# Patient Record
Sex: Female | Born: 1945
Health system: Southern US, Community
[De-identification: ages and names within clinical notes are randomized; demographics above are authoritative.]

## PROBLEM LIST (undated history)

## (undated) DIAGNOSIS — L821 Other seborrheic keratosis: Secondary | ICD-10-CM

## (undated) DIAGNOSIS — B269 Mumps without complication: Secondary | ICD-10-CM

## (undated) DIAGNOSIS — E1165 Type 2 diabetes mellitus with hyperglycemia: Secondary | ICD-10-CM

## (undated) DIAGNOSIS — R739 Hyperglycemia, unspecified: Secondary | ICD-10-CM

## (undated) DIAGNOSIS — B059 Measles without complication: Secondary | ICD-10-CM

## (undated) DIAGNOSIS — T7840XA Allergy, unspecified, initial encounter: Secondary | ICD-10-CM

## (undated) DIAGNOSIS — I10 Essential (primary) hypertension: Secondary | ICD-10-CM

## (undated) DIAGNOSIS — J189 Pneumonia, unspecified organism: Secondary | ICD-10-CM

## (undated) DIAGNOSIS — D649 Anemia, unspecified: Secondary | ICD-10-CM

## (undated) DIAGNOSIS — Z5189 Encounter for other specified aftercare: Secondary | ICD-10-CM

## (undated) DIAGNOSIS — E1169 Type 2 diabetes mellitus with other specified complication: Secondary | ICD-10-CM

## (undated) DIAGNOSIS — J019 Acute sinusitis, unspecified: Secondary | ICD-10-CM

## (undated) DIAGNOSIS — R21 Rash and other nonspecific skin eruption: Secondary | ICD-10-CM

## (undated) DIAGNOSIS — B019 Varicella without complication: Secondary | ICD-10-CM

## (undated) DIAGNOSIS — E669 Obesity, unspecified: Secondary | ICD-10-CM

## (undated) DIAGNOSIS — J45909 Unspecified asthma, uncomplicated: Secondary | ICD-10-CM

## (undated) DIAGNOSIS — Z0000 Encounter for general adult medical examination without abnormal findings: Secondary | ICD-10-CM

## (undated) DIAGNOSIS — E785 Hyperlipidemia, unspecified: Secondary | ICD-10-CM

## (undated) DIAGNOSIS — I81 Portal vein thrombosis: Secondary | ICD-10-CM

## (undated) DIAGNOSIS — K219 Gastro-esophageal reflux disease without esophagitis: Secondary | ICD-10-CM

## (undated) DIAGNOSIS — J4520 Mild intermittent asthma, uncomplicated: Secondary | ICD-10-CM

## (undated) HISTORY — DX: Hyperglycemia, unspecified: R73.9

## (undated) HISTORY — DX: Type 2 diabetes mellitus with other specified complication: E11.69

## (undated) HISTORY — DX: Mild intermittent asthma, uncomplicated: J45.20

## (undated) HISTORY — DX: Varicella without complication: B01.9

## (undated) HISTORY — DX: Mumps without complication: B26.9

## (undated) HISTORY — PX: BREAST SURGERY: SHX581

## (undated) HISTORY — DX: Obesity, unspecified: E66.9

## (undated) HISTORY — DX: Hyperlipidemia, unspecified: E78.5

## (undated) HISTORY — DX: Other seborrheic keratosis: L82.1

## (undated) HISTORY — PX: KNEE ARTHROSCOPY W/ MENISCAL REPAIR: SHX1877

## (undated) HISTORY — DX: Portal vein thrombosis: I81

## (undated) HISTORY — DX: Allergy, unspecified, initial encounter: T78.40XA

## (undated) HISTORY — PX: BREAST BIOPSY: SHX20

## (undated) HISTORY — PX: OTHER SURGICAL HISTORY: SHX169

## (undated) HISTORY — DX: Unspecified asthma, uncomplicated: J45.909

## (undated) HISTORY — DX: Rash and other nonspecific skin eruption: R21

## (undated) HISTORY — PX: TONSILLECTOMY: SUR1361

## (undated) HISTORY — DX: Essential (primary) hypertension: I10

## (undated) HISTORY — DX: Encounter for other specified aftercare: Z51.89

## (undated) HISTORY — DX: Measles without complication: B05.9

## (undated) HISTORY — DX: Anemia, unspecified: D64.9

## (undated) HISTORY — DX: Type 2 diabetes mellitus with hyperglycemia: E11.65

## (undated) HISTORY — DX: Gastro-esophageal reflux disease without esophagitis: K21.9

## (undated) HISTORY — DX: Acute sinusitis, unspecified: J01.90

## (undated) HISTORY — DX: Encounter for general adult medical examination without abnormal findings: Z00.00

---

## 1998-11-03 ENCOUNTER — Emergency Department (HOSPITAL_COMMUNITY): Admission: EM | Admit: 1998-11-03 | Discharge: 1998-11-03 | Payer: Self-pay | Admitting: Emergency Medicine

## 1998-12-04 ENCOUNTER — Encounter: Payer: Self-pay | Admitting: Family Medicine

## 1998-12-04 ENCOUNTER — Ambulatory Visit (HOSPITAL_COMMUNITY): Admission: RE | Admit: 1998-12-04 | Discharge: 1998-12-04 | Payer: Self-pay | Admitting: Family Medicine

## 1999-01-22 ENCOUNTER — Ambulatory Visit (HOSPITAL_COMMUNITY): Admission: RE | Admit: 1999-01-22 | Discharge: 1999-01-22 | Payer: Self-pay | Admitting: Family Medicine

## 1999-01-22 ENCOUNTER — Encounter: Payer: Self-pay | Admitting: Family Medicine

## 1999-07-30 ENCOUNTER — Encounter: Admission: RE | Admit: 1999-07-30 | Discharge: 1999-07-30 | Payer: Self-pay | Admitting: Family Medicine

## 1999-07-30 ENCOUNTER — Encounter: Payer: Self-pay | Admitting: Family Medicine

## 2001-11-12 ENCOUNTER — Emergency Department (HOSPITAL_COMMUNITY): Admission: EM | Admit: 2001-11-12 | Discharge: 2001-11-12 | Payer: Self-pay | Admitting: Emergency Medicine

## 2005-12-31 ENCOUNTER — Encounter: Admission: RE | Admit: 2005-12-31 | Discharge: 2005-12-31 | Payer: Self-pay | Admitting: Orthopedic Surgery

## 2006-02-21 ENCOUNTER — Encounter: Admission: RE | Admit: 2006-02-21 | Discharge: 2006-02-21 | Payer: Self-pay | Admitting: Orthopedic Surgery

## 2007-07-15 ENCOUNTER — Encounter: Admission: RE | Admit: 2007-07-15 | Discharge: 2007-07-15 | Payer: Self-pay | Admitting: Orthopedic Surgery

## 2007-09-27 ENCOUNTER — Other Ambulatory Visit: Admission: RE | Admit: 2007-09-27 | Discharge: 2007-09-27 | Payer: Self-pay | Admitting: Family Medicine

## 2008-04-13 LAB — HM PAP SMEAR

## 2008-04-13 LAB — HM MAMMOGRAPHY

## 2010-10-09 ENCOUNTER — Inpatient Hospital Stay (HOSPITAL_COMMUNITY)
Admission: EM | Admit: 2010-10-09 | Discharge: 2010-10-18 | DRG: 871 | Disposition: A | Discharge status: Home Health | Payer: Medicare Other | Attending: Internal Medicine | Admitting: Internal Medicine

## 2010-10-09 DIAGNOSIS — D638 Anemia in other chronic diseases classified elsewhere: Secondary | ICD-10-CM | POA: Diagnosis present

## 2010-10-09 DIAGNOSIS — N179 Acute kidney failure, unspecified: Secondary | ICD-10-CM | POA: Diagnosis present

## 2010-10-09 DIAGNOSIS — R609 Edema, unspecified: Secondary | ICD-10-CM | POA: Diagnosis not present

## 2010-10-09 DIAGNOSIS — K5732 Diverticulitis of large intestine without perforation or abscess without bleeding: Secondary | ICD-10-CM | POA: Diagnosis present

## 2010-10-09 DIAGNOSIS — D259 Leiomyoma of uterus, unspecified: Secondary | ICD-10-CM | POA: Diagnosis present

## 2010-10-09 DIAGNOSIS — R748 Abnormal levels of other serum enzymes: Secondary | ICD-10-CM | POA: Diagnosis present

## 2010-10-09 DIAGNOSIS — J45909 Unspecified asthma, uncomplicated: Secondary | ICD-10-CM | POA: Diagnosis present

## 2010-10-09 DIAGNOSIS — K449 Diaphragmatic hernia without obstruction or gangrene: Secondary | ICD-10-CM | POA: Diagnosis present

## 2010-10-09 DIAGNOSIS — K8020 Calculus of gallbladder without cholecystitis without obstruction: Secondary | ICD-10-CM | POA: Diagnosis present

## 2010-10-09 DIAGNOSIS — R791 Abnormal coagulation profile: Secondary | ICD-10-CM | POA: Diagnosis present

## 2010-10-09 DIAGNOSIS — I959 Hypotension, unspecified: Secondary | ICD-10-CM | POA: Diagnosis present

## 2010-10-09 DIAGNOSIS — E869 Volume depletion, unspecified: Secondary | ICD-10-CM | POA: Diagnosis present

## 2010-10-09 DIAGNOSIS — I81 Portal vein thrombosis: Secondary | ICD-10-CM | POA: Diagnosis present

## 2010-10-09 DIAGNOSIS — D509 Iron deficiency anemia, unspecified: Secondary | ICD-10-CM | POA: Diagnosis present

## 2010-10-09 DIAGNOSIS — E876 Hypokalemia: Secondary | ICD-10-CM | POA: Diagnosis not present

## 2010-10-09 DIAGNOSIS — A419 Sepsis, unspecified organism: Principal | ICD-10-CM | POA: Diagnosis present

## 2010-10-09 DIAGNOSIS — E119 Type 2 diabetes mellitus without complications: Secondary | ICD-10-CM | POA: Diagnosis present

## 2010-10-09 DIAGNOSIS — I10 Essential (primary) hypertension: Secondary | ICD-10-CM | POA: Diagnosis present

## 2010-10-09 DIAGNOSIS — E46 Unspecified protein-calorie malnutrition: Secondary | ICD-10-CM | POA: Diagnosis present

## 2010-10-09 LAB — COMPREHENSIVE METABOLIC PANEL
AST: 35 U/L (ref 0–37)
BUN: 25 mg/dL — ABNORMAL HIGH (ref 6–23)
CO2: 21 mEq/L (ref 19–32)
Calcium: 9.4 mg/dL (ref 8.4–10.5)
Chloride: 97 mEq/L (ref 96–112)
Creatinine, Ser: 1.45 mg/dL — ABNORMAL HIGH (ref 0.50–1.10)
GFR calc Af Amer: 44 mL/min — ABNORMAL LOW (ref >60)
GFR calc non Af Amer: 36 mL/min — ABNORMAL LOW (ref >60)
Glucose, Bld: 160 mg/dL — ABNORMAL HIGH (ref 70–99)
Total Bilirubin: 1 mg/dL (ref 0.3–1.2)

## 2010-10-09 LAB — CBC
MCH: 28.1 pg (ref 26.0–34.0)
MCV: 83.5 fL (ref 78.0–100.0)
Platelets: 288 10*3/uL (ref 150–400)
RDW: 13.8 % (ref 11.5–15.5)
WBC: 23 10*3/uL — ABNORMAL HIGH (ref 4.0–10.5)

## 2010-10-09 LAB — DIFFERENTIAL
Basophils Absolute: 0 10*3/uL (ref 0.0–0.1)
Eosinophils Absolute: 0 10*3/uL (ref 0.0–0.7)
Lymphocytes Relative: 5 % — ABNORMAL LOW (ref 12–46)
Monocytes Relative: 6 % (ref 3–12)
Neutro Abs: 20.4 10*3/uL — ABNORMAL HIGH (ref 1.7–7.7)
Neutrophils Relative %: 89 % — ABNORMAL HIGH (ref 43–77)

## 2010-10-10 ENCOUNTER — Inpatient Hospital Stay (HOSPITAL_COMMUNITY): Payer: Medicare Other

## 2010-10-10 LAB — URINALYSIS, ROUTINE W REFLEX MICROSCOPIC
Bilirubin Urine: NEGATIVE
Leukocytes, UA: NEGATIVE
Nitrite: NEGATIVE
Specific Gravity, Urine: 1.007 (ref 1.005–1.030)
Urobilinogen, UA: 1 mg/dL (ref 0.0–1.0)

## 2010-10-10 LAB — COMPREHENSIVE METABOLIC PANEL
ALT: 30 U/L (ref 0–35)
AST: 25 U/L (ref 0–37)
Albumin: 2 g/dL — ABNORMAL LOW (ref 3.5–5.2)
Calcium: 7.9 mg/dL — ABNORMAL LOW (ref 8.4–10.5)
Sodium: 137 mEq/L (ref 135–145)
Total Protein: 5.1 g/dL — ABNORMAL LOW (ref 6.0–8.3)

## 2010-10-10 LAB — CBC
MCH: 28.3 pg (ref 26.0–34.0)
MCHC: 33.7 g/dL (ref 30.0–36.0)
Platelets: 231 10*3/uL (ref 150–400)

## 2010-10-10 LAB — PROTIME-INR: INR: 1.32 (ref 0.00–1.49)

## 2010-10-10 MED ORDER — IOHEXOL 300 MG/ML  SOLN: 100.0000 mL | Freq: Once | INTRAMUSCULAR | Status: AC | PRN

## 2010-10-11 LAB — BASIC METABOLIC PANEL
CO2: 21 mEq/L (ref 19–32)
GFR calc non Af Amer: >60 mL/min (ref >60)
Glucose, Bld: 99 mg/dL (ref 70–99)
Potassium: 3.5 mEq/L (ref 3.5–5.1)
Sodium: 139 mEq/L (ref 135–145)

## 2010-10-11 LAB — IRON AND TIBC
Iron: 22 ug/dL — ABNORMAL LOW (ref 42–135)
Saturation Ratios: 17 % — ABNORMAL LOW (ref 20–55)
TIBC: 129 ug/dL — ABNORMAL LOW (ref 250–470)
UIBC: 107 ug/dL

## 2010-10-11 LAB — HOMOCYSTEINE: Homocysteine: 7.7 umol/L (ref 4.0–15.4)

## 2010-10-11 LAB — HEMOGLOBIN A1C
Hgb A1c MFr Bld: 7.1 % — ABNORMAL HIGH (ref <5.7)
Mean Plasma Glucose: 157 mg/dL — ABNORMAL HIGH (ref <117)

## 2010-10-11 LAB — HEPATITIS PANEL, ACUTE: Hep A IgM: NEGATIVE

## 2010-10-12 ENCOUNTER — Inpatient Hospital Stay (HOSPITAL_COMMUNITY): Payer: Medicare Other

## 2010-10-12 LAB — CBC
HCT: 26.3 % — ABNORMAL LOW (ref 36.0–46.0)
Hemoglobin: 9 g/dL — ABNORMAL LOW (ref 12.0–15.0)
MCH: 28.9 pg (ref 26.0–34.0)
MCHC: 34.2 g/dL (ref 30.0–36.0)
MCV: 84.6 fL (ref 78.0–100.0)

## 2010-10-12 LAB — BASIC METABOLIC PANEL
BUN: 6 mg/dL (ref 6–23)
Calcium: 8.1 mg/dL — ABNORMAL LOW (ref 8.4–10.5)
Creatinine, Ser: 0.69 mg/dL (ref 0.50–1.10)
GFR calc non Af Amer: >60 mL/min (ref >60)
Glucose, Bld: 146 mg/dL — ABNORMAL HIGH (ref 70–99)

## 2010-10-12 NOTE — H&P (Signed)
NAMEDELA, Vicki Dennis                ACCOUNT NO.:  [id number]  MEDICAL RECORD NO.:  [id number]  LOCATION:  1423                         FACILITY:  [facility]  PHYSICIAN:  Lonia Blood, M.D.      DATE OF BIRTH:  Jan 27, 1946  DATE OF ADMISSION:  10/09/2010 DATE OF DISCHARGE:                             HISTORY & PHYSICAL   PRIMARY CARE PHYSICIAN:  Renaye Rakers, MD  PRESENTING COMPLAINT:  Persistent nausea, vomiting, and diarrhea for 2 weeks.  HISTORY OF PRESENT ILLNESS:  The patient is a 65 year old female with a history of mainly asthma and anemia, who was apparently started having some nausea, vomiting, and diarrhea about 3 weeks ago on and off.  It started mainly with diarrhea and gradually she started vomiting.  This has persisted to the point where she is now vomiting and is unable to keep anything down.  She denied any recent antibiotic use and no recent sick contacts.  She is having abdominal cramping associated with it which is centrally located.  She was seen by her primary care physician about 5 days ago and she took Imodium which seemed to have initially helped the diarrhea, but it kept coming back.  She felt dry, dehydrated and came to the emergency room.  She had some mild dizziness, but did not pass out.  PAST MEDICAL HISTORY: 1. Anemia. 2. Asthma.  ALLERGIES:  SULFA.  CURRENT MEDICATIONS:  Mainly some allergy medicines.  SOCIAL HISTORY:  She lives in [place].  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 Denied tobacco, alcohol, or IV drug use.  FAMILY HISTORY:  Denied any significant family history.  REVIEW OF SYSTEMS:  All systems reviewed are currently negative except per HPI.  PHYSICAL EXAMINATION:  VITAL SIGNS:  Temperature 97.7, initial blood pressure is 78/50 with pulse 89, respiratory rate 16, saturation 100% on room air. GENERAL:  She is awake, alert, oriented, very pleasant woman, who is in no  acute distress. HEENT:  PERRL.  EOMI.  No pallor.  No jaundice.  No rhinorrhea.  She has dry mucous membrane. NECK:  Supple.  No visible JVD.  No lymphadenopathy. RESPIRATORY:  She has good air entry bilaterally.  No wheezes.  No rales.  No crackles. CARDIOVASCULAR SYSTEM:  She has S1 and S2.  No audible murmur. ABDOMEN:  Soft, full, nontender with positive bowel sounds. EXTREMITIES:  No edema, cyanosis, or clubbing. SKIN:  No rashes or ulcers. MUSCULOSKELETAL:  No significant joint swelling or tenderness.  LABORATORY DATA:  White count is 23,000 with a left shift, ANC of 20.4, hemoglobin 10.7 with platelet count of 288, MCV is 83.  Sodium is 133, potassium 3.9, chloride 97, CO2 of 21, glucose 160, BUN 25, creatinine 1.45, total bilirubin 1.0, alkaline phosphatase 183, AST 35, ALT 44, albumin is 2.7, total protein 6.8, and calcium 9.4.  ASSESSMENT:  This is a 65 year old female presenting with intractable nausea, vomiting, and then diarrhea.  Differentials are varied, this could be infectious gastroenteritis, but could also be some type ofcolitis.  C difficile cannot be entirely also discounted even though she had no recent antibiotic use or known sick contacts.  The  patient is also hypotensive, although she is responding to IV fluids.  She is dehydrated with acute renal failure and anemia.  PLAN: 1. Persistent nausea, vomiting, diarrhea.  We will admit the patient.     Rest her bowels, control her nausea, vomiting and diarrhea     symptomatically.  I will get stool for O and P and culture of the     stool for C difficile assay.  Depending on the results, we will     narrow down with treatment.  In the meantime, I will empirically     treat her for infectious diarrhea using Cipro and Flagyl. 2. Hypotension, more than likely from persistent nausea, vomiting,     diarrhea.  We will aggressively hydrate her with saline and follow     the blood pressure closely and put her on step-down  for that. 3. Dehydration.  Again, we will hydrate her with saline which will     restore her sodium as well. 4. Anemia.  This seems normocytic and chronic per the patient.  More     than likely, her hemoglobin will drop once she is hydrated.     Depending on how low it is, we will pursue workup or transfusion if     necessary. 5. Acute renal failure.  More than likely prerenal.  Continue with IV     rehydration. 6. History of asthma, is currently stable.  No evidence of asthma. 7. Protein-calorie malnutrition, probably from decreased oral intake     in the last couple of weeks.  Once she is able to start eating, we     will encourage food-protein intake.     Lonia Blood, M.D.     Verlin Grills  D:  10/10/2010  T:  10/10/2010  Job:  [id number]  Electronically Signed by Lonia Blood M.D. on 10/12/2010 12:51:25 AM

## 2010-10-13 LAB — LUPUS ANTICOAGULANT PANEL
DRVVT: 54.8 secs — ABNORMAL HIGH (ref 36.2–44.3)
PTT Lupus Anticoagulant: 53.9 secs — ABNORMAL HIGH (ref 30.0–45.6)
PTTLA Confirmation: 21 secs — ABNORMAL HIGH (ref <8.0)

## 2010-10-13 LAB — GLUCOSE, CAPILLARY: Glucose-Capillary: 109 mg/dL — ABNORMAL HIGH (ref 70–99)

## 2010-10-13 LAB — PROTIME-INR: INR: 2 — ABNORMAL HIGH (ref 0.00–1.49)

## 2010-10-13 LAB — BASIC METABOLIC PANEL
CO2: 20 mEq/L (ref 19–32)
Chloride: 111 mEq/L (ref 96–112)
Sodium: 138 mEq/L (ref 135–145)

## 2010-10-13 LAB — ANTITHROMBIN III: AntiThromb III Func: 130 % — ABNORMAL HIGH (ref 76–126)

## 2010-10-14 LAB — BETA-2-GLYCOPROTEIN I ABS, IGG/M/A
Beta-2 Glyco I IgG: 0 G Units (ref <20)
Beta-2-Glycoprotein I IgM: 3 M Units (ref <20)

## 2010-10-14 LAB — GLUCOSE, CAPILLARY: Glucose-Capillary: 100 mg/dL — ABNORMAL HIGH (ref 70–99)

## 2010-10-14 LAB — BASIC METABOLIC PANEL
CO2: 22 mEq/L (ref 19–32)
Chloride: 109 mEq/L (ref 96–112)
Creatinine, Ser: 0.56 mg/dL (ref 0.50–1.10)

## 2010-10-14 LAB — CBC
Hemoglobin: 9.4 g/dL — ABNORMAL LOW (ref 12.0–15.0)
MCH: 28.7 pg (ref 26.0–34.0)
RBC: 3.28 MIL/uL — ABNORMAL LOW (ref 3.87–5.11)

## 2010-10-14 LAB — CARDIOLIPIN ANTIBODIES, IGG, IGM, IGA: Anticardiolipin IgM: 1 MPL U/mL — ABNORMAL LOW (ref <11)

## 2010-10-14 LAB — AMYLASE: Amylase: 41 U/L (ref 0–105)

## 2010-10-14 LAB — OVA AND PARASITE EXAMINATION: Ova and parasites: NONE SEEN

## 2010-10-14 LAB — PROTIME-INR: Prothrombin Time: 19.6 seconds — ABNORMAL HIGH (ref 11.6–15.2)

## 2010-10-15 LAB — CBC
HCT: 27.2 % — ABNORMAL LOW (ref 36.0–46.0)
Hemoglobin: 9.2 g/dL — ABNORMAL LOW (ref 12.0–15.0)
MCHC: 33.8 g/dL (ref 30.0–36.0)

## 2010-10-15 LAB — GLUCOSE, CAPILLARY
Glucose-Capillary: 76 mg/dL (ref 70–99)
Glucose-Capillary: 96 mg/dL (ref 70–99)

## 2010-10-15 LAB — PROTIME-INR: INR: 2.04 — ABNORMAL HIGH (ref 0.00–1.49)

## 2010-10-16 LAB — STOOL CULTURE

## 2010-10-16 LAB — COMPREHENSIVE METABOLIC PANEL
BUN: 7 mg/dL (ref 6–23)
Calcium: 7.6 mg/dL — ABNORMAL LOW (ref 8.4–10.5)
Creatinine, Ser: 0.49 mg/dL — ABNORMAL LOW (ref 0.50–1.10)
GFR calc Af Amer: >60 mL/min (ref >60)
GFR calc non Af Amer: >60 mL/min (ref >60)
Glucose, Bld: 77 mg/dL (ref 70–99)
Total Protein: 5.1 g/dL — ABNORMAL LOW (ref 6.0–8.3)

## 2010-10-16 LAB — GLUCOSE, CAPILLARY
Glucose-Capillary: 96 mg/dL (ref 70–99)
Glucose-Capillary: 86 mg/dL (ref 70–99)

## 2010-10-16 LAB — CBC
MCH: 28.8 pg (ref 26.0–34.0)
MCHC: 34.7 g/dL (ref 30.0–36.0)
MCV: 82.7 fL (ref 78.0–100.0)
Platelets: 235 10*3/uL (ref 150–400)
RDW: 14.9 % (ref 11.5–15.5)
WBC: 15.3 10*3/uL — ABNORMAL HIGH (ref 4.0–10.5)

## 2010-10-17 LAB — GLUCOSE, CAPILLARY
Glucose-Capillary: 104 mg/dL — ABNORMAL HIGH (ref 70–99)
Glucose-Capillary: 109 mg/dL — ABNORMAL HIGH (ref 70–99)
Glucose-Capillary: 176 mg/dL — ABNORMAL HIGH (ref 70–99)
Glucose-Capillary: 94 mg/dL (ref 70–99)
Glucose-Capillary: 95 mg/dL (ref 70–99)
Glucose-Capillary: 94 mg/dL (ref 70–99)
Glucose-Capillary: 114 mg/dL — ABNORMAL HIGH (ref 70–99)
Glucose-Capillary: 76 mg/dL (ref 70–99)

## 2010-10-17 LAB — CULTURE, BLOOD (ROUTINE X 2): Culture: NO GROWTH

## 2010-10-17 LAB — CBC
Hemoglobin: 8.7 g/dL — ABNORMAL LOW (ref 12.0–15.0)
MCH: 28.8 pg (ref 26.0–34.0)
MCV: 83.4 fL (ref 78.0–100.0)
RBC: 3.02 MIL/uL — ABNORMAL LOW (ref 3.87–5.11)
WBC: 14.6 10*3/uL — ABNORMAL HIGH (ref 4.0–10.5)

## 2010-10-17 LAB — BASIC METABOLIC PANEL
CO2: 24 mEq/L (ref 19–32)
Calcium: 7.7 mg/dL — ABNORMAL LOW (ref 8.4–10.5)
Chloride: 108 mEq/L (ref 96–112)
Glucose, Bld: 69 mg/dL — ABNORMAL LOW (ref 70–99)
Sodium: 137 mEq/L (ref 135–145)

## 2010-10-17 LAB — PROTHROMBIN GENE MUTATION

## 2010-10-17 LAB — ANA: Anti Nuclear Antibody(ANA): NEGATIVE

## 2010-10-18 LAB — GLUCOSE, CAPILLARY
Glucose-Capillary: 147 mg/dL — ABNORMAL HIGH (ref 70–99)
Glucose-Capillary: 71 mg/dL (ref 70–99)

## 2010-10-18 LAB — PROTIME-INR: Prothrombin Time: 23.7 seconds — ABNORMAL HIGH (ref 11.6–15.2)

## 2010-10-18 NOTE — Discharge Summary (Signed)
Vicki Dennis, Vicki Dennis                ACCOUNT NO.:  [id number]  MEDICAL RECORD NO.:  [id number]  LOCATION:  1423                         FACILITY:  [facility]  PHYSICIAN:  Hillery Aldo, M.D.   DATE OF BIRTH:  1945/12/09  DATE OF ADMISSION:  10/09/2010 DATE OF DISCHARGE:  10/18/2010                              DISCHARGE SUMMARY   PRIMARY CARE PHYSICIAN:  Renaye Rakers, M.D.  HEMATOLOGIST:  [name], M.D.  DISCHARGE DIAGNOSES: 1. Portal vein thrombosis with positive lupus anticoagulant.  The     patient is to follow up with Dr. Myna Hidalgo as an outpatient to have a     repeat lupus anticoagulant done in 3 months. 2. Diverticulitis with diarrhea. 3. Hypotension/sepsis syndrome, resolved. 4. New onset type 2 diabetes. 5. Hypokalemia. 6. Mixed anemia, chronic disease and iron deficiency, guaiac negative.     Outpatient followup recommended. 7. Volume depletion. 8. Acute renal failure, resolved. 9. Anasarca. 10.History of hypertension. 11.History of asthma. 12.History of anemia. 13.Cholelithiasis. 14.Hiatal hernia. 15.Enlarged fibroid uterus.  DISCHARGE MEDICATIONS: 1. Cipro 500 mg p.o. b.i.d. x4 more days. 2. Ferrous sulfate 325 mg p.o. b.i.d. 3. Glimepiride 1 mg p.o. q.a.c. breakfast. 4. Metronidazole 500 mg p.o. t.i.d. x4 more days. 5. Potassium chloride 20 mEq p.o. t.i.d. 6. Warfarin 5 mg p.o. daily or as directed by PCP. 7. Bayer aspirin 81 mg p.o. q.4 h p.r.n. fever. 8. Micardis HCT 80/25 one tablet p.o. daily. 9. Multivitamin 1 tablet p.o. daily. 10.Symbicort 80/4.5 one puff inhaled daily. 11.Tylenol oral solution 2 tablespoons p.o. q.6 h p.r.n. 12.Zyrtec 10 mg p.o. daily p.r.n. sinus issues.  CONSULTATIONS: 1. Inpatient diabetes coordinator. 2. Physical Therapy. 3. Occupational Therapy. 4. Pharmacy for Coumadin dosing.  BRIEF ADMISSION HPI:  The patient is a 65 year old female who presented to the hospital with a 2-week history of persistent nausea,  vomiting, diarrhea.  Her symptoms had progressed to the point where she was unable to keep any oral foods and liquids down.  She had denied any recent antibiotic use and had no sick contacts.  She initially presented to her primary care physician's office and was symptomatically treated with Imodium which transiently helped but when her symptoms recurred, she presented to the emergency department for further evaluation.  Upon initial evaluation in the emergency department, a CT scan of the abdomen and pelvis were done which showed a portal vein thrombosis and diverticulitis.  She subsequently was referred to the hospitalist service for further evaluation and treatment.  For full details, please see the dictated report done by Dr. Mikeal Hawthorne.  PROCEDURES AND DIAGNOSTIC STUDIES: 1. CT scan of the abdomen and pelvis on October 10, 2010, showed portal     vein thrombosis along with branch vessel thrombophlebitis.  This     could be the sequelae of resolving diverticulitis involving the     left colon.  Dilated tubular cystic structure running along the     left anterior pararenal fascia, most likely a dilated thrombosed     pain.  No abscess.  Cholelithiasis.  No findings for pancreatitis.     Hiatal hernia.  Enlarged fibroid uterus. 2. Abdominal ultrasound on October 12, 2010 showed cholelithiasis with  no     evidence for biliary dilatation.  A 1.5 cm echogenic structure in     the right hepatic lobe, indeterminate, but most likely     representative of a small cavernous hemangioma.  Small echogenic     focus in the right kidney which could represent a small     angiomyolipoma based on the prior cross-sectional imaging.  Main     portal vein was patent.  Bilateral pleural effusions. 3. Two-dimensional echocardiogram done on October 16, 2010, showed normal     systolic function with ejection fraction estimated at 55-60%.     Regional wall motion was normal.  DISCHARGE LABORATORY VALUES:  PT was 23.7 with  an INR of 2.07. Hypercoagulable panel showed an antithrombin III level of 130, protein C of 66, protein C functional of 72, total protein S of 100, protein S functional 76, PTT LA 53.9, PTT LA confirmation 21, PTT LA 4:1 mix 52.3, DRVVT 54.8, DRVVT 1:1 mix 41.6.  Lupus anticoagulant was tested. Homocystine was 7.7.  Beta II glycoprotein IgG was 0, beta II glycoprotein I IgM was 3, beta II glycoprotein I IgA was 3, cardiolipin antibody IgG was 32, cardiolipin antibody IgM was 1, a cardiolipin antibody IgA was 4.  ANA was negative.  Blood cultures were negative. Sodium was 137, potassium 3.8, chloride 108, bicarb 24, BUN 12, creatinine 0.52, glucose 69, calcium 7.7.  White blood cell count was 14.6, hemoglobin 8.7, hematocrit 25.2, platelets 233.  Stool cultures were negative with reduced normal flora present.  Liver function studies showed an alkaline phosphatase 151, AST 45, ALT 50, total protein 5.1, albumin 1.8.  JAK-2 genotype was not detected.  Ova and parasite exam was negative.  Hemoglobin A1c was 7.1.  Acute hepatitis panel was negative.  Clostridium difficile by PCR was negative.  Iron 122, total iron binding capacity 129, 17% saturation, urine iron binding capacity 107, vitamin B12 1208, serum folate 12.5, ferritin 587.  TSH was 0.611. Fecal occult blood testing was negative.  HOSPITAL COURSE BY PROBLEMS: 1. Portal vein thrombosis with positive lupus anticoagulant     incidentally noted on CT scan of the abdomen and pelvis.  A     subsequent ultrasound did show that the main portal vein was patent     but subsequent branches were found to have thrombus per discussion     with the radiologist.  A hypercoagulable panel was done and came     back with a positive lupus anticoagulant along with the other     abnormalities as noted above.  A telephone consult was held with     Dr. Myna Hidalgo and the patient was discussed with him.  He recommended     doing a JAK-2 genotype as well as  flow cytometry for [facility] and these     were ordered.  The JAK-2 level came back negative.  Flow cytometry     is pending at the time of this dictation.  It was in Dr. Myna Hidalgo     opinion that acutely ill patient could have a false positive lupus     anticoagulant but he did recommend 3 months of therapy with     Coumadin and an outpatient followup with rechecking the lupus     anticoagulant in the outpatient environment in 3 months time for     further management based on the results of this test.  If it     remains positive, she may require lifelong Coumadin.  The patient     does have followup with Dr. Myna Hidalgo that is scheduled and the     office will call her with an appointment time.  The patient's INR     is currently therapeutic and she will be discharged on Coumadin     with Coumadin management per her primary care physician.  Care     manager has arranged for appropriate followup time and a home     health nurse will draw a daily PT/INR and fax these results to Dr.     Tedra Senegal office. 2. Diverticulitis with diarrhea:  The patient did have findings on CT     scan indicative of resolving diverticulitis.  She has been on Cipro     and Flagyl for 10 days and will complete an additional 4 days of     therapy for total treatment course of 14 days.  She also had a     sepsis syndrome which gradually resolved with fluid resuscitation     and antibiotic therapy.  She was initially put on bowel rest and     her diet gradually advanced.  She should follow up with     gastroenterologist for possible colonoscopy given her chronic     anemia and history of portal vein thrombosis. 3. Sepsis syndrome:  Resolved with IV fluids and antibiotics.  The     patient's discharge blood pressure is stable at 131/79. 4. Type 2 diabetes:  This is new onset.  The patient's glycemic     control has been good on monotherapy with Amaryl.  We have written     her a prescription for glucometer to check her blood  glucoses twice     a day and instructed her to keep a log of these readings so that     she can bring this to her next appointment with Dr. Parke Simmers.  She     will also be set up with outpatient diabetes education and was     given extensive teaching while in the hospital. 5. Hypokalemia:  The patient's potassium was fully replaced and she     will be discharged on supplementation potassium at discharge. 6. Mixed anemia:  The patient has a component of anemia of chronic     disease and iron deficiency.  Her stool was guaiac negative.  This     should be followed up as an outpatient. 7. Volume depletion:  The patient was fluid volume resuscitated.  Her     BUN to creatinine ratio is good at discharge. 8. Acute renal failure:  The patient's renal failure has completely     resolved. 9. History of hypertension:  The patient can safely resume her     antihypertensives at discharge. 10.History of asthma:  The patient can continue on Symbicort. 11.Hiatal hernia:  The patient is currently asymptomatic. 12.Fibroids:  The patient to follow up with primary care physician if     indicated. 13.Cholelithiasis:  The patient did have mild elevation of her LFTs     but no findings on exam or on ultrasound to suggest obstructive     cholelithiasis.  DISPOSITION:  The patient is medically stable and discharged home.  CONDITION ON DISCHARGE:  Improved.  DISCHARGE INSTRUCTIONS:  ACTIVITY:  Increase activity slowly.  May walk up steps and shower as well as bathe.  DIET:  Low-sodium, heart-healthy, diabetic.  The patient instructed to check her blood glucoses as instructed above.  FOLLOWUP APPOINTMENTS:  Follow  up Dr. Myna Hidalgo.  The office will call with an appointment time.  Follow up with the diabetes nutrition center once home health stops.  Follow up with Dr. Parke Simmers on October 21, 2010 at 2 p.m. Home health nurses will draw daily PT/INR and fax these to Dr. Parke Simmers for review and adjustment of her  Coumadin dosing.  Time spent on discharge including face-to-face time equals approximately 35 minutes.     Hillery Aldo, M.D.     CR/MEDQ  D:  10/18/2010  T:  10/18/2010  Job:  [id number]  cc:   Renaye Rakers, M.D. Fax: [phone]  [name], M.D. Fax: [phone]  Electronically Signed by Hillery Aldo M.D. on 10/18/2010 03:58:15 PM

## 2010-10-26 NOTE — Progress Notes (Signed)
Vicki Dennis, Vicki Dennis                ACCOUNT NO.:  [id number]  MEDICAL RECORD NO.:  [id number]  LOCATION:  1423                         FACILITY:  [facility]  PHYSICIAN:  Kela Millin, M.D.DATE OF BIRTH:  [birth year]                                PROGRESS NOTE   CURRENT DIAGNOSES: 1. Portal vein thrombosis with positive lupus anticoagulant.  The     patient to follow up outpatient to have a repeat lupus     anticoagulant in 3 months, follow up with Dr. Myna Dennis outpatient. 2. Probable diverticulitis/diarrheal illness. 3. Hypotension/sepsis syndrome, clinically improved. 4. Diabetes mellitus, new onset. 5. Hypokalemia; potassium replaced. 6. Mixed anemia, chronic disease and iron deficiency, guaiac-negative.     Follow up outpatient. 7. Volume depletion. 8. Acute renal failure, resolved. 9. History of hypertension. 10.History of asthma. 11.History of anemia.  PROCEDURES AND STUDIES: 1. Abdominal CT scan:  Portal vein thrombosis along with branch     vessels thrombophlebitis.  This could be the sequela of resolving     diverticulitis involving the left colon.  Dilated tubular cystic     structure running along the left anterior pararenal fascia, most     likely a dilated thrombosed vein.  No abscess.  Cholelithiasis.  No     findings for pancreatitis.  Hiatal hernia.  Enlarged fibroid     uterus. 2. Abdominal ultrasound on 07/01:  5 cm echogenic structure in the     right hepatic lobe.  This finding is indeterminate but most likely     represents a small cavernous hemangioma.  Small echogenic focus in     the right kidney could represent a small angiomyolipoma based on     the prior cross-sectional imaging.  The main portal vein is patent.     Bilateral pleural effusions.  Cholelithiasis.  No evidence for     biliary dilatation. 3. JAK2 genotype not detected.  Lupus anticoagulant detected.  BRIEF HISTORY: The patient is a 65 year old female with the above listed  medical problems who presented with persistent nausea, vomiting and diarrhea for 2 weeks.  She stated that the symptoms worsened to where she was unable to keep anything down.  She denied any recent antibiotic use and no sick contacts.  She admitted to associated abdominal cramping.  She saw her primary care physician and took Imodium which seemed to help initially but then it recurred.  She felt dehydrated and came to the ED.  She reported having some dizziness but no loss of consciousness.  She was admitted for further evaluation and management.  HOSPITAL COURSE: 1. Probable diverticulitis:  As discussed above, she presented with     nausea, vomiting, diarrhea and abdominal cramping.  A CT scan was     done in the ED and it was noted that there was a probable resolving     diverticulitis associated with the left colon.  The patient was     noted to have a markedly elevated white cell count on admission of     23,000 and she was started on empiric antibiotics with Cipro and     Flagyl.  Her symptoms gradually improved.  She was placed on  clear    liquids on admission, and as she continued to improve, her diet was     advanced.  Her leukocytosis is improving, 14.4 today.  Plan is to     follow, recheck and the patient to follow up outpatient with     Gastroenterology for possible colonoscopy when over this episode.     The patient had stool studies done and came back negative for C     diff, and the cultures were also negative. 2. Hypotension/sepsis syndrome:  The impression was that this was     secondary to hypovolemia as she was volume depleted as well as to     an infection above.  Blood cultures were obtained on admission and     they did not grow any bacteria.  Stool cultures were obtained and     came back with no suspicious colonies, and the C diff PCR came back     negative.  The patient was bolused with IV fluids, treated with     empiric antibiotics as above and with that her  hypotension     resolved.  Her blood pressures are still soft at this time in the     low 100s and this is off antihypertensives which she is usually on     outpatient. 3. Portal vein thrombosis:  This was noted on the CT scan of her     abdomen and pelvis done in the ED.  The patient did have an     abdominal ultrasound done which showed that the main portal vein     was patent and I discussed the CT findings as well as the     ultrasound findings with the Radiologist.  He did indicate that     they were looking at different parts of the portal vein and that     the whole portal vein was not thrombosed and so the part of it that     was noted on the CT scan of the abdomen and pelvis was the     thrombosed part.  A hypercoagulable panel was done and it came back     with a positive lupus anticoagulant.  I did a phone consult with     Dr. Myna Dennis and discussed the patient with him.  He recommended     doing a JAK2 genotype as well as flow cytometry for PNH and these     were done.  The JAK2 level came back negative and the flow     cytometry for [facility] is pending at this time.  I also discussed the     lupus anticoagulant which was positive with Dr. Myna Dennis and he     stated that sometimes in acutely ill patients this could be a false     positive and so he recommended to keep the patient on     anticoagulation with Coumadin for at least 3 months and to repeat     the lupus anticoagulant in 3 months and then further manage     accordingly.  His recommendation was that if her workup ends up     being all negative, then all she would require would be 3 months of     anticoagulation with Coumadin.  But if there is an underlying     etiology found like the repeat lupus anticoagulant still being     positive, then she will need to be on long-term anticoagulation.     As  noted above, the flow cytometry for P[facility] is pending at the time     of this dictation.  She has been on Lovenox, Coumadin and her  INR     today is 1.63. 4. Mixed anemia:  The patient was noted to have a normocytic anemia on     admission and an anemia panel done showed an iron level of 22 and     her vitamin B12 level was normal at 1208 with a normal folate of     12.5.  The patient had a Hemoccult done and it came back negative.     She has been started on iron supplementation.  Her hemoglobin today     is stable at 9.4.  She has not had any evidence of gross bleeding.     She is to follow up outpatient. 5. Hypokalemia:  Her potassium was replaced in the hospital. 6. Volume depletion/acute renal failure:  This resolved with     hydration.  Her last creatinine was 0.51 with a BUN of 6. 7. Elevated alkaline phosphatase:  The patient's LFTs on admission     revealed an alk phos of 148.  Abdominal ultrasound was done and the     results are as stated above, with cholelithiasis noted.  The     patient did not have any right upper quadrant pain or tenderness.     A hepatitis panel was done and this came back negative. 8. New-onset diabetes mellitus:  The patient's blood glucose on     admission was elevated and her Accu-Cheks were monitored and she     had persistently elevated blood sugars, and so a hemoglobin A1c was     done and was elevated at 7.1.  The patient has been receiving     inpatient diabetes teaching and has been started on Amaryl and her     blood sugars are better controlled at this time. 9. History of hypertension:  Secondary to her hypotension, her     outpatient antihypertensives have been on hold. 10.History of asthma, stable:  She was restarted on her Symbicort this     hospital stay. 11.The discharging hospitalist to dictate the rest of her hospital     stay as well as the final discharge medications.     Kela Millin, M.D.     ACV/MEDQ  D:  10/14/2010  T:  10/14/2010  Job:  [id number]  Electronically Signed by Donnalee Curry M.D. on 10/26/2010 07:51:00 AM

## 2010-10-28 ENCOUNTER — Ambulatory Visit: Payer: Medicare Other | Admitting: Hematology & Oncology

## 2010-11-05 ENCOUNTER — Encounter: Payer: Self-pay | Admitting: *Deleted

## 2010-11-05 ENCOUNTER — Encounter: Payer: Medicare Other | Attending: Family Medicine | Admitting: *Deleted

## 2010-11-05 DIAGNOSIS — I10 Essential (primary) hypertension: Secondary | ICD-10-CM

## 2010-11-05 DIAGNOSIS — E1169 Type 2 diabetes mellitus with other specified complication: Secondary | ICD-10-CM | POA: Insufficient documentation

## 2010-11-05 DIAGNOSIS — Z713 Dietary counseling and surveillance: Secondary | ICD-10-CM | POA: Insufficient documentation

## 2010-11-05 DIAGNOSIS — IMO0002 Reserved for concepts with insufficient information to code with codable children: Secondary | ICD-10-CM

## 2010-11-05 DIAGNOSIS — E1165 Type 2 diabetes mellitus with hyperglycemia: Secondary | ICD-10-CM

## 2010-11-05 DIAGNOSIS — E669 Obesity, unspecified: Secondary | ICD-10-CM | POA: Insufficient documentation

## 2010-11-05 DIAGNOSIS — E119 Type 2 diabetes mellitus without complications: Secondary | ICD-10-CM | POA: Insufficient documentation

## 2010-11-05 DIAGNOSIS — E785 Hyperlipidemia, unspecified: Secondary | ICD-10-CM

## 2010-11-05 HISTORY — DX: Type 2 diabetes mellitus with other specified complication: E11.69

## 2010-11-05 HISTORY — DX: Reserved for concepts with insufficient information to code with codable children: IMO0002

## 2010-11-05 HISTORY — DX: Type 2 diabetes mellitus with other specified complication: E66.9

## 2010-11-05 HISTORY — DX: Type 2 diabetes mellitus with hyperglycemia: E11.65

## 2010-11-05 NOTE — Progress Notes (Signed)
Diabetes Education:  Appt start time: 0900 end time:  1030.  Assessment:  Primary concerns today: Type 2 Diabetes Management. Pt believes she "may not have diabetes".  Patient was seen on 11/05/2010 for the first of a series of three diabetes self-management courses at [facility]. *Note class content was taught to pt in a 1:1 setting. The following learning objectives were met by the patient during this course:   Defines diabetes and the role of insulin  Identifies type of diabetes and pathophysiology  States normal BG range and personal goals  Identifies three risk factors for the development of diabetes  States the need for and frequency of healthcare follow up (ADA Standards of Care)  MEDICATIONS: Pt on Amaryl for diabetes. See updated med list.   DIETARY INTAKE:  Usual eating pattern includes 3 meals and 2 snacks per day.  Everyday foods include whole grains, fruits, vegetables, vegetarian meats, lean meat and fish.  Avoided foods include concentrated sugar.    CBG monitoring: QD CBG range: 110-150  Lab Results  Component Value Date   HGBA1C 7.1* 10/11/2010   Usual physical activity: Light walking. Limited per pt due to coumadin levels.  Estimated energy needs: 1400-1600 calories <180 g carbohydrates 75-90 g protein 50 g fat  Patient has established the following initial goals:  Increase exercise levels  Follow Diabetes Meal Plan  Take medications appropriately  Progress Towards Goal(s):  In progress.   Nutritional Diagnosis:  [place]-2.1 Inpaired nutrition utilization As related to hyperglycemia.  As evidenced by pt with elevated A1c level of 7.1%.    Intervention:    Follow Diabetes Meal Plan as instructed  Eat 3 meals and 2 snacks, every 3-5 hrs  Limit carbohydrate intake to <45 grams carbohydrate/meal  Limit carbohydrate intake to 15 grams carbohydrate/snack  Add lean protein foods to meals/snacks  Monitor glucose  levels as instructed by your doctor  Aim for 30 mins of physical activity daily  Bring food record and glucose log to your next nutrition visit  Monitoring/Evaluation:  Dietary intake, exercise, carbohydrate counting, CBG's, and body weight and follow prn.

## 2010-11-05 NOTE — Patient Instructions (Signed)
Goals:  Follow Diabetes Meal Plan as instructed  Eat 3 meals and 2 snacks, every 3-5 hrs  Limit carbohydrate intake to <45 grams carbohydrate/meal  Limit carbohydrate intake to 15 grams carbohydrate/snack  Add lean protein foods to meals/snacks  Monitor glucose levels as instructed by your doctor  Aim for 30 mins of physical activity daily

## 2010-12-08 ENCOUNTER — Other Ambulatory Visit: Payer: Self-pay | Admitting: Hematology & Oncology

## 2010-12-08 ENCOUNTER — Encounter (HOSPITAL_BASED_OUTPATIENT_CLINIC_OR_DEPARTMENT_OTHER): Payer: Medicare Other | Admitting: Hematology & Oncology

## 2010-12-08 DIAGNOSIS — I82220 Acute embolism and thrombosis of inferior vena cava: Secondary | ICD-10-CM

## 2010-12-08 LAB — CBC WITH DIFFERENTIAL (CANCER CENTER ONLY)
BASO#: 0.1 10e3/uL (ref 0.0–0.2)
BASO%: 1.2 % (ref 0.0–2.0)
EOS%: 6.1 % (ref 0.0–7.0)
Eosinophils Absolute: 0.4 10e3/uL (ref 0.0–0.5)
HCT: 35.7 % (ref 34.8–46.6)
HGB: 12.3 g/dL (ref 11.6–15.9)
LYMPH#: 2.5 10e3/uL (ref 0.9–3.3)
LYMPH%: 41.8 % (ref 14.0–48.0)
MCH: 29.8 pg (ref 26.0–34.0)
MCHC: 34.5 g/dL (ref 32.0–36.0)
MCV: 86 fL (ref 81–101)
MONO#: 0.5 10e3/uL (ref 0.1–0.9)
MONO%: 7.9 % (ref 0.0–13.0)
NEUT#: 2.6 10e3/uL (ref 1.5–6.5)
NEUT%: 43 % (ref 39.6–80.0)
Platelets: 224 10e3/uL (ref 145–400)
RBC: 4.13 10e6/uL (ref 3.70–5.32)
RDW: 15.6 % (ref 11.1–15.7)
WBC: 5.9 10e3/uL (ref 3.9–10.0)

## 2010-12-08 LAB — PROTIME-INR (CHCC SATELLITE)
INR: 1.8 — ABNORMAL LOW (ref 2.0–3.5)
Protime: 21.6 s — ABNORMAL HIGH (ref 10.6–13.4)

## 2010-12-10 LAB — CARDIOLIPIN ANTIBODIES, IGG, IGM, IGA
Anticardiolipin IgG: 4 GPL U/mL (ref <23)
Anticardiolipin IgM: 8 MPL U/mL (ref <11)

## 2010-12-10 LAB — BETA-2 GLYCOPROTEIN ANTIBODIES
Beta-2-Glycoprotein I IgA: 3 A Units (ref <20)
Beta-2-Glycoprotein I IgM: 4 M Units (ref <20)

## 2010-12-10 LAB — LUPUS ANTICOAGULANT PANEL
PTT Lupus Anticoagulant: 49.3 secs — ABNORMAL HIGH (ref 30.0–45.6)
PTTLA 4:1 Mix: 40.5 secs (ref 30.0–45.6)

## 2010-12-22 ENCOUNTER — Encounter: Payer: Medicare Other | Admitting: Hematology & Oncology

## 2010-12-22 ENCOUNTER — Other Ambulatory Visit: Payer: Self-pay | Admitting: Hematology & Oncology

## 2010-12-22 LAB — PROTIME-INR (CHCC SATELLITE): INR: 2.1 (ref 2.0–3.5)

## 2010-12-24 LAB — D-DIMER, QUANTITATIVE: D-Dimer, Quant: 0.45 ug/mL-FEU (ref 0.00–0.48)

## 2011-01-06 ENCOUNTER — Encounter: Payer: Medicare Other | Attending: Family Medicine | Admitting: *Deleted

## 2011-01-06 DIAGNOSIS — Z713 Dietary counseling and surveillance: Secondary | ICD-10-CM | POA: Insufficient documentation

## 2011-01-06 DIAGNOSIS — E119 Type 2 diabetes mellitus without complications: Secondary | ICD-10-CM | POA: Insufficient documentation

## 2011-01-06 NOTE — Patient Instructions (Signed)
Goals:  Follow Diabetes Meal Plan as instructed  Eat 3 meals and 2 snacks, every 3-5 hrs  Limit carbohydrate intake to 45 grams carbohydrate/meal Limit carbohydrate intake to 15-20 grams carbohydrate/snack Add lean protein foods to meals/snacks  Monitor glucose levels as instructed by your doctor  Aim for 15-30 mins of physical activity daily  Bring food record and glucose log to your next nutrition visit    

## 2011-01-06 NOTE — Progress Notes (Signed)
  Medical Nutrition Therapy:  Appt start time: 0930 end time:  1000.  Assessment:  Primary concerns today: type 2 diabetes management. Pt has not had any recent labs drawn. She continues to report good dietary intake and carbohydrate counting practices. She aims for <45 grams per meal and <15 grams per snack (1-2 times per day). She regulates her glucose levels closely and tracks how her glucose levels change based off of what she eats, what time she eats, and if she skips meals. She has an excellent understanding of diabetes/diet and a drive to continue to control her glucose levels. Pt monitors the vitamin K levels in her food due to her coumadin use. She monitors sodium levels very closely in foods and shoots for <2000 mg sodium.  MEDICATIONS: No recent changes. Pt is being prescribed Amaryl yet does not take it as she does not believe her glucose levels are not elevated enough to take medications.  DIETARY INTAKE:  24-hr recall:  B (7-8 AM): 1/2 potato, olive oil, green pepper, 1-2 oz ham, 2 egg omelet, 1 oz low sodium cheese  OR Flax cereal with almond milk, 1/2 banana and egg (<45 g carb) Snk (AM) : N/A  L (12-1 PM): Grilled [place] or Grilled cheese sandwiches (low sodium meat/cheese), whole grain bread Snk (PM): 2 tbsp peanut butter, 2-4 crackers D (6-7 PM): Thin pork chop OR Chicken breast (3-4 oz) w/ green beans and corn (1 cup each)  Snk (8-9 PM): peanut butter w/ 2 crackers (occasionally) Beverages: water, acai berry juice (limits due to high sugar), diet sodas, light beer (1/week)  Recent physical activity: Pt reports that she walks as much as she can yet reports limited "structured exercise".  Estimated energy needs: 1500-1600 calories 180 g carbohydrates 110 g protein 50 g fat  CBG checks: Twice daily CBG ranges: 95-120 (fasting), 120-150 (2 hrs after)  Lab Results  Component Value Date   HGBA1C 7.1* 10/11/2010    Progress Towards Goal(s):  In progress.   Nutritional  Diagnosis:  [place]-2.1 Inpaired nutrition utilization As related to impaired glucose tolerance.  As evidenced by elevated glucose levels in AM/2 hrs after meals.    Intervention:  Nutrition education.  Monitoring/Evaluation:  Dietary intake, exercise, glucose levels, and body weight in 3 month(s).

## 2011-01-12 ENCOUNTER — Other Ambulatory Visit: Payer: Self-pay | Admitting: Hematology & Oncology

## 2011-01-12 ENCOUNTER — Encounter (HOSPITAL_BASED_OUTPATIENT_CLINIC_OR_DEPARTMENT_OTHER): Payer: Medicare Other | Admitting: Hematology & Oncology

## 2011-01-12 DIAGNOSIS — I82220 Acute embolism and thrombosis of inferior vena cava: Secondary | ICD-10-CM

## 2011-01-12 DIAGNOSIS — D689 Coagulation defect, unspecified: Secondary | ICD-10-CM

## 2011-01-12 DIAGNOSIS — I81 Portal vein thrombosis: Secondary | ICD-10-CM

## 2011-01-12 LAB — CBC WITH DIFFERENTIAL (CANCER CENTER ONLY)
BASO%: 0.6 % (ref 0.0–2.0)
EOS%: 4.1 % (ref 0.0–7.0)
MCH: 30.1 pg (ref 26.0–34.0)
MCHC: 34.4 g/dL (ref 32.0–36.0)
MONO%: 8.2 % (ref 0.0–13.0)
NEUT#: 3 10*3/uL (ref 1.5–6.5)
Platelets: 226 10*3/uL (ref 145–400)

## 2011-01-12 LAB — PROTIME-INR (CHCC SATELLITE)
INR: 3 (ref 2.0–3.5)
Protime: 36 Seconds — ABNORMAL HIGH (ref 10.6–13.4)

## 2011-01-13 LAB — COMPREHENSIVE METABOLIC PANEL
ALT: 26 U/L (ref 0–35)
AST: 20 U/L (ref 0–37)
Albumin: 4.1 g/dL (ref 3.5–5.2)
Alkaline Phosphatase: 76 U/L (ref 39–117)
BUN: 27 mg/dL — ABNORMAL HIGH (ref 6–23)
Potassium: 4.3 mEq/L (ref 3.5–5.3)
Sodium: 139 mEq/L (ref 135–145)
Total Protein: 6.4 g/dL (ref 6.0–8.3)

## 2011-02-02 ENCOUNTER — Other Ambulatory Visit: Payer: Self-pay | Admitting: Hematology & Oncology

## 2011-02-02 ENCOUNTER — Encounter (HOSPITAL_BASED_OUTPATIENT_CLINIC_OR_DEPARTMENT_OTHER): Payer: Medicare Other | Admitting: Hematology & Oncology

## 2011-02-02 DIAGNOSIS — I82220 Acute embolism and thrombosis of inferior vena cava: Secondary | ICD-10-CM

## 2011-02-02 LAB — PROTIME-INR (CHCC SATELLITE): Protime: 33.6 Seconds — ABNORMAL HIGH (ref 10.6–13.4)

## 2011-02-09 ENCOUNTER — Ambulatory Visit (HOSPITAL_BASED_OUTPATIENT_CLINIC_OR_DEPARTMENT_OTHER)
Admission: RE | Admit: 2011-02-09 | Discharge: 2011-02-09 | Disposition: A | Discharge status: Home / Self Care | Payer: Medicare Other | Source: Ambulatory Visit | Attending: Hematology & Oncology | Admitting: Hematology & Oncology

## 2011-02-09 DIAGNOSIS — Q619 Cystic kidney disease, unspecified: Secondary | ICD-10-CM | POA: Insufficient documentation

## 2011-02-09 DIAGNOSIS — K449 Diaphragmatic hernia without obstruction or gangrene: Secondary | ICD-10-CM | POA: Insufficient documentation

## 2011-02-09 DIAGNOSIS — K7689 Other specified diseases of liver: Secondary | ICD-10-CM

## 2011-02-09 DIAGNOSIS — R634 Abnormal weight loss: Secondary | ICD-10-CM | POA: Insufficient documentation

## 2011-02-09 DIAGNOSIS — R7309 Other abnormal glucose: Secondary | ICD-10-CM | POA: Insufficient documentation

## 2011-02-09 DIAGNOSIS — I81 Portal vein thrombosis: Secondary | ICD-10-CM | POA: Insufficient documentation

## 2011-02-09 DIAGNOSIS — J45909 Unspecified asthma, uncomplicated: Secondary | ICD-10-CM | POA: Insufficient documentation

## 2011-02-09 DIAGNOSIS — K8020 Calculus of gallbladder without cholecystitis without obstruction: Secondary | ICD-10-CM | POA: Insufficient documentation

## 2011-02-09 DIAGNOSIS — I10 Essential (primary) hypertension: Secondary | ICD-10-CM | POA: Insufficient documentation

## 2011-02-09 MED ORDER — IOHEXOL 300 MG/ML  SOLN
100.0000 mL | Freq: Once | INTRAMUSCULAR | Status: AC | PRN
  Administered 2011-02-09: 100 mL via INTRAVENOUS

## 2011-02-13 ENCOUNTER — Other Ambulatory Visit: Payer: Self-pay | Admitting: Hematology & Oncology

## 2011-02-13 ENCOUNTER — Encounter (HOSPITAL_BASED_OUTPATIENT_CLINIC_OR_DEPARTMENT_OTHER): Payer: Medicare Other | Admitting: Hematology & Oncology

## 2011-02-13 DIAGNOSIS — I82220 Acute embolism and thrombosis of inferior vena cava: Secondary | ICD-10-CM

## 2011-02-13 LAB — PROTIME-INR (CHCC SATELLITE): INR: 2.7 (ref 2.0–3.5)

## 2011-02-16 ENCOUNTER — Other Ambulatory Visit: Payer: Medicare Other | Admitting: Lab

## 2011-03-03 ENCOUNTER — Encounter: Payer: Self-pay | Admitting: Hematology & Oncology

## 2011-03-09 ENCOUNTER — Ambulatory Visit (HOSPITAL_BASED_OUTPATIENT_CLINIC_OR_DEPARTMENT_OTHER): Payer: Medicare Other | Admitting: Hematology & Oncology

## 2011-03-09 ENCOUNTER — Other Ambulatory Visit: Payer: Self-pay | Admitting: Hematology & Oncology

## 2011-03-09 ENCOUNTER — Encounter: Payer: Self-pay | Admitting: Hematology & Oncology

## 2011-03-09 ENCOUNTER — Other Ambulatory Visit (HOSPITAL_BASED_OUTPATIENT_CLINIC_OR_DEPARTMENT_OTHER): Payer: Medicare Other | Admitting: Lab

## 2011-03-09 VITALS — BP 117/64 | HR 67 | Temp 97.0°F | Ht 65.5 in | Wt 189.0 lb

## 2011-03-09 DIAGNOSIS — I82220 Acute embolism and thrombosis of inferior vena cava: Secondary | ICD-10-CM

## 2011-03-09 DIAGNOSIS — I81 Portal vein thrombosis: Secondary | ICD-10-CM | POA: Insufficient documentation

## 2011-03-09 HISTORY — DX: Portal vein thrombosis: I81

## 2011-03-09 LAB — D-DIMER, QUANTITATIVE: D-Dimer, Quant: 0.56 ug/mL-FEU — ABNORMAL HIGH (ref 0.00–0.48)

## 2011-03-09 LAB — COMPREHENSIVE METABOLIC PANEL
Alkaline Phosphatase: 65 U/L (ref 39–117)
BUN: 24 mg/dL — ABNORMAL HIGH (ref 6–23)
CO2: 23 mEq/L (ref 19–32)
Creatinine, Ser: 0.66 mg/dL (ref 0.50–1.10)
Glucose, Bld: 79 mg/dL (ref 70–99)
Sodium: 140 mEq/L (ref 135–145)
Total Bilirubin: 0.4 mg/dL (ref 0.3–1.2)

## 2011-03-09 LAB — CBC WITH DIFFERENTIAL (CANCER CENTER ONLY)
BASO#: 0.1 10*3/uL (ref 0.0–0.2)
Eosinophils Absolute: 0.2 10*3/uL (ref 0.0–0.5)
HGB: 13.1 g/dL (ref 11.6–15.9)
LYMPH#: 2 10*3/uL (ref 0.9–3.3)
MONO#: 0.7 10*3/uL (ref 0.1–0.9)
NEUT#: 3.2 10*3/uL (ref 1.5–6.5)
RBC: 4.42 10*6/uL (ref 3.70–5.32)

## 2011-03-09 LAB — PROTIME-INR (CHCC SATELLITE)
INR: 2.7 (ref 2.0–3.5)
Protime: 32.4 Seconds — ABNORMAL HIGH (ref 10.6–13.4)

## 2011-03-09 LAB — LACTATE DEHYDROGENASE: LDH: 125 U/L (ref 94–250)

## 2011-03-09 NOTE — Progress Notes (Signed)
This office note has been dictated.

## 2011-03-10 NOTE — Progress Notes (Signed)
CC:   Renaye Rakers, M.D.  DIAGNOSIS:  Idiopathic portal vein thrombosis.  CURRENT THERAPY:  Coumadin to maintain INR between 2-3.  INTERVAL HISTORY:  Ms. Storbeck comes for followup.  She is still doing fairly well.  We did go ahead and repeat a CT scan of the abdomen and pelvis.  This was done on 02/09/2011.  This is shows partial recanalization of the portal vein thrombosis.  The portal and splenic veins are no patent without residual thrombus.  There are some collaterals that are noted.  Liver looked okay with the new density in the portal phase in the right hepatic lobe.  This was likely felt to be a underlying meningioma.  There was some cholelithiasis.  She had a good Thanksgiving.  She ate well.  There has been no abdominal pain.  She has not noted any problems with fevers or chills.  There has been no leg swelling.  She has had no rashes.  PHYSICAL EXAMINATION:  This is a well-developed African female in no obvious distress.  Vital signs show temperature of 97, pulse 67, respiratory rate 20, blood pressure 117/64.  Weight is 189.  Head and neck exam shows a normocephalic, atraumatic skull.  There are no ocular or oral lesions.  There are no palpable cervical or supraclavicular lymph nodes.  Lungs: Clear to percussion and auscultation bilaterally. Cardiac: Regular rate and rhythm with normal S1, S2.  There are no murmurs, rubs or bruits.  Abdomen:  Soft with good bowel sounds.  There is no palpable abdominal mass.  There is no fluid wave.  There is no palpable hepatosplenomegaly.  Back:  No tenderness of the spine, ribs, or hips.  Extremities: No clubbing, cyanosis or edema.  Neurological: No neurological deficits.  LABORATORY DATA:  White blood cell 6.2, hemoglobin 13.1, hematocrit 37.9, platelet count 217.  INR is 2.7.  IMPRESSION:  Ms. Dukeman is a 65 year old African female with  portal vein thrombosis.  All of her hypercoagulable studies have been negative.  She is on  Coumadin right now.  I probably will keep her on Coumadin until I see her back in January.  At that point in time, she would have been on Coumadin for a good 6 months.  We should and we may want to consider keeping her on anticoagulation may be a little bit longer. Again, I think this is a "gray area."  She is motivated.  She has done well with the Coumadin.  We have had a very nice response by her last CT scan.  I may opt for possibly 1 year of anticoagulation for her.  After that, I would then consider putting her on aspirin as the latest studies have shown a benefit for aspirin after therapeutic  anticoagulation.  We plan to get her back in January.    ______________________________ [name], M.D. PRE/MEDQ  D:  03/09/2011  T:  03/10/2011  Job:  [id number]

## 2011-04-02 ENCOUNTER — Encounter: Payer: Medicare Other | Attending: Family Medicine | Admitting: *Deleted

## 2011-04-02 DIAGNOSIS — Z713 Dietary counseling and surveillance: Secondary | ICD-10-CM | POA: Insufficient documentation

## 2011-04-02 DIAGNOSIS — E119 Type 2 diabetes mellitus without complications: Secondary | ICD-10-CM | POA: Insufficient documentation

## 2011-04-02 NOTE — Patient Instructions (Signed)
Goals:  Follow Diabetes Meal Plan as instructed  Eat 3 meals and 2 snacks, every 3-5 hrs  Limit carbohydrate intake to 45 grams carbohydrate/meal Limit carbohydrate intake to 15-20 grams carbohydrate/snack Add lean protein foods to meals/snacks  Monitor glucose levels as instructed by your doctor  Aim for 15-30 mins of physical activity daily  Bring food record and glucose log to your next nutrition visit

## 2011-04-02 NOTE — Progress Notes (Signed)
  Medical Nutrition Therapy: Appt start time: 0930 end time: 1000.   Assessment: Primary concerns today: type 2 diabetes management. Pt has not had any recent labs drawn, continues to do well with carbohydrate counting, BGM, and and physical activity.  MEDICATIONS: No recent changes. Pt is being prescribed Amaryl yet does not take it as she does not believe her glucose levels are not elevated enough to take medications.   DIETARY INTAKE:   24-hr recall:  No food recall given at time of visit however pt reports that she regularly counts 30-45 g carb per meal and 15-20g carb with snacks. She adds lean protein sources to most meals and sncks. She does occasionally have sweets yet seems to be doing a great job of balancing her diet and portion sizes.  Beverages: water, acai berry juice (limits due to high sugar), diet sodas, light beer (1/week)   Recent physical activity: She is able to play leisurely tennis, 3-4 times/week for 30 minutes.  Estimated energy needs:  1500-1600 calories  180 g carbohydrates  110 g protein  50 g fat   CBG checks: Twice daily  CBG ranges: 85-115 (fasting), 110-150 (2 hrs after)  Lab Results   Component  Value  Date    HGBA1C  7.1*  10/11/2010    Progress Towards Goal(s): In progress.   Nutritional Diagnosis:  [place]-2.1 Inpaired nutrition utilization As related to impaired glucose tolerance. As evidenced by elevated glucose levels in AM/2 hrs after meals.   Intervention: Nutrition education.   Monitoring/Evaluation: Dietary intake, exercise, glucose levels, and body weight in 3 month(s).

## 2011-04-27 ENCOUNTER — Ambulatory Visit (HOSPITAL_BASED_OUTPATIENT_CLINIC_OR_DEPARTMENT_OTHER): Payer: Medicare Other | Admitting: Hematology & Oncology

## 2011-04-27 ENCOUNTER — Other Ambulatory Visit: Payer: Self-pay | Admitting: Hematology & Oncology

## 2011-04-27 ENCOUNTER — Other Ambulatory Visit (HOSPITAL_BASED_OUTPATIENT_CLINIC_OR_DEPARTMENT_OTHER): Payer: Medicare Other | Admitting: Lab

## 2011-04-27 VITALS — BP 105/65 | HR 69 | Temp 98.0°F | Ht 65.0 in | Wt 188.0 lb

## 2011-04-27 DIAGNOSIS — I81 Portal vein thrombosis: Secondary | ICD-10-CM

## 2011-04-27 LAB — CBC WITH DIFFERENTIAL (CANCER CENTER ONLY)
BASO#: 0.1 10*3/uL (ref 0.0–0.2)
BASO%: 1.1 % (ref 0.0–2.0)
EOS%: 5 % (ref 0.0–7.0)
HCT: 40.3 % (ref 34.8–46.6)
HGB: 13.7 g/dL (ref 11.6–15.9)
LYMPH#: 2.1 10*3/uL (ref 0.9–3.3)
MCHC: 34 g/dL (ref 32.0–36.0)
MONO#: 0.4 10*3/uL (ref 0.1–0.9)
NEUT#: 2.8 10*3/uL (ref 1.5–6.5)
NEUT%: 49.8 % (ref 39.6–80.0)
WBC: 5.6 10*3/uL (ref 3.9–10.0)

## 2011-04-27 LAB — PROTIME-INR (CHCC SATELLITE): INR: 3.7 — ABNORMAL HIGH (ref 2.0–3.5)

## 2011-04-27 LAB — CHCC SATELLITE - SMEAR

## 2011-04-27 NOTE — Progress Notes (Signed)
CC:   Vicki Dennis, M.D.  DIAGNOSIS:  Idiopathic portal vein thrombosis.  CURRENT THERAPY:  Coumadin to maintain INR between 2-3.  INTERIM HISTORY:  Vicki Dennis comes in followup.  She enjoyed the holidays. She has really had no problems at all since we last saw her. She is eating well.  She has not had any abdominal pain.  There has been no change in bowel or bladder habits.  There has been no bleeding.  She has been eating pretty much what she wishes.  When we last did a CT scan in October, she had a very nice response with no obvious thrombus noted within the portal vein. There was some collaterals that were appreciated.  She has had no leg swelling.  There has been no rashes.  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  I told that she could do this, but only after she gets done with her Coumadin program.  PHYSICAL EXAM:  General: This is a well-developed, well-nourished black female in no obvious distress.  Vital signs: Temperature 98, pulse 69, respiratory rate 18, blood pressure 105/65, and weight is 198.  Head and Neck exam: Shows a normocephalic, atraumatic skull.  There are no ocular or oral lesions.  There is no scleral icterus.  She has no petechia or hematomas within the oropharyngeal cavity.  There is no adenopathy in her neck.  Lungs are clear bilaterally.  She has no rales or rhonchi. Cardiac exam: Regular rate and rhythm with a normal S1 and S2.  There are no murmurs, rubs, or bruits.  Abdominal exam: Soft with good bowel sounds.  There is no palpable abdominal mass.  There is no fluid wave. Her liver edge is palpable with deep inspiration.  Back exam: No tenderness over the spine, ribs, or hips.  Extremities: Shows no clubbing, cyanosis, or edema.  Neurological exam: Shows no focal neurological deficits.  Skin exam: No rashes, ecchymosis, or petechia.  LABORATORY STUDIES:  White cell count is 5.6, hemoglobin 13.7, hematocrit 40.3, and  platelet count 221.  INR is 3.7.  IMPRESSION:  Vicki Dennis is a 66 year old African American female with a portal vein thrombosis.  Hypercoagulable studies were all negative.  I really want to keep her on Coumadin for 1 year.  She is doing well with the Coumadin.  I do not think we need to switch over to 1 of the new factor 10A inhibitors.  Her dose of Coumadin right now is 7.5 mg a day.  I am not going to change her dose.  We will recheck her INR in 2 weeks.  I want to see Vicki Dennis in about 2 months.  She will finish up her course of Coumadin at the end of June.  When she completes this, then will begin her on aspirin.    ______________________________ [name], M.D. PRE/MEDQ  D:  04/27/2011  T:  04/27/2011  Job:  [id number]  ADDENDUM:  D-Dimer is 0.58.

## 2011-04-27 NOTE — Progress Notes (Signed)
This office note has been dictated.

## 2011-05-11 ENCOUNTER — Other Ambulatory Visit (HOSPITAL_BASED_OUTPATIENT_CLINIC_OR_DEPARTMENT_OTHER): Payer: Medicare Other | Admitting: Lab

## 2011-05-11 DIAGNOSIS — I81 Portal vein thrombosis: Secondary | ICD-10-CM

## 2011-05-11 LAB — CBC WITH DIFFERENTIAL (CANCER CENTER ONLY)
BASO%: 1 % (ref 0.0–2.0)
LYMPH%: 27.5 % (ref 14.0–48.0)
MCH: 29.3 pg (ref 26.0–34.0)
MCV: 88 fL (ref 81–101)
MONO%: 9.1 % (ref 0.0–13.0)
Platelets: 203 10*3/uL (ref 145–400)
RDW: 14.3 % (ref 11.1–15.7)
WBC: 6.1 10*3/uL (ref 3.9–10.0)

## 2011-05-11 LAB — PROTIME-INR (CHCC SATELLITE): Protime: 33.6 Seconds — ABNORMAL HIGH (ref 10.6–13.4)

## 2011-06-03 ENCOUNTER — Ambulatory Visit: Payer: Medicare Other | Admitting: *Deleted

## 2011-06-04 ENCOUNTER — Encounter: Payer: Medicare Other | Attending: Family Medicine | Admitting: Dietician

## 2011-06-04 ENCOUNTER — Encounter: Payer: Self-pay | Admitting: Dietician

## 2011-06-04 DIAGNOSIS — E119 Type 2 diabetes mellitus without complications: Secondary | ICD-10-CM | POA: Insufficient documentation

## 2011-06-04 DIAGNOSIS — Z713 Dietary counseling and surveillance: Secondary | ICD-10-CM | POA: Insufficient documentation

## 2011-06-04 NOTE — Patient Instructions (Signed)
   Continue to take your glucose and monitor why things are going on.  Try taking your glucose at bedtime and have no snack and check the glucose fasting the next day.  You want to have a glucose lower than what it was at bedtime.    Then: try having a snack and see the effects of of the snack on the fasting glucose level.  Try to get to exercise.  Start with the walking on a flat surface, perhaps the track at [employer].  Aim for continuing the weight loss, aim to get back to the 170's.

## 2011-06-04 NOTE — Progress Notes (Signed)
  Medical Nutrition Therapy:  Appt start time: 0930 end time:  1000.  Assessment:  Primary concerns today: Wanting to continue to maintain the glucose at lower levels and to loose weight.  Agrees that she needs to aim to get her weight back to less that 170 lbs.  Notes she is feeling better and having more energy.  Gives a recent history for clots in the portal vein system and having been started on Coumadin.  With the Coumadin, her Pro Time has been lower and she has been fearful of bleed out with any type of injury.  This she credits as the reason for not exercising.  Has been working on checking her blood glucose and noting the activities or events that Stephanye Finnicum have caused her elevation.  Had a recent visit to her MD and Dr. Parke Simmers wants her to aim for a fasting blood glucose of 90-100 mg.    BLOOD GLUCOSE MONITORING;Svering 2-3 times per day.  Fasting:102,109,113,101,108,110,103.  AC Lunch: 110, 102 AC Dinner: 122, 93, 103, 127, 108.  HS: 112.  MEDICATIONS: No diabetes medications.  DIETARY INTAKE:  24-hr recall:  B (7:30 AM): Oatmeal 1/2 with 1/4 cup milk and  with pomegromet (dried) with few fresh bluberries, sliced almonds. Uses stevia or raw  sugar. Snk (mid AM) :not usually.  L (12:00-1:00 PM): sandwich with chicken or ham or tuna with the grain bread, small salad, fruit.  Snk (mid  PM): no D (4:30-6:30 PM): beef (98%) grill beef with mushrooms, salad water  Snk (bedtime PM): Rare. Use the popcorn in microwave. Beverages: water, milk  Recent physical activity: Has not been exercising.  Fearful of a fall and possible bleeding and hemorrhage.  She agrees that walking on a level/stable surface would probably work.  She agrees to try the track at [facility] for walking. She is to start low and slow and to progress to 30 minutes 5 times per week.  If and when she is able to go off of the Coumadin, she can reevaluate going back to water aerobics.  Estimated energy needs: 1500 calories 180 g  carbohydrates 110 g protein 50 g fat  Progress Towards Goal(s):  Some progress.   Nutritional Diagnosis:  [place]-2.1 Inpaired nutrition utilization As related to glucose.  As evidenced by elevated blood glucose levels and a diagnosis of type 2 diabetes.    Intervention:  Nutrition Review of diet for carbohydrate restriction.  Review of the need for regular meals or if not having a meal, have a large snack.  Handouts given during visit include:  Diabetes Self-management Guide  Monitoring/Evaluation:  Dietary intake, exercise, blood glucose levels, and body weight in three months.  She is to call with the results of her most recent A1C.

## 2011-06-29 ENCOUNTER — Other Ambulatory Visit (HOSPITAL_BASED_OUTPATIENT_CLINIC_OR_DEPARTMENT_OTHER): Payer: Medicare Other | Admitting: Lab

## 2011-06-29 ENCOUNTER — Ambulatory Visit (HOSPITAL_BASED_OUTPATIENT_CLINIC_OR_DEPARTMENT_OTHER): Payer: Medicare Other | Admitting: Hematology & Oncology

## 2011-06-29 VITALS — BP 101/68 | HR 60 | Ht 65.0 in | Wt 188.0 lb

## 2011-06-29 DIAGNOSIS — I81 Portal vein thrombosis: Secondary | ICD-10-CM

## 2011-06-29 LAB — CBC WITH DIFFERENTIAL (CANCER CENTER ONLY)
Eosinophils Absolute: 0.1 10*3/uL (ref 0.0–0.5)
HCT: 39.1 % (ref 34.8–46.6)
HGB: 13.2 g/dL (ref 11.6–15.9)
LYMPH%: 34.5 % (ref 14.0–48.0)
MCV: 88 fL (ref 81–101)
MONO#: 0.5 10*3/uL (ref 0.1–0.9)
NEUT%: 51.8 % (ref 39.6–80.0)
RBC: 4.45 10*6/uL (ref 3.70–5.32)
WBC: 4.7 10*3/uL (ref 3.9–10.0)

## 2011-06-29 NOTE — Progress Notes (Signed)
CC:   Vicki Dennis, M.D.  DIAGNOSIS:  Portal vein thrombosis, idiopathic.  CURRENT THERAPY:  Coumadin to maintain INR between 2-3.  INTERVAL HISTORY:  Vicki Dennis comes in for followup.  She continues to do well.  She has had no problems with the Coumadin.  She has been pretty therapeutic throughout her period of anticoagulation.  There is no abdominal pain.  She is having no change in bowel or bladder habits.  She has not noticed any cough.  There is no chest wall pain. There is no leg swelling or pain.  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  At that point in time, she will be off of Coumadin, so I do not see that taking any antibiotic or antiviral prophylaxis will be a problem for her.  PHYSICAL EXAMINATION:  General Appearance:  This is a well-developed, well-nourished, white female in no obvious distress.  Vital Signs: Temperature 97.1.  Pulse 60.  Respiratory rate 16.  Blood pressure 101/68.  Weight is 188.  Head and Neck Exam:  A normocephalic, atraumatic skull.  There are no ocular or oral lesions.  There are no palpable cervical or supraclavicular lymph nodes.  Lungs:  Clear bilaterally.  Cardiac Exam:  Regular rate and rhythm with a normal S1 and S2.  There are no murmurs, rubs, or bruits.  Abdominal Exam:  Soft with good bowel sounds.  There is no palpable abdominal mass.  There is no palpable hepatosplenomegaly.  Back Exam:  No tenderness over the spine, ribs, or hips.  Extremities:  No clubbing, cyanosis, or edema.  LABORATORY STUDIES:  White cell count is 4.7, hemoglobin 13.2, hematocrit 39.1, platelet count is 199.  INR is 3.5.  IMPRESSION:  Vicki Dennis is a 66 year old, African female with idiopathic portal vein thrombosis.  She is doing well.  She has had no problems with the Coumadin.  We will go ahead and get another protime on her in about 6 weeks.  I do want to get another CT scan on her in June.  That way, we can sort of have a baseline  study prior to her getting off of Coumadin.  We will plan to finish up her anticoagulation in June.  After that, I will put her on aspirin, which I think should be appropriate.   ______________________________ [name], M.D. PRE/MEDQ  D:  06/29/2011  T:  06/29/2011  Job:  [id number]  ADDENDUM:  D-Dimer is 0.51.

## 2011-06-29 NOTE — Progress Notes (Signed)
This office note has been dictated.

## 2011-08-06 ENCOUNTER — Encounter: Payer: Self-pay | Admitting: Dietician

## 2011-08-06 ENCOUNTER — Encounter: Payer: Medicare Other | Attending: Family Medicine | Admitting: Dietician

## 2011-08-06 DIAGNOSIS — Z713 Dietary counseling and surveillance: Secondary | ICD-10-CM | POA: Insufficient documentation

## 2011-08-06 DIAGNOSIS — E119 Type 2 diabetes mellitus without complications: Secondary | ICD-10-CM | POA: Insufficient documentation

## 2011-08-06 NOTE — Progress Notes (Signed)
  Medical Nutrition Therapy:  Appt start time: 0930 end time:  1000. Currently pAssessment:  Primary concerns today: Wanting to make sure that she is staying on target with her weight loss and her blood glucose levels.  Has lost 0.4 lb since last visit and is pleased with this.  She has been to [place] to babysit her grandchildren and found that she had less control over her food choices and her eating patterns at their home.  She feared she had gained weight.  She did increase her walking/activity while there and was diligent in monitoring her portions of the foods she was eating.  At this time has been using the [employer] in [place] for many of the organic products that she is trying to use.  She sees this site as being cheaper than the stores offering organic products in [place]   BLOOD GLUCOSE: Fasting levels:78-90's-105, 124  Generally in the 90"              Pre-Dinner 131,89,111              Bedtime: 115,113,110  MEDICATIONS: Completed review of medications.  DIETARY INTAKE:  24-hr recall:  B (7:30 AM): Currently doing eggs or eggs and cheese, or an omlett OR a Flax and bran cereal with 2% milk and berries or banana  Snk (mid AM) :none  L (12:00-1:00 PM): Continues to have a sandwich with multi-grain bread.  Using low sodium [place], or ham with swiss or veggie cheese (found these to be lower in sodium than other cheeses)  Snk (mid PM): Usually none D (4:30-6:30 PM): Continues to use the 98% lean beef frequently.  Using an organic product. Last evening had chili made with the beef and a salad  Snk (HD PM): Frequently, Porche Steinberger not have the snack Beverages: Water, tea   Recent physical activity: Has increased her walking and is about starting to increase her time and her distance.  Estimated energy needs:Ht: 66 in  WT: 187.2 lb  BMI: 30.3 kg/m2  ADJ WT: 148-150 lb 1400-1500 calories 160-165 g carbohydrates 105-110 g protein 38-40 g fat  Progress Towards Goal(s):  In  progress.   Nutritional Diagnosis:  [place]-2.1 Inpaired nutrition utilization As related to glucose.  As evidenced by a diagnosis of type 2 diabetes and using a carb restricted diet to maintain controlled blood glucose levels..    Intervention:  Nutrition Eating more organic products and is using information to better use the food label.  Needs to continue to increase her exercise and to continue to limit her sodium intake  Handouts given during visit include: None at today's visit.  Monitoring/Evaluation:  Dietary intake, exercise, blood glucose levels, and body weight in 8 weeks pr as she desires.

## 2011-08-08 ENCOUNTER — Encounter: Payer: Self-pay | Admitting: Dietician

## 2011-08-10 ENCOUNTER — Other Ambulatory Visit (HOSPITAL_BASED_OUTPATIENT_CLINIC_OR_DEPARTMENT_OTHER): Payer: Medicare Other | Admitting: Lab

## 2011-08-10 DIAGNOSIS — I81 Portal vein thrombosis: Secondary | ICD-10-CM

## 2011-08-10 LAB — PROTIME-INR (CHCC SATELLITE)

## 2011-08-10 LAB — CBC WITH DIFFERENTIAL (CANCER CENTER ONLY)
BASO#: 0.1 10*3/uL (ref 0.0–0.2)
EOS%: 3.9 % (ref 0.0–7.0)
HCT: 38.4 % (ref 34.8–46.6)
HGB: 12.9 g/dL (ref 11.6–15.9)
LYMPH#: 2 10*3/uL (ref 0.9–3.3)
LYMPH%: 31.9 % (ref 14.0–48.0)
MCH: 29.6 pg (ref 26.0–34.0)
MCHC: 33.6 g/dL (ref 32.0–36.0)
MCV: 88 fL (ref 81–101)
MONO%: 9.6 % (ref 0.0–13.0)
NEUT%: 53.2 % (ref 39.6–80.0)
RDW: 14.1 % (ref 11.1–15.7)

## 2011-09-15 ENCOUNTER — Encounter: Payer: Self-pay | Admitting: Dietician

## 2011-09-15 ENCOUNTER — Encounter: Payer: Medicare Other | Attending: Family Medicine | Admitting: Dietician

## 2011-09-15 DIAGNOSIS — E119 Type 2 diabetes mellitus without complications: Secondary | ICD-10-CM | POA: Insufficient documentation

## 2011-09-15 DIAGNOSIS — E785 Hyperlipidemia, unspecified: Secondary | ICD-10-CM

## 2011-09-15 DIAGNOSIS — I10 Essential (primary) hypertension: Secondary | ICD-10-CM

## 2011-09-15 DIAGNOSIS — Z713 Dietary counseling and surveillance: Secondary | ICD-10-CM | POA: Insufficient documentation

## 2011-09-15 NOTE — Progress Notes (Signed)
  Medical Nutrition Therapy:  Appt start time: 0900 end time:  0930.  Assessment:  Primary concerns today: Continuing her weight loss process and getting healthier for a future trip to [place] in 2014. Wanting to keep blood glucose levels in the lower ranges and to prevent the need for medication.  Has been trying to maintain an active lifestyle and to loose weight.  Trying to incorporate more activity into each day.  BLOOD GLUCOSE LEVELS:Fasting: 98, 92, 91, 74, 103, 110, 100, 107, 97, 78 88, 113, 99 AC Dinner: 115, 102, 101, 102 101 PC Dinner 111 HS: 122, 93  MEDICATIONS: Review of medications completed.  Continues to not take glucose lowering medications.  DIETARY INTAKE:  24-hr recall:  B (7:70 AM): Continues to use the flax and bran cereal with the 2% milk and either berries or a banana. OR Jamesina Gaugh have the [place] bacon with a poached egg and a slice of multi-grain bread  Snk (mid AM) :none  L (12:00-1:00 PM): Continues to have a sandwich on the multi-grain bread using the sow sodium [place] or ham with swiss or veggie cheese.  Snk (Mid PM): Usually none. D (4:30-6:00 PM): Continues to use the lean meats.  Using more organic chicken and fish and an occasional serving of the lean beef or lamb. Will add a veggie and a complex starch.  Snk (HS PM): Frequently will not have a snack Beverages: water and tea.    Recent physical activity: Walking more.  Plans to visit with her son again and anticipates getting to walk more in [place] as she chooses to use public transportation.  Estimated energy needs:Ht: 66 in  WT: 189.6 lb  BMI 30.7 kg/m2  1400-1500 calories 160-165 g carbohydrates 105-119 g protein 38-40 g fat  Progress Towards Goal(s):  In progress.   Nutritional Diagnosis:  [place]-2.1 Inpaired nutrition utilization As related to glucose metabolism.  As evidenced by diagnosis of type 2 diabetes with weight gain and difficulty losing weight without limiting carbs and increasing activity and  exercise..    Intervention:  Nutrition Review of nutrient needs and lifestyle changes currently under change. Sid the [employer] Conposition Analysis using [employer]. Weight on this scale:188. 5lb BMI: 31.8 Fat % 39.9 % Fat Mass: 75 lb Fat Free Mass:113.5 lb Tttal Body Water:83 lb Target Body Fat is at 28%   Handouts given during visit include:  Explanation for the [place] Body Conposition Analysis  Monitoring/Evaluation:  Dietary intake, exercise, blood glucose levels, and body weight in 6-8 weeks.  Will be time for the A1C level.

## 2011-09-21 ENCOUNTER — Ambulatory Visit (HOSPITAL_BASED_OUTPATIENT_CLINIC_OR_DEPARTMENT_OTHER): Payer: Medicare Other | Admitting: Hematology & Oncology

## 2011-09-21 ENCOUNTER — Other Ambulatory Visit (HOSPITAL_BASED_OUTPATIENT_CLINIC_OR_DEPARTMENT_OTHER): Payer: Medicare Other | Admitting: Lab

## 2011-09-21 ENCOUNTER — Encounter: Payer: Self-pay | Admitting: *Deleted

## 2011-09-21 ENCOUNTER — Other Ambulatory Visit: Payer: Self-pay | Admitting: *Deleted

## 2011-09-21 VITALS — BP 101/64 | HR 94 | Temp 97.4°F | Ht 65.0 in | Wt 187.0 lb

## 2011-09-21 DIAGNOSIS — E119 Type 2 diabetes mellitus without complications: Secondary | ICD-10-CM

## 2011-09-21 DIAGNOSIS — I81 Portal vein thrombosis: Secondary | ICD-10-CM

## 2011-09-21 DIAGNOSIS — Z86718 Personal history of other venous thrombosis and embolism: Secondary | ICD-10-CM

## 2011-09-21 DIAGNOSIS — I10 Essential (primary) hypertension: Secondary | ICD-10-CM

## 2011-09-21 DIAGNOSIS — K7460 Unspecified cirrhosis of liver: Secondary | ICD-10-CM

## 2011-09-21 LAB — CHCC SATELLITE - SMEAR

## 2011-09-21 LAB — CBC WITH DIFFERENTIAL (CANCER CENTER ONLY)
BASO%: 1.3 % (ref 0.0–2.0)
Eosinophils Absolute: 0.2 10*3/uL (ref 0.0–0.5)
HCT: 39.2 % (ref 34.8–46.6)
LYMPH#: 1.7 10*3/uL (ref 0.9–3.3)
MONO#: 0.5 10*3/uL (ref 0.1–0.9)
NEUT%: 47.2 % (ref 39.6–80.0)
RBC: 4.47 10*6/uL (ref 3.70–5.32)
RDW: 14.5 % (ref 11.1–15.7)
WBC: 4.7 10*3/uL (ref 3.9–10.0)

## 2011-09-21 LAB — COMPREHENSIVE METABOLIC PANEL
AST: 16 U/L (ref 0–37)
Albumin: 4.2 g/dL (ref 3.5–5.2)
BUN: 23 mg/dL (ref 6–23)
Calcium: 9.6 mg/dL (ref 8.4–10.5)
Chloride: 103 mEq/L (ref 96–112)
Potassium: 4.4 mEq/L (ref 3.5–5.3)
Total Protein: 6.3 g/dL (ref 6.0–8.3)

## 2011-09-21 LAB — PROTIME-INR (CHCC SATELLITE)

## 2011-09-21 NOTE — Progress Notes (Signed)
This office note has been dictated.

## 2011-09-21 NOTE — Progress Notes (Signed)
BMET added for upcoming scan.

## 2011-09-22 NOTE — Progress Notes (Signed)
CC:   Vicki Dennis, M.D.  DIAGNOSIS:  Idiopathic portal vein thrombosis.  CURRENT THERAPY:  Coumadin to maintain AN INR between 2-3.  INTERIM HISTORY:  Vicki Dennis comes in for followup.  She is doing fairly well.  She has had no problems with the Coumadin.  She has had no abdominal pain.  She has had no cough or shortness breath.  There has been no bleeding.  She is really looking forward to going over to [place] next year.  She has plans to look at her family heritage.  She has had no problems with abdominal pain.  She has had no nausea or vomiting.  There has been no leg swelling.  Her last D-dimer in April was 0.56.  PHYSICAL EXAMINATION:  This is a well-developed, well-nourished black female in no obvious distress.  Vital signs:  Temperature of 97.2, pulse 94, respiratory rate 18, blood pressure 101/64.  Weight is 187.  Head and neck:  A normocephalic, atraumatic skull.  There are no ocular or oral lesions.  There are no palpable cervical or supraclavicular lymph nodes.  Lungs:  Clear bilaterally.  Cardiac:  Regular rate and rhythm with a normal S1 and S2.  There are no murmurs, rubs or bruits. Abdomen:  Soft with good bowel sounds.  There is no palpable abdominal mass.  There is no palpable hepatosplenomegaly.  Back:  No tenderness over the spine, ribs, or hips.  Extremities:  No clubbing, cyanosis or edema.  Neurologic:  No focal neurological deficits.  LABORATORY STUDIES:  White count is 4.7, hemoglobin 13.1, hematocrit 39.2, platelet count is 193.  MCV is 88. Her INR is 2.2.  IMPRESSION:  Vicki Dennis is a 66 year old African American female with a history of portal vein thrombosis.  She presented back in I think May or June of 2012.  We will go ahead and plan for a followup CT scan on her.  I want to see how this portal vein thrombosis has responded.  Her last scan done back in I think 2012 in October showed no residual thrombus.  Hopefully, will be able to get her off  Coumadin at the end of June or July and then just put her on aspirin.  I will plan to get her back to see me in another month or so.  Again, we will keep her on Coumadin until I see her back.    ______________________________ [name], M.D. PRE/MEDQ  D:  09/21/2011  T:  09/22/2011  Job:  [id number]

## 2011-09-25 ENCOUNTER — Ambulatory Visit (HOSPITAL_BASED_OUTPATIENT_CLINIC_OR_DEPARTMENT_OTHER)
Admission: RE | Admit: 2011-09-25 | Discharge: 2011-09-25 | Disposition: A | Discharge status: Home / Self Care | Payer: Medicare Other | Source: Ambulatory Visit | Attending: Hematology & Oncology | Admitting: Hematology & Oncology

## 2011-09-25 DIAGNOSIS — K7460 Unspecified cirrhosis of liver: Secondary | ICD-10-CM | POA: Insufficient documentation

## 2011-09-25 DIAGNOSIS — Z7901 Long term (current) use of anticoagulants: Secondary | ICD-10-CM | POA: Insufficient documentation

## 2011-09-25 DIAGNOSIS — I81 Portal vein thrombosis: Secondary | ICD-10-CM | POA: Insufficient documentation

## 2011-09-25 DIAGNOSIS — K449 Diaphragmatic hernia without obstruction or gangrene: Secondary | ICD-10-CM | POA: Insufficient documentation

## 2011-09-25 MED ORDER — IOHEXOL 300 MG/ML  SOLN
100.0000 mL | Freq: Once | INTRAMUSCULAR | Status: AC | PRN
  Administered 2011-09-25: 100 mL via INTRAVENOUS

## 2011-09-28 ENCOUNTER — Telehealth: Payer: Self-pay | Admitting: *Deleted

## 2011-09-28 NOTE — Telephone Encounter (Addendum)
Message copied by Wynonia Hazard on Mon Sep 28, 2011  6:22 PM ------      Message from: [name]      Created: Sun Sep 27, 2011  9:18 PM       Call - no blood clot seen now. Pete  09/28/11 - Spoke to pt. Gave her the above message.

## 2011-10-06 ENCOUNTER — Ambulatory Visit: Payer: Medicare Other | Admitting: Dietician

## 2011-11-02 ENCOUNTER — Telehealth: Payer: Self-pay | Admitting: Hematology & Oncology

## 2011-11-02 NOTE — Telephone Encounter (Signed)
Pt moved 7-25 to 8-12

## 2011-11-04 ENCOUNTER — Ambulatory Visit: Payer: Medicare Other | Admitting: Dietician

## 2011-11-05 ENCOUNTER — Other Ambulatory Visit: Payer: Medicare Other | Admitting: Lab

## 2011-11-05 ENCOUNTER — Ambulatory Visit: Payer: Medicare Other | Admitting: Hematology & Oncology

## 2011-11-23 ENCOUNTER — Other Ambulatory Visit (HOSPITAL_BASED_OUTPATIENT_CLINIC_OR_DEPARTMENT_OTHER): Payer: Medicare Other | Admitting: Lab

## 2011-11-23 ENCOUNTER — Ambulatory Visit (HOSPITAL_BASED_OUTPATIENT_CLINIC_OR_DEPARTMENT_OTHER): Payer: Medicare Other | Admitting: Hematology & Oncology

## 2011-11-23 VITALS — BP 91/61 | HR 61 | Temp 97.6°F | Resp 20 | Ht 65.0 in | Wt 184.0 lb

## 2011-11-23 DIAGNOSIS — E119 Type 2 diabetes mellitus without complications: Secondary | ICD-10-CM

## 2011-11-23 DIAGNOSIS — I81 Portal vein thrombosis: Secondary | ICD-10-CM

## 2011-11-23 DIAGNOSIS — I10 Essential (primary) hypertension: Secondary | ICD-10-CM

## 2011-11-23 LAB — CBC WITH DIFFERENTIAL (CANCER CENTER ONLY)
BASO%: 1.1 % (ref 0.0–2.0)
LYMPH#: 1.8 10*3/uL (ref 0.9–3.3)
MONO#: 0.6 10*3/uL (ref 0.1–0.9)
NEUT#: 2.2 10*3/uL (ref 1.5–6.5)
Platelets: 193 10*3/uL (ref 145–400)
RDW: 14.3 % (ref 11.1–15.7)
WBC: 4.7 10*3/uL (ref 3.9–10.0)

## 2011-11-23 LAB — BASIC METABOLIC PANEL
Chloride: 104 mEq/L (ref 96–112)
Creatinine, Ser: 0.74 mg/dL (ref 0.50–1.10)
Potassium: 4 mEq/L (ref 3.5–5.3)
Sodium: 138 mEq/L (ref 135–145)

## 2011-11-23 LAB — D-DIMER, QUANTITATIVE: D-Dimer, Quant: 0.74 ug/mL-FEU — ABNORMAL HIGH (ref 0.00–0.48)

## 2011-11-23 NOTE — Progress Notes (Signed)
This office note has been dictated.

## 2011-11-24 NOTE — Progress Notes (Signed)
CC:   Renaye Rakers, M.D.  DIAGNOSIS:  Portal vein thrombosis, idiopathic.  CURRENT THERAPY: 1. Patient to complete 1 year of Coumadin today. 2. Patient to start aspirin 162 mg p.o. daily.  INTERIM HISTORY:  Ms. Vicki Dennis comes in for followup.  She is still doing well.  We did go ahead and repeat a CT scan on her back in June.  This was done so we could assess for the thrombosis response to therapy.  The CT scan did not show any residual thrombosis.  There was good re- collateralization.  She had good perfusion.  She feels well.  She has had no bleeding.  She has had no nausea or vomiting.  She has had no cough or shortness of breath.  There has been no change in bowel or bladder habits.  There has been no leg swelling.  PHYSICAL EXAMINATION:  General:  This is a well-developed, well- nourished African American female in no obvious distress.  Vital Signs: Show a temperature of 97.6, pulse 61, respiratory rate 18, blood pressure 191/61, weight is 184.  Head and Neck Exam:  Shows a normocephalic, atraumatic skull.  There are no ocular or oral lesions. There are no palpable cervical or supraclavicular lymph nodes.  Lungs: Clear bilaterally.  Cardiac:  Regular rate and rhythm with a normal S1 and S2.  There are no murmurs, rubs, or bruits.  Abdominal Exam:  Soft with good bowel sounds.  There is no fluid wave.  There is no palpable hepatosplenomegaly.  Extremities:  Show no clubbing, cyanosis, or edema. Neurological Exam:  Shows no focal neurological deficits.  Skin.  No ecchymoses or petechia.  LABORATORY STUDIES:  White cell count is 4.7, hemoglobin 13.4, hematocrit 39.4, platelet count 193.  D-dimer is 0.74.  IMPRESSION:  Ms. Vicki Dennis is a 66 year old African American female with idiopathic portal vein thrombosis.  We have had her on Coumadin now for over a year.  She is able to go off this now and get on to aspirin.  I believe this is a reasonable means of continuing her with respect  to anticoagulation.  I want to see her back in 2 months' time.  I do not think we need to do any scans on her unless we find that she is symptomatic.    ______________________________ [name], M.D. PRE/MEDQ  D:  11/23/2011  T:  11/24/2011  Job:  [id number]

## 2011-12-29 ENCOUNTER — Ambulatory Visit: Payer: Medicare Other | Admitting: Dietician

## 2012-01-05 ENCOUNTER — Encounter: Payer: Medicare Other | Attending: Internal Medicine | Admitting: Dietician

## 2012-01-05 ENCOUNTER — Encounter: Payer: Self-pay | Admitting: Dietician

## 2012-01-05 DIAGNOSIS — E119 Type 2 diabetes mellitus without complications: Secondary | ICD-10-CM | POA: Insufficient documentation

## 2012-01-05 DIAGNOSIS — Z713 Dietary counseling and surveillance: Secondary | ICD-10-CM | POA: Insufficient documentation

## 2012-01-05 NOTE — Progress Notes (Signed)
  Medical Nutrition Therapy:  Appt start time: 0800 end time:  0900   Assessment:  Primary concerns today: Check in for blood glucose and weight status.  Has been traveling and spending time with her children in [place] and the [place] area.  During this time she notes that she uses public transportation and does a great deal more walking.  Since her last visit on 09/15/2011, she has lost 7.9 lbs. Her BMI is at 29.5 kg/m2.  She reports her most recent A1C at 6.1-6.2%.  C/O an abdominal hernia that is increasing in size.  Advised her to have Dr. Parke Simmers to examine it and make a referral if needed.  She assures me she is eating, and eating healthy.  Having regular meals and snacks.  Continues to limit the processed foods, trying to eat more organic produce/products.  Using vegetables, fruits, fish, and poultry on a regular basis.  Enjoys discussing her food shopping adventures.  While traveling, she obviously takes advantage of exploring new food ways.  She is constantly on the search for good, non processed foods.  She sees this as having the potential for helping to keep her healthy and able to enjoy her family and life in [place].  BLOOD GLUCOSE:  Monitoring 1-2 times per day.  Forgot her meter today.  Reports:  Fasting: 96-110 mg    ZO:XWRU 90's    MEDICATIONS: Completed medication review.    Usual physical activity: Continues to try to walk on a daily basis.  Notes it is not as much or as much fun when getting on and off the bus in the city and getting to see new places.  Estimated energy needs:   HT: 66 in  WT: 182.2 lb  BMI: 29.5 kg/m2   1400-1500 calories 160-165 g carbohydrates 105-110 g protein 38-40  g fat  Progress Towards Goal(s):  Some progress.   Nutritional Diagnosis:  Spencer-2.1 Inpaired nutrition utilization As related to blood glucose.  As evidenced by diagnosis of type 2 diabetes with weight gain and difficulty losing weight without limiting carbs and increasing activity and  exercise..    Intervention:  Nutrition Advised to continue to work to keep the fiber in her diet and keep using the lean meats and increased intake of fish along with the whole grains.  Continue to daily use vegetables and fruits.  Monitoring/Evaluation:  Dietary intake, exercise, blood glucose , and body weight in 3-4 months following visit to Dr. Parke Simmers.

## 2012-01-25 ENCOUNTER — Ambulatory Visit (HOSPITAL_BASED_OUTPATIENT_CLINIC_OR_DEPARTMENT_OTHER): Payer: Medicare Other | Admitting: Hematology & Oncology

## 2012-01-25 ENCOUNTER — Other Ambulatory Visit (HOSPITAL_BASED_OUTPATIENT_CLINIC_OR_DEPARTMENT_OTHER): Payer: Medicare Other | Admitting: Lab

## 2012-01-25 VITALS — BP 104/63 | HR 72 | Temp 97.8°F | Resp 18 | Ht 66.0 in | Wt 183.0 lb

## 2012-01-25 DIAGNOSIS — I81 Portal vein thrombosis: Secondary | ICD-10-CM

## 2012-01-25 LAB — COMPREHENSIVE METABOLIC PANEL
Albumin: 4.1 g/dL (ref 3.5–5.2)
CO2: 27 mEq/L (ref 19–32)
Glucose, Bld: 102 mg/dL — ABNORMAL HIGH (ref 70–99)
Sodium: 140 mEq/L (ref 135–145)
Total Bilirubin: 0.5 mg/dL (ref 0.3–1.2)
Total Protein: 6.2 g/dL (ref 6.0–8.3)

## 2012-01-25 LAB — CBC WITH DIFFERENTIAL (CANCER CENTER ONLY)
Eosinophils Absolute: 0.3 10*3/uL (ref 0.0–0.5)
HCT: 38.8 % (ref 34.8–46.6)
LYMPH%: 35.2 % (ref 14.0–48.0)
MCV: 87 fL (ref 81–101)
MONO#: 0.6 10*3/uL (ref 0.1–0.9)
Platelets: 208 10*3/uL (ref 145–400)
RBC: 4.47 10*6/uL (ref 3.70–5.32)
WBC: 5.4 10*3/uL (ref 3.9–10.0)

## 2012-01-25 NOTE — Progress Notes (Signed)
This office note has been dictated.

## 2012-01-26 NOTE — Progress Notes (Signed)
CC:   Vicki Dennis, M.D.  DIAGNOSIS:  Idiopathic portal vein thrombosis.  CURRENT THERAPY:  Aspirin 162 mg p.o. daily.  INTERIM HISTORY:  Vicki Dennis comes in for follow-up.  I previously saw her back in August.  Since then, she has been doing well.  She is off Coumadin.  She is doing well on the aspirin.  Her last scan was done back in June.  At that point time, the CT scan did not show any obvious residual thrombus.  She has had no problem with bowels or bladder.  There has been no leg swelling.  He has had no cough or shortness breath.  She is getting ready to go to [place] next year.  She has learned to speak Swahili.  PHYSICAL EXAMINATION:  General:  This is a well-developed, well- nourished black female in no obvious distress.  Vital Signs: Temperature of 97.8, pulse 72, respiratory rate 18, blood pressure 104/63.  Weight is 183.  Head and Neck:  Normocephalic, atraumatic skull.  There are no ocular or oral lesions.  There are no palpable cervical or supraclavicular lymph nodes.  Lungs:  Clear bilaterally. Cardiac:  Regular rate and rhythm with a normal S1 and S2.  There are no murmurs, rubs, or bruits.  Abdomen:  Soft with good bowel sounds.  There is no palpable abdominal mass.  There is no fluid wave.  There is no palpable hepatosplenomegaly.  Extremities:  No clubbing, cyanosis, or edema.  Skin:  No rash, ecchymosis, or petechia.  Neurological:  No focal neurological deficits.  LABORATORY STUDIES:  White cell count is 5.4, hemoglobin 13.2, hematocrit 38.8, platelet count 208.  IMPRESSION:  Vicki Dennis is a very nice 67 year old African American female with portal vein thrombosis.  She presented with this back in, I think, April 2012.  She was on anticoagulation for 1 year.  She now is on Coumadin.  Her last D-dimer was 0.74.  I do not see need for any scans right now.  She is pretty much asymptomatic.  Will plan to get her back in 3 months' time.  We do not need any  blood work in between visits.    ______________________________ Vicki Dennis, M.D. PRE/MEDQ  D:  01/25/2012  T:  01/26/2012  Job:  [id number]

## 2012-02-29 ENCOUNTER — Encounter (INDEPENDENT_AMBULATORY_CARE_PROVIDER_SITE_OTHER): Payer: Self-pay | Admitting: Surgery

## 2012-02-29 ENCOUNTER — Ambulatory Visit (INDEPENDENT_AMBULATORY_CARE_PROVIDER_SITE_OTHER): Payer: Medicare Other | Admitting: Surgery

## 2012-02-29 VITALS — BP 118/64 | HR 72 | Temp 97.3°F | Resp 20 | Ht 66.0 in | Wt 184.0 lb

## 2012-02-29 DIAGNOSIS — K429 Umbilical hernia without obstruction or gangrene: Secondary | ICD-10-CM

## 2012-02-29 NOTE — Progress Notes (Signed)
Patient ID: Vicki Dennis, female   DOB: October 12, 1945, 66 y.o.   MRN: [id number]  Chief Complaint  Patient presents with  . Umbilical Hernia    HPI Vicki Dennis is a 66 y.o. female.   HPI She is referred by Dr. Parke Simmers for evaluation of an umbilical hernia. This may have been there for some time but is now causing some mild discomfort. This is only mild with no obstructive symptoms.  She is otherwise without complaints. She has been off of Coumadin since the summer for her idiopathic portal vein thrombosis. She does travel quite a bit and often goes out of the country Past Medical History  Diagnosis Date  . Diabetes mellitus   . Hyperlipidemia   . Hypertension   . Portal vein thrombosis 03/09/2011  . Diabetes mellitus 11/05/2010  . Anemia   . Asthma   . Blood transfusion without reported diagnosis     Past Surgical History  Procedure Date  . Knee arthroscopy w/ meniscal repair     both knees    Family History  Problem Relation Age of Onset  . Cancer Father     prostate  . Cancer Maternal Aunt     Social History History  Substance Use Topics  . Smoking status: Never Smoker   . Smokeless tobacco: Never Used  . Alcohol Use: Yes    Allergies  Allergen Reactions  . Sulfa Antibiotics Anaphylaxis    "died 3 times".    Current Outpatient Prescriptions  Medication Sig Dispense Refill  . ACCU-CHEK AVIVA PLUS test strip       . aspirin 81 MG tablet Take 162 mg by mouth daily.      . budesonide-formoterol (SYMBICORT) 80-4.5 MCG/ACT inhaler Inhale 2 puffs into the lungs 2 (two) times daily. Uses with seasonal allergies in the Spring and the Fall.      . cetirizine (ZYRTEC) 10 MG tablet Take 10 mg by mouth daily.      . montelukast (SINGULAIR) 10 MG tablet 10 mg at bedtime.       . Multiple Vitamins-Minerals (MULTIVITAMIN WITH MINERALS) tablet Take 1 tablet by mouth daily.        Marland Kitchen omega-3 acid ethyl esters (LOVAZA) 1 G capsule Take 2 g by mouth 2 (two) times daily.        Marland Kitchen  telmisartan-hydrochlorothiazide (MICARDIS HCT) 80-25 MG per tablet Take by mouth daily. Takes one half tablet.        Review of Systems Review of Systems  Constitutional: Negative for fever, chills and unexpected weight change.  HENT: Negative for hearing loss, congestion, sore throat, trouble swallowing and voice change.   Eyes: Negative for visual disturbance.  Respiratory: Negative for cough and wheezing.   Cardiovascular: Negative for chest pain, palpitations and leg swelling.  Gastrointestinal: Negative for nausea, vomiting, abdominal pain, diarrhea, constipation, blood in stool, abdominal distention and anal bleeding.  Genitourinary: Negative for hematuria, vaginal bleeding and difficulty urinating.  Musculoskeletal: Negative for arthralgias.  Skin: Negative for rash and wound.  Neurological: Negative for seizures, syncope and headaches.  Hematological: Negative for adenopathy. Does not bruise/bleed easily.  Psychiatric/Behavioral: Negative for confusion.    Blood pressure 118/64, pulse 72, temperature 97.3 F (36.3 C), temperature source Temporal, resp. rate 20, height 5\' 6"  (1.676 m), weight 184 lb (83.462 kg).  Physical Exam Physical Exam  Constitutional: She is oriented to person, place, and time. She appears well-developed and well-nourished. No distress.  HENT:  Head: Normocephalic and atraumatic.  Right Ear: External ear normal.  Left Ear: External ear normal.  Nose: Nose normal.  Mouth/Throat: Oropharynx is clear and moist. No oropharyngeal exudate.  Eyes: Conjunctivae normal and EOM are normal. Pupils are equal, round, and reactive to light. Right eye exhibits no discharge. Left eye exhibits no discharge. No scleral icterus.  Neck: Normal range of motion. Neck supple. No tracheal deviation present. No thyromegaly present.  Cardiovascular: Normal rate, regular rhythm, normal heart sounds and intact distal pulses.   No murmur heard. Pulmonary/Chest: Effort normal and  breath sounds normal. No respiratory distress. She has no wheezes. She has no rales.  Abdominal: Soft. Bowel sounds are normal. She exhibits no distension. There is no tenderness. There is no rebound.       Easily reducible hernia just above the umbilicus  Musculoskeletal: Normal range of motion. She exhibits no edema and no tenderness.  Lymphadenopathy:    She has no cervical adenopathy.  Neurological: She is alert and oriented to person, place, and time.  Skin: Skin is warm and dry. No rash noted. No erythema.  Psychiatric: Her behavior is normal. Judgment normal.    Data Reviewed I have seen her previous notes from her hematologist. I have also reviewed her previous CAT scans demonstrating a fat filled hernia at the umbilicus  Assessment     umbilical hernia    Plan    Given her symptoms, repair with mesh was recommended. I have discussed this with her in detail. I discussed the risk of surgery which includes but is not limited to bleeding, infection, recurrence, use of mesh, etc. She understands and wishes to proceed. Surgery will be scheduled. Likelihood of success is good       Kiannah Grunow A 02/29/2012, 2:44 PM

## 2012-03-08 NOTE — Pre-Procedure Instructions (Signed)
20 [name]  03/08/2012   Your procedure is scheduled on:  Wednesday, December 4th.  Report to [facility] at 7:30 AM.  Call this number if you have problems the morning of surgery: [phone]   Remember: Nothing to eat or drink after Midnight.- TUESDAY     Take these medicines the morning of surgery with A SIP OF WATER: Cetirizine (Zyrtec) and use Inhaler.  Do not wear jewelry, make-up or nail polish.  Do not wear lotions, powders, or perfumes. You may wear deodorant.  Do not shave 48 hours prior to surgery  Do not bring valuables to the hospital.  Contacts, dentures or bridgework may not be worn into surgery.  Leave suitcase in the car. After surgery it may be brought to your room.  For patients admitted to the hospital, checkout time is 11:00 AM the day of discharge.   Patients discharged the day of surgery will not be allowed to drive home.  Name and phone number of your driver: FRIEND   Special Instructions: Shower using CHG 2 nights before surgery and the night before surgery.  If you shower the day of surgery use CHG.  Use special wash - you have one bottle of CHG for all showers.  You should use approximately 1/3 of the bottle for each shower.   Please read over the following fact sheets that you were given: Pain Booklet, Coughing and Deep Breathing and Surgical Site Infection Prevention

## 2012-03-09 ENCOUNTER — Ambulatory Visit (HOSPITAL_COMMUNITY)
Admission: RE | Admit: 2012-03-09 | Discharge: 2012-03-09 | Disposition: A | Discharge status: Home / Self Care | Payer: Medicare Other | Source: Ambulatory Visit | Attending: Anesthesiology | Admitting: Anesthesiology

## 2012-03-09 ENCOUNTER — Encounter (HOSPITAL_COMMUNITY)
Admission: RE | Admit: 2012-03-09 | Discharge: 2012-03-09 | Disposition: A | Discharge status: Home / Self Care | Payer: Medicare Other | Source: Ambulatory Visit | Attending: Surgery | Admitting: Surgery

## 2012-03-09 ENCOUNTER — Encounter (HOSPITAL_COMMUNITY): Payer: Self-pay

## 2012-03-09 DIAGNOSIS — I10 Essential (primary) hypertension: Secondary | ICD-10-CM | POA: Insufficient documentation

## 2012-03-09 DIAGNOSIS — Z01810 Encounter for preprocedural cardiovascular examination: Secondary | ICD-10-CM | POA: Insufficient documentation

## 2012-03-09 DIAGNOSIS — Z01812 Encounter for preprocedural laboratory examination: Secondary | ICD-10-CM | POA: Insufficient documentation

## 2012-03-09 DIAGNOSIS — Z01818 Encounter for other preprocedural examination: Secondary | ICD-10-CM | POA: Insufficient documentation

## 2012-03-09 DIAGNOSIS — K429 Umbilical hernia without obstruction or gangrene: Secondary | ICD-10-CM | POA: Insufficient documentation

## 2012-03-09 HISTORY — DX: Pneumonia, unspecified organism: J18.9

## 2012-03-09 HISTORY — DX: Gastro-esophageal reflux disease without esophagitis: K21.9

## 2012-03-09 LAB — BASIC METABOLIC PANEL
GFR calc Af Amer: >90 mL/min (ref >90)
GFR calc non Af Amer: 84 mL/min — ABNORMAL LOW (ref >90)
Potassium: 4.2 mEq/L (ref 3.5–5.1)
Sodium: 140 mEq/L (ref 135–145)

## 2012-03-09 LAB — CBC
Hemoglobin: 13.6 g/dL (ref 12.0–15.0)
RBC: 4.68 MIL/uL (ref 3.87–5.11)
WBC: 6.8 10*3/uL (ref 4.0–10.5)

## 2012-03-09 LAB — SURGICAL PCR SCREEN: Staphylococcus aureus: NEGATIVE

## 2012-03-15 MED ORDER — CEFAZOLIN SODIUM-DEXTROSE 2-3 GM-% IV SOLR
2.0000 g | INTRAVENOUS | Status: AC
  Administered 2012-03-16: 2 g via INTRAVENOUS
  Filled 2012-03-15: qty 50

## 2012-03-15 NOTE — H&P (Signed)
Patient ID: Vicki Dennis, female DOB: 07/29/1945, 66 y.o. MRN: [id number]  Chief Complaint   Patient presents with   .  Umbilical Hernia    HPI  Vicki Dennis is a 66 y.o. female.  HPI  She is referred by Dr. Parke Simmers for evaluation of an umbilical hernia. This may have been there for some time but is now causing some mild discomfort. This is only mild with no obstructive symptoms. She is otherwise without complaints. She has been off of Coumadin since the summer for her idiopathic portal vein thrombosis. She does travel quite a bit and often goes out of the country  Past Medical History   Diagnosis  Date   .  Diabetes mellitus    .  Hyperlipidemia    .  Hypertension    .  Portal vein thrombosis  03/09/2011   .  Diabetes mellitus  11/05/2010   .  Anemia    .  Asthma    .  Blood transfusion without reported diagnosis     Past Surgical History   Procedure  Date   .  Knee arthroscopy w/ meniscal repair      both knees    Family History   Problem  Relation  Age of Onset   .  Cancer  Father       prostate    .  Cancer  Maternal Aunt     Social History  History   Substance Use Topics   .  Smoking status:  Never Smoker   .  Smokeless tobacco:  Never Used   .  Alcohol Use:  Yes    Allergies   Allergen  Reactions   .  Sulfa Antibiotics  Anaphylaxis     "died 3 times".    Current Outpatient Prescriptions   Medication  Sig  Dispense  Refill   .  ACCU-CHEK AVIVA PLUS test strip      .  aspirin 81 MG tablet  Take 162 mg by mouth daily.     .  budesonide-formoterol (SYMBICORT) 80-4.5 MCG/ACT inhaler  Inhale 2 puffs into the lungs 2 (two) times daily. Uses with seasonal allergies in the Spring and the Fall.     .  cetirizine (ZYRTEC) 10 MG tablet  Take 10 mg by mouth daily.     .  montelukast (SINGULAIR) 10 MG tablet  10 mg at bedtime.     .  Multiple Vitamins-Minerals (MULTIVITAMIN WITH MINERALS) tablet  Take 1 tablet by mouth daily.     Marland Kitchen  omega-3 acid ethyl esters (LOVAZA) 1 G  capsule  Take 2 g by mouth 2 (two) times daily.     Marland Kitchen  telmisartan-hydrochlorothiazide (MICARDIS HCT) 80-25 MG per tablet  Take by mouth daily. Takes one half tablet.      Review of Systems  Review of Systems  Constitutional: Negative for fever, chills and unexpected weight change.  HENT: Negative for hearing loss, congestion, sore throat, trouble swallowing and voice change.  Eyes: Negative for visual disturbance.  Respiratory: Negative for cough and wheezing.  Cardiovascular: Negative for chest pain, palpitations and leg swelling.  Gastrointestinal: Negative for nausea, vomiting, abdominal pain, diarrhea, constipation, blood in stool, abdominal distention and anal bleeding.  Genitourinary: Negative for hematuria, vaginal bleeding and difficulty urinating.  Musculoskeletal: Negative for arthralgias.  Skin: Negative for rash and wound.  Neurological: Negative for seizures, syncope and headaches.  Hematological: Negative for adenopathy. Does not bruise/bleed easily.  Psychiatric/Behavioral: Negative for confusion.  Blood pressure 118/64, pulse 72, temperature 97.3 F (36.3 C), temperature source Temporal, resp. rate 20, height 5\' 6"  (1.676 m), weight 184 lb (83.462 kg).  Physical Exam  Physical Exam  Constitutional: She is oriented to person, place, and time. She appears well-developed and well-nourished. No distress.  HENT:  Head: Normocephalic and atraumatic.  Right Ear: External ear normal.  Left Ear: External ear normal.  Nose: Nose normal.  Mouth/Throat: Oropharynx is clear and moist. No oropharyngeal exudate.  Eyes: Conjunctivae normal and EOM are normal. Pupils are equal, round, and reactive to light. Right eye exhibits no discharge. Left eye exhibits no discharge. No scleral icterus.  Neck: Normal range of motion. Neck supple. No tracheal deviation present. No thyromegaly present.  Cardiovascular: Normal rate, regular rhythm, normal heart sounds and intact distal pulses.  No  murmur heard.  Pulmonary/Chest: Effort normal and breath sounds normal. No respiratory distress. She has no wheezes. She has no rales.  Abdominal: Soft. Bowel sounds are normal. She exhibits no distension. There is no tenderness. There is no rebound.  Easily reducible hernia just above the umbilicus  Musculoskeletal: Normal range of motion. She exhibits no edema and no tenderness.  Lymphadenopathy:  She has no cervical adenopathy.  Neurological: She is alert and oriented to person, place, and time.  Skin: Skin is warm and dry. No rash noted. No erythema.  Psychiatric: Her behavior is normal. Judgment normal.   Data Reviewed  I have seen her previous notes from her hematologist. I have also reviewed her previous CAT scans demonstrating a fat filled hernia at the umbilicus  Assessment   umbilical hernia   Plan   Given her symptoms, repair with mesh was recommended. I have discussed this with her in detail. I discussed the risk of surgery which includes but is not limited to bleeding, infection, recurrence, use of mesh, etc. She understands and wishes to proceed. Surgery will be scheduled. Likelihood of success is good

## 2012-03-16 ENCOUNTER — Encounter (HOSPITAL_COMMUNITY)
Admission: RE | Disposition: A | Discharge status: Home / Self Care | Payer: Self-pay | Source: Ambulatory Visit | Attending: Surgery

## 2012-03-16 ENCOUNTER — Encounter (HOSPITAL_COMMUNITY): Payer: Self-pay | Admitting: Critical Care Medicine

## 2012-03-16 ENCOUNTER — Ambulatory Visit (HOSPITAL_COMMUNITY): Payer: Medicare Other | Admitting: Critical Care Medicine

## 2012-03-16 ENCOUNTER — Ambulatory Visit (HOSPITAL_COMMUNITY)
Admission: RE | Admit: 2012-03-16 | Discharge: 2012-03-16 | Disposition: A | Discharge status: Home / Self Care | Payer: Medicare Other | Source: Ambulatory Visit | Attending: Surgery | Admitting: Surgery

## 2012-03-16 DIAGNOSIS — K429 Umbilical hernia without obstruction or gangrene: Secondary | ICD-10-CM | POA: Insufficient documentation

## 2012-03-16 DIAGNOSIS — J45909 Unspecified asthma, uncomplicated: Secondary | ICD-10-CM | POA: Insufficient documentation

## 2012-03-16 DIAGNOSIS — E119 Type 2 diabetes mellitus without complications: Secondary | ICD-10-CM | POA: Insufficient documentation

## 2012-03-16 DIAGNOSIS — I10 Essential (primary) hypertension: Secondary | ICD-10-CM | POA: Insufficient documentation

## 2012-03-16 DIAGNOSIS — K219 Gastro-esophageal reflux disease without esophagitis: Secondary | ICD-10-CM | POA: Insufficient documentation

## 2012-03-16 HISTORY — PX: UMBILICAL HERNIA REPAIR: SHX196

## 2012-03-16 HISTORY — PX: INSERTION OF MESH: SHX5868

## 2012-03-16 LAB — GLUCOSE, CAPILLARY: Glucose-Capillary: 125 mg/dL — ABNORMAL HIGH (ref 70–99)

## 2012-03-16 SURGERY — REPAIR, HERNIA, UMBILICAL, ADULT
Anesthesia: General | Site: Abdomen | Wound class: Clean

## 2012-03-16 MED ORDER — SUCCINYLCHOLINE CHLORIDE 20 MG/ML IJ SOLN
INTRAMUSCULAR | Status: DC | PRN
  Administered 2012-03-16: 80 mg via INTRAVENOUS
  Administered 2012-03-16: 20 mg via INTRAVENOUS

## 2012-03-16 MED ORDER — ARTIFICIAL TEARS OP OINT
TOPICAL_OINTMENT | OPHTHALMIC | Status: DC | PRN
  Administered 2012-03-16: 1 via OPHTHALMIC

## 2012-03-16 MED ORDER — PHENYLEPHRINE HCL 10 MG/ML IJ SOLN
INTRAMUSCULAR | Status: DC | PRN
  Administered 2012-03-16: 80 ug via INTRAVENOUS

## 2012-03-16 MED ORDER — ONDANSETRON HCL 4 MG/2ML IJ SOLN
INTRAMUSCULAR | Status: DC | PRN
  Administered 2012-03-16: 4 mg via INTRAVENOUS

## 2012-03-16 MED ORDER — OXYCODONE HCL 5 MG PO TABS
ORAL_TABLET | ORAL | Status: AC
  Filled 2012-03-16: qty 2

## 2012-03-16 MED ORDER — PROPOFOL 10 MG/ML IV BOLUS
INTRAVENOUS | Status: DC | PRN
  Administered 2012-03-16: 200 mg via INTRAVENOUS

## 2012-03-16 MED ORDER — NEOSTIGMINE METHYLSULFATE 1 MG/ML IJ SOLN
INTRAMUSCULAR | Status: DC | PRN
  Administered 2012-03-16: 3 mg via INTRAVENOUS

## 2012-03-16 MED ORDER — ONDANSETRON HCL 4 MG/2ML IJ SOLN: 4.0000 mg | Freq: Once | INTRAMUSCULAR | Status: DC | PRN

## 2012-03-16 MED ORDER — LIDOCAINE HCL (CARDIAC) 20 MG/ML IV SOLN
INTRAVENOUS | Status: DC | PRN
  Administered 2012-03-16: 80 mg via INTRAVENOUS

## 2012-03-16 MED ORDER — KETOROLAC TROMETHAMINE 30 MG/ML IJ SOLN
INTRAMUSCULAR | Status: DC | PRN
  Administered 2012-03-16: 30 mg via INTRAVENOUS

## 2012-03-16 MED ORDER — BUPIVACAINE HCL (PF) 0.25 % IJ SOLN
INTRAMUSCULAR | Status: DC | PRN
  Administered 2012-03-16: 20 mL

## 2012-03-16 MED ORDER — LIDOCAINE HCL 4 % MT SOLN
OROMUCOSAL | Status: DC | PRN
  Administered 2012-03-16: 4 mL via TOPICAL

## 2012-03-16 MED ORDER — LACTATED RINGERS IV SOLN
INTRAVENOUS | Status: DC
  Administered 2012-03-16: 10:00:00 via INTRAVENOUS

## 2012-03-16 MED ORDER — ROCURONIUM BROMIDE 100 MG/10ML IV SOLN
INTRAVENOUS | Status: DC | PRN
  Administered 2012-03-16: 20 mg via INTRAVENOUS

## 2012-03-16 MED ORDER — OXYCODONE HCL 5 MG PO TABS
5.0000 mg | ORAL_TABLET | ORAL | Status: DC | PRN
  Administered 2012-03-16: 10 mg via ORAL

## 2012-03-16 MED ORDER — 0.9 % SODIUM CHLORIDE (POUR BTL) OPTIME
TOPICAL | Status: DC | PRN
  Administered 2012-03-16: 1000 mL

## 2012-03-16 MED ORDER — OXYCODONE-ACETAMINOPHEN 5-325 MG PO TABS: ORAL_TABLET | ORAL | Status: DC

## 2012-03-16 MED ORDER — MIDAZOLAM HCL 5 MG/5ML IJ SOLN
INTRAMUSCULAR | Status: DC | PRN
  Administered 2012-03-16: 2 mg via INTRAVENOUS

## 2012-03-16 MED ORDER — GLYCOPYRROLATE 0.2 MG/ML IJ SOLN
INTRAMUSCULAR | Status: DC | PRN
  Administered 2012-03-16: 0.4 mg via INTRAVENOUS

## 2012-03-16 MED ORDER — BUPIVACAINE HCL (PF) 0.25 % IJ SOLN
INTRAMUSCULAR | Status: AC
  Filled 2012-03-16: qty 30

## 2012-03-16 MED ORDER — FENTANYL CITRATE 0.05 MG/ML IJ SOLN
INTRAMUSCULAR | Status: DC | PRN
  Administered 2012-03-16: 125 ug via INTRAVENOUS

## 2012-03-16 MED ORDER — HYDROMORPHONE HCL PF 1 MG/ML IJ SOLN
INTRAMUSCULAR | Status: AC
  Filled 2012-03-16: qty 1

## 2012-03-16 MED ORDER — HYDROMORPHONE HCL PF 1 MG/ML IJ SOLN
0.2500 mg | INTRAMUSCULAR | Status: DC | PRN
  Administered 2012-03-16 (×3): 0.25 mg via INTRAVENOUS

## 2012-03-16 SURGICAL SUPPLY — 35 items
BENZOIN TINCTURE PRP APPL 2/3 (GAUZE/BANDAGES/DRESSINGS) IMPLANT
BLADE SURG ROTATE 9660 (MISCELLANEOUS) IMPLANT
CANISTER SUCTION 2500CC (MISCELLANEOUS) IMPLANT
CHLORAPREP W/TINT 26ML (MISCELLANEOUS) ×2 IMPLANT
CLOTH BEACON ORANGE TIMEOUT ST (SAFETY) ×2 IMPLANT
COVER SURGICAL LIGHT HANDLE (MISCELLANEOUS) ×2 IMPLANT
DECANTER SPIKE VIAL GLASS SM (MISCELLANEOUS) IMPLANT
DRAPE PED LAPAROTOMY (DRAPES) ×2 IMPLANT
DRSG TEGADERM 4X4.75 (GAUZE/BANDAGES/DRESSINGS) IMPLANT
ELECT REM PT RETURN 9FT ADLT (ELECTROSURGICAL) ×2
ELECTRODE REM PT RTRN 9FT ADLT (ELECTROSURGICAL) ×1 IMPLANT
GAUZE SPONGE 2X2 8PLY STRL LF (GAUZE/BANDAGES/DRESSINGS) IMPLANT
GLOVE BIO SURGEON STRL SZ7.5 (GLOVE) ×2 IMPLANT
GLOVE BIOGEL PI IND STRL 7.5 (GLOVE) ×1 IMPLANT
GLOVE BIOGEL PI INDICATOR 7.5 (GLOVE) ×1
GLOVE SURG SIGNA 7.5 PF LTX (GLOVE) ×2 IMPLANT
GLOVE SURG SS PI 7.0 STRL IVOR (GLOVE) ×2 IMPLANT
GOWN PREVENTION PLUS XLARGE (GOWN DISPOSABLE) ×2 IMPLANT
GOWN STRL NON-REIN LRG LVL3 (GOWN DISPOSABLE) ×2 IMPLANT
KIT BASIN OR (CUSTOM PROCEDURE TRAY) ×2 IMPLANT
KIT ROOM TURNOVER OR (KITS) ×2 IMPLANT
NEEDLE HYPO 25GX1X1/2 BEV (NEEDLE) ×2 IMPLANT
NS IRRIG 1000ML POUR BTL (IV SOLUTION) ×2 IMPLANT
PACK GENERAL/GYN (CUSTOM PROCEDURE TRAY) ×2 IMPLANT
PAD ARMBOARD 7.5X6 YLW CONV (MISCELLANEOUS) ×2 IMPLANT
PATCH VENTRAL SMALL 4.3 (Mesh Specialty) ×2 IMPLANT
SPONGE GAUZE 2X2 STER 10/PKG (GAUZE/BANDAGES/DRESSINGS)
STRIP CLOSURE SKIN 1/2X4 (GAUZE/BANDAGES/DRESSINGS) IMPLANT
SUT MNCRL AB 4-0 PS2 18 (SUTURE) ×2 IMPLANT
SUT NOVA NAB DX-16 0-1 5-0 T12 (SUTURE) ×2 IMPLANT
SUT VIC AB 3-0 SH 27 (SUTURE) ×1
SUT VIC AB 3-0 SH 27X BRD (SUTURE) ×1 IMPLANT
SYR CONTROL 10ML LL (SYRINGE) ×2 IMPLANT
TOWEL OR 17X24 6PK STRL BLUE (TOWEL DISPOSABLE) ×2 IMPLANT
TOWEL OR 17X26 10 PK STRL BLUE (TOWEL DISPOSABLE) ×2 IMPLANT

## 2012-03-16 NOTE — Anesthesia Procedure Notes (Signed)
Procedure Name: Intubation Date/Time: 03/16/2012 10:18 AM Performed by: Elon Alas Pre-anesthesia Checklist: Emergency Drugs available, Patient identified, Timeout performed, Suction available and Patient being monitored Patient Re-evaluated:Patient Re-evaluated prior to inductionOxygen Delivery Method: Circle system utilized Preoxygenation: Pre-oxygenation with 100% oxygen Intubation Type: IV induction Ventilation: Mask ventilation without difficulty Laryngoscope Size: Mac and 3 Grade View: Grade II Tube type: Oral Tube size: 7.0 mm Number of attempts: 1 Airway Equipment and Method: Stylet and LTA kit utilized Placement Confirmation: positive ETCO2,  ETT inserted through vocal cords under direct vision and breath sounds checked- equal and bilateral Secured at: 21 cm Tube secured with: Tape Dental Injury: Teeth and Oropharynx as per pre-operative assessment

## 2012-03-16 NOTE — Anesthesia Postprocedure Evaluation (Signed)
  Anesthesia Post-op Note  Patient: Vicki Dennis  Procedure(s) Performed: Procedure(s) (LRB) with comments: HERNIA REPAIR UMBILICAL ADULT (N/A) INSERTION OF MESH (N/A)  Patient Location: PACU  Anesthesia Type:General  Level of Consciousness: awake, oriented and patient cooperative  Airway and Oxygen Therapy: Patient Spontanous Breathing  Post-op Pain: mild  Post-op Assessment: Post-op Vital signs reviewed, Patient's Cardiovascular Status Stable, Respiratory Function Stable, Patent Airway, No signs of Nausea or vomiting and Pain level controlled  Post-op Vital Signs: stable  Complications: No apparent anesthesia complications

## 2012-03-16 NOTE — Op Note (Signed)
HERNIA REPAIR UMBILICAL ADULT, INSERTION OF MESH  Procedure Note  RAINE BLODGETT 03/16/2012   Pre-op Diagnosis: umbilical hernia     Post-op Diagnosis: SAME  Procedure(s): HERNIA REPAIR UMBILICAL ADULT INSERTION OF MESH (4 cm round v-patch)  Surgeon(s): Shelly Rubenstein, MD  Anesthesia: General  Staff:  Eppie Gibson, RN - Circulator Janeece Agee Pingue, CST - Scrub Person Megan Day Cavanaugh, RN - Circulator Assistant  Estimated Blood Loss: Minimal               Procedure: The patient was brought to the operating room and identified as the correct patient. She was placed supine on the operative table and general anesthesia was induced. Her abdomen was then prepped and draped in the usual sterile fashion. I made a small incision above the umbilicus with a scalpel. I did this down to the hernia sac which contained preperitoneal fat. I excised the entire sac at the level of the fascia. I then brought a 4 cm round mesh patch onto the field. This was a V-patch.  The mesh was placed through the fascial opening and pulled up against the peritoneal surface with the stay ties. I sewed the mesh in place with 4 separate #1 Novafil sutures. I then cut the stay ties and closed the fascia over top of the mesh with a figure-of-eight #1 Novafil suture. I then anesthetized the fascia and skin with Marcaine. I closed the subcutaneous tissue with interrupted 3-0 Vicryl sutures and closed the skin with a 4-0 Monocryl. Steri-Strips, gauze, and Tegaderm were then applied. The patient tolerated the procedure well. All the counts were correct at the end of the procedure. The patient was then extubated in the operating room and taken in a stable condition to the recovery room.          Laelani Vasko A   Date: 03/16/2012  Time: 10:47 AM

## 2012-03-16 NOTE — Interval H&P Note (Signed)
History and Physical Interval Note:  She has had no change in her H and P  03/16/2012 8:10 AM  Vicki Dennis  has presented today for surgery, with the diagnosis of umbilical hernia  The various methods of treatment have been discussed with the patient and family. After consideration of risks, benefits and other options for treatment, the patient has consented to  Procedure(s) (LRB) with comments: HERNIA REPAIR UMBILICAL ADULT (N/Dennis) INSERTION OF MESH (N/Dennis) as Dennis surgical intervention .  The patient's history has been reviewed, patient examined, no change in status, stable for surgery.  I have reviewed the patient's chart and labs.  Questions were answered to the patient's satisfaction.     Vicki Dennis

## 2012-03-16 NOTE — Preoperative (Signed)
Beta Blockers   Reason not to administer Beta Blockers:Not Applicable 

## 2012-03-16 NOTE — Transfer of Care (Signed)
Immediate Anesthesia Transfer of Care Note  Patient: Vicki Dennis  Procedure(s) Performed: Procedure(s) (LRB) with comments: HERNIA REPAIR UMBILICAL ADULT (N/A) INSERTION OF MESH (N/A)  Patient Location: PACU  Anesthesia Type:General  Level of Consciousness: awake, alert  and oriented  Airway & Oxygen Therapy: Patient Spontanous Breathing and Patient connected to nasal cannula oxygen  Post-op Assessment: Report given to PACU RN, Post -op Vital signs reviewed and stable and Patient moving all extremities X 4  Post vital signs: Reviewed and stable  Complications: No apparent anesthesia complications

## 2012-03-16 NOTE — Anesthesia Preprocedure Evaluation (Addendum)
Anesthesia Evaluation  Patient identified by MRN, date of birth, ID band Patient awake    Reviewed: Allergy & Precautions, H&P , NPO status , Patient's Chart, lab work & pertinent test results  Airway Mallampati: II TM Distance: >3 FB Neck ROM: Full    Dental  (+) Dental Advisory Given and Teeth Intact   Pulmonary asthma ,          Cardiovascular hypertension,     Neuro/Psych    GI/Hepatic GERD-  ,  Endo/Other  diabetes  Renal/GU      Musculoskeletal   Abdominal   Peds  Hematology   Anesthesia Other Findings   Reproductive/Obstetrics                          Anesthesia Physical Anesthesia Plan  ASA: III  Anesthesia Plan: General   Post-op Pain Management:    Induction: Intravenous  Airway Management Planned: Oral ETT  Additional Equipment:   Intra-op Plan:   Post-operative Plan: Extubation in OR  Informed Consent: I have reviewed the patients History and Physical, chart, labs and discussed the procedure including the risks, benefits and alternatives for the proposed anesthesia with the patient or authorized representative who has indicated his/her understanding and acceptance.   Dental advisory given  Plan Discussed with: Anesthesiologist and Surgeon  Anesthesia Plan Comments:         Anesthesia Quick Evaluation

## 2012-03-17 ENCOUNTER — Encounter (HOSPITAL_COMMUNITY): Payer: Self-pay | Admitting: Surgery

## 2012-03-21 ENCOUNTER — Telehealth (INDEPENDENT_AMBULATORY_CARE_PROVIDER_SITE_OTHER): Payer: Self-pay | Admitting: General Surgery

## 2012-03-21 ENCOUNTER — Telehealth (INDEPENDENT_AMBULATORY_CARE_PROVIDER_SITE_OTHER): Payer: Self-pay

## 2012-03-21 NOTE — Telephone Encounter (Signed)
The pt called reporting she has an itchy rash on her arms, neck, chest, and abdomen.  She had surgery 12/4 and she noticed the rash Friday.  She feels it is from the Percocet and she called the Anesthesia department Saturday.  They had her stop the Percocet.  She has not tried anything for the rash yet.  I advised try Benadryl po every  6 hrs as needed and she can apply Benadryl cream to the areas not near her incision.  She has not changed her soaps or detergents.  I told her she can use Tylenol or Ibuprofen for pain if needed but she doesn't have much pain right now.  Please advise if she needs to do anything else or be seen this week.

## 2012-03-21 NOTE — Telephone Encounter (Signed)
I called patient back and talked to her about her rash and she stated that once she put the benadryl on the area it was helping. She is aware that she needs to call if the area gets worse

## 2012-04-04 ENCOUNTER — Encounter (INDEPENDENT_AMBULATORY_CARE_PROVIDER_SITE_OTHER): Payer: Self-pay | Admitting: Surgery

## 2012-04-04 ENCOUNTER — Ambulatory Visit (INDEPENDENT_AMBULATORY_CARE_PROVIDER_SITE_OTHER): Payer: Medicare Other | Admitting: Surgery

## 2012-04-04 VITALS — BP 132/84 | HR 76 | Resp 18 | Ht 66.0 in | Wt 188.0 lb

## 2012-04-04 DIAGNOSIS — Z09 Encounter for follow-up examination after completed treatment for conditions other than malignant neoplasm: Secondary | ICD-10-CM

## 2012-04-04 NOTE — Progress Notes (Signed)
Subjective:     Patient ID: Vicki Dennis, female   DOB: Sep 10, 1945, 66 y.o.   MRN: [id number]  HPI She is here for her first postoperative visit status post umbilical hernia repair with mesh.  She has no complaints  Review of Systems     Objective:   Physical Exam On exam, her incisions are well-healed. There is no evidence of recurrent hernia or infection    Assessment:     Patient stable postop    Plan:     She will refrain from heavy lifting for 1-1/2 more weeks. She may then resume normal activity. I will see her back as needed

## 2012-05-02 ENCOUNTER — Other Ambulatory Visit (HOSPITAL_BASED_OUTPATIENT_CLINIC_OR_DEPARTMENT_OTHER): Payer: Medicare Other | Admitting: Lab

## 2012-05-02 ENCOUNTER — Telehealth: Payer: Self-pay | Admitting: Hematology & Oncology

## 2012-05-02 ENCOUNTER — Ambulatory Visit (HOSPITAL_BASED_OUTPATIENT_CLINIC_OR_DEPARTMENT_OTHER): Payer: Medicare Other | Admitting: Hematology & Oncology

## 2012-05-02 VITALS — BP 116/70 | HR 73 | Temp 97.8°F | Resp 18 | Wt 189.0 lb

## 2012-05-02 DIAGNOSIS — I81 Portal vein thrombosis: Secondary | ICD-10-CM

## 2012-05-02 DIAGNOSIS — Z86718 Personal history of other venous thrombosis and embolism: Secondary | ICD-10-CM

## 2012-05-02 LAB — CBC WITH DIFFERENTIAL (CANCER CENTER ONLY)
BASO#: 0.1 10*3/uL (ref 0.0–0.2)
Eosinophils Absolute: 0.2 10*3/uL (ref 0.0–0.5)
HCT: 38.4 % (ref 34.8–46.6)
HGB: 13 g/dL (ref 11.6–15.9)
LYMPH%: 26.6 % (ref 14.0–48.0)
MCV: 87 fL (ref 81–101)
MONO#: 0.7 10*3/uL (ref 0.1–0.9)
NEUT%: 59.2 % (ref 39.6–80.0)
RBC: 4.4 10*6/uL (ref 3.70–5.32)
WBC: 6.9 10*3/uL (ref 3.9–10.0)

## 2012-05-02 LAB — COMPREHENSIVE METABOLIC PANEL
CO2: 27 mEq/L (ref 19–32)
Calcium: 9.6 mg/dL (ref 8.4–10.5)
Chloride: 104 mEq/L (ref 96–112)
Creatinine, Ser: 0.82 mg/dL (ref 0.50–1.10)
Glucose, Bld: 97 mg/dL (ref 70–99)
Total Bilirubin: 0.4 mg/dL (ref 0.3–1.2)

## 2012-05-02 LAB — D-DIMER, QUANTITATIVE: D-Dimer, Quant: 0.62 ug/mL-FEU — ABNORMAL HIGH (ref 0.00–0.48)

## 2012-05-02 NOTE — Telephone Encounter (Signed)
Pt moved 4-21 to 4-28

## 2012-05-02 NOTE — Progress Notes (Signed)
CC:   Vicki Dennis, M.D.  DIAGNOSIS:  Idiopathic portal vein thrombosis.  CURRENT THERAPY:  Aspirin 162 mg p.o. daily.  INTERIM HISTORY:  Vicki Dennis comes in for followup.  We last saw her back in October.  Since then, she has had abdominal wall hernia surgery.  Dr. Magnus Ivan did this.  This was done as an outpatient.  This was done via laparotomy.  He made a small incision right above the umbilicus.  A 4 cm patch was placed.  She tolerated the procedure well.  She has done well with the aspirin.  She has had no issues with bleeding.  She did have an allergic reaction to OxyContin.  We have noted this on her chart.  She will go to [place] sometime this year.  It sounds like she will be there maybe during the summer or in the fall.  She has had no problems with her legs.  There has been no leg swelling.  She has had no joint aches or pains.  She has had no cough or shortness breath.  PHYSICAL EXAMINATION:  This is a well-developed, well-nourished [place] American female in no obvious distress.  Vital signs:  Temperature 97.8, pulse 73, respiratory rate 18, blood pressure 116/70.  Weight is 189. Head/neck:  Normocephalic, atraumatic skull.  There are no ocular or oral lesions.  There are no palpable cervical or supraclavicular lymph nodes.  Lungs:  Clear bilaterally.  Cardiac:  Regular rate and rhythm with a normal S1 and S2.  There are no murmurs, rubs or bruits. Abdomen:  Soft with good bowel sounds.  There is no palpable abdominal mass.  There is no palpable hepatosplenomegaly.  She does have a slight abdominal wall bulge above the umbilicus.  There is the laparotomy scar just above the umbilicus, which is well-healed.  There is no palpable hepatosplenomegaly.  Extremities:  No clubbing, cyanosis or edema. Neurological:  No focal neurological deficits.  Skin:  No rash, ecchymosis, or petechia.  LABORATORY STUDIES:  White cell count 6.9, hemoglobin 13, hematocrit 38.4, platelet  count 202.  IMPRESSION:  Vicki Dennis is a 67 year old black female with a history of idiopathic portal vein thrombosis.  We checked her extensively for hypercoagulable issues, and none were found.  She has been placed on Coumadin for 1 year.  She completed Coumadin in April of 2012.  She is doing well on aspirin.  I do not see any issues from my point of view with coagulation.  We will go ahead and plan to get her back in another 3 months.    ______________________________ [name], M.D. PRE/MEDQ  D:  05/02/2012  T:  05/02/2012  Job:  [id number]

## 2012-05-02 NOTE — Progress Notes (Signed)
This office note has been dictated.

## 2012-05-03 ENCOUNTER — Encounter: Payer: Self-pay | Admitting: Dietician

## 2012-05-03 ENCOUNTER — Encounter: Payer: Medicare Other | Attending: Family Medicine | Admitting: Dietician

## 2012-05-03 DIAGNOSIS — Z713 Dietary counseling and surveillance: Secondary | ICD-10-CM | POA: Insufficient documentation

## 2012-05-03 DIAGNOSIS — E119 Type 2 diabetes mellitus without complications: Secondary | ICD-10-CM | POA: Insufficient documentation

## 2012-05-03 NOTE — Patient Instructions (Addendum)
   Consider getting your dilated eye exam sometime in the next few week.  Continue to do your walking indoors.  Check feet daily for S/S  Of infection.  Continue with the monitoring your blood glucose one to two times daily.  Call me the results of A1C.  Aim for staying active and monitoring your intake to facilitate weight loss.  When weather permits, spend some time in the yard.

## 2012-05-03 NOTE — Progress Notes (Signed)
  Medical Nutrition Therapy:  Appt start time: 0800 end time:  0830.  Assessment:  Primary concerns today: Comes today for f/u for DM and weight maintaence. Since last appointment on 01/05/2012, she has had an umbilical hernia repair on 12/042014.  Pot-op her exercise/activities have been limited.  This with the holiday eating has led to a 6.6 lb weight gain.  She is anxious to be more active and for spring to come so she can get her yard work done and play tennis.  HYPOGLYCEMIA: Denies S/S of low blood glucose,  HYPERGLYCEMIA:  Denies S/S of high blood glucose  BLOOD GLUCOSE:    Fasting: 99, 10195,93,100,100,97,81   AC Dinner:  91   PC Dinner;120  HS:  110, [phone]   DILATED EYE EXAM: Has not had an exam in the last 12 months.  Foot Checks:  Daily examines her feet and applies lotion.  MEDICATIONS: Med review completed  DIETARY INTAKE:  24-hr recall:  B ( AM): flax cereal, or  oatmeal with milk    OR poached egg with [place] bacon and toast and OJ diluted with water..  Snk (mid AM)  Home, none if and working apple slices, walnuts or Lance crackers L (Mid PM): Sandwich with fruit and walnuts Snk (mid PM): water D (5:30-6:00 PM): bean soup/chili and salad, corn bread, slice ham.  Snk (late PM): PB and toasted bread, beer Beverages: water, diluted juice, milk (organic)  Recent physical activity: Limited due to recent umbilical hernia repair.  Currently has been cleared for walking.  She is about starting a regular walking regimen.  She is to start low and slow to recondition  Estimated energy needs:  HT: 66 in  WT: 188.8 Lb  BMI: 30.5 kg/m2   Adj WT:  150 lb (68 kg) 1400-1500 calories 160-165 g carbohydrates 105-110 g protein 38-40 g fat  Progress Towards Goal(s):  Some progress.   Nutritional Diagnosis:  [place]-2.1 Inpaired nutrition utilization As related to blood glucose.  As evidenced by diagnosis of type 2 diabetes, weight gain and difficulty losing  weight without limiting carbs and increasing exercise and activity.. Intervention:  Nutrition Notes that during the holidays, she tended to not stick to her diet.  She is back on track and more closely monitoring her portions.  Continues to try to do as many foods that are organic as possible.  Does depend a great deal on her exercise/activity to help with glucose control and weight loss.   onsider getting your dilated eye exam sometime in the next few week.  Continue to do your walking indoors.  Check feet daily for S/S  Of infection.  Continue with the monitoring your blood glucose one to two times daily.  Call me the results of A1C.  Aim for staying active and monitoring your intake to facilitate weight loss.  When weather permits, spend some time in the yard.  Handouts given during visit include:  Blood glucose log  Monitoring/Evaluation:  Dietary intake, exercise, blood glucose levels and weight in 12 -16 weeks.  To call for appointment.Marland Kitchen

## 2012-06-22 ENCOUNTER — Ambulatory Visit (INDEPENDENT_AMBULATORY_CARE_PROVIDER_SITE_OTHER): Payer: 59 | Admitting: Surgery

## 2012-06-22 VITALS — BP 123/80 | HR 68 | Temp 98.6°F | Resp 14 | Ht 66.0 in | Wt 191.4 lb

## 2012-06-22 DIAGNOSIS — M6208 Separation of muscle (nontraumatic), other site: Secondary | ICD-10-CM

## 2012-06-22 DIAGNOSIS — Z09 Encounter for follow-up examination after completed treatment for conditions other than malignant neoplasm: Secondary | ICD-10-CM | POA: Insufficient documentation

## 2012-06-22 NOTE — Progress Notes (Signed)
Subjective:     Patient ID: Vicki Dennis, female   DOB: January 07, 1946, 67 y.o.   MRN: [id number]  HPI She is status post umbilical hernia repair with mesh in December. She has noticed a bulge above the umbilicus and was concerned. She has no discomfort.  Review of Systems     Objective:   Physical Exam She on exam has fairly pronounced rectus diastases. I do not feel a hernia at the umbilicus for the mesh was placed but there may be one above this    Assessment:     Rectus diastases     Plan:     I will reexamine her in 3 months to see if this area persists. She will come back sooner should she develop discomfort

## 2012-07-12 ENCOUNTER — Encounter: Payer: Medicare Other | Admitting: Dietician

## 2012-07-14 ENCOUNTER — Encounter: Payer: Medicare Other | Attending: Family Medicine | Admitting: Dietician

## 2012-07-14 VITALS — Ht 66.0 in | Wt 190.9 lb

## 2012-07-14 DIAGNOSIS — Z713 Dietary counseling and surveillance: Secondary | ICD-10-CM | POA: Insufficient documentation

## 2012-07-14 DIAGNOSIS — E119 Type 2 diabetes mellitus without complications: Secondary | ICD-10-CM | POA: Insufficient documentation

## 2012-07-14 NOTE — Progress Notes (Signed)
  Medical Nutrition Therapy:  Appt start time: 1330 end time:  1400.  Assessment:  Primary concerns today: Gaining weight and having limited ability to exercise, concerned and is going to get a second opinion.  Has decreased her exercise/activity regimen with the reoccurrence of of a bulging area in the site where she had a hernia repair with mesh placement in December.  On seeing her surgeon, he assured her that it was not to be worried about and she could get back to regular activity and playing tennis.  The bulging occurs when arising from the prone position.  She is concerned to the point of limiting her exercise, she is using a sports binder when walking or doing more active house or yard work.  She has curtailed her exercise, but has recently with the improved weather gotten back in to walking.  She is about trying to get back to her old regimen.    BLOOD GLUCOSE LEVELS:Testing one to two times per day.  Fasting: 107, 114, 116, 95, 97, 98, 90, 104, 95   HS:  115, 110, 105, 122, 94, 110, 108, 147 (pie) 130 (pie)    MEDICATIONS: Med review completed.  Foot Self-exam:  Daily completes  Dilated Eye Exam:  Called the MD and was advised that it was very good last year and did not need another exam for one more year.  HYPERGLYCEMIA: No S/S reported.  HYPOGLYCEMIA:  No S/S reported.  DIETARY INTAKE:  24-hr recall:  Reports that her eating pattern has stayed the same.  Having some issues with close friends that are excellent cooks and are having a problem getting use to the concept that she is limiting her intake of the sweet, fatty, foods that are hight in carbs.  Continues to strive to eat more organic and cleaner foods.     Recent physical activity: Starting to get back to walking.  Having issues with the hernia site beginning to bulge again.  Just walking at this time.  Using a sport binder when she walks.  Estimated energy needs: 1400-1500 calories 160-165 g carbohydrates 105-110 g  protein 38-40 g fat  Progress Towards Goal(s):  In progress.   Nutritional Diagnosis:  [place]-2.1 Inpaired nutrition utilization As related to glucose.  As evidenced by diagnosis of type 2 diabetes, weight gain and difficulty losing weight without limiting carbs and increasing exercise and activity..    Intervention:  Nutrition Review of current eating and activity patterns.  Encouraged to continue to limit the concentrated sweets and less complex starches.  Agreed that a second opinion would help with determining the safety of her exercise and the level to work toward.  Continue to aim for weight loss that will facilitate decreased insulin resistance.  Monitoring/Evaluation:  Dietary intake, exercise, blood glucose , and body weight following MD appointment.Marland Kitchen

## 2012-07-15 ENCOUNTER — Ambulatory Visit: Payer: Medicare Other | Admitting: Dietician

## 2012-07-16 ENCOUNTER — Encounter: Payer: Self-pay | Admitting: Dietician

## 2012-08-01 ENCOUNTER — Ambulatory Visit: Payer: Medicare Other | Admitting: Hematology & Oncology

## 2012-08-01 ENCOUNTER — Other Ambulatory Visit: Payer: Medicare Other | Admitting: Lab

## 2012-08-08 ENCOUNTER — Other Ambulatory Visit: Payer: Medicare Other | Admitting: Lab

## 2012-08-08 ENCOUNTER — Ambulatory Visit: Payer: Medicare Other | Admitting: Hematology & Oncology

## 2012-08-08 ENCOUNTER — Telehealth: Payer: Self-pay | Admitting: Hematology & Oncology

## 2012-08-08 NOTE — Telephone Encounter (Signed)
Left pt message to call and reschedule today's missed appointment

## 2012-08-16 ENCOUNTER — Telehealth: Payer: Self-pay | Admitting: Hematology & Oncology

## 2012-08-16 NOTE — Telephone Encounter (Signed)
Pt called left message wanting to reschedule 4-28 no show to 5-13 early or 5-29 in the afternoon. I left her message on both phones that I don't have any openings on either day and to call me.

## 2012-08-17 ENCOUNTER — Telehealth: Payer: Self-pay | Admitting: Hematology & Oncology

## 2012-08-17 NOTE — Telephone Encounter (Signed)
Pt scheduled 6-16 appointment

## 2012-09-26 ENCOUNTER — Other Ambulatory Visit (HOSPITAL_BASED_OUTPATIENT_CLINIC_OR_DEPARTMENT_OTHER): Payer: Medicare Other | Admitting: Lab

## 2012-09-26 ENCOUNTER — Ambulatory Visit (HOSPITAL_BASED_OUTPATIENT_CLINIC_OR_DEPARTMENT_OTHER): Payer: Medicare Other | Admitting: Hematology & Oncology

## 2012-09-26 VITALS — BP 100/60 | HR 67 | Temp 98.1°F | Resp 16 | Ht 67.0 in | Wt 188.0 lb

## 2012-09-26 DIAGNOSIS — I81 Portal vein thrombosis: Secondary | ICD-10-CM

## 2012-09-26 DIAGNOSIS — Z86718 Personal history of other venous thrombosis and embolism: Secondary | ICD-10-CM

## 2012-09-26 LAB — CBC WITH DIFFERENTIAL (CANCER CENTER ONLY)
BASO%: 1 % (ref 0.0–2.0)
EOS%: 2.9 % (ref 0.0–7.0)
HCT: 39.3 % (ref 34.8–46.6)
HGB: 12.9 g/dL (ref 11.6–15.9)
LYMPH#: 1.3 10*3/uL (ref 0.9–3.3)
MONO#: 0.5 10*3/uL (ref 0.1–0.9)
NEUT#: 2.9 10*3/uL (ref 1.5–6.5)
NEUT%: 59.2 % (ref 39.6–80.0)
RDW: 13.8 % (ref 11.1–15.7)
WBC: 4.9 10*3/uL (ref 3.9–10.0)

## 2012-09-26 NOTE — Progress Notes (Signed)
CC:   Vicki Dennis, M.D.  DIAGNOSIS:  Portal vein thrombosis, idiopathic.  CURRENT THERAPY:  Aspirin 162 mg p.o. daily.  INTERIM HISTORY:  Vicki Dennis comes in for followup.  She is doing okay. Unfortunately, she still has a little bit of a hernia.  She apparently saw a doctor at [facility].  The hernia does not bother her.  She has been very active.  She is going to [place] in a couple of weeks.  I told Vicki Dennis that I really did not see a need for her to be operated on again.  Again, the hernia is small from what I can tell.  She is not being bothered by this.  She is eating well.  She is having no abdominal pain.  There are no changes in bowel or bladder habits.  Again, she is going to [place] in a couple of weeks.  She is going to [place] and [place].  I asked her if she could bring me back one stamp from each country.  She said that she will do this for me.  PHYSICAL EXAMINATION:  General:  This is a well-developed, well- nourished African American female in no obvious distress.  Vital signs: Show temperature of 98.1, pulse 67, respiratory rate 16, blood pressure 100/60.  Weight is 188.  Head and neck:  Shows a normocephalic, atraumatic skull.  There are no ocular or oral lesions.  There are no palpable cervical or supraclavicular lymph nodes.  Lungs:  Clear bilaterally.  Cardiac:  Regular rate and rhythm with a normal S1, S2. There are no murmurs, rubs or bruits.  Abdomen:  Soft with good bowel sounds.  There is no palpable abdominal mass.  She does have a little bit of a hernia above the umbilicus, this is more to the right of the umbilicus.  It is nontender.  She has no palpable hepatosplenomegaly. Extremities:  Show no clubbing, cyanosis or edema.  Neurological:  Shows no focal neurological deficits.  LABORATORY STUDIES:  White cell count is 4.9, hemoglobin 12.9, hematocrit 39.3, platelet count 207.  IMPRESSION:  Vicki Dennis is a very charming 67 year old African American female  with an idiopathic portal vein thrombosis.  We have done extensive testing on her for hypercoagulability.  So far all the tests have come back negative.  She is on aspirin.  She will continue on the aspirin.  I told her to make sure that she drinks a lot of fluid while she is going over to [place].  Of note, when we last saw her, her D-dimer was 0.62.  We will plan to get her back in about 2 months.  I want to make sure we see her after she gets back from [place] and make sure everything is going okay.    ______________________________ [name], M.D. PRE/MEDQ  D:  09/26/2012  T:  09/26/2012  Job:  [id number]

## 2012-09-26 NOTE — Progress Notes (Signed)
This office note has been dictated.

## 2012-11-22 ENCOUNTER — Telehealth: Payer: Self-pay | Admitting: Hematology & Oncology

## 2012-11-22 NOTE — Telephone Encounter (Signed)
Patient called and cx8/14/14 apt and resch for 01/02/13

## 2012-11-24 ENCOUNTER — Other Ambulatory Visit: Payer: Medicare Other | Admitting: Lab

## 2012-11-24 ENCOUNTER — Ambulatory Visit: Payer: Medicare Other | Admitting: Hematology & Oncology

## 2013-01-02 ENCOUNTER — Ambulatory Visit (HOSPITAL_BASED_OUTPATIENT_CLINIC_OR_DEPARTMENT_OTHER): Payer: Medicare (Managed Care) | Admitting: Hematology & Oncology

## 2013-01-02 ENCOUNTER — Other Ambulatory Visit (HOSPITAL_BASED_OUTPATIENT_CLINIC_OR_DEPARTMENT_OTHER): Payer: Medicare (Managed Care) | Admitting: Lab

## 2013-01-02 VITALS — BP 101/61 | HR 65 | Temp 97.9°F | Resp 16 | Ht 67.0 in | Wt 189.0 lb

## 2013-01-02 DIAGNOSIS — I81 Portal vein thrombosis: Secondary | ICD-10-CM

## 2013-01-02 LAB — CBC WITH DIFFERENTIAL (CANCER CENTER ONLY)
BASO#: 0.1 10*3/uL (ref 0.0–0.2)
Eosinophils Absolute: 0.2 10*3/uL (ref 0.0–0.5)
HGB: 13.6 g/dL (ref 11.6–15.9)
LYMPH#: 1.5 10*3/uL (ref 0.9–3.3)
MCH: 29.6 pg (ref 26.0–34.0)
MONO#: 0.7 10*3/uL (ref 0.1–0.9)
MONO%: 10.8 % (ref 0.0–13.0)
NEUT#: 3.6 10*3/uL (ref 1.5–6.5)
Platelets: 190 10*3/uL (ref 145–400)
RBC: 4.59 10*6/uL (ref 3.70–5.32)
WBC: 6 10*3/uL (ref 3.9–10.0)

## 2013-01-02 LAB — D-DIMER, QUANTITATIVE: D-Dimer, Quant: 0.82 ug/mL-FEU — ABNORMAL HIGH (ref 0.00–0.48)

## 2013-01-02 NOTE — Progress Notes (Signed)
This office note has been dictated.

## 2013-01-10 NOTE — Progress Notes (Signed)
CC:   Vicki Dennis, M.D.  DIAGNOSIS:  Idiopathic portal vein thrombosis.  CURRENT THERAPY:  Aspirin 162 mg p.o. daily.  INTERIM HISTORY:  Vicki Dennis comes in for followup.  She went to [place]. She had a great time in [place].  She went to [place] and [place].  Again, she had a good time over there.  She brought me back a couple of stamps. She had no problems with traveling.  There was no leg swelling.  There was no abdominal pain.  She does have a ventral wall abdominal hernia. She saw a physician in [place] for this.  He wanted to do surgery on her.  She does not want any surgery right now.  She has had no leg swelling.  There have been no rashes.  There has been no change in bowel or bladder habits.  She is taking the aspirin without any problems.  There has been no melena or bright red blood per rectum.  She states has had no cough.  PHYSICAL EXAMINATION:  General:  This is a well-developed, well- nourished, African American female in no obvious distress.  Vital Signs: Temperature of 97.9, pulse 65, respiratory rate 18, blood pressure 101/61.  Weight is 189.  Head and Neck:  Normocephalic, atraumatic skull.  There are no ocular or oral lesions.  There are no palpable cervical or supraclavicular lymph nodes.  Lungs:  Clear to percussion and auscultation bilaterally.  Cardiac:  Regular rate and rhythm with a normal S1 and S2.  There are no murmurs, rubs, or bruits.  Abdomen: Soft.  She has good bowel sounds.  There is no fluid wave.  She does have the abdominal ventral wall hernia.  This easily reduces. Extremities:  Show no clubbing, cyanosis, or edema.  She has good strength in her legs.  She has good range motion of her joints.  Back. No tenderness over the spine, ribs, or hips.  Skin:  No rashes, ecchymosis, or petechiae.  LABORATORY STUDIES:  White cell count is 6, hemoglobin 13.6, hematocrit 40.8, platelet count 190.  IMPRESSION:  Vicki Dennis is a very charming 67 year old  African female with a history of portal vein thrombus.  This is idiopathic.  We treated her with anticoagulation.  She was on anticoagulation for a year.  She now is on aspirin and doing well.  We will plan to get her back in 3 months' time now.  I do not see that we need any blood work in between visits.    ______________________________ [name], M.D. PRE/MEDQ  D:  01/02/2013  T:  01/10/2013  Job:  [id number]

## 2013-03-13 LAB — HM COLONOSCOPY

## 2013-03-20 NOTE — Progress Notes (Signed)
CC:   Vicki Dennis, M.D.  DIAGNOSIS:  Idiopathic portal vein thrombosis.  CURRENT THERAPY:  Aspirin 162 mg p.o. daily.  INTERIM HISTORY:  Vicki Dennis comes in for followup.  She went to [place]. She had a great time in [place].  She went to [place] and [place].  Again, she had a good time over there.  She brought me back a couple of stamps. She had no problems with traveling.  There is no leg swelling.  There is no abdominal pain.  She does have a ventral wall abdominal hernia.  She saw a physician in [place] for this.  He wanted to do surgery on her. She does not want any surgery right now.  She has had no leg swelling.  There have been no rashes.  There has been no change in bowel or bladder habits.  She has taken the aspirin without any problems.  There has been no melena or bright red blood per rectum.  She has had no cough.  PHYSICAL EXAMINATION:  General:  This is a well-developed, well- nourished African American female in no obvious distress.  Vital Signs: Temperature of 97.9, pulse 65, respiratory rate 18, blood pressure 101/61.  Weight is 189.  Head and Neck:  Normocephalic, atraumatic skull.  There are no ocular or oral lesions.  There are no palpable cervical or supraclavicular lymph nodes.  Lungs:  Clear to percussion and auscultation bilaterally.  Cardiac:  Regular rate and rhythm with a normal S1 and S2.  There are no murmurs, rubs, or bruits.  Abdomen: Soft.  She has good bowel sounds.  There is no fluid wave.  She does have the abdominal ventral wall hernia.  This easily reduces. Extremities:  Show no clubbing, cyanosis, or edema.  She has good strength in her legs.  She has good range motion of her joints.  Back: No tenderness over the spine, ribs, or hips.  Skin:  No rashes, ecchymosis, or petechia.  LABORATORY STUDIES:  White cell count is 6, hemoglobin 13.6, hematocrit 40.8, platelet count 190.  IMPRESSION:  Vicki Dennis is a very charming 67 year old African  American female with a history of portal vein thrombosis.  This is idiopathic. We treated her with anticoagulation.  She was on anticoagulation for a year.  She now is on aspirin and doing well.  We will plan to get her back in 3 months' time now.  I do not see that we need any blood work in between visits.    ______________________________ [name], M.D. PRE/MEDQ  D:  01/02/2013  T:  03/20/2013  Job:  [id number]

## 2013-04-17 ENCOUNTER — Other Ambulatory Visit: Payer: Medicare (Managed Care) | Admitting: Lab

## 2013-04-17 ENCOUNTER — Ambulatory Visit: Payer: Medicare (Managed Care) | Admitting: Hematology & Oncology

## 2013-04-17 ENCOUNTER — Telehealth: Payer: Self-pay | Admitting: Hematology & Oncology

## 2013-04-17 NOTE — Telephone Encounter (Signed)
Left message for pt to call and reschedule missed appointments

## 2013-04-18 ENCOUNTER — Telehealth: Payer: Self-pay | Admitting: Hematology & Oncology

## 2013-04-18 NOTE — Telephone Encounter (Signed)
Patient called and resch 04/17/13 missed appt for 05/08/13

## 2013-05-04 ENCOUNTER — Telehealth: Payer: Self-pay | Admitting: Hematology & Oncology

## 2013-05-04 NOTE — Telephone Encounter (Signed)
Pt moved 1-26 to 2-2

## 2013-05-08 ENCOUNTER — Other Ambulatory Visit: Payer: Medicare (Managed Care) | Admitting: Lab

## 2013-05-08 ENCOUNTER — Ambulatory Visit: Payer: Medicare (Managed Care) | Admitting: Hematology & Oncology

## 2013-05-15 ENCOUNTER — Encounter: Payer: Self-pay | Admitting: Hematology & Oncology

## 2013-05-15 ENCOUNTER — Other Ambulatory Visit (HOSPITAL_BASED_OUTPATIENT_CLINIC_OR_DEPARTMENT_OTHER): Payer: Medicare (Managed Care) | Admitting: Lab

## 2013-05-15 ENCOUNTER — Ambulatory Visit (HOSPITAL_BASED_OUTPATIENT_CLINIC_OR_DEPARTMENT_OTHER): Payer: Medicare (Managed Care) | Admitting: Hematology & Oncology

## 2013-05-15 VITALS — BP 120/81 | HR 90 | Temp 98.1°F | Resp 14 | Ht 68.0 in | Wt 189.0 lb

## 2013-05-15 DIAGNOSIS — I81 Portal vein thrombosis: Secondary | ICD-10-CM

## 2013-05-15 DIAGNOSIS — Z7982 Long term (current) use of aspirin: Secondary | ICD-10-CM

## 2013-05-15 LAB — CBC WITH DIFFERENTIAL (CANCER CENTER ONLY)
BASO#: 0 10*3/uL (ref 0.0–0.2)
BASO%: 0.6 % (ref 0.0–2.0)
EOS%: 1.8 % (ref 0.0–7.0)
Eosinophils Absolute: 0.1 10*3/uL (ref 0.0–0.5)
HEMATOCRIT: 41.5 % (ref 34.8–46.6)
HEMOGLOBIN: 13.5 g/dL (ref 11.6–15.9)
LYMPH#: 1.6 10*3/uL (ref 0.9–3.3)
LYMPH%: 25.3 % (ref 14.0–48.0)
MCH: 29.2 pg (ref 26.0–34.0)
MCHC: 32.5 g/dL (ref 32.0–36.0)
MCV: 90 fL (ref 81–101)
MONO#: 0.6 10*3/uL (ref 0.1–0.9)
MONO%: 9.7 % (ref 0.0–13.0)
NEUT#: 3.9 10*3/uL (ref 1.5–6.5)
NEUT%: 62.6 % (ref 39.6–80.0)
Platelets: 225 10*3/uL (ref 145–400)
RBC: 4.63 10*6/uL (ref 3.70–5.32)
RDW: 14.2 % (ref 11.1–15.7)
WBC: 6.2 10*3/uL (ref 3.9–10.0)

## 2013-05-15 LAB — D-DIMER, QUANTITATIVE: D-Dimer, Quant: 0.86 ug/mL-FEU — ABNORMAL HIGH (ref 0.00–0.48)

## 2013-05-15 NOTE — Progress Notes (Signed)
This office note has been dictated.

## 2013-05-16 NOTE — Progress Notes (Signed)
DIAGNOSIS:  Idiopathic portal vein thrombosis.  CURRENT THERAPY:  Aspirin 162 mg p.o. daily.  INTERIM HISTORY:  Vicki Dennis comes in for a followup.  We last saw her back in September.  She is doing quite well.  She has had no problems from my point of view.  She is on aspirin and doing well.  She has an abdominal wall hernia which she is just following.  She has had no nausea or vomiting.  There has been no change in bowel or bladder habits.  There has been no leg swelling.  She has had no rashes. There has been no cough or shortness of breath.  Her last D-dimer was 0.82.  PHYSICAL EXAMINATION:  General:  This is a well-developed, well- nourished [place] American female, in no obvious distress.  Vital Signs: Temperature of 98.1, pulse 90, respiratory rate 14, blood pressure 120/81.  Weight is 189 pounds.  Head and Neck:  Normocephalic, atraumatic skull.  There are no ocular or oral lesions.  There are no palpable cervical or supraclavicular lymph nodes.  Lungs:  Clear bilaterally.  Cardiac:  Regular rate and rhythm with a normal S1, S2. There are no murmurs, rubs, or bruits.  Abdomen:  Soft.  She has good bowel sounds.  There is no fluid wave.  There is no palpable hepatosplenomegaly.  Back:  No tenderness over the spine, ribs, or hips. Extremities:  Show no clubbing, cyanosis, or edema.  Neurological: Shows no focal neurological deficits.  LABORATORY STUDIES:  White cell count is 6.2, hemoglobin 13.5, hematocrit 41.5, platelet count 225.  IMPRESSION:  Vicki Dennis is a very nice 68 year old African American female.  She has idiopathic portal vein thrombosis.  We have done an extensive workup for this.  She was on Xarelto for 2 years.  We then got her on aspirin.  She is doing well on aspirin.  She is asymptomatic with aspirin.  I do not see that we have to do any additional tests on her right now.  We will plan to get her back in another 4 months  so.   ______________________________ Volanda Napoleon, M.D. PRE/MEDQ  D:  05/15/2013  T:  05/16/2013  Job:  [id number]

## 2013-07-05 ENCOUNTER — Ambulatory Visit: Payer: Medicare (Managed Care) | Admitting: *Deleted

## 2013-09-11 ENCOUNTER — Other Ambulatory Visit (HOSPITAL_BASED_OUTPATIENT_CLINIC_OR_DEPARTMENT_OTHER): Payer: Medicare Other | Admitting: Lab

## 2013-09-11 ENCOUNTER — Ambulatory Visit: Payer: Medicare (Managed Care) | Admitting: Hematology & Oncology

## 2013-09-12 ENCOUNTER — Telehealth: Payer: Self-pay | Admitting: Hematology & Oncology

## 2013-09-12 NOTE — Telephone Encounter (Signed)
Patient called and resch 09/11/13 missed apt for 09/18/13

## 2013-09-18 ENCOUNTER — Encounter: Payer: Self-pay | Admitting: Hematology & Oncology

## 2013-09-18 ENCOUNTER — Other Ambulatory Visit: Payer: Medicare Other | Admitting: Lab

## 2013-09-18 ENCOUNTER — Ambulatory Visit (HOSPITAL_BASED_OUTPATIENT_CLINIC_OR_DEPARTMENT_OTHER): Payer: Medicare Other | Admitting: Hematology & Oncology

## 2013-09-18 VITALS — BP 116/74 | HR 65 | Temp 98.2°F | Resp 14 | Ht 67.0 in | Wt 194.0 lb

## 2013-09-18 DIAGNOSIS — Z7982 Long term (current) use of aspirin: Secondary | ICD-10-CM

## 2013-09-18 DIAGNOSIS — I81 Portal vein thrombosis: Secondary | ICD-10-CM

## 2013-09-18 DIAGNOSIS — I82409 Acute embolism and thrombosis of unspecified deep veins of unspecified lower extremity: Secondary | ICD-10-CM

## 2013-09-18 LAB — D-DIMER, QUANTITATIVE (NOT AT ARMC): D DIMER QUANT: 0.81 ug{FEU}/mL — AB (ref 0.00–0.48)

## 2013-09-18 LAB — CBC WITH DIFFERENTIAL (CANCER CENTER ONLY)
BASO#: 0.1 10*3/uL (ref 0.0–0.2)
BASO%: 1.3 % (ref 0.0–2.0)
EOS ABS: 0.4 10*3/uL (ref 0.0–0.5)
EOS%: 6.1 % (ref 0.0–7.0)
HCT: 39.3 % (ref 34.8–46.6)
HGB: 13.1 g/dL (ref 11.6–15.9)
LYMPH#: 1.7 10*3/uL (ref 0.9–3.3)
LYMPH%: 27.2 % (ref 14.0–48.0)
MCH: 29.7 pg (ref 26.0–34.0)
MCHC: 33.3 g/dL (ref 32.0–36.0)
MCV: 89 fL (ref 81–101)
MONO#: 0.5 10*3/uL (ref 0.1–0.9)
MONO%: 8.9 % (ref 0.0–13.0)
NEUT#: 3.4 10*3/uL (ref 1.5–6.5)
NEUT%: 56.5 % (ref 39.6–80.0)
Platelets: 190 10*3/uL (ref 145–400)
RBC: 4.41 10*6/uL (ref 3.70–5.32)
RDW: 14.1 % (ref 11.1–15.7)
WBC: 6.1 10*3/uL (ref 3.9–10.0)

## 2013-09-18 NOTE — Progress Notes (Signed)
Hematology and Oncology Follow Up Visit  Vicki Dennis [id number] 05-28-1945 68 y.o. 09/18/2013   Principle Diagnosis:   Idiopathic portal vein thrombosis.  Current Therapy:    Aspirin 162 mg by mouth daily     Interim History:  [name] is back for followup. Last saw her back in February. Since then, she did do quite well. She's had really no problems. She does have a abdominal wall hernia. This is not been causing her any issues I told her not to have any surgery for this.  Does note abdominal pain. She's had no change in bowel or bladder habits.  There's been no issues with cough or shortness of breath. She's had no leg swelling. She's had no rashes. She's had no nausea or vomiting.  Medications: Current outpatient prescriptions:ACCU-CHEK AVIVA PLUS test strip, by Other route 2 (two) times daily. , Disp: , Rfl: ;  aspirin 81 MG tablet, Take 81 mg by mouth daily. , Disp: , Rfl: ;  budesonide-formoterol (SYMBICORT) 80-4.5 MCG/ACT inhaler, Inhale 1 puff into the lungs daily. Uses with seasonal allergies in the Spring and the Fall., Disp: , Rfl: ;  cetirizine (ZYRTEC) 10 MG tablet, Take 10 mg by mouth daily with breakfast. , Disp: , Rfl:  montelukast (SINGULAIR) 10 MG tablet, Take 10 mg by mouth at bedtime. , Disp: , Rfl: ;  Multiple Vitamins-Minerals (MULTIVITAMIN WITH MINERALS) tablet, Take 1 tablet by mouth daily.  , Disp: , Rfl: ;  Omega-3 Fatty Acids (FISH OIL CONCENTRATE) 1000 MG CAPS, Take by mouth 3 (three) times daily., Disp: , Rfl: ;  PROVENTIL HFA 108 (90 BASE) MCG/ACT inhaler, , Disp: , Rfl:  telmisartan-hydrochlorothiazide (MICARDIS HCT) 80-25 MG per tablet, , Disp: , Rfl:   Allergies:  Allergies  Allergen Reactions  . Codeine Other (See Comments)    "makes me crazy"  . Sulfa Antibiotics Anaphylaxis    "died 3 times".  . Oxycodone     Past Medical History, Surgical history, Social history, and Family History were reviewed and updated.  Review of Systems: As  above  Physical Exam:  height is 5\' 7"  (1.702 m) and weight is 194 lb (87.998 kg). Her oral temperature is 98.2 F (36.8 C). Her blood pressure is 116/74 and her pulse is 65. Her respiration is 14.   Well-developed and well-nourished white female. Head and neck exam shows no ocular or oral lesions. She is no palpable cervical or supraclavicular lymph nodes. Lungs are clear bilaterally. Cardiac exam shows a regular rate and rhythm with no murmurs rubs or bruits. Abdomen is soft. Has good bowel sounds. She does have a small anterior abdominal wall hernia. This is down by the umbilicus. It is not tender. It is reducible. There is no palpable abdominal mass. There is no fluid wave. There is a palpable liver or spleen. Extremities shows no clubbing cyanosis or edema. Neurological exam shows no focal neurological deficits. Skin exam no rashes ecchymoses or petechia.  Lab Results  Component Value Date   WBC 6.1 09/18/2013   HGB 13.1 09/18/2013   HCT 39.3 09/18/2013   MCV 89 09/18/2013   PLT 190 09/18/2013     Chemistry      Component Value Date/Time   NA 142 05/02/2012 1148   K 3.9 05/02/2012 1148   CL 104 05/02/2012 1148   CO2 27 05/02/2012 1148   BUN 21 05/02/2012 1148   CREATININE 0.82 05/02/2012 1148      Component Value Date/Time   CALCIUM  9.6 05/02/2012 1148   ALKPHOS 65 05/02/2012 1148   AST 14 05/02/2012 1148   ALT 14 05/02/2012 1148   BILITOT 0.4 05/02/2012 1148         Impression and Plan: Vicki Dennis is a 68 year old African American female with an idiopathic portal vein thrombus. She was on anticoagulation for a year. She's been on aspirin now. She's doing well with the aspirin.  I don't think that we have to do any studies on her right now. No see anything that would suggest recurrence of the thrombus.  We will plan to get her back to see [place] in another 4 months.   [name], MD 6/8/20151:47 PM

## 2013-12-04 ENCOUNTER — Ambulatory Visit (HOSPITAL_BASED_OUTPATIENT_CLINIC_OR_DEPARTMENT_OTHER): Payer: Medicare Other | Admitting: Hematology & Oncology

## 2013-12-04 ENCOUNTER — Encounter: Payer: Self-pay | Admitting: Hematology & Oncology

## 2013-12-04 ENCOUNTER — Other Ambulatory Visit (HOSPITAL_BASED_OUTPATIENT_CLINIC_OR_DEPARTMENT_OTHER): Payer: Medicare Other | Admitting: Lab

## 2013-12-04 VITALS — BP 103/75 | HR 75 | Temp 97.7°F | Resp 14 | Ht 67.0 in | Wt 190.0 lb

## 2013-12-04 DIAGNOSIS — I150 Renovascular hypertension: Secondary | ICD-10-CM

## 2013-12-04 DIAGNOSIS — I81 Portal vein thrombosis: Secondary | ICD-10-CM

## 2013-12-04 DIAGNOSIS — I82409 Acute embolism and thrombosis of unspecified deep veins of unspecified lower extremity: Secondary | ICD-10-CM

## 2013-12-04 LAB — CBC WITH DIFFERENTIAL (CANCER CENTER ONLY)
BASO#: 0.1 10*3/uL (ref 0.0–0.2)
BASO%: 1.4 % (ref 0.0–2.0)
EOS ABS: 0.2 10*3/uL (ref 0.0–0.5)
EOS%: 4.1 % (ref 0.0–7.0)
HCT: 40.4 % (ref 34.8–46.6)
HEMOGLOBIN: 13.5 g/dL (ref 11.6–15.9)
LYMPH#: 1.6 10*3/uL (ref 0.9–3.3)
LYMPH%: 27.2 % (ref 14.0–48.0)
MCH: 29.9 pg (ref 26.0–34.0)
MCHC: 33.4 g/dL (ref 32.0–36.0)
MCV: 89 fL (ref 81–101)
MONO#: 0.6 10*3/uL (ref 0.1–0.9)
MONO%: 9.7 % (ref 0.0–13.0)
NEUT%: 57.6 % (ref 39.6–80.0)
NEUTROS ABS: 3.4 10*3/uL (ref 1.5–6.5)
Platelets: 248 10*3/uL (ref 145–400)
RBC: 4.52 10*6/uL (ref 3.70–5.32)
RDW: 14 % (ref 11.1–15.7)
WBC: 5.9 10*3/uL (ref 3.9–10.0)

## 2013-12-04 NOTE — Progress Notes (Signed)
.  Hematology and Oncology Follow Up Visit  Vicki Dennis [id number] 06-17-1945 68 y.o. 12/04/2013   Principle Diagnosis:   Idiopathic portal vein thrombosis  Current Therapy:    Aspirin 162 mg by mouth daily     Interim History:  [name] is back for followup. We last saw her back in June. Since then, she's been doing pretty well. She's had no cough. His been no bleeding. She's had no abdominal pain. The been no change in bowel or bladder habits. She's had no leg swelling. Skin no rashes. Had no fevers sweats or chills.  She's had no weight loss that she has not wanted to lose. She's had that she's lost 4 pounds as per last saw her. She's been up in [place] with her family. She really had a nice time for some of the summer up there.  She's had no problems with her appetite. Again she is watching what she is.  Medications: Current outpatient prescriptions:ACCU-CHEK AVIVA PLUS test strip, by Other route 2 (two) times daily. , Disp: , Rfl: ;  aspirin 81 MG tablet, Take 81 mg by mouth daily. , Disp: , Rfl: ;  budesonide-formoterol (SYMBICORT) 80-4.5 MCG/ACT inhaler, Inhale 1 puff into the lungs daily. Uses with seasonal allergies in the Spring and the Fall., Disp: , Rfl: ;  cetirizine (ZYRTEC) 10 MG tablet, Take 10 mg by mouth daily with breakfast. , Disp: , Rfl:  montelukast (SINGULAIR) 10 MG tablet, Take 10 mg by mouth at bedtime. , Disp: , Rfl: ;  Multiple Vitamins-Minerals (MULTIVITAMIN WITH MINERALS) tablet, Take 1 tablet by mouth daily.  , Disp: , Rfl: ;  Omega-3 Fatty Acids (FISH OIL CONCENTRATE) 1000 MG CAPS, Take by mouth 3 (three) times daily., Disp: , Rfl: ;  PROVENTIL HFA 108 (90 BASE) MCG/ACT inhaler, , Disp: , Rfl:  telmisartan-hydrochlorothiazide (MICARDIS HCT) 80-25 MG per tablet, , Disp: , Rfl:   Allergies:  Allergies  Allergen Reactions  . Codeine Other (See Comments)    "makes me crazy"  . Sulfa Antibiotics Anaphylaxis    "died 3 times".  . Oxycodone      Past Medical History, Surgical history, Social history, and Family History were reviewed and updated.  Review of Systems: As above  Physical Exam:  height is 5\' 7"  (1.702 m) and weight is 190 lb (86.183 kg). Her oral temperature is 97.7 F (36.5 C). Her blood pressure is 103/75 and her pulse is 75. Her respiration is 14.   Well-developed and well-nourished white female. Head and neck exam shows no ocular or oral lesions. She is no palpable cervical or supraclavicular lymph nodes. Lungs are clear bilaterally. Cardiac exam shows a regular rate and rhythm with no murmurs rubs or bruits. Abdomen is soft. Has good bowel sounds. She does have a small anterior abdominal wall hernia. This is down by the umbilicus. It is not tender. It is reducible. There is no palpable abdominal mass. There is no fluid wave. There is a palpable liver or spleen. Extremities shows no clubbing cyanosis or edema. Neurological exam shows no focal neurological deficits. Skin exam no rashes ecchymoses or petechia.  Lab Results  Component Value Date   WBC 5.9 12/04/2013   HGB 13.5 12/04/2013   HCT 40.4 12/04/2013   MCV 89 12/04/2013   PLT 248 12/04/2013     Chemistry      Component Value Date/Time   NA 142 05/02/2012 1148   K 3.9 05/02/2012 1148   CL 104  05/02/2012 1148   CO2 27 05/02/2012 1148   BUN 21 05/02/2012 1148   CREATININE 0.82 05/02/2012 1148      Component Value Date/Time   CALCIUM 9.6 05/02/2012 1148   ALKPHOS 65 05/02/2012 1148   AST 14 05/02/2012 1148   ALT 14 05/02/2012 1148   BILITOT 0.4 05/02/2012 1148         Impression and Plan: Vicki Dennis is 68 year old female with an idiopathic portal vein thrombus. She was treated with anticoagulation with Coumadin. She did well. She was on blood thinner for one year.  We have her on aspirin. She's doing well with the aspirin.  I'll plan to get her back in about 3 months time.   [name], MD 8/24/20152:47 PM

## 2013-12-05 LAB — D-DIMER, QUANTITATIVE: D-Dimer, Quant: 0.69 ug/mL-FEU — ABNORMAL HIGH (ref 0.00–0.48)

## 2013-12-11 ENCOUNTER — Other Ambulatory Visit: Payer: Medicare (Managed Care) | Admitting: Lab

## 2013-12-11 ENCOUNTER — Ambulatory Visit: Payer: Medicare (Managed Care) | Admitting: Hematology & Oncology

## 2014-02-05 ENCOUNTER — Encounter: Payer: Self-pay | Admitting: Hematology & Oncology

## 2014-02-05 ENCOUNTER — Ambulatory Visit (HOSPITAL_BASED_OUTPATIENT_CLINIC_OR_DEPARTMENT_OTHER): Payer: Medicare Other | Admitting: Hematology & Oncology

## 2014-02-05 ENCOUNTER — Other Ambulatory Visit (HOSPITAL_BASED_OUTPATIENT_CLINIC_OR_DEPARTMENT_OTHER): Payer: Medicare Other | Admitting: Lab

## 2014-02-05 VITALS — BP 144/67 | HR 63 | Temp 97.6°F | Resp 16 | Ht 67.0 in | Wt 194.0 lb

## 2014-02-05 DIAGNOSIS — Z7982 Long term (current) use of aspirin: Secondary | ICD-10-CM

## 2014-02-05 DIAGNOSIS — I81 Portal vein thrombosis: Secondary | ICD-10-CM

## 2014-02-05 DIAGNOSIS — I150 Renovascular hypertension: Secondary | ICD-10-CM

## 2014-02-05 LAB — CBC WITH DIFFERENTIAL (CANCER CENTER ONLY)
BASO#: 0.1 10*3/uL (ref 0.0–0.2)
BASO%: 0.8 % (ref 0.0–2.0)
EOS ABS: 0.2 10*3/uL (ref 0.0–0.5)
EOS%: 3.4 % (ref 0.0–7.0)
HEMATOCRIT: 39.5 % (ref 34.8–46.6)
HGB: 13.1 g/dL (ref 11.6–15.9)
LYMPH#: 2 10*3/uL (ref 0.9–3.3)
LYMPH%: 32.2 % (ref 14.0–48.0)
MCH: 29.4 pg (ref 26.0–34.0)
MCHC: 33.2 g/dL (ref 32.0–36.0)
MCV: 89 fL (ref 81–101)
MONO#: 0.5 10*3/uL (ref 0.1–0.9)
MONO%: 7.8 % (ref 0.0–13.0)
NEUT#: 3.4 10*3/uL (ref 1.5–6.5)
NEUT%: 55.8 % (ref 39.6–80.0)
Platelets: 213 10*3/uL (ref 145–400)
RBC: 4.45 10*6/uL (ref 3.70–5.32)
RDW: 14 % (ref 11.1–15.7)
WBC: 6.2 10*3/uL (ref 3.9–10.0)

## 2014-02-05 NOTE — Progress Notes (Signed)
.  Hematology and Oncology Follow Up Visit  Vicki Dennis [id number] 11/07/1945 68 y.o. 02/05/2014   Principle Diagnosis:   Idiopathic portal vein thrombosis  Current Therapy:    Aspirin 162 mg by mouth daily     Interim History:  [name] is back for followup. We last saw her back in August. Since then, she's been doing pretty well. She's had no cough. His been no bleeding. She's had no abdominal pain. The been no change in bowel or bladder habits. She's had no leg swelling. Skin no rashes. Had no fevers sweats or chills.  She is working part-time. She enjoys this.   She's had no problems with her appetite. Again she is watching what she eats.  Medications: Current outpatient prescriptions:ACCU-CHEK AVIVA PLUS test strip, by Other route 2 (two) times daily. , Disp: , Rfl: ;  aspirin 81 MG tablet, Take 81 mg by mouth daily. , Disp: , Rfl: ;  budesonide-formoterol (SYMBICORT) 80-4.5 MCG/ACT inhaler, Inhale 1 puff into the lungs daily. Uses with seasonal allergies in the Spring and the Fall., Disp: , Rfl: ;  cetirizine (ZYRTEC) 10 MG tablet, Take 10 mg by mouth daily with breakfast. , Disp: , Rfl:  montelukast (SINGULAIR) 10 MG tablet, Take 10 mg by mouth at bedtime. , Disp: , Rfl: ;  Multiple Vitamins-Minerals (MULTIVITAMIN WITH MINERALS) tablet, Take 1 tablet by mouth daily.  , Disp: , Rfl: ;  Omega-3 Fatty Acids (FISH OIL CONCENTRATE) 1000 MG CAPS, Take by mouth 3 (three) times daily., Disp: , Rfl: ;  PROVENTIL HFA 108 (90 BASE) MCG/ACT inhaler, , Disp: , Rfl:  telmisartan-hydrochlorothiazide (MICARDIS HCT) 80-25 MG per tablet, , Disp: , Rfl:   Allergies:  Allergies  Allergen Reactions  . Codeine Other (See Comments)    "makes me crazy"  . Sulfa Antibiotics Anaphylaxis    "died 3 times".  . Oxycodone     Past Medical History, Surgical history, Social history, and Family History were reviewed and updated.  Review of Systems: As above  Physical Exam:  height is 5\' 7"   (1.702 m) and weight is 194 lb (87.998 kg). Her oral temperature is 97.6 F (36.4 C). Her blood pressure is 144/67 and her pulse is 63. Her respiration is 16.   Well-developed and well-nourished African American female. Head and neck exam shows no ocular or oral lesions. She has no palpable cervical or supraclavicular lymph nodes. Lungs are clear bilaterally. Cardiac exam shows a regular rate and rhythm with no murmurs rubs or bruits. Abdomen is soft. Has good bowel sounds. She does have a small anterior abdominal wall hernia. This is down by the umbilicus. It is not tender. It is reducible. There is no palpable abdominal mass. There is no fluid wave. There is no palpable liver or spleen tip. Extremities shows no clubbing cyanosis or edema. She has good range of motion of her joints. She has good strength in her extremities Neurological exam shows no focal neurological deficits. Skin exam no rashes ecchymoses or petechia.  Lab Results  Component Value Date   WBC 6.2 02/05/2014   HGB 13.1 02/05/2014   HCT 39.5 02/05/2014   MCV 89 02/05/2014   PLT 213 02/05/2014     Chemistry      Component Value Date/Time   NA 142 05/02/2012 1148   K 3.9 05/02/2012 1148   CL 104 05/02/2012 1148   CO2 27 05/02/2012 1148   BUN 21 05/02/2012 1148   CREATININE 0.82 05/02/2012 1148  Component Value Date/Time   CALCIUM 9.6 05/02/2012 1148   ALKPHOS 65 05/02/2012 1148   AST 14 05/02/2012 1148   ALT 14 05/02/2012 1148   BILITOT 0.4 05/02/2012 1148         Impression and Plan: Vicki Dennis is 68 year old female with an idiopathic portal vein thrombus. She was treated with anticoagulation with Coumadin. She did well. She was on blood thinner for one year.  We have her on aspirin. She's doing well with the aspirin. I see no problems with continuing her on aspirin.  I'll plan to get her back in about 4 months time.   [name], MD 10/26/20155:34 PM

## 2014-02-06 ENCOUNTER — Ambulatory Visit (INDEPENDENT_AMBULATORY_CARE_PROVIDER_SITE_OTHER): Payer: Medicare Other | Admitting: Family Medicine

## 2014-02-06 ENCOUNTER — Encounter: Payer: Self-pay | Admitting: Family Medicine

## 2014-02-06 VITALS — BP 107/84 | HR 82 | Temp 98.6°F | Ht 67.0 in | Wt 195.4 lb

## 2014-02-06 DIAGNOSIS — B019 Varicella without complication: Secondary | ICD-10-CM

## 2014-02-06 DIAGNOSIS — E1165 Type 2 diabetes mellitus with hyperglycemia: Secondary | ICD-10-CM

## 2014-02-06 DIAGNOSIS — Z0000 Encounter for general adult medical examination without abnormal findings: Secondary | ICD-10-CM

## 2014-02-06 DIAGNOSIS — I10 Essential (primary) hypertension: Secondary | ICD-10-CM | POA: Diagnosis not present

## 2014-02-06 DIAGNOSIS — Z86010 Personal history of colonic polyps: Secondary | ICD-10-CM

## 2014-02-06 DIAGNOSIS — E669 Obesity, unspecified: Secondary | ICD-10-CM

## 2014-02-06 DIAGNOSIS — Z8619 Personal history of other infectious and parasitic diseases: Secondary | ICD-10-CM | POA: Insufficient documentation

## 2014-02-06 DIAGNOSIS — B269 Mumps without complication: Secondary | ICD-10-CM | POA: Insufficient documentation

## 2014-02-06 DIAGNOSIS — E785 Hyperlipidemia, unspecified: Secondary | ICD-10-CM

## 2014-02-06 DIAGNOSIS — K219 Gastro-esophageal reflux disease without esophagitis: Secondary | ICD-10-CM

## 2014-02-06 DIAGNOSIS — L821 Other seborrheic keratosis: Secondary | ICD-10-CM

## 2014-02-06 DIAGNOSIS — B059 Measles without complication: Secondary | ICD-10-CM | POA: Insufficient documentation

## 2014-02-06 DIAGNOSIS — IMO0002 Reserved for concepts with insufficient information to code with codable children: Secondary | ICD-10-CM

## 2014-02-06 DIAGNOSIS — E119 Type 2 diabetes mellitus without complications: Secondary | ICD-10-CM | POA: Diagnosis not present

## 2014-02-06 DIAGNOSIS — J4520 Mild intermittent asthma, uncomplicated: Secondary | ICD-10-CM

## 2014-02-06 DIAGNOSIS — T7840XA Allergy, unspecified, initial encounter: Secondary | ICD-10-CM

## 2014-02-06 LAB — COMPREHENSIVE METABOLIC PANEL
ALT: 11 U/L (ref 0–35)
AST: 13 U/L (ref 0–37)
Albumin: 4 g/dL (ref 3.5–5.2)
Alkaline Phosphatase: 57 U/L (ref 39–117)
BUN: 16 mg/dL (ref 6–23)
CALCIUM: 9.4 mg/dL (ref 8.4–10.5)
CHLORIDE: 107 meq/L (ref 96–112)
CO2: 26 mEq/L (ref 19–32)
Creatinine, Ser: 0.73 mg/dL (ref 0.50–1.10)
Glucose, Bld: 115 mg/dL — ABNORMAL HIGH (ref 70–99)
POTASSIUM: 5.2 meq/L (ref 3.5–5.3)
Sodium: 140 mEq/L (ref 135–145)
Total Bilirubin: 0.3 mg/dL (ref 0.2–1.2)
Total Protein: 6.2 g/dL (ref 6.0–8.3)

## 2014-02-06 LAB — D-DIMER, QUANTITATIVE (NOT AT ARMC): D DIMER QUANT: 0.95 ug{FEU}/mL — AB (ref 0.00–0.48)

## 2014-02-06 MED ORDER — LANSOPRAZOLE 15 MG PO CPDR: 15.0000 mg | DELAYED_RELEASE_CAPSULE | Freq: Every day | ORAL | Status: DC

## 2014-02-06 MED ORDER — FLUTICASONE PROPIONATE 50 MCG/ACT NA SUSP: 2.0000 | Freq: Every day | NASAL | Status: DC

## 2014-02-06 NOTE — Assessment & Plan Note (Signed)
Well controlled, no changes to meds. Encouraged heart healthy diet such as the DASH diet and exercise as tolerated.  °

## 2014-02-06 NOTE — Progress Notes (Signed)
Pre visit review using our clinic review tool, if applicable. No additional management support is needed unless otherwise documented below in the visit note. 

## 2014-02-06 NOTE — Patient Instructions (Signed)
Preventive Care for Adults A healthy lifestyle and preventive care can promote health and wellness. Preventive health guidelines for women include the following key practices.  A routine yearly physical is a good way to check with your health care provider about your health and preventive screening. It is a chance to share any concerns and updates on your health and to receive a thorough exam.  Visit your dentist for a routine exam and preventive care every 6 months. Brush your teeth twice a day and floss once a day. Good oral hygiene prevents tooth decay and gum disease.  The frequency of eye exams is based on your age, health, family medical history, use of contact lenses, and other factors. Follow your health care provider's recommendations for frequency of eye exams.  Eat a healthy diet. Foods like vegetables, fruits, whole grains, low-fat dairy products, and lean protein foods contain the nutrients you need without too many calories. Decrease your intake of foods high in solid fats, added sugars, and salt. Eat the right amount of calories for you.Get information about a proper diet from your health care provider, if necessary.  Regular physical exercise is one of the most important things you can do for your health. Most adults should get at least 150 minutes of moderate-intensity exercise (any activity that increases your heart rate and causes you to sweat) each week. In addition, most adults need muscle-strengthening exercises on 2 or more days a week.  Maintain a healthy weight. The body mass index (BMI) is a screening tool to identify possible weight problems. It provides an estimate of body fat based on height and weight. Your health care provider can find your BMI and can help you achieve or maintain a healthy weight.For adults 20 years and older:  A BMI below 18.5 is considered underweight.  A BMI of 18.5 to 24.9 is normal.  A BMI of 25 to 29.9 is considered overweight.  A BMI of  30 and above is considered obese.  Maintain normal blood lipids and cholesterol levels by exercising and minimizing your intake of saturated fat. Eat a balanced diet with plenty of fruit and vegetables. Blood tests for lipids and cholesterol should begin at age 76 and be repeated every 5 years. If your lipid or cholesterol levels are high, you are over 50, or you are at high risk for heart disease, you may need your cholesterol levels checked more frequently.Ongoing high lipid and cholesterol levels should be treated with medicines if diet and exercise are not working.  If you smoke, find out from your health care provider how to quit. If you do not use tobacco, do not start.  Lung cancer screening is recommended for adults aged 22-80 years who are at high risk for developing lung cancer because of a history of smoking. A yearly low-dose CT scan of the lungs is recommended for people who have at least a 30-pack-year history of smoking and are a current smoker or have quit within the past 15 years. A pack year of smoking is smoking an average of 1 pack of cigarettes a day for 1 year (for example: 1 pack a day for 30 years or 2 packs a day for 15 years). Yearly screening should continue until the smoker has stopped smoking for at least 15 years. Yearly screening should be stopped for people who develop a health problem that would prevent them from having lung cancer treatment.  If you are pregnant, do not drink alcohol. If you are breastfeeding,  be very cautious about drinking alcohol. If you are not pregnant and choose to drink alcohol, do not have more than 1 drink per day. One drink is considered to be 12 ounces (355 mL) of beer, 5 ounces (148 mL) of wine, or 1.5 ounces (44 mL) of liquor.  Avoid use of street drugs. Do not share needles with anyone. Ask for help if you need support or instructions about stopping the use of drugs.  High blood pressure causes heart disease and increases the risk of  stroke. Your blood pressure should be checked at least every 1 to 2 years. Ongoing high blood pressure should be treated with medicines if weight loss and exercise do not work.  If you are 75-52 years old, ask your health care provider if you should take aspirin to prevent strokes.  Diabetes screening involves taking a blood sample to check your fasting blood sugar level. This should be done once every 3 years, after age 15, if you are within normal weight and without risk factors for diabetes. Testing should be considered at a younger age or be carried out more frequently if you are overweight and have at least 1 risk factor for diabetes.  Breast cancer screening is essential preventive care for women. You should practice "breast self-awareness." This means understanding the normal appearance and feel of your breasts and may include breast self-examination. Any changes detected, no matter how small, should be reported to a health care provider. Women in their 58s and 30s should have a clinical breast exam (CBE) by a health care provider as part of a regular health exam every 1 to 3 years. After age 16, women should have a CBE every year. Starting at age 53, women should consider having a mammogram (breast X-ray test) every year. Women who have a family history of breast cancer should talk to their health care provider about genetic screening. Women at a high risk of breast cancer should talk to their health care providers about having an MRI and a mammogram every year.  Breast cancer gene (BRCA)-related cancer risk assessment is recommended for women who have family members with BRCA-related cancers. BRCA-related cancers include breast, ovarian, tubal, and peritoneal cancers. Having family members with these cancers may be associated with an increased risk for harmful changes (mutations) in the breast cancer genes BRCA1 and BRCA2. Results of the assessment will determine the need for genetic counseling and  BRCA1 and BRCA2 testing.  Routine pelvic exams to screen for cancer are no longer recommended for nonpregnant women who are considered low risk for cancer of the pelvic organs (ovaries, uterus, and vagina) and who do not have symptoms. Ask your health care provider if a screening pelvic exam is right for you.  If you have had past treatment for cervical cancer or a condition that could lead to cancer, you need Pap tests and screening for cancer for at least 20 years after your treatment. If Pap tests have been discontinued, your risk factors (such as having a new sexual partner) need to be reassessed to determine if screening should be resumed. Some women have medical problems that increase the chance of getting cervical cancer. In these cases, your health care provider may recommend more frequent screening and Pap tests.  The HPV test is an additional test that may be used for cervical cancer screening. The HPV test looks for the virus that can cause the cell changes on the cervix. The cells collected during the Pap test can be  tested for HPV. The HPV test could be used to screen women aged 30 years and older, and should be used in women of any age who have unclear Pap test results. After the age of 30, women should have HPV testing at the same frequency as a Pap test.  Colorectal cancer can be detected and often prevented. Most routine colorectal cancer screening begins at the age of 50 years and continues through age 75 years. However, your health care provider may recommend screening at an earlier age if you have risk factors for colon cancer. On a yearly basis, your health care provider may provide home test kits to check for hidden blood in the stool. Use of a small camera at the end of a tube, to directly examine the colon (sigmoidoscopy or colonoscopy), can detect the earliest forms of colorectal cancer. Talk to your health care provider about this at age 50, when routine screening begins. Direct  exam of the colon should be repeated every 5-10 years through age 75 years, unless early forms of pre-cancerous polyps or small growths are found.  People who are at an increased risk for hepatitis B should be screened for this virus. You are considered at high risk for hepatitis B if:  You were born in a country where hepatitis B occurs often. Talk with your health care provider about which countries are considered high risk.  Your parents were born in a high-risk country and you have not received a shot to protect against hepatitis B (hepatitis B vaccine).  You have HIV or AIDS.  You use needles to inject street drugs.  You live with, or have sex with, someone who has hepatitis B.  You get hemodialysis treatment.  You take certain medicines for conditions like cancer, organ transplantation, and autoimmune conditions.  Hepatitis C blood testing is recommended for all people born from 1945 through 1965 and any individual with known risks for hepatitis C.  Practice safe sex. Use condoms and avoid high-risk sexual practices to reduce the spread of sexually transmitted infections (STIs). STIs include gonorrhea, chlamydia, syphilis, trichomonas, herpes, HPV, and human immunodeficiency virus (HIV). Herpes, HIV, and HPV are viral illnesses that have no cure. They can result in disability, cancer, and death.  You should be screened for sexually transmitted illnesses (STIs) including gonorrhea and chlamydia if:  You are sexually active and are younger than 24 years.  You are older than 24 years and your health care provider tells you that you are at risk for this type of infection.  Your sexual activity has changed since you were last screened and you are at an increased risk for chlamydia or gonorrhea. Ask your health care provider if you are at risk.  If you are at risk of being infected with HIV, it is recommended that you take a prescription medicine daily to prevent HIV infection. This is  called preexposure prophylaxis (PrEP). You are considered at risk if:  You are a heterosexual woman, are sexually active, and are at increased risk for HIV infection.  You take drugs by injection.  You are sexually active with a partner who has HIV.  Talk with your health care provider about whether you are at high risk of being infected with HIV. If you choose to begin PrEP, you should first be tested for HIV. You should then be tested every 3 months for as long as you are taking PrEP.  Osteoporosis is a disease in which the bones lose minerals and strength   with aging. This can result in serious bone fractures or breaks. The risk of osteoporosis can be identified using a bone density scan. Women ages 65 years and over and women at risk for fractures or osteoporosis should discuss screening with their health care providers. Ask your health care provider whether you should take a calcium supplement or vitamin D to reduce the rate of osteoporosis.  Menopause can be associated with physical symptoms and risks. Hormone replacement therapy is available to decrease symptoms and risks. You should talk to your health care provider about whether hormone replacement therapy is right for you.  Use sunscreen. Apply sunscreen liberally and repeatedly throughout the day. You should seek shade when your shadow is shorter than you. Protect yourself by wearing long sleeves, pants, a wide-brimmed hat, and sunglasses year round, whenever you are outdoors.  Once a month, do a whole body skin exam, using a mirror to look at the skin on your back. Tell your health care provider of new moles, moles that have irregular borders, moles that are larger than a pencil eraser, or moles that have changed in shape or color.  Stay current with required vaccines (immunizations).  Influenza vaccine. All adults should be immunized every year.  Tetanus, diphtheria, and acellular pertussis (Td, Tdap) vaccine. Pregnant women should  receive 1 dose of Tdap vaccine during each pregnancy. The dose should be obtained regardless of the length of time since the last dose. Immunization is preferred during the 27th-36th week of gestation. An adult who has not previously received Tdap or who does not know her vaccine status should receive 1 dose of Tdap. This initial dose should be followed by tetanus and diphtheria toxoids (Td) booster doses every 10 years. Adults with an unknown or incomplete history of completing a 3-dose immunization series with Td-containing vaccines should begin or complete a primary immunization series including a Tdap dose. Adults should receive a Td booster every 10 years.  Varicella vaccine. An adult without evidence of immunity to varicella should receive 2 doses or a second dose if she has previously received 1 dose. Pregnant females who do not have evidence of immunity should receive the first dose after pregnancy. This first dose should be obtained before leaving the health care facility. The second dose should be obtained 4-8 weeks after the first dose.  Human papillomavirus (HPV) vaccine. Females aged 13-26 years who have not received the vaccine previously should obtain the 3-dose series. The vaccine is not recommended for use in pregnant females. However, pregnancy testing is not needed before receiving a dose. If a female is found to be pregnant after receiving a dose, no treatment is needed. In that case, the remaining doses should be delayed until after the pregnancy. Immunization is recommended for any person with an immunocompromised condition through the age of 26 years if she did not get any or all doses earlier. During the 3-dose series, the second dose should be obtained 4-8 weeks after the first dose. The third dose should be obtained 24 weeks after the first dose and 16 weeks after the second dose.  Zoster vaccine. One dose is recommended for adults aged 60 years or older unless certain conditions are  present.  Measles, mumps, and rubella (MMR) vaccine. Adults born before 1957 generally are considered immune to measles and mumps. Adults born in 1957 or later should have 1 or more doses of MMR vaccine unless there is a contraindication to the vaccine or there is laboratory evidence of immunity to   each of the three diseases. A routine second dose of MMR vaccine should be obtained at least 28 days after the first dose for students attending postsecondary schools, health care workers, or international travelers. People who received inactivated measles vaccine or an unknown type of measles vaccine during 1963-1967 should receive 2 doses of MMR vaccine. People who received inactivated mumps vaccine or an unknown type of mumps vaccine before 1979 and are at high risk for mumps infection should consider immunization with 2 doses of MMR vaccine. For females of childbearing age, rubella immunity should be determined. If there is no evidence of immunity, females who are not pregnant should be vaccinated. If there is no evidence of immunity, females who are pregnant should delay immunization until after pregnancy. Unvaccinated health care workers born before 1957 who lack laboratory evidence of measles, mumps, or rubella immunity or laboratory confirmation of disease should consider measles and mumps immunization with 2 doses of MMR vaccine or rubella immunization with 1 dose of MMR vaccine.  Pneumococcal 13-valent conjugate (PCV13) vaccine. When indicated, a person who is uncertain of her immunization history and has no record of immunization should receive the PCV13 vaccine. An adult aged 19 years or older who has certain medical conditions and has not been previously immunized should receive 1 dose of PCV13 vaccine. This PCV13 should be followed with a dose of pneumococcal polysaccharide (PPSV23) vaccine. The PPSV23 vaccine dose should be obtained at least 8 weeks after the dose of PCV13 vaccine. An adult aged 19  years or older who has certain medical conditions and previously received 1 or more doses of PPSV23 vaccine should receive 1 dose of PCV13. The PCV13 vaccine dose should be obtained 1 or more years after the last PPSV23 vaccine dose.  Pneumococcal polysaccharide (PPSV23) vaccine. When PCV13 is also indicated, PCV13 should be obtained first. All adults aged 65 years and older should be immunized. An adult younger than age 65 years who has certain medical conditions should be immunized. Any person who resides in a nursing home or long-term care facility should be immunized. An adult smoker should be immunized. People with an immunocompromised condition and certain other conditions should receive both PCV13 and PPSV23 vaccines. People with human immunodeficiency virus (HIV) infection should be immunized as soon as possible after diagnosis. Immunization during chemotherapy or radiation therapy should be avoided. Routine use of PPSV23 vaccine is not recommended for American Indians, [place] Natives, or people younger than 65 years unless there are medical conditions that require PPSV23 vaccine. When indicated, people who have unknown immunization and have no record of immunization should receive PPSV23 vaccine. One-time revaccination 5 years after the first dose of PPSV23 is recommended for people aged 19-64 years who have chronic kidney failure, nephrotic syndrome, asplenia, or immunocompromised conditions. People who received 1-2 doses of PPSV23 before age 65 years should receive another dose of PPSV23 vaccine at age 65 years or later if at least 5 years have passed since the previous dose. Doses of PPSV23 are not needed for people immunized with PPSV23 at or after age 65 years.  Meningococcal vaccine. Adults with asplenia or persistent complement component deficiencies should receive 2 doses of quadrivalent meningococcal conjugate (MenACWY-D) vaccine. The doses should be obtained at least 2 months apart.  Microbiologists working with certain meningococcal bacteria, military recruits, people at risk during an outbreak, and people who travel to or live in countries with a high rate of meningitis should be immunized. A first-year college student up through age   21 years who is living in a residence hall should receive a dose if she did not receive a dose on or after her 16th birthday. Adults who have certain high-risk conditions should receive one or more doses of vaccine.  Hepatitis A vaccine. Adults who wish to be protected from this disease, have certain high-risk conditions, work with hepatitis A-infected animals, work in hepatitis A research labs, or travel to or work in countries with a high rate of hepatitis A should be immunized. Adults who were previously unvaccinated and who anticipate close contact with an international adoptee during the first 60 days after arrival in the [place] from a country with a high rate of hepatitis A should be immunized.  Hepatitis B vaccine. Adults who wish to be protected from this disease, have certain high-risk conditions, may be exposed to blood or other infectious body fluids, are household contacts or sex partners of hepatitis B positive people, are clients or workers in certain care facilities, or travel to or work in countries with a high rate of hepatitis B should be immunized.  Haemophilus influenzae type b (Hib) vaccine. A previously unvaccinated person with asplenia or sickle cell disease or having a scheduled splenectomy should receive 1 dose of Hib vaccine. Regardless of previous immunization, a recipient of a hematopoietic stem cell transplant should receive a 3-dose series 6-12 months after her successful transplant. Hib vaccine is not recommended for adults with HIV infection. Preventive Services / Frequency Ages 64 to 68 years  Blood pressure check.** / Every 1 to 2 years.  Lipid and cholesterol check.** / Every 5 years beginning at age  22.  Clinical breast exam.** / Every 3 years for women in their 88s and 53s.  BRCA-related cancer risk assessment.** / For women who have family members with a BRCA-related cancer (breast, ovarian, tubal, or peritoneal cancers).  Pap test.** / Every 2 years from ages 90 through 51. Every 3 years starting at age 21 through age 56 or 3 with a history of 3 consecutive normal Pap tests.  HPV screening.** / Every 3 years from ages 24 through ages 1 to 46 with a history of 3 consecutive normal Pap tests.  Hepatitis C blood test.** / For any individual with known risks for hepatitis C.  Skin self-exam. / Monthly.  Influenza vaccine. / Every year.  Tetanus, diphtheria, and acellular pertussis (Tdap, Td) vaccine.** / Consult your health care provider. Pregnant women should receive 1 dose of Tdap vaccine during each pregnancy. 1 dose of Td every 10 years.  Varicella vaccine.** / Consult your health care provider. Pregnant females who do not have evidence of immunity should receive the first dose after pregnancy.  HPV vaccine. / 3 doses over 6 months, if 72 and younger. The vaccine is not recommended for use in pregnant females. However, pregnancy testing is not needed before receiving a dose.  Measles, mumps, rubella (MMR) vaccine.** / You need at least 1 dose of MMR if you were born in 1957 or later. You may also need a 2nd dose. For females of childbearing age, rubella immunity should be determined. If there is no evidence of immunity, females who are not pregnant should be vaccinated. If there is no evidence of immunity, females who are pregnant should delay immunization until after pregnancy.  Pneumococcal 13-valent conjugate (PCV13) vaccine.** / Consult your health care provider.  Pneumococcal polysaccharide (PPSV23) vaccine.** / 1 to 2 doses if you smoke cigarettes or if you have certain conditions.  Meningococcal vaccine.** /  1 dose if you are age 19 to 21 years and a first-year college  student living in a residence hall, or have one of several medical conditions, you need to get vaccinated against meningococcal disease. You may also need additional booster doses.  Hepatitis A vaccine.** / Consult your health care provider.  Hepatitis B vaccine.** / Consult your health care provider.  Haemophilus influenzae type b (Hib) vaccine.** / Consult your health care provider. Ages 40 to 64 years  Blood pressure check.** / Every 1 to 2 years.  Lipid and cholesterol check.** / Every 5 years beginning at age 20 years.  Lung cancer screening. / Every year if you are aged 55-80 years and have a 30-pack-year history of smoking and currently smoke or have quit within the past 15 years. Yearly screening is stopped once you have quit smoking for at least 15 years or develop a health problem that would prevent you from having lung cancer treatment.  Clinical breast exam.** / Every year after age 40 years.  BRCA-related cancer risk assessment.** / For women who have family members with a BRCA-related cancer (breast, ovarian, tubal, or peritoneal cancers).  Mammogram.** / Every year beginning at age 40 years and continuing for as long as you are in good health. Consult with your health care provider.  Pap test.** / Every 3 years starting at age 30 years through age 65 or 70 years with a history of 3 consecutive normal Pap tests.  HPV screening.** / Every 3 years from ages 30 years through ages 65 to 70 years with a history of 3 consecutive normal Pap tests.  Fecal occult blood test (FOBT) of stool. / Every year beginning at age 50 years and continuing until age 75 years. You may not need to do this test if you get a colonoscopy every 10 years.  Flexible sigmoidoscopy or colonoscopy.** / Every 5 years for a flexible sigmoidoscopy or every 10 years for a colonoscopy beginning at age 50 years and continuing until age 75 years.  Hepatitis C blood test.** / For all people born from 1945 through  1965 and any individual with known risks for hepatitis C.  Skin self-exam. / Monthly.  Influenza vaccine. / Every year.  Tetanus, diphtheria, and acellular pertussis (Tdap/Td) vaccine.** / Consult your health care provider. Pregnant women should receive 1 dose of Tdap vaccine during each pregnancy. 1 dose of Td every 10 years.  Varicella vaccine.** / Consult your health care provider. Pregnant females who do not have evidence of immunity should receive the first dose after pregnancy.  Zoster vaccine.** / 1 dose for adults aged 60 years or older.  Measles, mumps, rubella (MMR) vaccine.** / You need at least 1 dose of MMR if you were born in 1957 or later. You may also need a 2nd dose. For females of childbearing age, rubella immunity should be determined. If there is no evidence of immunity, females who are not pregnant should be vaccinated. If there is no evidence of immunity, females who are pregnant should delay immunization until after pregnancy.  Pneumococcal 13-valent conjugate (PCV13) vaccine.** / Consult your health care provider.  Pneumococcal polysaccharide (PPSV23) vaccine.** / 1 to 2 doses if you smoke cigarettes or if you have certain conditions.  Meningococcal vaccine.** / Consult your health care provider.  Hepatitis A vaccine.** / Consult your health care provider.  Hepatitis B vaccine.** / Consult your health care provider.  Haemophilus influenzae type b (Hib) vaccine.** / Consult your health care provider. Ages 65   years and over  Blood pressure check.** / Every 1 to 2 years.  Lipid and cholesterol check.** / Every 5 years beginning at age 22 years.  Lung cancer screening. / Every year if you are aged 73-80 years and have a 30-pack-year history of smoking and currently smoke or have quit within the past 15 years. Yearly screening is stopped once you have quit smoking for at least 15 years or develop a health problem that would prevent you from having lung cancer  treatment.  Clinical breast exam.** / Every year after age 4 years.  BRCA-related cancer risk assessment.** / For women who have family members with a BRCA-related cancer (breast, ovarian, tubal, or peritoneal cancers).  Mammogram.** / Every year beginning at age 40 years and continuing for as long as you are in good health. Consult with your health care provider.  Pap test.** / Every 3 years starting at age 9 years through age 34 or 91 years with 3 consecutive normal Pap tests. Testing can be stopped between 65 and 70 years with 3 consecutive normal Pap tests and no abnormal Pap or HPV tests in the past 10 years.  HPV screening.** / Every 3 years from ages 57 years through ages 64 or 45 years with a history of 3 consecutive normal Pap tests. Testing can be stopped between 65 and 70 years with 3 consecutive normal Pap tests and no abnormal Pap or HPV tests in the past 10 years.  Fecal occult blood test (FOBT) of stool. / Every year beginning at age 15 years and continuing until age 17 years. You may not need to do this test if you get a colonoscopy every 10 years.  Flexible sigmoidoscopy or colonoscopy.** / Every 5 years for a flexible sigmoidoscopy or every 10 years for a colonoscopy beginning at age 86 years and continuing until age 71 years.  Hepatitis C blood test.** / For all people born from [birth year] through 1967 and any individual with known risks for hepatitis C.  Osteoporosis screening.** / A one-time screening for women ages 83 years and over and women at risk for fractures or osteoporosis.  Skin self-exam. / Monthly.  Influenza vaccine. / Every year.  Tetanus, diphtheria, and acellular pertussis (Tdap/Td) vaccine.** / 1 dose of Td every 10 years.  Varicella vaccine.** / Consult your health care provider.  Zoster vaccine.** / 1 dose for adults aged 61 years or older.  Pneumococcal 13-valent conjugate (PCV13) vaccine.** / Consult your health care provider.  Pneumococcal  polysaccharide (PPSV23) vaccine.** / 1 dose for all adults aged 28 years and older.  Meningococcal vaccine.** / Consult your health care provider.  Hepatitis A vaccine.** / Consult your health care provider.  Hepatitis B vaccine.** / Consult your health care provider.  Haemophilus influenzae type b (Hib) vaccine.** / Consult your health care provider. ** Family history and personal history of risk and conditions may change your health care provider's recommendations. Document Released: 05/26/2001 Document Revised: 08/14/2013 Document Reviewed: 08/25/2010 [facility] Patient Information 2015 [place], [place]. This information is not intended to replace advice given to you by your health care provider. Make sure you discuss any questions you have with your health care provider.

## 2014-02-07 ENCOUNTER — Other Ambulatory Visit: Payer: Self-pay

## 2014-02-07 DIAGNOSIS — Z1231 Encounter for screening mammogram for malignant neoplasm of breast: Secondary | ICD-10-CM

## 2014-02-07 LAB — HEPATIC FUNCTION PANEL
ALT: 15 U/L (ref 0–35)
AST: 18 U/L (ref 0–37)
Albumin: 3.4 g/dL — ABNORMAL LOW (ref 3.5–5.2)
Alkaline Phosphatase: 57 U/L (ref 39–117)
Bilirubin, Direct: 0 mg/dL (ref 0.0–0.3)
TOTAL PROTEIN: 6.7 g/dL (ref 6.0–8.3)
Total Bilirubin: 0.5 mg/dL (ref 0.2–1.2)

## 2014-02-07 LAB — RENAL FUNCTION PANEL
Albumin: 3.4 g/dL — ABNORMAL LOW (ref 3.5–5.2)
BUN: 17 mg/dL (ref 6–23)
CO2: 23 meq/L (ref 19–32)
CREATININE: 0.7 mg/dL (ref 0.4–1.2)
Calcium: 9.4 mg/dL (ref 8.4–10.5)
Chloride: 107 mEq/L (ref 96–112)
GFR: 99.58 mL/min (ref >60.00)
GLUCOSE: 77 mg/dL (ref 70–99)
Phosphorus: 3.5 mg/dL (ref 2.3–4.6)
Potassium: 4.1 mEq/L (ref 3.5–5.1)
SODIUM: 140 meq/L (ref 135–145)

## 2014-02-07 LAB — LDL CHOLESTEROL, DIRECT: Direct LDL: 111.2 mg/dL

## 2014-02-07 LAB — LIPID PANEL
CHOL/HDL RATIO: 4
Cholesterol: 201 mg/dL — ABNORMAL HIGH (ref 0–200)
HDL: 54 mg/dL (ref >39.00)
NonHDL: 147
Triglycerides: 210 mg/dL — ABNORMAL HIGH (ref 0.0–149.0)
VLDL: 42 mg/dL — ABNORMAL HIGH (ref 0.0–40.0)

## 2014-02-07 LAB — HEMOGLOBIN A1C: HEMOGLOBIN A1C: 6.5 % (ref 4.6–6.5)

## 2014-02-07 LAB — MICROALBUMIN / CREATININE URINE RATIO
Creatinine,U: 160.2 mg/dL
Microalb Creat Ratio: 1.4 mg/g (ref 0.0–30.0)
Microalb, Ur: 2.2 mg/dL — ABNORMAL HIGH (ref 0.0–1.9)

## 2014-02-07 LAB — TSH: TSH: 0.36 u[IU]/mL (ref 0.35–4.50)

## 2014-02-08 ENCOUNTER — Encounter: Payer: Self-pay | Admitting: Family Medicine

## 2014-02-08 MED ORDER — ENALAPRIL MALEATE 2.5 MG PO TABS: 2.5000 mg | ORAL_TABLET | Freq: Every day | ORAL | Status: DC

## 2014-02-09 ENCOUNTER — Ambulatory Visit
Admission: RE | Admit: 2014-02-09 | Discharge: 2014-02-09 | Disposition: A | Discharge status: Home / Self Care | Payer: Medicare Other | Source: Ambulatory Visit

## 2014-02-09 DIAGNOSIS — Z1231 Encounter for screening mammogram for malignant neoplasm of breast: Secondary | ICD-10-CM

## 2014-02-10 ENCOUNTER — Encounter: Payer: Self-pay | Admitting: Family Medicine

## 2014-02-10 DIAGNOSIS — E663 Overweight: Secondary | ICD-10-CM | POA: Insufficient documentation

## 2014-02-10 DIAGNOSIS — E669 Obesity, unspecified: Secondary | ICD-10-CM

## 2014-02-10 DIAGNOSIS — L821 Other seborrheic keratosis: Secondary | ICD-10-CM

## 2014-02-10 DIAGNOSIS — J4520 Mild intermittent asthma, uncomplicated: Secondary | ICD-10-CM | POA: Insufficient documentation

## 2014-02-10 DIAGNOSIS — K219 Gastro-esophageal reflux disease without esophagitis: Secondary | ICD-10-CM

## 2014-02-10 DIAGNOSIS — Z86010 Personal history of colonic polyps: Secondary | ICD-10-CM | POA: Insufficient documentation

## 2014-02-10 HISTORY — DX: Other seborrheic keratosis: L82.1

## 2014-02-10 HISTORY — DX: Mild intermittent asthma, uncomplicated: J45.20

## 2014-02-10 HISTORY — DX: Obesity, unspecified: E66.9

## 2014-02-10 HISTORY — DX: Gastro-esophageal reflux disease without esophagitis: K21.9

## 2014-02-10 NOTE — Assessment & Plan Note (Signed)
Encouraged DASH diet, decrease po intake and increase exercise as tolerated. Needs 7-8 hours of sleep nightly. Avoid trans fats, eat small, frequent meals every 4-5 hours with lean proteins, complex carbs and healthy fats. Minimize simple carbs, GMO foods. 

## 2014-02-10 NOTE — Progress Notes (Signed)
Patient ID: Vicki Dennis, female   DOB: 1946/02/14, 68 y.o.   MRN: [id number] ANETT VEERKAMP [id number] 10/15/1945 02/10/2014      Progress Note-Follow Up  Subjective  Chief Complaint  Chief Complaint  Patient presents with  . Establish Care    new patient    HPI  Patient is a 68 year old female in today for routine medical care. She is here to day to establish care. She is doing well at this time but does have a history of portal vein thrombosis, asthma, allergies, diabetes and HTN. No recnet illness. Denies CP/palp/SOB/HA/congestion/fevers/GI or GU c/o. Taking meds as prescribed  Past Medical History  Diagnosis Date  . Hyperlipidemia   . Hypertension   . Portal vein thrombosis 03/09/2011    coumadin ended summer of 2013  . Asthma   . Blood transfusion without reported diagnosis   . GERD (gastroesophageal reflux disease)     rare use of OTC aid  . Anemia     h/o  . Pneumonia     as a child  . Diabetes mellitus     type-II, per Dr. Parke Simmers, no meds. , pt. reports weight loss has contributed  to control of diabetes.  She reports last HgbA1c- wnl.  . Diabetes mellitus 11/05/2010  . Chicken pox as a child  . Measles as a child  . Mumps as a child  . Diabetes mellitus type 2, uncontrolled 11/05/2010  . Allergy   . Diabetes mellitus type 2 in obese 11/05/2010  . Obesity 02/10/2014  . Esophageal reflux 02/10/2014  . Seborrheic keratosis 02/10/2014    Follows with dermatology    Past Surgical History  Procedure Laterality Date  . Knee arthroscopy w/ meniscal repair      both knees  . Tonsillectomy    . Umbilical hernia repair  03/16/2012    Procedure: HERNIA REPAIR UMBILICAL ADULT;  Surgeon: Shelly Rubenstein, MD;  Location: [facility] OR;  Service: General;  Laterality: N/A;  . Insertion of mesh  03/16/2012    Procedure: INSERTION OF MESH;  Surgeon: Shelly Rubenstein, MD;  Location: [facility] OR;  Service: General;  Laterality: N/A;  . Breast surgery      benign - cysts    Family  History  Problem Relation Age of Onset  . Cancer Father     prostate  . Cancer Maternal Aunt   . Aneurysm Mother 26    brain    History   Social History  . Marital Status: Divorced    Spouse Name: N/A    Number of Children: N/A  . Years of Education: N/A   Occupational History  . Not on file.   Social History Main Topics  . Smoking status: Never Smoker   . Smokeless tobacco: Never Used     Comment: never used tobacco  . Alcohol Use: Yes     Comment: for holidays  . Drug Use: No  . Sexual Activity: No     Comment: lives alone    Other Topics Concern  . Not on file   Social History Narrative  . No narrative on file    Current Outpatient Prescriptions on File Prior to Visit  Medication Sig Dispense Refill  . ACCU-CHEK AVIVA PLUS test strip by Other route 2 (two) times daily.       Marland Kitchen aspirin 81 MG tablet Take 81 mg by mouth daily.       . budesonide-formoterol (SYMBICORT) 80-4.5 MCG/ACT inhaler Inhale 1 puff  into the lungs daily. Uses with seasonal allergies in the Spring and the Fall.      . cetirizine (ZYRTEC) 10 MG tablet Take 10 mg by mouth daily with breakfast.       . montelukast (SINGULAIR) 10 MG tablet Take 10 mg by mouth at bedtime.       . Multiple Vitamins-Minerals (MULTIVITAMIN WITH MINERALS) tablet Take 1 tablet by mouth daily.        . Omega-3 Fatty Acids (FISH OIL CONCENTRATE) 1000 MG CAPS Take by mouth 3 (three) times daily.      Marland Kitchen PROVENTIL HFA 108 (90 BASE) MCG/ACT inhaler        No current facility-administered medications on file prior to visit.    Allergies  Allergen Reactions  . Codeine Other (See Comments)    "makes me crazy"  . Sulfa Antibiotics Anaphylaxis    "died 3 times".  . Oxycodone     Review of Systems  Review of Systems  Constitutional: Negative for fever, chills and malaise/fatigue.  HENT: Positive for congestion. Negative for hearing loss and nosebleeds.   Eyes: Negative for discharge.  Respiratory: Negative for cough,  sputum production, shortness of breath and wheezing.   Cardiovascular: Negative for chest pain, palpitations and leg swelling.  Gastrointestinal: Negative for heartburn, nausea, vomiting, abdominal pain, diarrhea, constipation and blood in stool.  Genitourinary: Negative for dysuria, urgency, frequency and hematuria.  Musculoskeletal: Negative for back pain, falls and myalgias.  Skin: Negative for rash.  Neurological: Negative for dizziness, tremors, sensory change, focal weakness, loss of consciousness, weakness and headaches.  Endo/Heme/Allergies: Negative for polydipsia. Does not bruise/bleed easily.  Psychiatric/Behavioral: Negative for depression and suicidal ideas. The patient is not nervous/anxious and does not have insomnia.     Objective  BP 107/84  Pulse 82  Temp(Src) 98.6 F (37 C) (Oral)  Ht 5\' 7"  (1.702 m)  Wt 195 lb 6.4 oz (88.633 kg)  BMI 30.60 kg/m2  SpO2 100%  Physical Exam  Physical Exam  Constitutional: She is oriented to person, place, and time and well-developed, well-nourished, and in no distress. No distress.  HENT:  Head: Normocephalic and atraumatic.  Right Ear: External ear normal.  Left Ear: External ear normal.  Nose: Nose normal.  Mouth/Throat: Oropharynx is clear and moist. No oropharyngeal exudate.  Eyes: Conjunctivae are normal. Pupils are equal, round, and reactive to light. Right eye exhibits no discharge. Left eye exhibits no discharge. No scleral icterus.  Neck: Normal range of motion. Neck supple. No thyromegaly present.  Cardiovascular: Normal rate, regular rhythm, normal heart sounds and intact distal pulses.   No murmur heard. Pulmonary/Chest: Effort normal and breath sounds normal. No respiratory distress. She has no wheezes. She has no rales.  Abdominal: Soft. Bowel sounds are normal. She exhibits no distension and no mass. There is no tenderness.  Musculoskeletal: Normal range of motion. She exhibits no edema and no tenderness.   Lymphadenopathy:    She has no cervical adenopathy.  Neurological: She is alert and oriented to person, place, and time. She has normal reflexes. No cranial nerve deficit. Coordination normal.  Skin: Skin is warm and dry. No rash noted. She is not diaphoretic.  Psychiatric: Mood, memory and affect normal.    Lab Results  Component Value Date   TSH 0.36 02/06/2014   Lab Results  Component Value Date   WBC 6.2 02/05/2014   HGB 13.1 02/05/2014   HCT 39.5 02/05/2014   MCV 89 02/05/2014   PLT 213 02/05/2014  Lab Results  Component Value Date   CREATININE 0.7 02/06/2014   BUN 17 02/06/2014   NA 140 02/06/2014   K 4.1 02/06/2014   CL 107 02/06/2014   CO2 23 02/06/2014   Lab Results  Component Value Date   ALT 15 02/06/2014   AST 18 02/06/2014   ALKPHOS 57 02/06/2014   BILITOT 0.5 02/06/2014   Lab Results  Component Value Date   CHOL 201* 02/06/2014   Lab Results  Component Value Date   HDL 54.00 02/06/2014   No results found for this basename: [facility]   Lab Results  Component Value Date   TRIG 210.0* 02/06/2014   Lab Results  Component Value Date   CHOLHDL 4 02/06/2014     Assessment & Plan  HTN (hypertension) Well controlled, no changes to meds. Encouraged heart healthy diet such as the DASH diet and exercise as tolerated.   Obesity Encouraged DASH diet, decrease po intake and increase exercise as tolerated. Needs 7-8 hours of sleep nightly. Avoid trans fats, eat small, frequent meals every 4-5 hours with lean proteins, complex carbs and healthy fats. Minimize simple carbs, GMO foods.  Hyperlipidemia Encouraged heart healthy diet, increase exercise, avoid trans fats, consider a krill oil cap daily  Allergy Tolerable on Zyrtec Flonase and singulair, continue same  Esophageal reflux Avoid offending foods, start probiotics. Do not eat large meals in late evening and consider raising head of bed.

## 2014-02-10 NOTE — Assessment & Plan Note (Signed)
Avoid offending foods, start probiotics. Do not eat large meals in late evening and consider raising head of bed.  

## 2014-02-10 NOTE — Assessment & Plan Note (Signed)
Encouraged heart healthy diet, increase exercise, avoid trans fats, consider a krill oil cap daily 

## 2014-02-10 NOTE — Assessment & Plan Note (Signed)
Tolerable on Zyrtec Flonase and singulair, continue same

## 2014-02-21 ENCOUNTER — Telehealth: Payer: Self-pay | Admitting: Family Medicine

## 2014-02-21 NOTE — Telephone Encounter (Signed)
Pt is wanting to know her lab results states they were done 2 wks ago and never got her results.

## 2014-02-22 NOTE — Telephone Encounter (Signed)
Inform pt that she was notified on:  Notes Recorded by Varney Daily, RMA on 02/08/2014 at 4:36 PM Patient informed and rx sent   I was on the phone with her for over 15 minutes explaining this

## 2014-03-27 ENCOUNTER — Telehealth: Payer: Self-pay | Admitting: Family Medicine

## 2014-03-27 NOTE — Telephone Encounter (Signed)
Caller name:Cohoon, Katharine Look Relation to UY:EBXI Call back [facility]:  Reason for call: pt states dr. Charlett Blake had provided her with paperwork to fill out several months ago. Pt states it was regarding who she would want has her power of attorney. Pt states she has misplaced the papers and would like to know if she can get her some more. Pt states she can come up here and pick them up .

## 2014-03-29 NOTE — Telephone Encounter (Signed)
Called and [facility] @ 4:57 @ [phone]) informing the pt that the paperwork that she requested will be left up front for her to pick up.//AB/CMA

## 2014-03-29 NOTE — Telephone Encounter (Signed)
Please advise.//AB/CMA 

## 2014-03-29 NOTE — Telephone Encounter (Signed)
Please set a copy of the advanced directives paperwork at front desk for patient and let her know it is here.

## 2014-06-07 ENCOUNTER — Telehealth: Payer: Self-pay | Admitting: Family Medicine

## 2014-06-07 NOTE — Telephone Encounter (Signed)
Please advise 

## 2014-06-07 NOTE — Telephone Encounter (Signed)
Can change the sig on her test strips and lancets check blood sugars daily and as needed  Disp #100 and 5 rf

## 2014-06-07 NOTE — Telephone Encounter (Signed)
Caller name: Rebakah Relation to pt: self Call back number: [phone] or [phone] [facility]acy[facility] in [place]  Reason for call:   Patient states that she uses her test strips twice a day and she is running out on her 90 day supply. Patient wants to know if she can increase her strips dosage so she won't run out.

## 2014-06-08 ENCOUNTER — Telehealth: Payer: Self-pay | Admitting: Family Medicine

## 2014-06-08 ENCOUNTER — Ambulatory Visit (HOSPITAL_BASED_OUTPATIENT_CLINIC_OR_DEPARTMENT_OTHER): Payer: 59 | Admitting: Hematology & Oncology

## 2014-06-08 ENCOUNTER — Other Ambulatory Visit (HOSPITAL_BASED_OUTPATIENT_CLINIC_OR_DEPARTMENT_OTHER): Payer: 59 | Admitting: Lab

## 2014-06-08 ENCOUNTER — Encounter: Payer: Self-pay | Admitting: Hematology & Oncology

## 2014-06-08 VITALS — BP 133/77 | HR 63 | Temp 97.7°F | Resp 16 | Ht 68.0 in | Wt 196.0 lb

## 2014-06-08 DIAGNOSIS — E669 Obesity, unspecified: Secondary | ICD-10-CM

## 2014-06-08 DIAGNOSIS — E1169 Type 2 diabetes mellitus with other specified complication: Secondary | ICD-10-CM

## 2014-06-08 DIAGNOSIS — I81 Portal vein thrombosis: Secondary | ICD-10-CM

## 2014-06-08 LAB — CBC WITH DIFFERENTIAL (CANCER CENTER ONLY)
BASO#: 0.1 10*3/uL (ref 0.0–0.2)
BASO%: 0.7 % (ref 0.0–2.0)
EOS ABS: 0.2 10*3/uL (ref 0.0–0.5)
EOS%: 2.7 % (ref 0.0–7.0)
HCT: 41.3 % (ref 34.8–46.6)
HEMOGLOBIN: 13.5 g/dL (ref 11.6–15.9)
LYMPH#: 2 10*3/uL (ref 0.9–3.3)
LYMPH%: 29 % (ref 14.0–48.0)
MCH: 28.7 pg (ref 26.0–34.0)
MCHC: 32.7 g/dL (ref 32.0–36.0)
MCV: 88 fL (ref 81–101)
MONO#: 0.6 10*3/uL (ref 0.1–0.9)
MONO%: 8.1 % (ref 0.0–13.0)
NEUT%: 59.5 % (ref 39.6–80.0)
NEUTROS ABS: 4.2 10*3/uL (ref 1.5–6.5)
Platelets: 202 10*3/uL (ref 145–400)
RBC: 4.7 10*6/uL (ref 3.70–5.32)
RDW: 14.9 % (ref 11.1–15.7)
WBC: 7 10*3/uL (ref 3.9–10.0)

## 2014-06-08 LAB — CMP (CANCER CENTER ONLY)
ALT: 14 U/L (ref 10–47)
AST: 16 U/L (ref 11–38)
Albumin: 3.4 g/dL (ref 3.3–5.5)
Alkaline Phosphatase: 57 U/L (ref 26–84)
BILIRUBIN TOTAL: 0.7 mg/dL (ref 0.20–1.60)
BUN, Bld: 17 mg/dL (ref 7–22)
CALCIUM: 9.3 mg/dL (ref 8.0–10.3)
CO2: 26 mEq/L (ref 18–33)
Chloride: 104 mEq/L (ref 98–108)
Creat: 0.7 mg/dl (ref 0.6–1.2)
Glucose, Bld: 115 mg/dL (ref 73–118)
Potassium: 4.2 mEq/L (ref 3.3–4.7)
SODIUM: 142 meq/L (ref 128–145)
TOTAL PROTEIN: 6.7 g/dL (ref 6.4–8.1)

## 2014-06-08 MED ORDER — GLUCOSE BLOOD VI STRP: ORAL_STRIP | Status: DC

## 2014-06-08 MED ORDER — LANCETS 30G MISC: Status: AC

## 2014-06-08 MED ORDER — LANCETS 30G MISC: Status: DC

## 2014-06-08 NOTE — Telephone Encounter (Signed)
Strips sent in and pt. Aware. Left a detailed message

## 2014-06-08 NOTE — Telephone Encounter (Signed)
Caller name: Nithila Relation to pt: SELF Call back number: [phone] [facility]: [facility]  Reason for call: PT CAME IN OFFICE REQUESTING NEEDING DIABETIC STRIPS, PT STATES HAS APPT FOR NEXT WEEK BUT DOES NOT HAVE ENOUGH SUPPLY FOR ACCU-CHEK AVIVA PLUS

## 2014-06-08 NOTE — Telephone Encounter (Signed)
Refill done to [facility] and called left msg. With the patient take care of refill requested.

## 2014-06-08 NOTE — Progress Notes (Signed)
.  Hematology and Oncology Follow Up Visit  Vicki Dennis [id number] 1945-07-04 69 y.o. 06/08/2014   Principle Diagnosis:   Idiopathic portal vein thrombosis  Current Therapy:    Aspirin 162 mg by mouth daily     Interim History:  [name] is back for followup. We last saw her back in October. Since then, she's been doing pretty well. She's had no cough. His been no bleeding. She's had no abdominal pain.   She had her birthday 2 weeks ago. She really enjoyed this.  She will be going to [place] this year. She goes to help some of the local villages in [place]. She really enjoys this. She probably will go in the summer or early fall.  There has been no change in bowel or bladder habits. She's had no leg swelling. Skin has had no rashes. There has been no fevers sweats or chills.  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   She's had no problems with her appetite. Again she is watching what she eats.  Medications:  Current outpatient prescriptions:  .  aspirin 81 MG tablet, Take 81 mg by mouth daily. , Disp: , Rfl:  .  budesonide-formoterol (SYMBICORT) 80-4.5 MCG/ACT inhaler, Inhale 1 puff into the lungs daily. Uses with seasonal allergies in the Spring and the Fall., Disp: , Rfl:  .  cetirizine (ZYRTEC) 10 MG tablet, Take 10 mg by mouth daily with breakfast. , Disp: , Rfl:  .  enalapril (VASOTEC) 2.5 MG tablet, Take 1 tablet (2.5 mg total) by mouth daily., Disp: 30 tablet, Rfl: 2 .  fluticasone (FLONASE) 50 MCG/ACT nasal spray, Place 2 sprays into both nostrils daily., Disp: 16 g, Rfl: 6 .  glucose blood (ACCU-CHEK AVIVA PLUS) test strip, Use as directed once daily to check blood sugar.  Diagnosis code E11.9, Disp: 100 each, Rfl: 5 .  Lancets 30G MISC, Use as directed once daily to check blood sugar.  Diagnosis code E11.9, Disp: 100 each, Rfl: 5 .  lansoprazole (PREVACID) 15 MG capsule, Take 1 capsule (15  mg total) by mouth daily at 12 noon., Disp: , Rfl:  .  montelukast (SINGULAIR) 10 MG tablet, Take 10 mg by mouth at bedtime. , Disp: , Rfl:  .  Multiple Vitamins-Minerals (MULTIVITAMIN WITH MINERALS) tablet, Take 1 tablet by mouth daily.  , Disp: , Rfl:  .  Omega-3 Fatty Acids (FISH OIL CONCENTRATE) 1000 MG CAPS, Take by mouth 3 (three) times daily., Disp: , Rfl:  .  PROVENTIL HFA 108 (90 BASE) MCG/ACT inhaler, , Disp: , Rfl:   Allergies:  Allergies  Allergen Reactions  . Codeine Other (See Comments)    "makes me crazy"  . Sulfa Antibiotics Anaphylaxis    "died 3 times".  . Oxycodone     Past Medical History, Surgical history, Social history, and Family History were reviewed and updated.  Review of Systems: As above  Physical Exam:  height is 5\' 8"  (1.727 m) and weight is 196 lb (88.905 kg). Her oral temperature is 97.7 F (36.5 C). Her blood pressure is 133/77 and her pulse is 63. Her respiration is 16.   Well-developed and well-nourished African American female. Head and neck exam shows no ocular or oral lesions. She has no palpable cervical or supraclavicular lymph nodes. Lungs are clear bilaterally. Cardiac exam shows a regular rate and rhythm with no murmurs rubs or bruits. Abdomen is soft. Has good bowel  sounds. She does have a small anterior abdominal wall hernia. This is down by the umbilicus. It is not tender. It is reducible. There is no palpable abdominal mass. There is no fluid wave. There is no palpable liver or spleen tip. Extremities shows no clubbing cyanosis or edema. She has good range of motion of her joints. She has good strength in her extremities Neurological exam shows no focal neurological deficits. Skin exam no rashes ecchymoses or petechia.  Lab Results  Component Value Date   WBC 7.0 06/08/2014   HGB 13.5 06/08/2014   HCT 41.3 06/08/2014   MCV 88 06/08/2014   PLT 202 06/08/2014     Chemistry      Component Value Date/Time   NA 142 06/08/2014 0958   NA  140 02/06/2014 1449   K 4.2 06/08/2014 0958   K 4.1 02/06/2014 1449   CL 104 06/08/2014 0958   CL 107 02/06/2014 1449   CO2 26 06/08/2014 0958   CO2 23 02/06/2014 1449   BUN 17 06/08/2014 0958   BUN 17 02/06/2014 1449   CREATININE 0.7 06/08/2014 0958   CREATININE 0.7 02/06/2014 1449      Component Value Date/Time   CALCIUM 9.3 06/08/2014 0958   CALCIUM 9.4 02/06/2014 1449   ALKPHOS 57 06/08/2014 0958   ALKPHOS 57 02/06/2014 1449   AST 16 06/08/2014 0958   AST 18 02/06/2014 1449   ALT 14 06/08/2014 0958   ALT 15 02/06/2014 1449   BILITOT 0.70 06/08/2014 0958   BILITOT 0.5 02/06/2014 1449         Impression and Plan: Vicki Dennis is 69 year old female with an idiopathic portal vein thrombus. She was treated with anticoagulation with Coumadin. She did well. She was on blood thinner for one year.  We have her on aspirin. She's doing well with the aspirin. I see no problems with continuing her on aspirin.  I'll plan to get her back in about 4 months time.   [name], MD 2/26/201611:07 AM

## 2014-06-15 ENCOUNTER — Other Ambulatory Visit: Payer: Self-pay | Admitting: Family Medicine

## 2014-07-17 ENCOUNTER — Telehealth: Payer: Self-pay | Admitting: Family Medicine

## 2014-07-17 NOTE — Telephone Encounter (Signed)
Pre Visit letter sent  °

## 2014-08-03 ENCOUNTER — Telehealth: Payer: Self-pay

## 2014-08-03 NOTE — Telephone Encounter (Signed)
[id number] 

## 2014-08-06 ENCOUNTER — Encounter: Payer: Self-pay | Admitting: Family Medicine

## 2014-08-06 ENCOUNTER — Ambulatory Visit (INDEPENDENT_AMBULATORY_CARE_PROVIDER_SITE_OTHER): Payer: Medicare Other | Admitting: Family Medicine

## 2014-08-06 VITALS — BP 116/70 | HR 76 | Temp 98.1°F | Resp 16 | Ht 66.0 in | Wt 196.0 lb

## 2014-08-06 DIAGNOSIS — Z0000 Encounter for general adult medical examination without abnormal findings: Secondary | ICD-10-CM | POA: Insufficient documentation

## 2014-08-06 DIAGNOSIS — E119 Type 2 diabetes mellitus without complications: Secondary | ICD-10-CM

## 2014-08-06 DIAGNOSIS — E782 Mixed hyperlipidemia: Secondary | ICD-10-CM

## 2014-08-06 DIAGNOSIS — K219 Gastro-esophageal reflux disease without esophagitis: Secondary | ICD-10-CM

## 2014-08-06 DIAGNOSIS — I10 Essential (primary) hypertension: Secondary | ICD-10-CM

## 2014-08-06 DIAGNOSIS — K429 Umbilical hernia without obstruction or gangrene: Secondary | ICD-10-CM | POA: Diagnosis not present

## 2014-08-06 DIAGNOSIS — T7840XA Allergy, unspecified, initial encounter: Secondary | ICD-10-CM

## 2014-08-06 DIAGNOSIS — E669 Obesity, unspecified: Secondary | ICD-10-CM

## 2014-08-06 DIAGNOSIS — E785 Hyperlipidemia, unspecified: Secondary | ICD-10-CM | POA: Diagnosis not present

## 2014-08-06 DIAGNOSIS — J4520 Mild intermittent asthma, uncomplicated: Secondary | ICD-10-CM

## 2014-08-06 DIAGNOSIS — E1169 Type 2 diabetes mellitus with other specified complication: Secondary | ICD-10-CM

## 2014-08-06 HISTORY — DX: Encounter for general adult medical examination without abnormal findings: Z00.00

## 2014-08-06 LAB — CBC
HCT: 41.2 % (ref 36.0–46.0)
Hemoglobin: 13.5 g/dL (ref 12.0–15.0)
MCHC: 32.7 g/dL (ref 30.0–36.0)
MCV: 88 fl (ref 78.0–100.0)
PLATELETS: 184 10*3/uL (ref 150.0–400.0)
RBC: 4.68 Mil/uL (ref 3.87–5.11)
RDW: 15 % (ref 11.5–15.5)
WBC: 6.1 10*3/uL (ref 4.0–10.5)

## 2014-08-06 LAB — COMPREHENSIVE METABOLIC PANEL
ALK PHOS: 61 U/L (ref 39–117)
ALT: 16 U/L (ref 0–35)
AST: 15 U/L (ref 0–37)
Albumin: 4.1 g/dL (ref 3.5–5.2)
BUN: 19 mg/dL (ref 6–23)
CO2: 27 meq/L (ref 19–32)
Calcium: 9.6 mg/dL (ref 8.4–10.5)
Chloride: 106 mEq/L (ref 96–112)
Creatinine, Ser: 0.69 mg/dL (ref 0.40–1.20)
GFR: 107.8 mL/min (ref >60.00)
Glucose, Bld: 89 mg/dL (ref 70–99)
POTASSIUM: 4.3 meq/L (ref 3.5–5.1)
Sodium: 138 mEq/L (ref 135–145)
Total Bilirubin: 0.4 mg/dL (ref 0.2–1.2)
Total Protein: 6.9 g/dL (ref 6.0–8.3)

## 2014-08-06 LAB — LIPID PANEL
CHOL/HDL RATIO: 3
CHOLESTEROL: 226 mg/dL — AB (ref 0–200)
HDL: 65 mg/dL (ref >39.00)
LDL Cholesterol: 138 mg/dL — ABNORMAL HIGH (ref 0–99)
NONHDL: 161
Triglycerides: 117 mg/dL (ref 0.0–149.0)
VLDL: 23.4 mg/dL (ref 0.0–40.0)

## 2014-08-06 LAB — TSH: TSH: 0.8 u[IU]/mL (ref 0.35–4.50)

## 2014-08-06 LAB — HEMOGLOBIN A1C: Hgb A1c MFr Bld: 6.6 % — ABNORMAL HIGH (ref 4.6–6.5)

## 2014-08-06 MED ORDER — FAMOTIDINE 20 MG PO TABS: 20.0000 mg | ORAL_TABLET | Freq: Every day | ORAL | Status: DC | PRN

## 2014-08-06 NOTE — Patient Instructions (Signed)
Preventive Care for Adults A healthy lifestyle and preventive care can promote health and wellness. Preventive health guidelines for women include the following key practices.  A routine yearly physical is a good way to check with your health care provider about your health and preventive screening. It is a chance to share any concerns and updates on your health and to receive a thorough exam.  Visit your dentist for a routine exam and preventive care every 6 months. Brush your teeth twice a day and floss once a day. Good oral hygiene prevents tooth decay and gum disease.  The frequency of eye exams is based on your age, health, family medical history, use of contact lenses, and other factors. Follow your health care provider's recommendations for frequency of eye exams.  Eat a healthy diet. Foods like vegetables, fruits, whole grains, low-fat dairy products, and lean protein foods contain the nutrients you need without too many calories. Decrease your intake of foods high in solid fats, added sugars, and salt. Eat the right amount of calories for you.Get information about a proper diet from your health care provider, if necessary.  Regular physical exercise is one of the most important things you can do for your health. Most adults should get at least 150 minutes of moderate-intensity exercise (any activity that increases your heart rate and causes you to sweat) each week. In addition, most adults need muscle-strengthening exercises on 2 or more days a week.  Maintain a healthy weight. The body mass index (BMI) is a screening tool to identify possible weight problems. It provides an estimate of body fat based on height and weight. Your health care provider can find your BMI and can help you achieve or maintain a healthy weight.For adults 20 years and older:  A BMI below 18.5 is considered underweight.  A BMI of 18.5 to 24.9 is normal.  A BMI of 25 to 29.9 is considered overweight.  A BMI of  30 and above is considered obese.  Maintain normal blood lipids and cholesterol levels by exercising and minimizing your intake of saturated fat. Eat a balanced diet with plenty of fruit and vegetables. Blood tests for lipids and cholesterol should begin at age 76 and be repeated every 5 years. If your lipid or cholesterol levels are high, you are over 50, or you are at high risk for heart disease, you may need your cholesterol levels checked more frequently.Ongoing high lipid and cholesterol levels should be treated with medicines if diet and exercise are not working.  If you smoke, find out from your health care provider how to quit. If you do not use tobacco, do not start.  Lung cancer screening is recommended for adults aged 22-80 years who are at high risk for developing lung cancer because of a history of smoking. A yearly low-dose CT scan of the lungs is recommended for people who have at least a 30-pack-year history of smoking and are a current smoker or have quit within the past 15 years. A pack year of smoking is smoking an average of 1 pack of cigarettes a day for 1 year (for example: 1 pack a day for 30 years or 2 packs a day for 15 years). Yearly screening should continue until the smoker has stopped smoking for at least 15 years. Yearly screening should be stopped for people who develop a health problem that would prevent them from having lung cancer treatment.  If you are pregnant, do not drink alcohol. If you are breastfeeding,  be very cautious about drinking alcohol. If you are not pregnant and choose to drink alcohol, do not have more than 1 drink per day. One drink is considered to be 12 ounces (355 mL) of beer, 5 ounces (148 mL) of wine, or 1.5 ounces (44 mL) of liquor.  Avoid use of street drugs. Do not share needles with anyone. Ask for help if you need support or instructions about stopping the use of drugs.  High blood pressure causes heart disease and increases the risk of  stroke. Your blood pressure should be checked at least every 1 to 2 years. Ongoing high blood pressure should be treated with medicines if weight loss and exercise do not work.  If you are 75-52 years old, ask your health care provider if you should take aspirin to prevent strokes.  Diabetes screening involves taking a blood sample to check your fasting blood sugar level. This should be done once every 3 years, after age 15, if you are within normal weight and without risk factors for diabetes. Testing should be considered at a younger age or be carried out more frequently if you are overweight and have at least 1 risk factor for diabetes.  Breast cancer screening is essential preventive care for women. You should practice "breast self-awareness." This means understanding the normal appearance and feel of your breasts and may include breast self-examination. Any changes detected, no matter how small, should be reported to a health care provider. Women in their 58s and 30s should have a clinical breast exam (CBE) by a health care provider as part of a regular health exam every 1 to 3 years. After age 16, women should have a CBE every year. Starting at age 53, women should consider having a mammogram (breast X-ray test) every year. Women who have a family history of breast cancer should talk to their health care provider about genetic screening. Women at a high risk of breast cancer should talk to their health care providers about having an MRI and a mammogram every year.  Breast cancer gene (BRCA)-related cancer risk assessment is recommended for women who have family members with BRCA-related cancers. BRCA-related cancers include breast, ovarian, tubal, and peritoneal cancers. Having family members with these cancers may be associated with an increased risk for harmful changes (mutations) in the breast cancer genes BRCA1 and BRCA2. Results of the assessment will determine the need for genetic counseling and  BRCA1 and BRCA2 testing.  Routine pelvic exams to screen for cancer are no longer recommended for nonpregnant women who are considered low risk for cancer of the pelvic organs (ovaries, uterus, and vagina) and who do not have symptoms. Ask your health care provider if a screening pelvic exam is right for you.  If you have had past treatment for cervical cancer or a condition that could lead to cancer, you need Pap tests and screening for cancer for at least 20 years after your treatment. If Pap tests have been discontinued, your risk factors (such as having a new sexual partner) need to be reassessed to determine if screening should be resumed. Some women have medical problems that increase the chance of getting cervical cancer. In these cases, your health care provider may recommend more frequent screening and Pap tests.  The HPV test is an additional test that may be used for cervical cancer screening. The HPV test looks for the virus that can cause the cell changes on the cervix. The cells collected during the Pap test can be  tested for HPV. The HPV test could be used to screen women aged 30 years and older, and should be used in women of any age who have unclear Pap test results. After the age of 30, women should have HPV testing at the same frequency as a Pap test.  Colorectal cancer can be detected and often prevented. Most routine colorectal cancer screening begins at the age of 50 years and continues through age 75 years. However, your health care provider may recommend screening at an earlier age if you have risk factors for colon cancer. On a yearly basis, your health care provider may provide home test kits to check for hidden blood in the stool. Use of a small camera at the end of a tube, to directly examine the colon (sigmoidoscopy or colonoscopy), can detect the earliest forms of colorectal cancer. Talk to your health care provider about this at age 50, when routine screening begins. Direct  exam of the colon should be repeated every 5-10 years through age 75 years, unless early forms of pre-cancerous polyps or small growths are found.  People who are at an increased risk for hepatitis B should be screened for this virus. You are considered at high risk for hepatitis B if:  You were born in a country where hepatitis B occurs often. Talk with your health care provider about which countries are considered high risk.  Your parents were born in a high-risk country and you have not received a shot to protect against hepatitis B (hepatitis B vaccine).  You have HIV or AIDS.  You use needles to inject street drugs.  You live with, or have sex with, someone who has hepatitis B.  You get hemodialysis treatment.  You take certain medicines for conditions like cancer, organ transplantation, and autoimmune conditions.  Hepatitis C blood testing is recommended for all people born from 1945 through 1965 and any individual with known risks for hepatitis C.  Practice safe sex. Use condoms and avoid high-risk sexual practices to reduce the spread of sexually transmitted infections (STIs). STIs include gonorrhea, chlamydia, syphilis, trichomonas, herpes, HPV, and human immunodeficiency virus (HIV). Herpes, HIV, and HPV are viral illnesses that have no cure. They can result in disability, cancer, and death.  You should be screened for sexually transmitted illnesses (STIs) including gonorrhea and chlamydia if:  You are sexually active and are younger than 24 years.  You are older than 24 years and your health care provider tells you that you are at risk for this type of infection.  Your sexual activity has changed since you were last screened and you are at an increased risk for chlamydia or gonorrhea. Ask your health care provider if you are at risk.  If you are at risk of being infected with HIV, it is recommended that you take a prescription medicine daily to prevent HIV infection. This is  called preexposure prophylaxis (PrEP). You are considered at risk if:  You are a heterosexual woman, are sexually active, and are at increased risk for HIV infection.  You take drugs by injection.  You are sexually active with a partner who has HIV.  Talk with your health care provider about whether you are at high risk of being infected with HIV. If you choose to begin PrEP, you should first be tested for HIV. You should then be tested every 3 months for as long as you are taking PrEP.  Osteoporosis is a disease in which the bones lose minerals and strength   with aging. This can result in serious bone fractures or breaks. The risk of osteoporosis can be identified using a bone density scan. Women ages 65 years and over and women at risk for fractures or osteoporosis should discuss screening with their health care providers. Ask your health care provider whether you should take a calcium supplement or vitamin D to reduce the rate of osteoporosis.  Menopause can be associated with physical symptoms and risks. Hormone replacement therapy is available to decrease symptoms and risks. You should talk to your health care provider about whether hormone replacement therapy is right for you.  Use sunscreen. Apply sunscreen liberally and repeatedly throughout the day. You should seek shade when your shadow is shorter than you. Protect yourself by wearing long sleeves, pants, a wide-brimmed hat, and sunglasses year round, whenever you are outdoors.  Once a month, do a whole body skin exam, using a mirror to look at the skin on your back. Tell your health care provider of new moles, moles that have irregular borders, moles that are larger than a pencil eraser, or moles that have changed in shape or color.  Stay current with required vaccines (immunizations).  Influenza vaccine. All adults should be immunized every year.  Tetanus, diphtheria, and acellular pertussis (Td, Tdap) vaccine. Pregnant women should  receive 1 dose of Tdap vaccine during each pregnancy. The dose should be obtained regardless of the length of time since the last dose. Immunization is preferred during the 27th-36th week of gestation. An adult who has not previously received Tdap or who does not know her vaccine status should receive 1 dose of Tdap. This initial dose should be followed by tetanus and diphtheria toxoids (Td) booster doses every 10 years. Adults with an unknown or incomplete history of completing a 3-dose immunization series with Td-containing vaccines should begin or complete a primary immunization series including a Tdap dose. Adults should receive a Td booster every 10 years.  Varicella vaccine. An adult without evidence of immunity to varicella should receive 2 doses or a second dose if she has previously received 1 dose. Pregnant females who do not have evidence of immunity should receive the first dose after pregnancy. This first dose should be obtained before leaving the health care facility. The second dose should be obtained 4-8 weeks after the first dose.  Human papillomavirus (HPV) vaccine. Females aged 13-26 years who have not received the vaccine previously should obtain the 3-dose series. The vaccine is not recommended for use in pregnant females. However, pregnancy testing is not needed before receiving a dose. If a female is found to be pregnant after receiving a dose, no treatment is needed. In that case, the remaining doses should be delayed until after the pregnancy. Immunization is recommended for any person with an immunocompromised condition through the age of 26 years if she did not get any or all doses earlier. During the 3-dose series, the second dose should be obtained 4-8 weeks after the first dose. The third dose should be obtained 24 weeks after the first dose and 16 weeks after the second dose.  Zoster vaccine. One dose is recommended for adults aged 60 years or older unless certain conditions are  present.  Measles, mumps, and rubella (MMR) vaccine. Adults born before 1957 generally are considered immune to measles and mumps. Adults born in 1957 or later should have 1 or more doses of MMR vaccine unless there is a contraindication to the vaccine or there is laboratory evidence of immunity to   each of the three diseases. A routine second dose of MMR vaccine should be obtained at least 28 days after the first dose for students attending postsecondary schools, health care workers, or international travelers. People who received inactivated measles vaccine or an unknown type of measles vaccine during 1963-1967 should receive 2 doses of MMR vaccine. People who received inactivated mumps vaccine or an unknown type of mumps vaccine before 1979 and are at high risk for mumps infection should consider immunization with 2 doses of MMR vaccine. For females of childbearing age, rubella immunity should be determined. If there is no evidence of immunity, females who are not pregnant should be vaccinated. If there is no evidence of immunity, females who are pregnant should delay immunization until after pregnancy. Unvaccinated health care workers born before 1957 who lack laboratory evidence of measles, mumps, or rubella immunity or laboratory confirmation of disease should consider measles and mumps immunization with 2 doses of MMR vaccine or rubella immunization with 1 dose of MMR vaccine.  Pneumococcal 13-valent conjugate (PCV13) vaccine. When indicated, a person who is uncertain of her immunization history and has no record of immunization should receive the PCV13 vaccine. An adult aged 19 years or older who has certain medical conditions and has not been previously immunized should receive 1 dose of PCV13 vaccine. This PCV13 should be followed with a dose of pneumococcal polysaccharide (PPSV23) vaccine. The PPSV23 vaccine dose should be obtained at least 8 weeks after the dose of PCV13 vaccine. An adult aged 19  years or older who has certain medical conditions and previously received 1 or more doses of PPSV23 vaccine should receive 1 dose of PCV13. The PCV13 vaccine dose should be obtained 1 or more years after the last PPSV23 vaccine dose.  Pneumococcal polysaccharide (PPSV23) vaccine. When PCV13 is also indicated, PCV13 should be obtained first. All adults aged 65 years and older should be immunized. An adult younger than age 65 years who has certain medical conditions should be immunized. Any person who resides in a nursing home or long-term care facility should be immunized. An adult smoker should be immunized. People with an immunocompromised condition and certain other conditions should receive both PCV13 and PPSV23 vaccines. People with human immunodeficiency virus (HIV) infection should be immunized as soon as possible after diagnosis. Immunization during chemotherapy or radiation therapy should be avoided. Routine use of PPSV23 vaccine is not recommended for American Indians, [place] Natives, or people younger than 65 years unless there are medical conditions that require PPSV23 vaccine. When indicated, people who have unknown immunization and have no record of immunization should receive PPSV23 vaccine. One-time revaccination 5 years after the first dose of PPSV23 is recommended for people aged 19-64 years who have chronic kidney failure, nephrotic syndrome, asplenia, or immunocompromised conditions. People who received 1-2 doses of PPSV23 before age 65 years should receive another dose of PPSV23 vaccine at age 65 years or later if at least 5 years have passed since the previous dose. Doses of PPSV23 are not needed for people immunized with PPSV23 at or after age 65 years.  Meningococcal vaccine. Adults with asplenia or persistent complement component deficiencies should receive 2 doses of quadrivalent meningococcal conjugate (MenACWY-D) vaccine. The doses should be obtained at least 2 months apart.  Microbiologists working with certain meningococcal bacteria, military recruits, people at risk during an outbreak, and people who travel to or live in countries with a high rate of meningitis should be immunized. A first-year college student up through age   21 years who is living in a residence hall should receive a dose if she did not receive a dose on or after her 16th birthday. Adults who have certain high-risk conditions should receive one or more doses of vaccine.  Hepatitis A vaccine. Adults who wish to be protected from this disease, have certain high-risk conditions, work with hepatitis A-infected animals, work in hepatitis A research labs, or travel to or work in countries with a high rate of hepatitis A should be immunized. Adults who were previously unvaccinated and who anticipate close contact with an international adoptee during the first 60 days after arrival in the [place] from a country with a high rate of hepatitis A should be immunized.  Hepatitis B vaccine. Adults who wish to be protected from this disease, have certain high-risk conditions, may be exposed to blood or other infectious body fluids, are household contacts or sex partners of hepatitis B positive people, are clients or workers in certain care facilities, or travel to or work in countries with a high rate of hepatitis B should be immunized.  Haemophilus influenzae type b (Hib) vaccine. A previously unvaccinated person with asplenia or sickle cell disease or having a scheduled splenectomy should receive 1 dose of Hib vaccine. Regardless of previous immunization, a recipient of a hematopoietic stem cell transplant should receive a 3-dose series 6-12 months after her successful transplant. Hib vaccine is not recommended for adults with HIV infection. Preventive Services / Frequency Ages 64 to 68 years  Blood pressure check.** / Every 1 to 2 years.  Lipid and cholesterol check.** / Every 5 years beginning at age  22.  Clinical breast exam.** / Every 3 years for women in their 88s and 53s.  BRCA-related cancer risk assessment.** / For women who have family members with a BRCA-related cancer (breast, ovarian, tubal, or peritoneal cancers).  Pap test.** / Every 2 years from ages 90 through 51. Every 3 years starting at age 21 through age 56 or 3 with a history of 3 consecutive normal Pap tests.  HPV screening.** / Every 3 years from ages 24 through ages 1 to 46 with a history of 3 consecutive normal Pap tests.  Hepatitis C blood test.** / For any individual with known risks for hepatitis C.  Skin self-exam. / Monthly.  Influenza vaccine. / Every year.  Tetanus, diphtheria, and acellular pertussis (Tdap, Td) vaccine.** / Consult your health care provider. Pregnant women should receive 1 dose of Tdap vaccine during each pregnancy. 1 dose of Td every 10 years.  Varicella vaccine.** / Consult your health care provider. Pregnant females who do not have evidence of immunity should receive the first dose after pregnancy.  HPV vaccine. / 3 doses over 6 months, if 72 and younger. The vaccine is not recommended for use in pregnant females. However, pregnancy testing is not needed before receiving a dose.  Measles, mumps, rubella (MMR) vaccine.** / You need at least 1 dose of MMR if you were born in 1957 or later. You may also need a 2nd dose. For females of childbearing age, rubella immunity should be determined. If there is no evidence of immunity, females who are not pregnant should be vaccinated. If there is no evidence of immunity, females who are pregnant should delay immunization until after pregnancy.  Pneumococcal 13-valent conjugate (PCV13) vaccine.** / Consult your health care provider.  Pneumococcal polysaccharide (PPSV23) vaccine.** / 1 to 2 doses if you smoke cigarettes or if you have certain conditions.  Meningococcal vaccine.** /  1 dose if you are age 19 to 21 years and a first-year college  student living in a residence hall, or have one of several medical conditions, you need to get vaccinated against meningococcal disease. You may also need additional booster doses.  Hepatitis A vaccine.** / Consult your health care provider.  Hepatitis B vaccine.** / Consult your health care provider.  Haemophilus influenzae type b (Hib) vaccine.** / Consult your health care provider. Ages 40 to 64 years  Blood pressure check.** / Every 1 to 2 years.  Lipid and cholesterol check.** / Every 5 years beginning at age 20 years.  Lung cancer screening. / Every year if you are aged 55-80 years and have a 30-pack-year history of smoking and currently smoke or have quit within the past 15 years. Yearly screening is stopped once you have quit smoking for at least 15 years or develop a health problem that would prevent you from having lung cancer treatment.  Clinical breast exam.** / Every year after age 40 years.  BRCA-related cancer risk assessment.** / For women who have family members with a BRCA-related cancer (breast, ovarian, tubal, or peritoneal cancers).  Mammogram.** / Every year beginning at age 40 years and continuing for as long as you are in good health. Consult with your health care provider.  Pap test.** / Every 3 years starting at age 30 years through age 65 or 70 years with a history of 3 consecutive normal Pap tests.  HPV screening.** / Every 3 years from ages 30 years through ages 65 to 70 years with a history of 3 consecutive normal Pap tests.  Fecal occult blood test (FOBT) of stool. / Every year beginning at age 50 years and continuing until age 75 years. You may not need to do this test if you get a colonoscopy every 10 years.  Flexible sigmoidoscopy or colonoscopy.** / Every 5 years for a flexible sigmoidoscopy or every 10 years for a colonoscopy beginning at age 50 years and continuing until age 75 years.  Hepatitis C blood test.** / For all people born from 1945 through  1965 and any individual with known risks for hepatitis C.  Skin self-exam. / Monthly.  Influenza vaccine. / Every year.  Tetanus, diphtheria, and acellular pertussis (Tdap/Td) vaccine.** / Consult your health care provider. Pregnant women should receive 1 dose of Tdap vaccine during each pregnancy. 1 dose of Td every 10 years.  Varicella vaccine.** / Consult your health care provider. Pregnant females who do not have evidence of immunity should receive the first dose after pregnancy.  Zoster vaccine.** / 1 dose for adults aged 60 years or older.  Measles, mumps, rubella (MMR) vaccine.** / You need at least 1 dose of MMR if you were born in 1957 or later. You may also need a 2nd dose. For females of childbearing age, rubella immunity should be determined. If there is no evidence of immunity, females who are not pregnant should be vaccinated. If there is no evidence of immunity, females who are pregnant should delay immunization until after pregnancy.  Pneumococcal 13-valent conjugate (PCV13) vaccine.** / Consult your health care provider.  Pneumococcal polysaccharide (PPSV23) vaccine.** / 1 to 2 doses if you smoke cigarettes or if you have certain conditions.  Meningococcal vaccine.** / Consult your health care provider.  Hepatitis A vaccine.** / Consult your health care provider.  Hepatitis B vaccine.** / Consult your health care provider.  Haemophilus influenzae type b (Hib) vaccine.** / Consult your health care provider. Ages 65   years and over  Blood pressure check.** / Every 1 to 2 years.  Lipid and cholesterol check.** / Every 5 years beginning at age 22 years.  Lung cancer screening. / Every year if you are aged 73-80 years and have a 30-pack-year history of smoking and currently smoke or have quit within the past 15 years. Yearly screening is stopped once you have quit smoking for at least 15 years or develop a health problem that would prevent you from having lung cancer  treatment.  Clinical breast exam.** / Every year after age 4 years.  BRCA-related cancer risk assessment.** / For women who have family members with a BRCA-related cancer (breast, ovarian, tubal, or peritoneal cancers).  Mammogram.** / Every year beginning at age 40 years and continuing for as long as you are in good health. Consult with your health care provider.  Pap test.** / Every 3 years starting at age 9 years through age 34 or 91 years with 3 consecutive normal Pap tests. Testing can be stopped between 65 and 70 years with 3 consecutive normal Pap tests and no abnormal Pap or HPV tests in the past 10 years.  HPV screening.** / Every 3 years from ages 57 years through ages 64 or 45 years with a history of 3 consecutive normal Pap tests. Testing can be stopped between 65 and 70 years with 3 consecutive normal Pap tests and no abnormal Pap or HPV tests in the past 10 years.  Fecal occult blood test (FOBT) of stool. / Every year beginning at age 15 years and continuing until age 17 years. You may not need to do this test if you get a colonoscopy every 10 years.  Flexible sigmoidoscopy or colonoscopy.** / Every 5 years for a flexible sigmoidoscopy or every 10 years for a colonoscopy beginning at age 86 years and continuing until age 71 years.  Hepatitis C blood test.** / For all people born from [birth year] through 1967 and any individual with known risks for hepatitis C.  Osteoporosis screening.** / A one-time screening for women ages 83 years and over and women at risk for fractures or osteoporosis.  Skin self-exam. / Monthly.  Influenza vaccine. / Every year.  Tetanus, diphtheria, and acellular pertussis (Tdap/Td) vaccine.** / 1 dose of Td every 10 years.  Varicella vaccine.** / Consult your health care provider.  Zoster vaccine.** / 1 dose for adults aged 61 years or older.  Pneumococcal 13-valent conjugate (PCV13) vaccine.** / Consult your health care provider.  Pneumococcal  polysaccharide (PPSV23) vaccine.** / 1 dose for all adults aged 28 years and older.  Meningococcal vaccine.** / Consult your health care provider.  Hepatitis A vaccine.** / Consult your health care provider.  Hepatitis B vaccine.** / Consult your health care provider.  Haemophilus influenzae type b (Hib) vaccine.** / Consult your health care provider. ** Family history and personal history of risk and conditions may change your health care provider's recommendations. Document Released: 05/26/2001 Document Revised: 08/14/2013 Document Reviewed: 08/25/2010 [facility] Patient Information 2015 [place], [place]. This information is not intended to replace advice given to you by your health care provider. Make sure you discuss any questions you have with your health care provider.

## 2014-08-06 NOTE — Assessment & Plan Note (Signed)
Is stable on current meds.  No need for Epipen

## 2014-08-06 NOTE — Assessment & Plan Note (Signed)
Well controlled, no changes to meds. Encouraged heart healthy diet such as the DASH diet and exercise as tolerated.  °

## 2014-08-06 NOTE — Assessment & Plan Note (Signed)
Encouraged heart healthy diet, increase exercise, avoid trans fats, consider a krill oil cap daily 

## 2014-08-06 NOTE — Progress Notes (Signed)
Pre visit review using our clinic review tool, if applicable. No additional management support is needed unless otherwise documented below in the visit note. 

## 2014-08-06 NOTE — Progress Notes (Signed)
Vicki Dennis  [id number] [birth year] 08/06/2014      Progress Note-Follow Up  Subjective  Chief Complaint  Chief Complaint  Patient presents with  . Annual Exam    Some right sided abdominal pain. Also had hernia surgery a few years ago and still has some questions about it    HPI  Patient is a 69 y.o. female in today for routine medical care. Patient is in today for annual exam. Overall feeling well. Note she had a colonoscopy in October 2015. Does complain of some very vague intermittent right upper quadrant. Umbilical pain. Denies any change in bowels or other complaints. No nausea vomiting. No bloody or tarry stool. No fevers or chills. No recent illness or acute concerns. Is managing her activities of daily living well. No trouble with cooking, cleaning her self-care at home. Denies CP/palp/SOB/HA/congestion/fevers/GI or GU c/o. Taking meds as prescribed  Past Medical History  Diagnosis Date  . Hyperlipidemia   . Hypertension   . Portal vein thrombosis 03/09/2011    coumadin ended summer of 2013  . Asthma   . Blood transfusion without reported diagnosis   . GERD (gastroesophageal reflux disease)     rare use of OTC aid  . Anemia     h/o  . Pneumonia     as a child  . Diabetes mellitus     type-II, per Dr. Parke Simmers, no meds. , pt. reports weight loss has contributed  to control of diabetes.  She reports last HgbA1c- wnl.  . Diabetes mellitus 11/05/2010  . Chicken pox as a child  . Measles as a child  . Mumps as a child  . Diabetes mellitus type 2, uncontrolled 11/05/2010  . Allergy   . Diabetes mellitus type 2 in obese 11/05/2010  . Obesity 02/10/2014  . Esophageal reflux 02/10/2014  . Seborrheic keratosis 02/10/2014    Follows with dermatology  . Asthma, mild intermittent, well-controlled 02/10/2014    Past Surgical History  Procedure Laterality Date  . Knee arthroscopy w/ meniscal repair      both knees  . Tonsillectomy    . Umbilical hernia repair  03/16/2012      Procedure: HERNIA REPAIR UMBILICAL ADULT;  Surgeon: Shelly Rubenstein, MD;  Location: [facility] OR;  Service: General;  Laterality: N/A;  . Insertion of mesh  03/16/2012    Procedure: INSERTION OF MESH;  Surgeon: Shelly Rubenstein, MD;  Location: [facility] OR;  Service: General;  Laterality: N/A;  . Breast surgery      benign - cysts    Family History  Problem Relation Age of Onset  . Cancer Father     prostate  . Cancer Maternal Aunt   . Aneurysm Mother 52    brain    History   Social History  . Marital Status: Divorced    Spouse Name: N/A  . Number of Children: N/A  . Years of Education: N/A   Occupational History  . Not on file.   Social History Main Topics  . Smoking status: Never Smoker   . Smokeless tobacco: Never Used     Comment: never used tobacco  . Alcohol Use: 0.0 oz/week    0 Standard drinks or equivalent per week     Comment: for holidays  . Drug Use: No  . Sexual Activity: No     Comment: lives alone    Other Topics Concern  . Not on file   Social History Narrative    Current Outpatient Prescriptions  on File Prior to Visit  Medication Sig Dispense Refill  . aspirin 81 MG tablet Take 81 mg by mouth daily.     . budesonide-formoterol (SYMBICORT) 80-4.5 MCG/ACT inhaler Inhale 1 puff into the lungs daily. Uses with seasonal allergies in the Spring and the Fall.    . cetirizine (ZYRTEC) 10 MG tablet Take 10 mg by mouth daily with breakfast.     . enalapril (VASOTEC) 2.5 MG tablet Take 1 tablet (2.5 mg total) by mouth daily. 30 tablet 2  . enalapril (VASOTEC) 2.5 MG tablet TAKE 1 TABLET BY MOUTH DAILY 30 tablet 2  . fluticasone (FLONASE) 50 MCG/ACT nasal spray Place 2 sprays into both nostrils daily. 16 g 6  . glucose blood (ACCU-CHEK AVIVA PLUS) test strip Use as directed once daily to check blood sugar.  Diagnosis code E11.9 100 each 5  . Lancets 30G MISC Use as directed once daily to check blood sugar.  Diagnosis code E11.9 100 each 5  . montelukast  (SINGULAIR) 10 MG tablet Take 10 mg by mouth at bedtime.     . Multiple Vitamins-Minerals (MULTIVITAMIN WITH MINERALS) tablet Take 1 tablet by mouth daily.      . Omega-3 Fatty Acids (FISH OIL CONCENTRATE) 1000 MG CAPS Take by mouth 3 (three) times daily.    Marland Kitchen PROVENTIL HFA 108 (90 BASE) MCG/ACT inhaler      No current facility-administered medications on file prior to visit.    Allergies  Allergen Reactions  . Codeine Other (See Comments)    "makes me crazy"  . Sulfa Antibiotics Anaphylaxis    "died 3 times".  . Oxycodone     Review of Systems  Review of Systems  Constitutional: Negative for fever, chills and malaise/fatigue.  HENT: Negative for congestion, hearing loss and nosebleeds.   Eyes: Negative for discharge.  Respiratory: Negative for cough, sputum production, shortness of breath and wheezing.   Cardiovascular: Negative for chest pain, palpitations and leg swelling.  Gastrointestinal: Positive for abdominal pain. Negative for heartburn, nausea, vomiting, diarrhea, constipation and blood in stool.  Genitourinary: Negative for dysuria, urgency, frequency and hematuria.  Musculoskeletal: Negative for myalgias, back pain and falls.  Skin: Negative for rash.  Neurological: Negative for dizziness, tremors, sensory change, focal weakness, loss of consciousness, weakness and headaches.  Endo/Heme/Allergies: Negative for polydipsia. Does not bruise/bleed easily.  Psychiatric/Behavioral: Negative for depression and suicidal ideas. The patient is not nervous/anxious and does not have insomnia.     Objective  BP 116/70 mmHg  Pulse 76  Temp(Src) 98.1 F (36.7 C) (Oral)  Resp 16  Ht 5\' 6"  (1.676 m)  Wt 196 lb (88.905 kg)  BMI 31.65 kg/m2  SpO2 98%  Physical Exam  Physical Exam  Constitutional: She is oriented to person, place, and time and well-developed, well-nourished, and in no distress. No distress.  HENT:  Head: Normocephalic and atraumatic.  Right Ear: External  ear normal.  Left Ear: External ear normal.  Nose: Nose normal.  Mouth/Throat: Oropharynx is clear and moist. No oropharyngeal exudate.  Eyes: Conjunctivae are normal. Pupils are equal, round, and reactive to light. Right eye exhibits no discharge. Left eye exhibits no discharge. No scleral icterus.  Neck: Normal range of motion. Neck supple. No thyromegaly present.  Cardiovascular: Normal rate, regular rhythm, normal heart sounds and intact distal pulses.   No murmur heard. Pulmonary/Chest: Effort normal and breath sounds normal. No respiratory distress. She has no wheezes. She has no rales.  Abdominal: Soft. Bowel sounds are normal.  She exhibits no distension and no mass. There is no tenderness.  Musculoskeletal: Normal range of motion. She exhibits no edema or tenderness.  Lymphadenopathy:    She has no cervical adenopathy.  Neurological: She is alert and oriented to person, place, and time. She has normal reflexes. No cranial nerve deficit. Coordination normal.  Skin: Skin is warm and dry. No rash noted. She is not diaphoretic.  Psychiatric: Mood, memory and affect normal.    Lab Results  Component Value Date   TSH 0.36 02/06/2014   Lab Results  Component Value Date   WBC 7.0 06/08/2014   HGB 13.5 06/08/2014   HCT 41.3 06/08/2014   MCV 88 06/08/2014   PLT 202 06/08/2014   Lab Results  Component Value Date   CREATININE 0.7 06/08/2014   BUN 17 06/08/2014   NA 142 06/08/2014   K 4.2 06/08/2014   CL 104 06/08/2014   CO2 26 06/08/2014   Lab Results  Component Value Date   ALT 14 06/08/2014   AST 16 06/08/2014   ALKPHOS 57 06/08/2014   BILITOT 0.70 06/08/2014   Lab Results  Component Value Date   CHOL 201* 02/06/2014   Lab Results  Component Value Date   HDL 54.00 02/06/2014   No results found for: [facility] Lab Results  Component Value Date   TRIG 210.0* 02/06/2014   Lab Results  Component Value Date   CHOLHDL 4 02/06/2014     Assessment & Plan  HTN  (hypertension) Well controlled, no changes to meds. Encouraged heart healthy diet such as the DASH diet and exercise as tolerated.    Esophageal reflux Avoid offending foods, start probiotics. Do not eat large meals in late evening and consider raising head of bed. Uses pepcid intermittently with good results   Hyperlipidemia Encouraged heart healthy diet, increase exercise, avoid trans fats, consider a krill oil cap daily.    Allergy Is stable on current meds.  No need for Epipen   Diabetes mellitus type 2 in obese hgba1c acceptable, minimize simple carbs. Increase exercise as tolerated. Continue current meds   Medicare annual wellness visit, subsequent Patient denies any difficulties at home. No trouble with ADLs, depression or falls. No recent changes to vision or hearing. Is UTD with immunizations. Is UTD with screening. Discussed Advanced Directives, patient agrees to bring [place] copies of documents if can. Encouraged heart healthy diet, exercise as tolerated and adequate sleep. Labs ordered today for annual exam See problem list for risk factors See AVS for preventativ health care schedule   Asthma, mild intermittent, well-controlled No recent flares, no change in meds   Obesity Encouraged DASH diet, decrease po intake and increase exercise as tolerated. Needs 7-8 hours of sleep nightly. Avoid trans fats, eat small, frequent meals every 4-5 hours with lean proteins, complex carbs and healthy fats. Minimize simple carbs, GMO foods.   Umbilical hernia Some mild, intermittent right sided abdominal pain, encouraged hi fiber, fluids and exercise and report if symptoms worsen   Preventative health care Patient encouraged to maintain heart healthy diet, regular exercise, adequate sleep. Consider daily probiotics. Take medications as prescribed. Annual labs ordered and reviewed.

## 2014-08-06 NOTE — Assessment & Plan Note (Signed)
Patient denies any difficulties at home. No trouble with ADLs, depression or falls. No recent changes to vision or hearing. Is UTD with immunizations. Is UTD with screening. Discussed Advanced Directives, patient agrees to bring [place] copies of documents if can. Encouraged heart healthy diet, exercise as tolerated and adequate sleep. Labs ordered today for annual exam See problem list for risk factors See AVS for preventativ health care schedule

## 2014-08-06 NOTE — Assessment & Plan Note (Signed)
hgba1c acceptable, minimize simple carbs. Increase exercise as tolerated. Continue current meds 

## 2014-08-06 NOTE — Assessment & Plan Note (Signed)
Avoid offending foods, start probiotics. Do not eat large meals in late evening and consider raising head of bed. Uses pepcid intermittently with good results

## 2014-08-12 ENCOUNTER — Encounter: Payer: Self-pay | Admitting: Family Medicine

## 2014-08-12 DIAGNOSIS — Z7185 Encounter for immunization safety counseling: Secondary | ICD-10-CM | POA: Insufficient documentation

## 2014-08-12 DIAGNOSIS — Z0000 Encounter for general adult medical examination without abnormal findings: Secondary | ICD-10-CM

## 2014-08-12 HISTORY — DX: Encounter for general adult medical examination without abnormal findings: Z00.00

## 2014-08-12 NOTE — Assessment & Plan Note (Signed)
No recent flares, no change in meds

## 2014-08-12 NOTE — Assessment & Plan Note (Signed)
Some mild, intermittent right sided abdominal pain, encouraged hi fiber, fluids and exercise and report if symptoms worsen

## 2014-08-12 NOTE — Assessment & Plan Note (Signed)
Patient encouraged to maintain heart healthy diet, regular exercise, adequate sleep. Consider daily probiotics. Take medications as prescribed. Annual labs ordered and reviewed.

## 2014-08-12 NOTE — Assessment & Plan Note (Signed)
Encouraged DASH diet, decrease po intake and increase exercise as tolerated. Needs 7-8 hours of sleep nightly. Avoid trans fats, eat small, frequent meals every 4-5 hours with lean proteins, complex carbs and healthy fats. Minimize simple carbs, GMO foods. 

## 2014-08-13 NOTE — Telephone Encounter (Signed)
Most recent hgba1c up to 6.6 from 6.5. No medication changes needed. Minimize simple carbs and trans fats. Still an acceptable number but stay as active as possible.

## 2014-08-14 NOTE — Telephone Encounter (Signed)
Patient has been informed of lab results and PCP instructions.

## 2014-09-18 ENCOUNTER — Other Ambulatory Visit: Payer: Self-pay | Admitting: Family Medicine

## 2014-10-03 ENCOUNTER — Telehealth: Payer: Self-pay | Admitting: Family Medicine

## 2014-10-03 NOTE — Telephone Encounter (Signed)
Caller name: Syan Relation to pt: Call back number:  [phone] Pharmacy:  Reason for call:   Requesting a note stating that she is on the medications that she is on. She will be out of the country in July and doesn't want customs thinking that she is smuggling anything.

## 2014-10-04 ENCOUNTER — Encounter: Payer: Self-pay | Admitting: Family Medicine

## 2014-10-04 NOTE — Telephone Encounter (Signed)
Called the patient left a detailed message letter done and to call back to let me know how she wants to get it.  Have already put one in the mail and one at the front desk to pickup.

## 2014-10-04 NOTE — Telephone Encounter (Signed)
Called the patient left a message requested letter has been mailed and also put at the front desk to pickup if she prefers instead of waiting for the mail.

## 2014-10-04 NOTE — Telephone Encounter (Signed)
Letter is done.

## 2014-10-05 ENCOUNTER — Encounter: Payer: Self-pay | Admitting: Family

## 2014-10-05 ENCOUNTER — Ambulatory Visit (HOSPITAL_BASED_OUTPATIENT_CLINIC_OR_DEPARTMENT_OTHER): Payer: Medicare Other | Admitting: Family

## 2014-10-05 ENCOUNTER — Other Ambulatory Visit (HOSPITAL_BASED_OUTPATIENT_CLINIC_OR_DEPARTMENT_OTHER): Payer: Medicare Other

## 2014-10-05 VITALS — BP 126/92 | HR 115 | Temp 98.0°F | Resp 18 | Ht 66.0 in | Wt 199.0 lb

## 2014-10-05 DIAGNOSIS — I81 Portal vein thrombosis: Secondary | ICD-10-CM | POA: Diagnosis not present

## 2014-10-05 DIAGNOSIS — E1169 Type 2 diabetes mellitus with other specified complication: Secondary | ICD-10-CM

## 2014-10-05 DIAGNOSIS — E669 Obesity, unspecified: Secondary | ICD-10-CM

## 2014-10-05 LAB — CBC WITH DIFFERENTIAL (CANCER CENTER ONLY)
BASO#: 0.1 10*3/uL (ref 0.0–0.2)
BASO%: 1 % (ref 0.0–2.0)
EOS ABS: 0.2 10*3/uL (ref 0.0–0.5)
EOS%: 3.8 % (ref 0.0–7.0)
HCT: 39.5 % (ref 34.8–46.6)
HEMOGLOBIN: 13.3 g/dL (ref 11.6–15.9)
LYMPH#: 2 10*3/uL (ref 0.9–3.3)
LYMPH%: 32.1 % (ref 14.0–48.0)
MCH: 29.8 pg (ref 26.0–34.0)
MCHC: 33.7 g/dL (ref 32.0–36.0)
MCV: 88 fL (ref 81–101)
MONO#: 0.7 10*3/uL (ref 0.1–0.9)
MONO%: 10.3 % (ref 0.0–13.0)
NEUT%: 52.8 % (ref 39.6–80.0)
NEUTROS ABS: 3.3 10*3/uL (ref 1.5–6.5)
PLATELETS: 197 10*3/uL (ref 145–400)
RBC: 4.47 10*6/uL (ref 3.70–5.32)
RDW: 14.3 % (ref 11.1–15.7)
WBC: 6.3 10*3/uL (ref 3.9–10.0)

## 2014-10-05 LAB — COMPREHENSIVE METABOLIC PANEL
ALBUMIN: 3.9 g/dL (ref 3.5–5.2)
ALT: 13 U/L (ref 0–35)
AST: 14 U/L (ref 0–37)
Alkaline Phosphatase: 53 U/L (ref 39–117)
BUN: 20 mg/dL (ref 6–23)
CALCIUM: 9.4 mg/dL (ref 8.4–10.5)
CHLORIDE: 108 meq/L (ref 96–112)
CO2: 25 mEq/L (ref 19–32)
CREATININE: 0.8 mg/dL (ref 0.50–1.10)
GLUCOSE: 108 mg/dL — AB (ref 70–99)
Potassium: 4.4 mEq/L (ref 3.5–5.3)
Sodium: 140 mEq/L (ref 135–145)
Total Bilirubin: 0.4 mg/dL (ref 0.2–1.2)
Total Protein: 6.6 g/dL (ref 6.0–8.3)

## 2014-10-05 LAB — D-DIMER, QUANTITATIVE: D-Dimer, Quant: 0.93 ug/mL-FEU — ABNORMAL HIGH (ref 0.00–0.48)

## 2014-10-05 NOTE — Progress Notes (Signed)
Hematology and Oncology Follow Up Visit  BROOKLYNE CORTER [id number] Oct 21, 1945 69 y.o. 10/05/2014   Principle Diagnosis:  Idiopathic portal vein thrombosis  Current Therapy:   Aspirin 162 mg by mouth daily    Interim History:  Ms. Buehrer is here today with her grandson for a follow-up. She is doing well and has no complaints at this time. She has been busy tutoring and is preparing to leave for [place] in 2 weeks. She is excited about this trip.  She has had no swelling, tenderness, numbness or tingling in her extremities.  She is eating well and staying hydrated. Her weight is stable.  No fever, rash, SOB, cough, chest pain, palpitations, abdominal pain, changes in bowel or bladder function. No blood in urine or stool.  She plans to wear her compression stocking during her flights and get up to walk around when she can. She understands the importance of staying well hydrated.   Medications:    Medication List       This list is accurate as of: 10/05/14 10:37 AM.  Always use your most recent med list.               aspirin 81 MG tablet  Take 81 mg by mouth daily.     budesonide-formoterol 80-4.5 MCG/ACT inhaler  Commonly known as:  SYMBICORT  Inhale 1 puff into the lungs daily. Uses with seasonal allergies in the Spring and the Fall.     cetirizine 10 MG tablet  Commonly known as:  ZYRTEC  Take 10 mg by mouth daily with breakfast.     enalapril 2.5 MG tablet  Commonly known as:  VASOTEC  Take 1 tablet (2.5 mg total) by mouth daily.     enalapril 2.5 MG tablet  Commonly known as:  VASOTEC  TAKE 1 TABLET BY MOUTH DAILY     EPIPEN 2-PAK 0.3 mg/0.3 mL Soaj injection  Generic drug:  EPINEPHrine     famotidine 20 MG tablet  Commonly known as:  PEPCID  Take 1 tablet (20 mg total) by mouth daily as needed for heartburn or indigestion.     FISH OIL CONCENTRATE 1000 MG Caps  Take by mouth 3 (three) times daily.     fluticasone 50 MCG/ACT nasal spray  Commonly known as:   FLONASE  Place 2 sprays into both nostrils daily.     glucose blood test strip  Commonly known as:  ACCU-CHEK AVIVA PLUS  Use as directed once daily to check blood sugar.  Diagnosis code E11.9     Lancets 30G Misc  Use as directed once daily to check blood sugar.  Diagnosis code E11.9     montelukast 10 MG tablet  Commonly known as:  SINGULAIR  Take 10 mg by mouth at bedtime.     multivitamin with minerals tablet  Take 1 tablet by mouth daily.     PROVENTIL HFA 108 (90 BASE) MCG/ACT inhaler  Generic drug:  albuterol        Allergies:  Allergies  Allergen Reactions  . Codeine Other (See Comments)    "makes me crazy"  . Sulfa Antibiotics Anaphylaxis    "died 3 times".  . Oxycodone     Past Medical History, Surgical history, Social history, and Family History were reviewed and updated.  Review of Systems: All other 10 point review of systems is negative.   Physical Exam:  vitals were not taken for this visit.  Wt Readings from Last 3 Encounters:  08/06/14 196  lb (88.905 kg)  06/08/14 196 lb (88.905 kg)  02/06/14 195 lb 6.4 oz (88.633 kg)    Ocular: Sclerae unicteric, pupils equal, round and reactive to light Ear-nose-throat: Oropharynx clear, dentition fair Lymphatic: No cervical or supraclavicular adenopathy Lungs no rales or rhonchi, good excursion bilaterally Heart regular rate and rhythm, no murmur appreciated Abd soft, nontender, positive bowel sounds MSK no focal spinal tenderness, no joint edema Neuro: non-focal, well-oriented, appropriate affect Breasts: Deferred  Lab Results  Component Value Date   WBC 6.1 08/06/2014   HGB 13.5 08/06/2014   HCT 41.2 08/06/2014   MCV 88.0 08/06/2014   PLT 184.0 08/06/2014   Lab Results  Component Value Date   FERRITIN 587* 10/10/2010   IRON 22* 10/10/2010   TIBC 129* 10/10/2010   UIBC 107 10/10/2010   IRONPCTSAT 17* 10/10/2010   Lab Results  Component Value Date   RBC 4.68 08/06/2014   No results found  for: KPAFRELGTCHN, LAMBDASER, KAPLAMBRATIO No results found for: Loel Lofty, IGMSERUM No results found for: Marda Stalker, SPEI   Chemistry      Component Value Date/Time   NA 138 08/06/2014 1220   NA 142 06/08/2014 0958   K 4.3 08/06/2014 1220   K 4.2 06/08/2014 0958   CL 106 08/06/2014 1220   CL 104 06/08/2014 0958   CO2 27 08/06/2014 1220   CO2 26 06/08/2014 0958   BUN 19 08/06/2014 1220   BUN 17 06/08/2014 0958   CREATININE 0.69 08/06/2014 1220   CREATININE 0.7 06/08/2014 0958      Component Value Date/Time   CALCIUM 9.6 08/06/2014 1220   CALCIUM 9.3 06/08/2014 0958   ALKPHOS 61 08/06/2014 1220   ALKPHOS 57 06/08/2014 0958   AST 15 08/06/2014 1220   AST 16 06/08/2014 0958   ALT 16 08/06/2014 1220   ALT 14 06/08/2014 0958   BILITOT 0.4 08/06/2014 1220   BILITOT 0.70 06/08/2014 0958     Impression and Plan: Ms. Orren is 69 year old female with history of an idiopathic portal vein thrombus. She was treated with anticoagulation with Coumadin for 1 year. She is now on 2 baby aspirin daily and doing well. She has not had a recurrent clot. She is asymptomatic at this time.  She will continue taking her same dose of aspirin.  We will plan to see her back in 4 months after she returns from [place].  She knows to call with any questions or concerns. We can certainly see her sooner if need be.   Verdie Mosher, NP 6/24/201610:37 AM

## 2014-10-10 ENCOUNTER — Other Ambulatory Visit: Payer: Self-pay | Admitting: Family Medicine

## 2014-10-17 ENCOUNTER — Telehealth: Payer: Self-pay | Admitting: Family Medicine

## 2014-10-17 NOTE — Telephone Encounter (Signed)
Error

## 2014-11-20 ENCOUNTER — Other Ambulatory Visit: Payer: Self-pay | Admitting: Family Medicine

## 2014-11-27 ENCOUNTER — Ambulatory Visit (INDEPENDENT_AMBULATORY_CARE_PROVIDER_SITE_OTHER): Payer: Medicare Other | Admitting: Family Medicine

## 2014-11-27 ENCOUNTER — Encounter: Payer: Self-pay | Admitting: Family Medicine

## 2014-11-27 VITALS — BP 124/59 | HR 74 | Temp 98.7°F | Ht 66.0 in | Wt 198.1 lb

## 2014-11-27 DIAGNOSIS — K219 Gastro-esophageal reflux disease without esophagitis: Secondary | ICD-10-CM

## 2014-11-27 DIAGNOSIS — E785 Hyperlipidemia, unspecified: Secondary | ICD-10-CM

## 2014-11-27 DIAGNOSIS — E1169 Type 2 diabetes mellitus with other specified complication: Secondary | ICD-10-CM

## 2014-11-27 DIAGNOSIS — I10 Essential (primary) hypertension: Secondary | ICD-10-CM | POA: Diagnosis not present

## 2014-11-27 DIAGNOSIS — E669 Obesity, unspecified: Secondary | ICD-10-CM

## 2014-11-27 DIAGNOSIS — E119 Type 2 diabetes mellitus without complications: Secondary | ICD-10-CM | POA: Diagnosis not present

## 2014-11-27 DIAGNOSIS — L309 Dermatitis, unspecified: Secondary | ICD-10-CM

## 2014-11-27 MED ORDER — TRIAMCINOLONE ACETONIDE 0.1 % EX CREA: 1.0000 "application " | TOPICAL_CREAM | Freq: Two times a day (BID) | CUTANEOUS | Status: DC

## 2014-11-27 NOTE — Patient Instructions (Signed)

## 2014-11-27 NOTE — Progress Notes (Signed)
Pre visit review using our clinic review tool, if applicable. No additional management support is needed unless otherwise documented below in the visit note. 

## 2014-11-28 ENCOUNTER — Telehealth: Payer: Self-pay | Admitting: Family Medicine

## 2014-11-28 DIAGNOSIS — L309 Dermatitis, unspecified: Secondary | ICD-10-CM

## 2014-11-28 MED ORDER — TRIAMCINOLONE ACETONIDE 0.1 % EX CREA: 1.0000 "application " | TOPICAL_CREAM | Freq: Two times a day (BID) | CUTANEOUS | Status: DC

## 2014-11-28 NOTE — Telephone Encounter (Signed)
Caller name: Lossie Kalp Relationship to patient: Self  Can be reached: [phone] Pharmacy:  Reason for call: pt says that she received a call from you. She is returning your call.

## 2014-11-28 NOTE — Telephone Encounter (Signed)
Received call from [facility] on [address]. Pt there now to pick up triamcinolone cream (KENALOG) 0.1 % that was sent to [facility]. Please resend.

## 2014-11-28 NOTE — Telephone Encounter (Signed)
Medication sent to [facility] msg left telling pt medication was switched

## 2014-12-09 NOTE — Assessment & Plan Note (Signed)
hgba1c acceptable, minimize simple carbs. Increase exercise as tolerated. Continue current meds 

## 2014-12-09 NOTE — Assessment & Plan Note (Signed)
Well controlled, no changes to meds. Encouraged heart healthy diet such as the DASH diet and exercise as tolerated.  °

## 2014-12-09 NOTE — Progress Notes (Signed)
Subjective:    Patient ID: Vicki Dennis, female    DOB: 11-20-1945, 69 y.o.   MRN: [id number]  Chief Complaint  Patient presents with  . Follow-up    HPI Patient is in today for follow up on numerous conditions, is doing well. No recent illness or acute concerns. Is trying to maintain a heart healthy diet and minimize simple carbohydrate use. Denies CP/palp/SOB/HA/congestion/fevers/GI or GU c/o. Taking meds as prescribed  Past Medical History  Diagnosis Date  . Hyperlipidemia   . Hypertension   . Portal vein thrombosis 03/09/2011    coumadin ended summer of 2013  . Asthma   . Blood transfusion without reported diagnosis   . GERD (gastroesophageal reflux disease)     rare use of OTC aid  . Anemia     h/o  . Pneumonia     as a child  . Diabetes mellitus     type-II, per Dr. Parke Simmers, no meds. , pt. reports weight loss has contributed  to control of diabetes.  She reports last HgbA1c- wnl.  . Diabetes mellitus 11/05/2010  . Chicken pox as a child  . Measles as a child  . Mumps as a child  . Diabetes mellitus type 2, uncontrolled 11/05/2010  . Allergy   . Diabetes mellitus type 2 in obese 11/05/2010  . Obesity 02/10/2014  . Esophageal reflux 02/10/2014  . Seborrheic keratosis 02/10/2014    Follows with dermatology  . Asthma, mild intermittent, well-controlled 02/10/2014  . Medicare annual wellness visit, subsequent 08/06/2014  . Preventative health care 08/12/2014    Past Surgical History  Procedure Laterality Date  . Knee arthroscopy w/ meniscal repair      both knees  . Tonsillectomy    . Umbilical hernia repair  03/16/2012    Procedure: HERNIA REPAIR UMBILICAL ADULT;  Surgeon: Shelly Rubenstein, MD;  Location: [facility] OR;  Service: General;  Laterality: N/A;  . Insertion of mesh  03/16/2012    Procedure: INSERTION OF MESH;  Surgeon: Shelly Rubenstein, MD;  Location: [facility] OR;  Service: General;  Laterality: N/A;  . Breast surgery      benign - cysts    Family History    Problem Relation Age of Onset  . Cancer Father     prostate  . Cancer Maternal Aunt   . Aneurysm Mother 46    brain    Social History   Social History  . Marital Status: Divorced    Spouse Name: N/A  . Number of Children: N/A  . Years of Education: N/A   Occupational History  . Not on file.   Social History Main Topics  . Smoking status: Never Smoker   . Smokeless tobacco: Never Used     Comment: never used tobacco  . Alcohol Use: 0.0 oz/week    0 Standard drinks or equivalent per week     Comment: for holidays  . Drug Use: No  . Sexual Activity: No     Comment: lives alone    Other Topics Concern  . Not on file   Social History Narrative    Outpatient Prescriptions Prior to Visit  Medication Sig Dispense Refill  . amoxicillin (AMOXIL) 500 MG capsule     . aspirin 81 MG tablet Take 81 mg by mouth daily.     Marland Kitchen atovaquone-proguanil (MALARONE) 250-100 MG TABS     . budesonide-formoterol (SYMBICORT) 80-4.5 MCG/ACT inhaler Inhale 1 puff into the lungs daily. Uses with seasonal allergies in the  Spring and the Fall.    . cetirizine (ZYRTEC) 10 MG tablet Take 10 mg by mouth daily with breakfast.     . enalapril (VASOTEC) 2.5 MG tablet Take 1 tablet (2.5 mg total) by mouth daily. 30 tablet 2  . EPIPEN 2-PAK 0.3 MG/0.3ML SOAJ injection     . famotidine (PEPCID) 20 MG tablet Take 1 tablet (20 mg total) by mouth daily as needed for heartburn or indigestion.    . fluticasone (FLONASE) 50 MCG/ACT nasal spray Place 2 sprays into both nostrils daily. 16 g 6  . glucose blood (ACCU-CHEK AVIVA PLUS) test strip Use as directed once daily to check blood sugar.  Diagnosis code E11.9 100 each 5  . Lancets 30G MISC Use as directed once daily to check blood sugar.  Diagnosis code E11.9 100 each 5  . montelukast (SINGULAIR) 10 MG tablet Take 10 mg by mouth at bedtime.     . Multiple Vitamins-Minerals (MULTIVITAMIN WITH MINERALS) tablet Take 1 tablet by mouth daily.      . Omega-3 Fatty  Acids (FISH OIL CONCENTRATE) 1000 MG CAPS Take by mouth 3 (three) times daily.    Marland Kitchen PROVENTIL HFA 108 (90 BASE) MCG/ACT inhaler     . ciprofloxacin (CIPRO) 500 MG tablet     . enalapril (VASOTEC) 2.5 MG tablet TAKE 1 TABLET BY MOUTH DAILY 30 tablet 4   No facility-administered medications prior to visit.    Allergies  Allergen Reactions  . Codeine Other (See Comments)    "makes me crazy"  . Sulfa Antibiotics Anaphylaxis    "died 3 times".  . Oxycodone     Review of Systems  Constitutional: Negative for fever and malaise/fatigue.  HENT: Negative for congestion.   Eyes: Negative for discharge.  Respiratory: Negative for shortness of breath.   Cardiovascular: Negative for chest pain, palpitations and leg swelling.  Gastrointestinal: Negative for nausea and abdominal pain.  Genitourinary: Negative for dysuria.  Musculoskeletal: Negative for falls.  Skin: Negative for rash.  Neurological: Negative for loss of consciousness and headaches.  Endo/Heme/Allergies: Negative for environmental allergies.  Psychiatric/Behavioral: Negative for depression. The patient is not nervous/anxious.        Objective:    Physical Exam  Constitutional: She is oriented to person, place, and time. She appears well-developed and well-nourished. No distress.  HENT:  Head: Normocephalic and atraumatic.  Nose: Nose normal.  Eyes: Right eye exhibits no discharge. Left eye exhibits no discharge.  Neck: Normal range of motion. Neck supple.  Cardiovascular: Normal rate and regular rhythm.   No murmur heard. Pulmonary/Chest: Effort normal and breath sounds normal.  Abdominal: Soft. Bowel sounds are normal. There is no tenderness.  Musculoskeletal: She exhibits no edema.  Neurological: She is alert and oriented to person, place, and time.  Skin: Skin is warm and dry.  Psychiatric: She has a normal mood and affect.  Nursing note and vitals reviewed.   BP 124/59 mmHg  Pulse 74  Temp(Src) 98.7 F (37.1  C) (Oral)  Ht 5\' 6"  (1.676 m)  Wt 198 lb 2 oz (89.869 kg)  BMI 31.99 kg/m2  SpO2 100% Wt Readings from Last 3 Encounters:  11/27/14 198 lb 2 oz (89.869 kg)  10/05/14 199 lb (90.266 kg)  08/06/14 196 lb (88.905 kg)     Lab Results  Component Value Date   WBC 6.3 10/05/2014   HGB 13.3 10/05/2014   HCT 39.5 10/05/2014   PLT 197 10/05/2014   GLUCOSE 108* 10/05/2014   CHOL  226* 08/06/2014   TRIG 117.0 08/06/2014   HDL 65.00 08/06/2014   LDLDIRECT 111.2 02/06/2014   LDLCALC 138* 08/06/2014   ALT 13 10/05/2014   AST 14 10/05/2014   NA 140 10/05/2014   K 4.4 10/05/2014   CL 108 10/05/2014   CREATININE 0.80 10/05/2014   BUN 20 10/05/2014   CO2 25 10/05/2014   TSH 0.80 08/06/2014   INR 2.2 09/21/2011   HGBA1C 6.6* 08/06/2014   MICROALBUR 2.2* 02/06/2014    Lab Results  Component Value Date   TSH 0.80 08/06/2014   Lab Results  Component Value Date   WBC 6.3 10/05/2014   HGB 13.3 10/05/2014   HCT 39.5 10/05/2014   MCV 88 10/05/2014   PLT 197 10/05/2014   Lab Results  Component Value Date   NA 140 10/05/2014   K 4.4 10/05/2014   CO2 25 10/05/2014   GLUCOSE 108* 10/05/2014   BUN 20 10/05/2014   CREATININE 0.80 10/05/2014   BILITOT 0.4 10/05/2014   ALKPHOS 53 10/05/2014   AST 14 10/05/2014   ALT 13 10/05/2014   PROT 6.6 10/05/2014   ALBUMIN 3.9 10/05/2014   CALCIUM 9.4 10/05/2014   GFR 107.80 08/06/2014   Lab Results  Component Value Date   CHOL 226* 08/06/2014   Lab Results  Component Value Date   HDL 65.00 08/06/2014   Lab Results  Component Value Date   LDLCALC 138* 08/06/2014   Lab Results  Component Value Date   TRIG 117.0 08/06/2014   Lab Results  Component Value Date   CHOLHDL 3 08/06/2014   Lab Results  Component Value Date   HGBA1C 6.6* 08/06/2014       Assessment & Plan:   Problem List Items Addressed This Visit    Obesity    Encouraged DASH diet, decrease po intake and increase exercise as tolerated. Needs 7-8 hours of  sleep nightly. Avoid trans fats, eat small, frequent meals every 4-5 hours with lean proteins, complex carbs and healthy fats. Minimize simple carbs, GMO foods.      Relevant Orders   TSH   CBC   Hemoglobin A1c   Lipid panel   Comprehensive metabolic panel   Hyperlipidemia    Encouraged heart healthy diet, increase exercise, avoid trans fats, consider a krill oil cap daily      Relevant Orders   TSH   CBC   Hemoglobin A1c   Lipid panel   Comprehensive metabolic panel   HTN (hypertension)    Well controlled, no changes to meds. Encouraged heart healthy diet such as the DASH diet and exercise as tolerated.       Relevant Orders   TSH   CBC   Hemoglobin A1c   Lipid panel   Comprehensive metabolic panel   Esophageal reflux    Avoid offending foods, start probiotics. Do not eat large meals in late evening and consider raising head of bed.       Diabetes mellitus type 2 in obese    hgba1c acceptable, minimize simple carbs. Increase exercise as tolerated. Continue current meds      Relevant Orders   TSH   CBC   Hemoglobin A1c   Lipid panel   Comprehensive metabolic panel    Other Visit Diagnoses    Dermatitis    -  Primary    Relevant Orders    TSH    CBC    Hemoglobin A1c    Lipid panel    Comprehensive metabolic panel  I am having Vicki Dennis maintain her budesonide-formoterol, multivitamin with minerals, cetirizine, aspirin, montelukast, PROVENTIL HFA, FISH OIL CONCENTRATE, fluticasone, enalapril, Lancets 30G, glucose blood, EPIPEN 2-PAK, famotidine, ciprofloxacin, atovaquone-proguanil, and amoxicillin.  Meds ordered this encounter  Medications  . DISCONTD: triamcinolone cream (KENALOG) 0.1 %    Sig: Apply 1 application topically 2 (two) times daily.    Dispense:  80 g    Refill:  0     Danise Edge, MD

## 2014-12-09 NOTE — Assessment & Plan Note (Signed)
Avoid offending foods, start probiotics. Do not eat large meals in late evening and consider raising head of bed.  

## 2014-12-09 NOTE — Assessment & Plan Note (Signed)
Encouraged DASH diet, decrease po intake and increase exercise as tolerated. Needs 7-8 hours of sleep nightly. Avoid trans fats, eat small, frequent meals every 4-5 hours with lean proteins, complex carbs and healthy fats. Minimize simple carbs, GMO foods. 

## 2014-12-09 NOTE — Assessment & Plan Note (Signed)
Encouraged heart healthy diet, increase exercise, avoid trans fats, consider a krill oil cap daily 

## 2014-12-31 ENCOUNTER — Other Ambulatory Visit (INDEPENDENT_AMBULATORY_CARE_PROVIDER_SITE_OTHER): Payer: Medicare Other

## 2014-12-31 DIAGNOSIS — I10 Essential (primary) hypertension: Secondary | ICD-10-CM | POA: Diagnosis not present

## 2014-12-31 DIAGNOSIS — E785 Hyperlipidemia, unspecified: Secondary | ICD-10-CM | POA: Diagnosis not present

## 2014-12-31 DIAGNOSIS — E669 Obesity, unspecified: Secondary | ICD-10-CM

## 2014-12-31 DIAGNOSIS — E1169 Type 2 diabetes mellitus with other specified complication: Secondary | ICD-10-CM

## 2014-12-31 DIAGNOSIS — E119 Type 2 diabetes mellitus without complications: Secondary | ICD-10-CM

## 2014-12-31 DIAGNOSIS — L309 Dermatitis, unspecified: Secondary | ICD-10-CM

## 2014-12-31 LAB — LIPID PANEL
CHOLESTEROL: 210 mg/dL — AB (ref 0–200)
HDL: 64.5 mg/dL (ref >39.00)
LDL Cholesterol: 128 mg/dL — ABNORMAL HIGH (ref 0–99)
NonHDL: 145.85
TRIGLYCERIDES: 87 mg/dL (ref 0.0–149.0)
Total CHOL/HDL Ratio: 3
VLDL: 17.4 mg/dL (ref 0.0–40.0)

## 2014-12-31 LAB — COMPREHENSIVE METABOLIC PANEL
ALK PHOS: 58 U/L (ref 39–117)
ALT: 14 U/L (ref 0–35)
AST: 14 U/L (ref 0–37)
Albumin: 4 g/dL (ref 3.5–5.2)
BUN: 22 mg/dL (ref 6–23)
CALCIUM: 9.3 mg/dL (ref 8.4–10.5)
CO2: 25 mEq/L (ref 19–32)
Chloride: 109 mEq/L (ref 96–112)
Creatinine, Ser: 0.69 mg/dL (ref 0.40–1.20)
GFR: 107.68 mL/min (ref >60.00)
Glucose, Bld: 115 mg/dL — ABNORMAL HIGH (ref 70–99)
POTASSIUM: 4.1 meq/L (ref 3.5–5.1)
Sodium: 141 mEq/L (ref 135–145)
TOTAL PROTEIN: 6.7 g/dL (ref 6.0–8.3)
Total Bilirubin: 0.4 mg/dL (ref 0.2–1.2)

## 2014-12-31 LAB — CBC
HCT: 41.3 % (ref 36.0–46.0)
Hemoglobin: 13.5 g/dL (ref 12.0–15.0)
MCHC: 32.8 g/dL (ref 30.0–36.0)
MCV: 87.9 fl (ref 78.0–100.0)
Platelets: 178 10*3/uL (ref 150.0–400.0)
RBC: 4.7 Mil/uL (ref 3.87–5.11)
RDW: 14.8 % (ref 11.5–15.5)
WBC: 6.4 10*3/uL (ref 4.0–10.5)

## 2014-12-31 LAB — HEMOGLOBIN A1C: HEMOGLOBIN A1C: 6.9 % — AB (ref 4.6–6.5)

## 2014-12-31 LAB — TSH: TSH: 0.91 u[IU]/mL (ref 0.35–4.50)

## 2015-01-07 ENCOUNTER — Ambulatory Visit (INDEPENDENT_AMBULATORY_CARE_PROVIDER_SITE_OTHER): Payer: Medicare Other | Admitting: Family Medicine

## 2015-01-07 ENCOUNTER — Encounter: Payer: Self-pay | Admitting: Family Medicine

## 2015-01-07 VITALS — BP 136/80 | HR 79 | Temp 98.2°F | Ht 66.0 in | Wt 201.5 lb

## 2015-01-07 DIAGNOSIS — E669 Obesity, unspecified: Secondary | ICD-10-CM

## 2015-01-07 DIAGNOSIS — Z23 Encounter for immunization: Secondary | ICD-10-CM

## 2015-01-07 DIAGNOSIS — L708 Other acne: Secondary | ICD-10-CM | POA: Insufficient documentation

## 2015-01-07 DIAGNOSIS — I10 Essential (primary) hypertension: Secondary | ICD-10-CM

## 2015-01-07 DIAGNOSIS — R21 Rash and other nonspecific skin eruption: Secondary | ICD-10-CM

## 2015-01-07 DIAGNOSIS — E1169 Type 2 diabetes mellitus with other specified complication: Secondary | ICD-10-CM

## 2015-01-07 DIAGNOSIS — K219 Gastro-esophageal reflux disease without esophagitis: Secondary | ICD-10-CM

## 2015-01-07 DIAGNOSIS — L821 Other seborrheic keratosis: Secondary | ICD-10-CM

## 2015-01-07 DIAGNOSIS — E785 Hyperlipidemia, unspecified: Secondary | ICD-10-CM

## 2015-01-07 DIAGNOSIS — E119 Type 2 diabetes mellitus without complications: Secondary | ICD-10-CM

## 2015-01-07 HISTORY — DX: Rash and other nonspecific skin eruption: R21

## 2015-01-07 NOTE — Assessment & Plan Note (Signed)
Well controlled, no changes to meds. Encouraged heart healthy diet such as the DASH diet and exercise as tolerated.  °

## 2015-01-07 NOTE — Progress Notes (Signed)
Pre visit review using our clinic review tool, if applicable. No additional management support is needed unless otherwise documented below in the visit note. 

## 2015-01-07 NOTE — Assessment & Plan Note (Signed)
Resolved on left hand. Dermatitis vs cellulitis, likely cellulitis, no further treatment at this time

## 2015-01-07 NOTE — Assessment & Plan Note (Signed)
Encouraged heart healthy diet, increase exercise, avoid trans fats, consider a krill oil cap daily 

## 2015-01-07 NOTE — Patient Instructions (Signed)

## 2015-01-07 NOTE — Assessment & Plan Note (Signed)
Avoid offending foods, start probiotics. Do not eat large meals in late evening and consider raising head of bed.  

## 2015-01-07 NOTE — Assessment & Plan Note (Signed)
hgba1c acceptable, minimize simple carbs. Increase exercise as tolerated. Continue current meds 

## 2015-01-07 NOTE — Progress Notes (Signed)
Subjective:    Patient ID: Vicki Dennis, female    DOB: 10-15-1945, 69 y.o.   MRN: [id number]  Chief Complaint  Patient presents with  . Follow-up    HPI Patient is in today for follow-up on numerous concerns.  No rash, possible cellulitis on her left hand is resolved. She feels well. No rash or other recent illness. Denies polyuria or polydipsia. Denies CP/palp/SOB/HA/congestion/fevers/GI or GU c/o. Taking meds as prescribed  Past Medical History  Diagnosis Date  . Hyperlipidemia   . Hypertension   . Portal vein thrombosis 03/09/2011    coumadin ended summer of 2013  . Asthma   . Blood transfusion without reported diagnosis   . GERD (gastroesophageal reflux disease)     rare use of OTC aid  . Anemia     h/o  . Pneumonia     as a child  . Diabetes mellitus     type-II, per Dr. Parke Simmers, no meds. , pt. reports weight loss has contributed  to control of diabetes.  She reports last HgbA1c- wnl.  . Diabetes mellitus 11/05/2010  . Chicken pox as a child  . Measles as a child  . Mumps as a child  . Diabetes mellitus type 2, uncontrolled 11/05/2010  . Allergy   . Diabetes mellitus type 2 in obese 11/05/2010  . Obesity 02/10/2014  . Esophageal reflux 02/10/2014  . Seborrheic keratosis 02/10/2014    Follows with dermatology  . Asthma, mild intermittent, well-controlled 02/10/2014  . Medicare annual wellness visit, subsequent 08/06/2014  . Preventative health care 08/12/2014  . Rash and nonspecific skin eruption 01/07/2015    Past Surgical History  Procedure Laterality Date  . Knee arthroscopy w/ meniscal repair      both knees  . Tonsillectomy    . Umbilical hernia repair  03/16/2012    Procedure: HERNIA REPAIR UMBILICAL ADULT;  Surgeon: Shelly Rubenstein, MD;  Location: [facility] OR;  Service: General;  Laterality: N/A;  . Insertion of mesh  03/16/2012    Procedure: INSERTION OF MESH;  Surgeon: Shelly Rubenstein, MD;  Location: [facility] OR;  Service: General;  Laterality: N/A;  . Breast  surgery      benign - cysts    Family History  Problem Relation Age of Onset  . Cancer Father     prostate  . Cancer Maternal Aunt   . Aneurysm Mother 51    brain    Social History   Social History  . Marital Status: Divorced    Spouse Name: N/A  . Number of Children: N/A  . Years of Education: N/A   Occupational History  . Not on file.   Social History Main Topics  . Smoking status: Never Smoker   . Smokeless tobacco: Never Used     Comment: never used tobacco  . Alcohol Use: 0.0 oz/week    0 Standard drinks or equivalent per week     Comment: for holidays  . Drug Use: No  . Sexual Activity: No     Comment: lives alone    Other Topics Concern  . Not on file   Social History Narrative    Outpatient Prescriptions Prior to Visit  Medication Sig Dispense Refill  . aspirin 81 MG tablet Take 81 mg by mouth daily.     . budesonide-formoterol (SYMBICORT) 80-4.5 MCG/ACT inhaler Inhale 1 puff into the lungs daily. Uses with seasonal allergies in the Spring and the Fall.    . cetirizine (ZYRTEC) 10 MG  tablet Take 10 mg by mouth daily with breakfast.     . enalapril (VASOTEC) 2.5 MG tablet Take 1 tablet (2.5 mg total) by mouth daily. 30 tablet 2  . EPIPEN 2-PAK 0.3 MG/0.3ML SOAJ injection     . fluticasone (FLONASE) 50 MCG/ACT nasal spray Place 2 sprays into both nostrils daily. 16 g 6  . glucose blood (ACCU-CHEK AVIVA PLUS) test strip Use as directed once daily to check blood sugar.  Diagnosis code E11.9 100 each 5  . Lancets 30G MISC Use as directed once daily to check blood sugar.  Diagnosis code E11.9 100 each 5  . montelukast (SINGULAIR) 10 MG tablet Take 10 mg by mouth at bedtime.     . Multiple Vitamins-Minerals (MULTIVITAMIN WITH MINERALS) tablet Take 1 tablet by mouth daily.      . Omega-3 Fatty Acids (FISH OIL CONCENTRATE) 1000 MG CAPS Take by mouth 3 (three) times daily.    Marland Kitchen PROVENTIL HFA 108 (90 BASE) MCG/ACT inhaler     . amoxicillin (AMOXIL) 500 MG capsule      . atovaquone-proguanil (MALARONE) 250-100 MG TABS     . ciprofloxacin (CIPRO) 500 MG tablet     . famotidine (PEPCID) 20 MG tablet Take 1 tablet (20 mg total) by mouth daily as needed for heartburn or indigestion.    . triamcinolone cream (KENALOG) 0.1 % Apply 1 application topically 2 (two) times daily. 80 g 0   No facility-administered medications prior to visit.    Allergies  Allergen Reactions  . Codeine Other (See Comments)    "makes me crazy"  . Sulfa Antibiotics Anaphylaxis    "died 3 times".  . Oxycodone     Review of Systems  Constitutional: Negative for fever, chills and malaise/fatigue.  HENT: Negative for congestion and hearing loss.   Eyes: Negative for discharge.  Respiratory: Negative for cough, sputum production and shortness of breath.   Cardiovascular: Negative for chest pain, palpitations and leg swelling.  Gastrointestinal: Negative for heartburn, nausea, vomiting, abdominal pain, diarrhea, constipation and blood in stool.  Genitourinary: Negative for dysuria, urgency, frequency and hematuria.  Musculoskeletal: Negative for myalgias, back pain and falls.  Skin: Negative for rash.       Rash improved on hand  Neurological: Negative for dizziness, sensory change, loss of consciousness, weakness and headaches.  Endo/Heme/Allergies: Negative for environmental allergies. Does not bruise/bleed easily.  Psychiatric/Behavioral: Negative for depression and suicidal ideas. The patient is not nervous/anxious and does not have insomnia.        Objective:    Physical Exam  Constitutional: She is oriented to person, place, and time. She appears well-developed and well-nourished. No distress.  HENT:  Head: Normocephalic and atraumatic.  Nose: Nose normal.  Eyes: Right eye exhibits no discharge. Left eye exhibits no discharge.  Neck: Normal range of motion. Neck supple.  Cardiovascular: Normal rate and regular rhythm.   No murmur heard. Pulmonary/Chest: Effort  normal and breath sounds normal.  Abdominal: Soft. Bowel sounds are normal. There is no tenderness.  Musculoskeletal: She exhibits no edema.  Neurological: She is alert and oriented to person, place, and time.  Skin: Skin is warm and dry.  Psychiatric: She has a normal mood and affect.  Nursing note and vitals reviewed.   BP 136/80 mmHg  Pulse 79  Temp(Src) 98.2 F (36.8 C) (Oral)  Ht 5\' 6"  (1.676 m)  Wt 201 lb 8 oz (91.4 kg)  BMI 32.54 kg/m2  SpO2 99% Wt Readings from Last 3  Encounters:  01/07/15 201 lb 8 oz (91.4 kg)  11/27/14 198 lb 2 oz (89.869 kg)  10/05/14 199 lb (90.266 kg)     Lab Results  Component Value Date   WBC 6.4 12/31/2014   HGB 13.5 12/31/2014   HCT 41.3 12/31/2014   PLT 178.0 12/31/2014   GLUCOSE 115* 12/31/2014   CHOL 210* 12/31/2014   TRIG 87.0 12/31/2014   HDL 64.50 12/31/2014   LDLDIRECT 111.2 02/06/2014   LDLCALC 128* 12/31/2014   ALT 14 12/31/2014   AST 14 12/31/2014   NA 141 12/31/2014   K 4.1 12/31/2014   CL 109 12/31/2014   CREATININE 0.69 12/31/2014   BUN 22 12/31/2014   CO2 25 12/31/2014   TSH 0.91 12/31/2014   INR 2.2 09/21/2011   HGBA1C 6.9* 12/31/2014   MICROALBUR 2.2* 02/06/2014    Lab Results  Component Value Date   TSH 0.91 12/31/2014   Lab Results  Component Value Date   WBC 6.4 12/31/2014   HGB 13.5 12/31/2014   HCT 41.3 12/31/2014   MCV 87.9 12/31/2014   PLT 178.0 12/31/2014   Lab Results  Component Value Date   NA 141 12/31/2014   K 4.1 12/31/2014   CO2 25 12/31/2014   GLUCOSE 115* 12/31/2014   BUN 22 12/31/2014   CREATININE 0.69 12/31/2014   BILITOT 0.4 12/31/2014   ALKPHOS 58 12/31/2014   AST 14 12/31/2014   ALT 14 12/31/2014   PROT 6.7 12/31/2014   ALBUMIN 4.0 12/31/2014   CALCIUM 9.3 12/31/2014   GFR 107.68 12/31/2014   Lab Results  Component Value Date   CHOL 210* 12/31/2014   Lab Results  Component Value Date   HDL 64.50 12/31/2014   Lab Results  Component Value Date   LDLCALC 128*  12/31/2014   Lab Results  Component Value Date   TRIG 87.0 12/31/2014   Lab Results  Component Value Date   CHOLHDL 3 12/31/2014   Lab Results  Component Value Date   HGBA1C 6.9* 12/31/2014       Assessment & Plan:   Problem List Items Addressed This Visit    Seborrheic keratosis   Rash and nonspecific skin eruption    Resolved on left hand. Dermatitis vs cellulitis, likely cellulitis, no further treatment at this time      Hyperlipidemia    Encouraged heart healthy diet, increase exercise, avoid trans fats, consider a krill oil cap daily      HTN (hypertension)    Well controlled, no changes to meds. Encouraged heart healthy diet such as the DASH diet and exercise as tolerated.       Esophageal reflux    Avoid offending foods, start probiotics. Do not eat large meals in late evening and consider raising head of bed.       Diabetes mellitus type 2 in obese    hgba1c acceptable, minimize simple carbs. Increase exercise as tolerated. Continue current meds       Other Visit Diagnoses    Encounter for immunization    -  Primary       I have discontinued Ms. Alarie's famotidine, ciprofloxacin, atovaquone-proguanil, amoxicillin, and triamcinolone cream. I am also having her maintain her budesonide-formoterol, multivitamin with minerals, cetirizine, aspirin, montelukast, PROVENTIL HFA, FISH OIL CONCENTRATE, fluticasone, enalapril, Lancets 30G, glucose blood, and EPIPEN 2-PAK.  No orders of the defined types were placed in this encounter.     Danise Edge, MD

## 2015-02-07 ENCOUNTER — Ambulatory Visit: Payer: Medicare Other | Admitting: Hematology & Oncology

## 2015-02-07 ENCOUNTER — Other Ambulatory Visit: Payer: Medicare Other

## 2015-02-11 ENCOUNTER — Ambulatory Visit: Payer: Medicare Other | Admitting: Family Medicine

## 2015-02-22 ENCOUNTER — Ambulatory Visit (HOSPITAL_BASED_OUTPATIENT_CLINIC_OR_DEPARTMENT_OTHER): Payer: Medicare Other | Admitting: Hematology & Oncology

## 2015-02-22 ENCOUNTER — Other Ambulatory Visit (HOSPITAL_BASED_OUTPATIENT_CLINIC_OR_DEPARTMENT_OTHER): Payer: Medicare Other

## 2015-02-22 VITALS — BP 130/71 | HR 71 | Temp 98.0°F | Wt 196.8 lb

## 2015-02-22 DIAGNOSIS — I81 Portal vein thrombosis: Secondary | ICD-10-CM

## 2015-02-22 DIAGNOSIS — Z7901 Long term (current) use of anticoagulants: Secondary | ICD-10-CM

## 2015-02-22 DIAGNOSIS — Z7982 Long term (current) use of aspirin: Secondary | ICD-10-CM | POA: Diagnosis not present

## 2015-02-22 DIAGNOSIS — E669 Obesity, unspecified: Secondary | ICD-10-CM

## 2015-02-22 DIAGNOSIS — E1169 Type 2 diabetes mellitus with other specified complication: Secondary | ICD-10-CM

## 2015-02-22 LAB — CBC WITH DIFFERENTIAL (CANCER CENTER ONLY)
BASO#: 0 10*3/uL (ref 0.0–0.2)
BASO%: 0.6 % (ref 0.0–2.0)
EOS ABS: 0.3 10*3/uL (ref 0.0–0.5)
EOS%: 4.1 % (ref 0.0–7.0)
HCT: 42.7 % (ref 34.8–46.6)
HGB: 14 g/dL (ref 11.6–15.9)
LYMPH#: 1.9 10*3/uL (ref 0.9–3.3)
LYMPH%: 29.6 % (ref 14.0–48.0)
MCH: 28.6 pg (ref 26.0–34.0)
MCHC: 32.8 g/dL (ref 32.0–36.0)
MCV: 87 fL (ref 81–101)
MONO#: 0.6 10*3/uL (ref 0.1–0.9)
MONO%: 9.2 % (ref 0.0–13.0)
NEUT#: 3.6 10*3/uL (ref 1.5–6.5)
NEUT%: 56.5 % (ref 39.6–80.0)
PLATELETS: 197 10*3/uL (ref 145–400)
RBC: 4.9 10*6/uL (ref 3.70–5.32)
RDW: 14.6 % (ref 11.1–15.7)
WBC: 6.4 10*3/uL (ref 3.9–10.0)

## 2015-02-22 LAB — COMPREHENSIVE METABOLIC PANEL (CC13)
ALT: 15 U/L (ref 0–55)
ANION GAP: 9 meq/L (ref 3–11)
AST: 15 U/L (ref 5–34)
Albumin: 3.7 g/dL (ref 3.5–5.0)
Alkaline Phosphatase: 74 U/L (ref 40–150)
BILIRUBIN TOTAL: 0.52 mg/dL (ref 0.20–1.20)
BUN: 20.2 mg/dL (ref 7.0–26.0)
CALCIUM: 9.7 mg/dL (ref 8.4–10.4)
CO2: 23 meq/L (ref 22–29)
CREATININE: 0.9 mg/dL (ref 0.6–1.1)
Chloride: 107 mEq/L (ref 98–109)
EGFR: 79 mL/min/{1.73_m2} — ABNORMAL LOW (ref >90)
Glucose: 112 mg/dl (ref 70–140)
Potassium: 4.3 mEq/L (ref 3.5–5.1)
Sodium: 139 mEq/L (ref 136–145)
TOTAL PROTEIN: 6.8 g/dL (ref 6.4–8.3)

## 2015-02-22 LAB — D-DIMER, QUANTITATIVE (NOT AT ARMC): D DIMER QUANT: 1.11 ug{FEU}/mL — AB (ref 0.00–0.48)

## 2015-02-22 NOTE — Progress Notes (Signed)
.  Hematology and Oncology Follow Up Visit  Vicki Dennis [id number] Feb 26, 1946 69 y.o. 02/22/2015   Principle Diagnosis:   Idiopathic portal vein thrombosis  Current Therapy:    Aspirin 162 mg by mouth daily     Interim History:  [name] is back for followup. She went to [place] for part of the summer. She took her grandson. She really had a good time. She goes [place] every couple years. She talked to me about when she climbed down [place] when she was younger. She remembers it very vividly. She made all the way to [place].  Currently, she is not complaining of any abdominal pain. She's having no nausea or vomiting. She's having no bleeding. She's having no cough or shortness of breath.  There's been no change in bowel or bladder habits. She is looking forward to thanks giving. It sounds like her family is coming over to her house and she will do the cooking. She enjoys this. .  Medications:  Current outpatient prescriptions:  .  aspirin 81 MG tablet, Take 81 mg by mouth daily. , Disp: , Rfl:  .  budesonide-formoterol (SYMBICORT) 80-4.5 MCG/ACT inhaler, Inhale 1 puff into the lungs daily. Uses with seasonal allergies in the Spring and the Fall., Disp: , Rfl:  .  cetirizine (ZYRTEC) 10 MG tablet, Take 10 mg by mouth daily with breakfast. , Disp: , Rfl:  .  enalapril (VASOTEC) 2.5 MG tablet, Take 1 tablet (2.5 mg total) by mouth daily., Disp: 30 tablet, Rfl: 2 .  EPIPEN 2-PAK 0.3 MG/0.3ML SOAJ injection, , Disp: , Rfl:  .  fluticasone (FLONASE) 50 MCG/ACT nasal spray, Place 2 sprays into both nostrils daily., Disp: 16 g, Rfl: 6 .  glucose blood (ACCU-CHEK AVIVA PLUS) test strip, Use as directed once daily to check blood sugar.  Diagnosis code E11.9, Disp: 100 each, Rfl: 5 .  Lancets 30G MISC, Use as directed once daily to check blood sugar.  Diagnosis code E11.9, Disp: 100 each, Rfl: 5 .  montelukast (SINGULAIR) 10 MG tablet, Take 10 mg by mouth at bedtime. , Disp: , Rfl:    .  Multiple Vitamins-Minerals (MULTIVITAMIN WITH MINERALS) tablet, Take 1 tablet by mouth daily.  , Disp: , Rfl:  .  Omega-3 Fatty Acids (FISH OIL CONCENTRATE) 1000 MG CAPS, Take by mouth 3 (three) times daily., Disp: , Rfl:  .  PROVENTIL HFA 108 (90 BASE) MCG/ACT inhaler, , Disp: , Rfl:   Allergies:  Allergies  Allergen Reactions  . Codeine Other (See Comments)    "makes me crazy"  . Sulfa Antibiotics Anaphylaxis    "died 3 times".  . Oxycodone     Past Medical History, Surgical history, Social history, and Family History were reviewed and updated.  Review of Systems: As above  Physical Exam:  weight is 196 lb 12.8 oz (89.268 kg). Her oral temperature is 98 F (36.7 C). Her blood pressure is 130/71 and her pulse is 71.   Well-developed and well-nourished African American female. Head and neck exam shows no ocular or oral lesions. She has no palpable cervical or supraclavicular lymph nodes. Lungs are clear bilaterally. Cardiac exam shows a regular rate and rhythm with no murmurs rubs or bruits. Abdomen is soft. Has good bowel sounds. She does have a small anterior abdominal wall hernia. This is down by the umbilicus. It is not tender. It is reducible. There is no palpable abdominal mass. There is no fluid wave. There is no palpable  liver or spleen tip. Extremities shows no clubbing cyanosis or edema. She has good range of motion of her joints. She has good strength in her extremities Neurological exam shows no focal neurological deficits. Skin exam no rashes ecchymoses or petechia.  Lab Results  Component Value Date   WBC 6.4 02/22/2015   HGB 14.0 02/22/2015   HCT 42.7 02/22/2015   MCV 87 02/22/2015   PLT 197 02/22/2015     Chemistry      Component Value Date/Time   NA 141 12/31/2014 0922   NA 142 06/08/2014 0958   K 4.1 12/31/2014 0922   K 4.2 06/08/2014 0958   CL 109 12/31/2014 0922   CL 104 06/08/2014 0958   CO2 25 12/31/2014 0922   CO2 26 06/08/2014 0958   BUN 22  12/31/2014 0922   BUN 17 06/08/2014 0958   CREATININE 0.69 12/31/2014 0922   CREATININE 0.7 06/08/2014 0958      Component Value Date/Time   CALCIUM 9.3 12/31/2014 0922   CALCIUM 9.3 06/08/2014 0958   ALKPHOS 58 12/31/2014 0922   ALKPHOS 57 06/08/2014 0958   AST 14 12/31/2014 0922   AST 16 06/08/2014 0958   ALT 14 12/31/2014 0922   ALT 14 06/08/2014 0958   BILITOT 0.4 12/31/2014 0922   BILITOT 0.70 06/08/2014 0958         Impression and Plan: Vicki Dennis is 69 year old female with an idiopathic portal vein thrombus. She was treated with anticoagulation with Coumadin. She did well. She was on blood thinner for one year.  We have her on aspirin. She's doing well with the aspirin. I see no problems with continuing her on aspirin.  I'm so glad that she had a great time in [place]. She took her grandson. She really enjoys going over to [place].  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 She really enjoys it.  I'll plan to get her back in about 6 months time.   [name], MD 11/11/201610:14 AM

## 2015-02-26 ENCOUNTER — Other Ambulatory Visit: Payer: Self-pay | Admitting: Family Medicine

## 2015-02-26 MED ORDER — GLUCOSE BLOOD VI STRP: ORAL_STRIP | Status: DC

## 2015-04-24 ENCOUNTER — Other Ambulatory Visit: Payer: Self-pay | Admitting: Neurology

## 2015-04-24 ENCOUNTER — Encounter: Payer: Medicare Other | Admitting: Neurology

## 2015-04-24 MED ORDER — MONTELUKAST SODIUM 10 MG PO TABS: 10.0000 mg | ORAL_TABLET | Freq: Every day | ORAL | Status: DC

## 2015-04-24 NOTE — Progress Notes (Signed)
This encounter was created in error - please disregard.

## 2015-04-25 ENCOUNTER — Ambulatory Visit (INDEPENDENT_AMBULATORY_CARE_PROVIDER_SITE_OTHER): Payer: Medicare Other | Admitting: Allergy and Immunology

## 2015-04-25 VITALS — BP 136/74 | HR 76 | Temp 98.0°F | Resp 18

## 2015-04-25 DIAGNOSIS — J4540 Moderate persistent asthma, uncomplicated: Secondary | ICD-10-CM

## 2015-04-25 MED ORDER — MONTELUKAST SODIUM 10 MG PO TABS: 10.0000 mg | ORAL_TABLET | Freq: Every day | ORAL | Status: DC

## 2015-04-25 NOTE — Progress Notes (Signed)
FOLLOW UP NOTE  RE: Vicki Dennis MRN: [id number] DOB: [birth year] [facility] [address] [place] [address] Date of Office Visit: 04/25/2015  Subjective:  Vicki Dennis is a 70 y.o. female who presents today for Nasal Congestion  Assessment:   1. Moderate persistent asthma, intermittent symptoms as related to below triggers.  2.      Allergic rhinoconjunctivitis, improved congestion. 3.      Recent respiratory infection, afebrile in no respiratory distress, likely viral. 4.      Suspected Hymenoptera Venom hypersensitivity vs delayed toxic reaction.  Plan:   Meds ordered this encounter  Medications  . montelukast (SINGULAIR) 10 MG tablet    Sig: Take 1 tablet (10 mg total) by mouth at bedtime.    Dispense:  30 tablet    Refill:  3   Patient Instructions  1.  Continue Flonase, Singulair and Zyrtec. 2.  Saline nasal wash 2-4 times daily. 3.  Use Symbicort 2 puffs twice daily for the next 3 weeks then decrease to 2 puffs once daily. (samples given) 4.  Add to Venom testing list again. 5.  Epi-pen/Benadryl as needed. 6.  Ventolin as needed 2 puffs for cough or wheeze.  7.  Follow-up in 6 months or sooner if needed and for venom testing with next available.  HPI: Vicki Dennis presents to the office with recent chest/nasal congestion with mucus production and throat irritation/sneezing.  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  She completed a Z-pack 2 weeks ago with improvement especially since she added Flonase consistently.  She denies headache or fever.  No albuterol use, difficulty in breathing, shortness of breath or wheeze.  Denies ED or urgent care visits or prednisone courses. Reports sleep and activity are normal.  No other new medical issues since her last visit here in June.  She continues to travel regularly without other concerns.  Current  Medications: 1.  Symbicort 1 puff once daily. 2.  Zyrtec 10mg  once daily. 3.  Flonase one spray daily. 4.  Singulair 10mg  once daily. 5.  Epi-pen/Benadryl as needed. 6.  Proventil as needed. 7.  Omega-3, MVI, Vasotec and ASA 81mg  daily.   Drug Allergies: Allergies  Allergen Reactions  . Codeine Other (See Comments)    "makes me crazy"  . Sulfa Antibiotics Anaphylaxis    "died 3 times".  . Oxycodone    Objective:   Filed Vitals:   04/25/15 1632  BP: 136/74  Pulse: 76  Temp: 98 F (36.7 C)  Resp: 18   Physical Exam  Constitutional: She is well-developed, well-nourished, and in no distress.  HENT:  Head: Atraumatic.  Right Ear: Tympanic membrane and ear canal normal.  Left Ear: Tympanic membrane and ear canal normal.  Nose: Mucosal edema present. No rhinorrhea. No epistaxis.  Mouth/Throat: Oropharynx is clear and moist and mucous membranes are normal. No oropharyngeal exudate, posterior oropharyngeal edema or posterior oropharyngeal erythema.  Neck: Neck supple.  Cardiovascular: Normal rate, S1 normal and S2 normal.   No murmur heard. Pulmonary/Chest: Effort normal. She has no wheezes. She has no rhonchi. She has no rales.  Lymphadenopathy:    She has no cervical adenopathy.   Diagnostics: Spirometry:  FVC 2.36--89%, FEV1 2.05--100%.      Malaysha Arlen M. Willa Rough, MD  cc: Danise Edge, MD

## 2015-04-25 NOTE — Patient Instructions (Signed)
   Continue Flonase, Singulair and Zyrtec.  Saline nasal wash 2-4 times daily.  Use Symbicort 80mcg 2 puffs twice daily for the next 3 weeks then decrease to 2 puffs once daily.  Add to Venom testing list.  Epi-pen/Benadryl as needed.  Ventolin as needed 2 puffs for cough or wheeze.

## 2015-05-14 ENCOUNTER — Other Ambulatory Visit: Payer: Self-pay | Admitting: Family Medicine

## 2015-05-14 DIAGNOSIS — I10 Essential (primary) hypertension: Secondary | ICD-10-CM

## 2015-05-14 MED ORDER — ENALAPRIL MALEATE 2.5 MG PO TABS: 2.5000 mg | ORAL_TABLET | Freq: Every day | ORAL | Status: DC

## 2015-07-10 ENCOUNTER — Other Ambulatory Visit: Payer: Self-pay

## 2015-07-10 MED ORDER — MONTELUKAST SODIUM 10 MG PO TABS: 10.0000 mg | ORAL_TABLET | Freq: Every day | ORAL | Status: DC

## 2015-07-16 ENCOUNTER — Encounter: Payer: Self-pay | Admitting: Medical

## 2015-07-16 ENCOUNTER — Telehealth: Payer: Self-pay | Admitting: *Deleted

## 2015-07-16 ENCOUNTER — Ambulatory Visit (INDEPENDENT_AMBULATORY_CARE_PROVIDER_SITE_OTHER): Payer: Medicare Other | Admitting: Medical

## 2015-07-16 VITALS — BP 120/80 | HR 89 | Temp 98.3°F | Ht 66.0 in | Wt 198.0 lb

## 2015-07-16 DIAGNOSIS — J209 Acute bronchitis, unspecified: Secondary | ICD-10-CM

## 2015-07-16 DIAGNOSIS — R112 Nausea with vomiting, unspecified: Secondary | ICD-10-CM

## 2015-07-16 DIAGNOSIS — J309 Allergic rhinitis, unspecified: Secondary | ICD-10-CM

## 2015-07-16 DIAGNOSIS — J4520 Mild intermittent asthma, uncomplicated: Secondary | ICD-10-CM

## 2015-07-16 MED ORDER — AZELASTINE HCL 0.1 % NA SOLN: 2.0000 | Freq: Two times a day (BID) | NASAL | Status: DC

## 2015-07-16 MED ORDER — METHYLPREDNISOLONE ACETATE 40 MG/ML IJ SUSP
40.0000 mg | Freq: Once | INTRAMUSCULAR | Status: AC
  Administered 2015-07-16: 40 mg via INTRAMUSCULAR

## 2015-07-16 MED ORDER — ONDANSETRON 8 MG PO TBDP: 8.0000 mg | ORAL_TABLET | Freq: Three times a day (TID) | ORAL | Status: DC | PRN

## 2015-07-16 MED ORDER — AZITHROMYCIN 250 MG PO TABS: ORAL_TABLET | ORAL | Status: DC

## 2015-07-16 NOTE — Progress Notes (Signed)
Pre visit review using our clinic review tool, if applicable. No additional management support is needed unless otherwise documented below in the visit note. 

## 2015-07-16 NOTE — Telephone Encounter (Signed)
Attempted to reach patient to follow-up with the below request. Left message for patient to return call when available.

## 2015-07-16 NOTE — Progress Notes (Signed)
Subjective:    Patient ID: Vicki Dennis, female    DOB: 1945-05-16, 70 y.o.   MRN: [id number]  HPI   Pt in states has hoarse voice and feels like  mucous in back of there throat. Pt has history of asthma and allergies. Pt has been  Faint mild  wheezing recently and watery eyes. Also a lot of pnd.   Pt feels chest congestion and some sinus congestion. Blowing out mucous from her nose while in shower.  Above symptoms present for about 1 wk.   Hx of reflux and uses zantac. Pt feels like recently heart burn not improved. Flare of symptoms. Then states zantac has helped in the past but not recently. She has been using zantac 75 mg one time a day. Vomited 3 times yesterday.  Pt has no abdomen pain. Only one loose stool day this am. But no loose stools today.  Past Medical History  Diagnosis Date  . Hyperlipidemia   . Hypertension   . Portal vein thrombosis 03/09/2011    coumadin ended summer of 2013  . Asthma   . Blood transfusion without reported diagnosis   . GERD (gastroesophageal reflux disease)     rare use of OTC aid  . Anemia     h/o  . Pneumonia     as a child  . Diabetes mellitus     type-II, per Dr. Parke Simmers, no meds. , pt. reports weight loss has contributed  to control of diabetes.  She reports last HgbA1c- wnl.  . Diabetes mellitus 11/05/2010  . Chicken pox as a child  . Measles as a child  . Mumps as a child  . Diabetes mellitus type 2, uncontrolled (HCC) 11/05/2010  . Allergy   . Diabetes mellitus type 2 in obese (HCC) 11/05/2010  . Obesity 02/10/2014  . Esophageal reflux 02/10/2014  . Seborrheic keratosis 02/10/2014    Follows with dermatology  . Asthma, mild intermittent, well-controlled 02/10/2014  . Medicare annual wellness visit, subsequent 08/06/2014  . Preventative health care 08/12/2014  . Rash and nonspecific skin eruption 01/07/2015    Social History   Social History  . Marital Status: Divorced    Spouse Name: N/A  . Number of Children: N/A  .  Years of Education: N/A   Occupational History  . Not on file.   Social History Main Topics  . Smoking status: Never Smoker   . Smokeless tobacco: Never Used     Comment: never used tobacco  . Alcohol Use: 0.0 oz/week    0 Standard drinks or equivalent per week     Comment: for holidays  . Drug Use: No  . Sexual Activity: No     Comment: lives alone    Other Topics Concern  . Not on file   Social History Narrative    Past Surgical History  Procedure Laterality Date  . Knee arthroscopy w/ meniscal repair      both knees  . Tonsillectomy    . Umbilical hernia repair  03/16/2012    Procedure: HERNIA REPAIR UMBILICAL ADULT;  Surgeon: Shelly Rubenstein, MD;  Location: [facility] OR;  Service: General;  Laterality: N/A;  . Insertion of mesh  03/16/2012    Procedure: INSERTION OF MESH;  Surgeon: Shelly Rubenstein, MD;  Location: [facility] OR;  Service: General;  Laterality: N/A;  . Breast surgery      benign - cysts    Family History  Problem Relation Age of Onset  . Cancer Father  prostate  . Cancer Maternal Aunt   . Aneurysm Mother 43    brain    Allergies  Allergen Reactions  . Codeine Other (See Comments)    "makes me crazy"  . Sulfa Antibiotics Anaphylaxis    "died 3 times".  . Oxycodone     Current Outpatient Prescriptions on File Prior to Visit  Medication Sig Dispense Refill  . aspirin 81 MG tablet Take 81 mg by mouth daily.     . budesonide-formoterol (SYMBICORT) 80-4.5 MCG/ACT inhaler Inhale 1 puff into the lungs daily. Uses with seasonal allergies in the Spring and the Fall.    . cetirizine (ZYRTEC) 10 MG tablet Take 10 mg by mouth daily with breakfast.     . enalapril (VASOTEC) 2.5 MG tablet Take 1 tablet (2.5 mg total) by mouth daily. 30 tablet 2  . EPIPEN 2-PAK 0.3 MG/0.3ML SOAJ injection     . fluticasone (FLONASE) 50 MCG/ACT nasal spray Place 2 sprays into both nostrils daily. 16 g 6  . glucose blood (ACCU-CHEK AVIVA PLUS) test strip Use as directed once  daily to check blood sugar.  Diagnosis code E11.9 100 each 5  . Lancets 30G MISC Use as directed once daily to check blood sugar.  Diagnosis code E11.9 100 each 5  . montelukast (SINGULAIR) 10 MG tablet Take 1 tablet (10 mg total) by mouth at bedtime. 30 tablet 5  . Multiple Vitamins-Minerals (MULTIVITAMIN WITH MINERALS) tablet Take 1 tablet by mouth daily.      . Omega-3 Fatty Acids (FISH OIL CONCENTRATE) 1000 MG CAPS Take by mouth 3 (three) times daily.    Marland Kitchen PROVENTIL HFA 108 (90 BASE) MCG/ACT inhaler      No current facility-administered medications on file prior to visit.    BP 120/80 mmHg  Pulse 89  Temp(Src) 98.3 F (36.8 C) (Oral)  Ht 5\' 6"  (1.676 m)  Wt 198 lb (89.812 kg)  BMI 31.97 kg/m2  SpO2 98%     Review of Systems  Constitutional: Negative for fever, chills and fatigue.  HENT: Positive for congestion and postnasal drip. Negative for ear pain, rhinorrhea, sinus pressure and sneezing.   Respiratory: Positive for cough. Negative for choking, shortness of breath and wheezing.   Cardiovascular: Negative for chest pain and palpitations.  Gastrointestinal: Negative for nausea and abdominal pain.  Musculoskeletal: Negative for back pain.  Neurological: Negative for dizziness and headaches.  Hematological: Negative for adenopathy. Does not bruise/bleed easily.  Psychiatric/Behavioral: Negative for behavioral problems and confusion.        Objective:   Physical Exam  General  Mental Status - Alert. General Appearance - Well groomed. Not in acute distress.  Skin Rashes- No Rashes.  HEENT Head- Normal. Ear Auditory Canal - Left- Normal. Right - Normal.Tympanic Membrane- Left- Normal. Right- Normal. Eye Sclera/Conjunctiva- Left- Normal. Right- Normal. Nose & Sinuses Nasal Mucosa- Left-  Boggy and Congested. Right-  Boggy and  Congested.Bilateral  No maxillary and no  frontal sinus pressure. Mouth & Throat Lips: Upper Lip- Normal: no dryness, cracking, pallor,  cyanosis, or vesicular eruption. Lower Lip-Normal: no dryness, cracking, pallor, cyanosis or vesicular eruption. Buccal Mucosa- Bilateral- No Aphthous ulcers. Oropharynx- No Discharge or Erythema. Tonsils: Characteristics- Bilateral- No Erythema or Congestion. Size/Enlargement- Bilateral- No enlargement. Discharge- bilateral-None.  Neck Neck- Supple. No Masses.   Chest and Lung Exam Auscultation: Breath Sounds:- even and unlabored. Shallow.   Cardiovascular Auscultation:Rythm- Regular, rate and rhythm. Murmurs & Other Heart Sounds:Ausculatation of the heart reveal- No Murmurs.  Lymphatic Head & Neck General Head & Neck Lymphatics: Bilateral: Description- No Localized lymphadenopathy.   Abdomen Inspection:-Inspection Normal.  Palpation/Perucssion: Palpation and Percussion of the abdomen reveal- Non Tender, No Rebound tenderness, No rigidity(Guarding) and No Palpable abdominal masses.  Liver:-Normal.  Spleen:- Normal.    Back- no cva tenderness.        Assessment & Plan:  For your allergies continue montelukast, flonase, and zyrtec. Will add astelin.  For asthma and the above continue symbicort and proventil. Will go ahead and rx depomedrol 40 mg im.  Your recent vomiting seems to be resolved but will rx zofran if this reoccurs. Let me know if other GI symptoms occur.   Also for recent reflux flare would recommend using ranitidine 150 mg twice daily.  For potential bronchitis symptoms as well. Will rx azithromycin.  Follow up in 7 days or as needed.

## 2015-07-16 NOTE — Patient Instructions (Addendum)
For your allergies continue montelukast, flonase, and zyrtec. Will add astelin.  For asthma and the above continue symbicort and proventil. Will go ahead and rx depomedrol 40 mg im.  Your recent vomiting seems to be resolved but will rx zofran if this reoccurs. Let me know if other GI symptoms occur.   Also for recent reflux flare would recommend using ranitidine 150 mg twice daily.  For potential bronchitis symptoms as well. Will rx azithromycin.  Follow up in 7 days or as needed.

## 2015-07-16 NOTE — Telephone Encounter (Signed)
Received [employer] message stating that "she doesn't feel well and wants to make an appointment. Please call.", LMOM with contact name and number for return call RE: TM message/SLS 04/04

## 2015-07-16 NOTE — Telephone Encounter (Signed)
[employer] note received via fax  Call Date: 07/16/15 Time: 4:09 AM   Caller: Self Alvy Beal) Return number: [phone]  Call Closed By: Moody Lions  Chief Complaint: Caller states she doesn't feel well and wants to make an appointment.  Reason for call: Request to schedule office appointment

## 2015-08-18 ENCOUNTER — Other Ambulatory Visit: Payer: Self-pay | Admitting: Family Medicine

## 2015-08-23 ENCOUNTER — Other Ambulatory Visit: Payer: Medicare Other

## 2015-08-23 ENCOUNTER — Ambulatory Visit: Payer: Medicare Other | Admitting: Hematology & Oncology

## 2015-09-02 ENCOUNTER — Ambulatory Visit: Payer: Medicare Other | Admitting: Hematology & Oncology

## 2015-09-02 ENCOUNTER — Ambulatory Visit (INDEPENDENT_AMBULATORY_CARE_PROVIDER_SITE_OTHER): Payer: Medicare Other | Admitting: Family Medicine

## 2015-09-02 ENCOUNTER — Encounter: Payer: Self-pay | Admitting: Family Medicine

## 2015-09-02 ENCOUNTER — Other Ambulatory Visit: Payer: Medicare Other

## 2015-09-02 VITALS — BP 118/72 | HR 79 | Temp 98.1°F | Ht 66.0 in | Wt 199.0 lb

## 2015-09-02 DIAGNOSIS — E1169 Type 2 diabetes mellitus with other specified complication: Secondary | ICD-10-CM

## 2015-09-02 DIAGNOSIS — J4520 Mild intermittent asthma, uncomplicated: Secondary | ICD-10-CM

## 2015-09-02 DIAGNOSIS — Z124 Encounter for screening for malignant neoplasm of cervix: Secondary | ICD-10-CM

## 2015-09-02 DIAGNOSIS — Z0000 Encounter for general adult medical examination without abnormal findings: Secondary | ICD-10-CM | POA: Diagnosis not present

## 2015-09-02 DIAGNOSIS — K429 Umbilical hernia without obstruction or gangrene: Secondary | ICD-10-CM

## 2015-09-02 DIAGNOSIS — I10 Essential (primary) hypertension: Secondary | ICD-10-CM

## 2015-09-02 DIAGNOSIS — I81 Portal vein thrombosis: Secondary | ICD-10-CM

## 2015-09-02 DIAGNOSIS — E785 Hyperlipidemia, unspecified: Secondary | ICD-10-CM

## 2015-09-02 DIAGNOSIS — E119 Type 2 diabetes mellitus without complications: Secondary | ICD-10-CM

## 2015-09-02 DIAGNOSIS — E669 Obesity, unspecified: Secondary | ICD-10-CM

## 2015-09-02 DIAGNOSIS — R739 Hyperglycemia, unspecified: Secondary | ICD-10-CM

## 2015-09-02 HISTORY — DX: Hyperglycemia, unspecified: R73.9

## 2015-09-02 LAB — COMPREHENSIVE METABOLIC PANEL
ALK PHOS: 63 U/L (ref 39–117)
ALT: 14 U/L (ref 0–35)
AST: 15 U/L (ref 0–37)
Albumin: 4.2 g/dL (ref 3.5–5.2)
BILIRUBIN TOTAL: 0.5 mg/dL (ref 0.2–1.2)
BUN: 16 mg/dL (ref 6–23)
CO2: 27 meq/L (ref 19–32)
Calcium: 9.7 mg/dL (ref 8.4–10.5)
Chloride: 105 mEq/L (ref 96–112)
Creatinine, Ser: 0.67 mg/dL (ref 0.40–1.20)
GFR: 111.19 mL/min (ref >60.00)
GLUCOSE: 95 mg/dL (ref 70–99)
Potassium: 3.9 mEq/L (ref 3.5–5.1)
SODIUM: 139 meq/L (ref 135–145)
TOTAL PROTEIN: 6.6 g/dL (ref 6.0–8.3)

## 2015-09-02 LAB — LIPID PANEL
CHOL/HDL RATIO: 4
CHOLESTEROL: 208 mg/dL — AB (ref 0–200)
HDL: 54.2 mg/dL (ref >39.00)
LDL Cholesterol: 135 mg/dL — ABNORMAL HIGH (ref 0–99)
NONHDL: 153.96
Triglycerides: 94 mg/dL (ref 0.0–149.0)
VLDL: 18.8 mg/dL (ref 0.0–40.0)

## 2015-09-02 LAB — TSH: TSH: 0.99 u[IU]/mL (ref 0.35–4.50)

## 2015-09-02 LAB — CBC
HEMATOCRIT: 41 % (ref 36.0–46.0)
HEMOGLOBIN: 13.3 g/dL (ref 12.0–15.0)
MCHC: 32.5 g/dL (ref 30.0–36.0)
MCV: 88 fl (ref 78.0–100.0)
Platelets: 219 10*3/uL (ref 150.0–400.0)
RBC: 4.66 Mil/uL (ref 3.87–5.11)
RDW: 14.6 % (ref 11.5–15.5)
WBC: 7.5 10*3/uL (ref 4.0–10.5)

## 2015-09-02 LAB — HEMOGLOBIN A1C: Hgb A1c MFr Bld: 7.1 % — ABNORMAL HIGH (ref 4.6–6.5)

## 2015-09-02 MED ORDER — ONDANSETRON 8 MG PO TBDP
8.0000 mg | ORAL_TABLET | Freq: Three times a day (TID) | ORAL | Status: DC | PRN
Start: 2015-09-02 — End: 2015-10-16

## 2015-09-02 MED ORDER — GLUCOSE BLOOD VI STRP: ORAL_STRIP | Status: DC

## 2015-09-02 NOTE — Patient Instructions (Signed)
Preventive Care for Adults, Female A healthy lifestyle and preventive care can promote health and wellness. Preventive health guidelines for women include the following key practices.  A routine yearly physical is a good way to check with your health care provider about your health and preventive screening. It is a chance to share any concerns and updates on your health and to receive a thorough exam.  Visit your dentist for a routine exam and preventive care every 6 months. Brush your teeth twice a day and floss once a day. Good oral hygiene prevents tooth decay and gum disease.  The frequency of eye exams is based on your age, health, family medical history, use of contact lenses, and other factors. Follow your health care provider's recommendations for frequency of eye exams.  Eat a healthy diet. Foods like vegetables, fruits, whole grains, low-fat dairy products, and lean protein foods contain the nutrients you need without too many calories. Decrease your intake of foods high in solid fats, added sugars, and salt. Eat the right amount of calories for you.Get information about a proper diet from your health care provider, if necessary.  Regular physical exercise is one of the most important things you can do for your health. Most adults should get at least 150 minutes of moderate-intensity exercise (any activity that increases your heart rate and causes you to sweat) each week. In addition, most adults need muscle-strengthening exercises on 2 or more days a week.  Maintain a healthy weight. The body mass index (BMI) is a screening tool to identify possible weight problems. It provides an estimate of body fat based on height and weight. Your health care provider can find your BMI and can help you achieve or maintain a healthy weight.For adults 20 years and older:  A BMI below 18.5 is considered underweight.  A BMI of 18.5 to 24.9 is normal.  A BMI of 25 to 29.9 is considered overweight.  A  BMI of 30 and above is considered obese.  Maintain normal blood lipids and cholesterol levels by exercising and minimizing your intake of saturated fat. Eat a balanced diet with plenty of fruit and vegetables. Blood tests for lipids and cholesterol should begin at age 45 and be repeated every 5 years. If your lipid or cholesterol levels are high, you are over 50, or you are at high risk for heart disease, you may need your cholesterol levels checked more frequently.Ongoing high lipid and cholesterol levels should be treated with medicines if diet and exercise are not working.  If you smoke, find out from your health care provider how to quit. If you do not use tobacco, do not start.  Lung cancer screening is recommended for adults aged 45-80 years who are at high risk for developing lung cancer because of a history of smoking. A yearly low-dose CT scan of the lungs is recommended for people who have at least a 30-pack-year history of smoking and are a current smoker or have quit within the past 15 years. A pack year of smoking is smoking an average of 1 pack of cigarettes a day for 1 year (for example: 1 pack a day for 30 years or 2 packs a day for 15 years). Yearly screening should continue until the smoker has stopped smoking for at least 15 years. Yearly screening should be stopped for people who develop a health problem that would prevent them from having lung cancer treatment.  If you are pregnant, do not drink alcohol. If you are  breastfeeding, be very cautious about drinking alcohol. If you are not pregnant and choose to drink alcohol, do not have more than 1 drink per day. One drink is considered to be 12 ounces (355 mL) of beer, 5 ounces (148 mL) of wine, or 1.5 ounces (44 mL) of liquor.  Avoid use of street drugs. Do not share needles with anyone. Ask for help if you need support or instructions about stopping the use of drugs.  High blood pressure causes heart disease and increases the risk  of stroke. Your blood pressure should be checked at least every 1 to 2 years. Ongoing high blood pressure should be treated with medicines if weight loss and exercise do not work.  If you are 55-79 years old, ask your health care provider if you should take aspirin to prevent strokes.  Diabetes screening is done by taking a blood sample to check your blood glucose level after you have not eaten for a certain period of time (fasting). If you are not overweight and you do not have risk factors for diabetes, you should be screened once every 3 years starting at age 45. If you are overweight or obese and you are 40-70 years of age, you should be screened for diabetes every year as part of your cardiovascular risk assessment.  Breast cancer screening is essential preventive care for women. You should practice "breast self-awareness." This means understanding the normal appearance and feel of your breasts and may include breast self-examination. Any changes detected, no matter how small, should be reported to a health care provider. Women in their 20s and 30s should have a clinical breast exam (CBE) by a health care provider as part of a regular health exam every 1 to 3 years. After age 40, women should have a CBE every year. Starting at age 40, women should consider having a mammogram (breast X-ray test) every year. Women who have a family history of breast cancer should talk to their health care provider about genetic screening. Women at a high risk of breast cancer should talk to their health care providers about having an MRI and a mammogram every year.  Breast cancer gene (BRCA)-related cancer risk assessment is recommended for women who have family members with BRCA-related cancers. BRCA-related cancers include breast, ovarian, tubal, and peritoneal cancers. Having family members with these cancers may be associated with an increased risk for harmful changes (mutations) in the breast cancer genes BRCA1 and  BRCA2. Results of the assessment will determine the need for genetic counseling and BRCA1 and BRCA2 testing.  Your health care provider may recommend that you be screened regularly for cancer of the pelvic organs (ovaries, uterus, and vagina). This screening involves a pelvic examination, including checking for microscopic changes to the surface of your cervix (Pap test). You may be encouraged to have this screening done every 3 years, beginning at age 21.  For women ages 30-65, health care providers may recommend pelvic exams and Pap testing every 3 years, or they may recommend the Pap and pelvic exam, combined with testing for human papilloma virus (HPV), every 5 years. Some types of HPV increase your risk of cervical cancer. Testing for HPV may also be done on women of any age with unclear Pap test results.  Other health care providers may not recommend any screening for nonpregnant women who are considered low risk for pelvic cancer and who do not have symptoms. Ask your health care provider if a screening pelvic exam is right for   you.  If you have had past treatment for cervical cancer or a condition that could lead to cancer, you need Pap tests and screening for cancer for at least 20 years after your treatment. If Pap tests have been discontinued, your risk factors (such as having a new sexual partner) need to be reassessed to determine if screening should resume. Some women have medical problems that increase the chance of getting cervical cancer. In these cases, your health care provider may recommend more frequent screening and Pap tests.  Colorectal cancer can be detected and often prevented. Most routine colorectal cancer screening begins at the age of 50 years and continues through age 75 years. However, your health care provider may recommend screening at an earlier age if you have risk factors for colon cancer. On a yearly basis, your health care provider may provide home test kits to check  for hidden blood in the stool. Use of a small camera at the end of a tube, to directly examine the colon (sigmoidoscopy or colonoscopy), can detect the earliest forms of colorectal cancer. Talk to your health care provider about this at age 50, when routine screening begins. Direct exam of the colon should be repeated every 5-10 years through age 75 years, unless early forms of precancerous polyps or small growths are found.  People who are at an increased risk for hepatitis B should be screened for this virus. You are considered at high risk for hepatitis B if:  You were born in a country where hepatitis B occurs often. Talk with your health care provider about which countries are considered high risk.  Your parents were born in a high-risk country and you have not received a shot to protect against hepatitis B (hepatitis B vaccine).  You have HIV or AIDS.  You use needles to inject street drugs.  You live with, or have sex with, someone who has hepatitis B.  You get hemodialysis treatment.  You take certain medicines for conditions like cancer, organ transplantation, and autoimmune conditions.  Hepatitis C blood testing is recommended for all people born from 1945 through 1965 and any individual with known risks for hepatitis C.  Practice safe sex. Use condoms and avoid high-risk sexual practices to reduce the spread of sexually transmitted infections (STIs). STIs include gonorrhea, chlamydia, syphilis, trichomonas, herpes, HPV, and human immunodeficiency virus (HIV). Herpes, HIV, and HPV are viral illnesses that have no cure. They can result in disability, cancer, and death.  You should be screened for sexually transmitted illnesses (STIs) including gonorrhea and chlamydia if:  You are sexually active and are younger than 24 years.  You are older than 24 years and your health care provider tells you that you are at risk for this type of infection.  Your sexual activity has changed  since you were last screened and you are at an increased risk for chlamydia or gonorrhea. Ask your health care provider if you are at risk.  If you are at risk of being infected with HIV, it is recommended that you take a prescription medicine daily to prevent HIV infection. This is called preexposure prophylaxis (PrEP). You are considered at risk if:  You are sexually active and do not regularly use condoms or know the HIV status of your partner(s).  You take drugs by injection.  You are sexually active with a partner who has HIV.  Talk with your health care provider about whether you are at high risk of being infected with HIV. If   you choose to begin PrEP, you should first be tested for HIV. You should then be tested every 3 months for as long as you are taking PrEP.  Osteoporosis is a disease in which the bones lose minerals and strength with aging. This can result in serious bone fractures or breaks. The risk of osteoporosis can be identified using a bone density scan. Women ages 67 years and over and women at risk for fractures or osteoporosis should discuss screening with their health care providers. Ask your health care provider whether you should take a calcium supplement or vitamin D to reduce the rate of osteoporosis.  Menopause can be associated with physical symptoms and risks. Hormone replacement therapy is available to decrease symptoms and risks. You should talk to your health care provider about whether hormone replacement therapy is right for you.  Use sunscreen. Apply sunscreen liberally and repeatedly throughout the day. You should seek shade when your shadow is shorter than you. Protect yourself by wearing long sleeves, pants, a wide-brimmed hat, and sunglasses year round, whenever you are outdoors.  Once a month, do a whole body skin exam, using a mirror to look at the skin on your back. Tell your health care provider of new moles, moles that have irregular borders, moles that  are larger than a pencil eraser, or moles that have changed in shape or color.  Stay current with required vaccines (immunizations).  Influenza vaccine. All adults should be immunized every year.  Tetanus, diphtheria, and acellular pertussis (Td, Tdap) vaccine. Pregnant women should receive 1 dose of Tdap vaccine during each pregnancy. The dose should be obtained regardless of the length of time since the last dose. Immunization is preferred during the 27th-36th week of gestation. An adult who has not previously received Tdap or who does not know her vaccine status should receive 1 dose of Tdap. This initial dose should be followed by tetanus and diphtheria toxoids (Td) booster doses every 10 years. Adults with an unknown or incomplete history of completing a 3-dose immunization series with Td-containing vaccines should begin or complete a primary immunization series including a Tdap dose. Adults should receive a Td booster every 10 years.  Varicella vaccine. An adult without evidence of immunity to varicella should receive 2 doses or a second dose if she has previously received 1 dose. Pregnant females who do not have evidence of immunity should receive the first dose after pregnancy. This first dose should be obtained before leaving the health care facility. The second dose should be obtained 4-8 weeks after the first dose.  Human papillomavirus (HPV) vaccine. Females aged 13-26 years who have not received the vaccine previously should obtain the 3-dose series. The vaccine is not recommended for use in pregnant females. However, pregnancy testing is not needed before receiving a dose. If a female is found to be pregnant after receiving a dose, no treatment is needed. In that case, the remaining doses should be delayed until after the pregnancy. Immunization is recommended for any person with an immunocompromised condition through the age of 61 years if she did not get any or all doses earlier. During the  3-dose series, the second dose should be obtained 4-8 weeks after the first dose. The third dose should be obtained 24 weeks after the first dose and 16 weeks after the second dose.  Zoster vaccine. One dose is recommended for adults aged 30 years or older unless certain conditions are present.  Measles, mumps, and rubella (MMR) vaccine. Adults born  before 1957 generally are considered immune to measles and mumps. Adults born in 1957 or later should have 1 or more doses of MMR vaccine unless there is a contraindication to the vaccine or there is laboratory evidence of immunity to each of the three diseases. A routine second dose of MMR vaccine should be obtained at least 28 days after the first dose for students attending postsecondary schools, health care workers, or international travelers. People who received inactivated measles vaccine or an unknown type of measles vaccine during 1963-1967 should receive 2 doses of MMR vaccine. People who received inactivated mumps vaccine or an unknown type of mumps vaccine before 1979 and are at high risk for mumps infection should consider immunization with 2 doses of MMR vaccine. For females of childbearing age, rubella immunity should be determined. If there is no evidence of immunity, females who are not pregnant should be vaccinated. If there is no evidence of immunity, females who are pregnant should delay immunization until after pregnancy. Unvaccinated health care workers born before 1957 who lack laboratory evidence of measles, mumps, or rubella immunity or laboratory confirmation of disease should consider measles and mumps immunization with 2 doses of MMR vaccine or rubella immunization with 1 dose of MMR vaccine.  Pneumococcal 13-valent conjugate (PCV13) vaccine. When indicated, a person who is uncertain of his immunization history and has no record of immunization should receive the PCV13 vaccine. All adults 65 years of age and older should receive this  vaccine. An adult aged 19 years or older who has certain medical conditions and has not been previously immunized should receive 1 dose of PCV13 vaccine. This PCV13 should be followed with a dose of pneumococcal polysaccharide (PPSV23) vaccine. Adults who are at high risk for pneumococcal disease should obtain the PPSV23 vaccine at least 8 weeks after the dose of PCV13 vaccine. Adults older than 72 years of age who have normal immune system function should obtain the PPSV23 vaccine dose at least 1 year after the dose of PCV13 vaccine.  Pneumococcal polysaccharide (PPSV23) vaccine. When PCV13 is also indicated, PCV13 should be obtained first. All adults aged 65 years and older should be immunized. An adult younger than age 65 years who has certain medical conditions should be immunized. Any person who resides in a nursing home or long-term care facility should be immunized. An adult smoker should be immunized. People with an immunocompromised condition and certain other conditions should receive both PCV13 and PPSV23 vaccines. People with human immunodeficiency virus (HIV) infection should be immunized as soon as possible after diagnosis. Immunization during chemotherapy or radiation therapy should be avoided. Routine use of PPSV23 vaccine is not recommended for American Indians, [place] Natives, or people younger than 65 years unless there are medical conditions that require PPSV23 vaccine. When indicated, people who have unknown immunization and have no record of immunization should receive PPSV23 vaccine. One-time revaccination 5 years after the first dose of PPSV23 is recommended for people aged 19-64 years who have chronic kidney failure, nephrotic syndrome, asplenia, or immunocompromised conditions. People who received 1-2 doses of PPSV23 before age 65 years should receive another dose of PPSV23 vaccine at age 65 years or later if at least 5 years have passed since the previous dose. Doses of PPSV23 are not  needed for people immunized with PPSV23 at or after age 65 years.  Meningococcal vaccine. Adults with asplenia or persistent complement component deficiencies should receive 2 doses of quadrivalent meningococcal conjugate (MenACWY-D) vaccine. The doses should be obtained   at least 2 months apart. Microbiologists working with certain meningococcal bacteria, [place] recruits, people at risk during an outbreak, and people who travel to or live in countries with a high rate of meningitis should be immunized. A first-year college student up through age 34 years who is living in a residence hall should receive a dose if she did not receive a dose on or after her 16th birthday. Adults who have certain high-risk conditions should receive one or more doses of vaccine.  Hepatitis A vaccine. Adults who wish to be protected from this disease, have certain high-risk conditions, work with hepatitis A-infected animals, work in hepatitis A research labs, or travel to or work in countries with a high rate of hepatitis A should be immunized. Adults who were previously unvaccinated and who anticipate close contact with an international adoptee during the first 60 days after arrival in the [place] from a country with a high rate of hepatitis A should be immunized.  Hepatitis B vaccine. Adults who wish to be protected from this disease, have certain high-risk conditions, may be exposed to blood or other infectious body fluids, are household contacts or sex partners of hepatitis B positive people, are clients or workers in certain care facilities, or travel to or work in countries with a high rate of hepatitis B should be immunized.  Haemophilus influenzae type b (Hib) vaccine. A previously unvaccinated person with asplenia or sickle cell disease or having a scheduled splenectomy should receive 1 dose of Hib vaccine. Regardless of previous immunization, a recipient of a hematopoietic stem cell transplant should receive a  3-dose series 6-12 months after her successful transplant. Hib vaccine is not recommended for adults with HIV infection. Preventive Services / Frequency Ages 35 to 4 years  Blood pressure check.** / Every 3-5 years.  Lipid and cholesterol check.** / Every 5 years beginning at age 60.  Clinical breast exam.** / Every 3 years for women in their 71s and 10s.  BRCA-related cancer risk assessment.** / For women who have family members with a BRCA-related cancer (breast, ovarian, tubal, or peritoneal cancers).  Pap test.** / Every 2 years from ages 76 through 26. Every 3 years starting at age 61 through age 76 or 93 with a history of 3 consecutive normal Pap tests.  HPV screening.** / Every 3 years from ages 37 through ages 60 to 51 with a history of 3 consecutive normal Pap tests.  Hepatitis C blood test.** / For any individual with known risks for hepatitis C.  Skin self-exam. / Monthly.  Influenza vaccine. / Every year.  Tetanus, diphtheria, and acellular pertussis (Tdap, Td) vaccine.** / Consult your health care provider. Pregnant women should receive 1 dose of Tdap vaccine during each pregnancy. 1 dose of Td every 10 years.  Varicella vaccine.** / Consult your health care provider. Pregnant females who do not have evidence of immunity should receive the first dose after pregnancy.  HPV vaccine. / 3 doses over 6 months, if 93 and younger. The vaccine is not recommended for use in pregnant females. However, pregnancy testing is not needed before receiving a dose.  Measles, mumps, rubella (MMR) vaccine.** / You need at least 1 dose of MMR if you were born in 1957 or later. You may also need a 2nd dose. For females of childbearing age, rubella immunity should be determined. If there is no evidence of immunity, females who are not pregnant should be vaccinated. If there is no evidence of immunity, females who are  pregnant should delay immunization until after pregnancy.  Pneumococcal  13-valent conjugate (PCV13) vaccine.** / Consult your health care provider.  Pneumococcal polysaccharide (PPSV23) vaccine.** / 1 to 2 doses if you smoke cigarettes or if you have certain conditions.  Meningococcal vaccine.** / 1 dose if you are age 68 to 8 years and a Market researcher living in a residence hall, or have one of several medical conditions, you need to get vaccinated against meningococcal disease. You may also need additional booster doses.  Hepatitis A vaccine.** / Consult your health care provider.  Hepatitis B vaccine.** / Consult your health care provider.  Haemophilus influenzae type b (Hib) vaccine.** / Consult your health care provider. Ages 7 to 53 years  Blood pressure check.** / Every year.  Lipid and cholesterol check.** / Every 5 years beginning at age 25 years.  Lung cancer screening. / Every year if you are aged 11-80 years and have a 30-pack-year history of smoking and currently smoke or have quit within the past 15 years. Yearly screening is stopped once you have quit smoking for at least 15 years or develop a health problem that would prevent you from having lung cancer treatment.  Clinical breast exam.** / Every year after age 48 years.  BRCA-related cancer risk assessment.** / For women who have family members with a BRCA-related cancer (breast, ovarian, tubal, or peritoneal cancers).  Mammogram.** / Every year beginning at age 41 years and continuing for as long as you are in good health. Consult with your health care provider.  Pap test.** / Every 3 years starting at age 65 years through age 37 or 70 years with a history of 3 consecutive normal Pap tests.  HPV screening.** / Every 3 years from ages 72 years through ages 60 to 40 years with a history of 3 consecutive normal Pap tests.  Fecal occult blood test (FOBT) of stool. / Every year beginning at age 21 years and continuing until age 5 years. You may not need to do this test if you get  a colonoscopy every 10 years.  Flexible sigmoidoscopy or colonoscopy.** / Every 5 years for a flexible sigmoidoscopy or every 10 years for a colonoscopy beginning at age 35 years and continuing until age 48 years.  Hepatitis C blood test.** / For all people born from [birth year] through 1965 and any individual with known risks for hepatitis C.  Skin self-exam. / Monthly.  Influenza vaccine. / Every year.  Tetanus, diphtheria, and acellular pertussis (Tdap/Td) vaccine.** / Consult your health care provider. Pregnant women should receive 1 dose of Tdap vaccine during each pregnancy. 1 dose of Td every 10 years.  Varicella vaccine.** / Consult your health care provider. Pregnant females who do not have evidence of immunity should receive the first dose after pregnancy.  Zoster vaccine.** / 1 dose for adults aged 30 years or older.  Measles, mumps, rubella (MMR) vaccine.** / You need at least 1 dose of MMR if you were born in 1957 or later. You may also need a second dose. For females of childbearing age, rubella immunity should be determined. If there is no evidence of immunity, females who are not pregnant should be vaccinated. If there is no evidence of immunity, females who are pregnant should delay immunization until after pregnancy.  Pneumococcal 13-valent conjugate (PCV13) vaccine.** / Consult your health care provider.  Pneumococcal polysaccharide (PPSV23) vaccine.** / 1 to 2 doses if you smoke cigarettes or if you have certain conditions.  Meningococcal vaccine.** /  Consult your health care provider.  Hepatitis A vaccine.** / Consult your health care provider.  Hepatitis B vaccine.** / Consult your health care provider.  Haemophilus influenzae type b (Hib) vaccine.** / Consult your health care provider. Ages 64 years and over  Blood pressure check.** / Every year.  Lipid and cholesterol check.** / Every 5 years beginning at age 23 years.  Lung cancer screening. / Every year if you  are aged 16-80 years and have a 30-pack-year history of smoking and currently smoke or have quit within the past 15 years. Yearly screening is stopped once you have quit smoking for at least 15 years or develop a health problem that would prevent you from having lung cancer treatment.  Clinical breast exam.** / Every year after age 74 years.  BRCA-related cancer risk assessment.** / For women who have family members with a BRCA-related cancer (breast, ovarian, tubal, or peritoneal cancers).  Mammogram.** / Every year beginning at age 44 years and continuing for as long as you are in good health. Consult with your health care provider.  Pap test.** / Every 3 years starting at age 58 years through age 22 or 39 years with 3 consecutive normal Pap tests. Testing can be stopped between 65 and 70 years with 3 consecutive normal Pap tests and no abnormal Pap or HPV tests in the past 10 years.  HPV screening.** / Every 3 years from ages 64 years through ages 70 or 61 years with a history of 3 consecutive normal Pap tests. Testing can be stopped between 65 and 70 years with 3 consecutive normal Pap tests and no abnormal Pap or HPV tests in the past 10 years.  Fecal occult blood test (FOBT) of stool. / Every year beginning at age 40 years and continuing until age 27 years. You may not need to do this test if you get a colonoscopy every 10 years.  Flexible sigmoidoscopy or colonoscopy.** / Every 5 years for a flexible sigmoidoscopy or every 10 years for a colonoscopy beginning at age 7 years and continuing until age 32 years.  Hepatitis C blood test.** / For all people born from [birth year] through 1965 and any individual with known risks for hepatitis C.  Osteoporosis screening.** / A one-time screening for women ages 30 years and over and women at risk for fractures or osteoporosis.  Skin self-exam. / Monthly.  Influenza vaccine. / Every year.  Tetanus, diphtheria, and acellular pertussis (Tdap/Td)  vaccine.** / 1 dose of Td every 10 years.  Varicella vaccine.** / Consult your health care provider.  Zoster vaccine.** / 1 dose for adults aged 35 years or older.  Pneumococcal 13-valent conjugate (PCV13) vaccine.** / Consult your health care provider.  Pneumococcal polysaccharide (PPSV23) vaccine.** / 1 dose for all adults aged 46 years and older.  Meningococcal vaccine.** / Consult your health care provider.  Hepatitis A vaccine.** / Consult your health care provider.  Hepatitis B vaccine.** / Consult your health care provider.  Haemophilus influenzae type b (Hib) vaccine.** / Consult your health care provider. ** Family history and personal history of risk and conditions may change your health care provider's recommendations.   This information is not intended to replace advice given to you by your health care provider. Make sure you discuss any questions you have with your health care provider.   Document Released: 05/26/2001 Document Revised: 04/20/2014 Document Reviewed: 08/25/2010 Elsevier Interactive Patient Education [employer].

## 2015-09-02 NOTE — Assessment & Plan Note (Signed)
minimize simple carbs. Increase exercise as tolerated.  

## 2015-09-02 NOTE — Assessment & Plan Note (Signed)
Patient encouraged to maintain heart healthy diet, regular exercise, adequate sleep. Consider daily probiotics. Take medications as prescribed. Given and reviewed copy of ACP documents from [place] Secretary of State and encouraged to complete and return 

## 2015-09-02 NOTE — Assessment & Plan Note (Signed)
Had a bad flare but is feeling bett now

## 2015-09-02 NOTE — Progress Notes (Signed)
Pre visit review using our clinic review tool, if applicable. No additional management support is needed unless otherwise documented below in the visit note. 

## 2015-09-02 NOTE — Assessment & Plan Note (Signed)
Encouraged DASH diet, decrease po intake and increase exercise as tolerated. Needs 7-8 hours of sleep nightly. Avoid trans fats, eat small, frequent meals every 4-5 hours with lean proteins, complex carbs and healthy fats. Minimize simple carbs, GMO foods. 

## 2015-09-02 NOTE — Assessment & Plan Note (Signed)
Well controlled, no changes to meds. Encouraged heart healthy diet such as the DASH diet and exercise as tolerated.  °

## 2015-09-02 NOTE — Assessment & Plan Note (Signed)
Encouraged heart healthy diet, increase exercise, avoid trans fats, consider a krill oil cap daily 

## 2015-09-02 NOTE — Progress Notes (Signed)
Patient ID: Vicki Dennis, female   DOB: 06/27/1945, 71 y.o.   MRN: [id number]   Subjective:    Patient ID: Vicki Dennis, female    DOB: Feb 01, 1946, 70 y.o.   MRN: [id number]  Chief Complaint  Patient presents with  . Annual Exam    HPI Patient is in today for annual exam. She is doing well. No recent concerns. No hospitalizations. She did have an episode of asthma but her symptoms have resolved after a course of azithromycin. She continues to follow up with Dr Terri Piedra of dermatology. Is doing well with ADLs and has no concerns at home. Denies CP/palp/SOB/HA/congestion/fevers/GI or GU c/o. Taking meds as prescribed  Past Medical History  Diagnosis Date  . Hyperlipidemia   . Hypertension   . Portal vein thrombosis 03/09/2011    coumadin ended summer of 2013  . Asthma   . Blood transfusion without reported diagnosis   . GERD (gastroesophageal reflux disease)     rare use of OTC aid  . Anemia     h/o  . Pneumonia     as a child  . Diabetes mellitus     type-II, per Dr. Parke Simmers, no meds. , pt. reports weight loss has contributed  to control of diabetes.  She reports last HgbA1c- wnl.  . Diabetes mellitus 11/05/2010  . Chicken pox as a child  . Measles as a child  . Mumps as a child  . Diabetes mellitus type 2, uncontrolled (HCC) 11/05/2010  . Allergy   . Diabetes mellitus type 2 in obese (HCC) 11/05/2010  . Obesity 02/10/2014  . Esophageal reflux 02/10/2014  . Seborrheic keratosis 02/10/2014    Follows with dermatology  . Asthma, mild intermittent, well-controlled 02/10/2014  . Medicare annual wellness visit, subsequent 08/06/2014  . Preventative health care 08/12/2014  . Rash and nonspecific skin eruption 01/07/2015  . Hyperglycemia 09/02/2015    Past Surgical History  Procedure Laterality Date  . Knee arthroscopy w/ meniscal repair      both knees  . Tonsillectomy    . Umbilical hernia repair  03/16/2012    Procedure: HERNIA REPAIR UMBILICAL ADULT;  Surgeon: Shelly Rubenstein, MD;  Location: [facility] OR;  Service: General;  Laterality: N/A;  . Insertion of mesh  03/16/2012    Procedure: INSERTION OF MESH;  Surgeon: Shelly Rubenstein, MD;  Location: [facility] OR;  Service: General;  Laterality: N/A;  . Breast surgery      benign - cysts    Family History  Problem Relation Age of Onset  . Cancer Father     prostate  . Cancer Maternal Aunt   . Aneurysm Mother 4    brain    Social History   Social History  . Marital Status: Divorced    Spouse Name: N/A  . Number of Children: N/A  . Years of Education: N/A   Occupational History  . Not on file.   Social History Main Topics  . Smoking status: Never Smoker   . Smokeless tobacco: Never Used     Comment: never used tobacco  . Alcohol Use: 0.0 oz/week    0 Standard drinks or equivalent per week     Comment: for holidays  . Drug Use: No  . Sexual Activity: No     Comment: lives alone    Other Topics Concern  . Not on file   Social History Narrative    Outpatient Prescriptions Prior to Visit  Medication Sig Dispense Refill  .  aspirin 81 MG tablet Take 81 mg by mouth daily.     Marland Kitchen azelastine (ASTELIN) 0.1 % nasal spray Place 2 sprays into both nostrils 2 (two) times daily. Use in each nostril as directed 30 mL 3  . budesonide-formoterol (SYMBICORT) 80-4.5 MCG/ACT inhaler Inhale 1 puff into the lungs daily. Uses with seasonal allergies in the Spring and the Fall.    . cetirizine (ZYRTEC) 10 MG tablet Take 10 mg by mouth daily with breakfast.     . enalapril (VASOTEC) 2.5 MG tablet TAKE 1 TABLET(2.5 MG) BY MOUTH DAILY 90 tablet 2  . EPIPEN 2-PAK 0.3 MG/0.3ML SOAJ injection     . fluticasone (FLONASE) 50 MCG/ACT nasal spray Place 2 sprays into both nostrils daily. 16 g 6  . Lancets 30G MISC Use as directed once daily to check blood sugar.  Diagnosis code E11.9 100 each 5  . montelukast (SINGULAIR) 10 MG tablet Take 1 tablet (10 mg total) by mouth at bedtime. 30 tablet 5  . Multiple Vitamins-Minerals  (MULTIVITAMIN WITH MINERALS) tablet Take 1 tablet by mouth daily.      . Omega-3 Fatty Acids (FISH OIL CONCENTRATE) 1000 MG CAPS Take by mouth 3 (three) times daily.    Marland Kitchen PROVENTIL HFA 108 (90 BASE) MCG/ACT inhaler     . azithromycin (ZITHROMAX) 250 MG tablet Take 2 tablets by mouth on day 1, followed by 1 tablet by mouth daily for 4 days. 6 tablet 0  . glucose blood (ACCU-CHEK AVIVA PLUS) test strip Use as directed once daily to check blood sugar.  Diagnosis code E11.9 100 each 5  . ondansetron (ZOFRAN ODT) 8 MG disintegrating tablet Take 1 tablet (8 mg total) by mouth every 8 (eight) hours as needed for nausea or vomiting. 15 tablet 0   No facility-administered medications prior to visit.    Allergies  Allergen Reactions  . Codeine Other (See Comments)    "makes me crazy"  . Sulfa Antibiotics Anaphylaxis    "died 3 times".  . Oxycodone     Review of Systems  Constitutional: Negative for fever, chills and malaise/fatigue.  HENT: Negative for congestion and hearing loss.   Eyes: Negative for discharge.  Respiratory: Negative for cough, sputum production and shortness of breath.   Cardiovascular: Negative for chest pain, palpitations and leg swelling.  Gastrointestinal: Negative for heartburn, nausea, vomiting, abdominal pain, diarrhea, constipation and blood in stool.  Genitourinary: Negative for dysuria, urgency, frequency and hematuria.  Musculoskeletal: Negative for myalgias, back pain and falls.  Skin: Negative for rash.  Neurological: Negative for dizziness, sensory change, loss of consciousness, weakness and headaches.  Endo/Heme/Allergies: Negative for environmental allergies. Does not bruise/bleed easily.  Psychiatric/Behavioral: Negative for depression and suicidal ideas. The patient is not nervous/anxious and does not have insomnia.        Objective:    Physical Exam  Constitutional: She is oriented to person, place, and time. She appears well-developed and  well-nourished. No distress.  HENT:  Head: Normocephalic and atraumatic.  Eyes: Conjunctivae are normal.  Neck: Neck supple. No thyromegaly present.  Cardiovascular: Normal rate, regular rhythm and normal heart sounds.   No murmur heard. Pulmonary/Chest: Effort normal and breath sounds normal. No respiratory distress.  Abdominal: Soft. Bowel sounds are normal. She exhibits no distension and no mass. There is no tenderness.  Musculoskeletal: She exhibits no edema.  Lymphadenopathy:    She has no cervical adenopathy.  Neurological: She is alert and oriented to person, place, and time.  Skin: Skin  is warm and dry.  Psychiatric: She has a normal mood and affect. Her behavior is normal.    BP 118/72 mmHg  Pulse 79  Temp(Src) 98.1 F (36.7 C) (Oral)  Ht 5\' 6"  (1.676 m)  Wt 199 lb (90.266 kg)  BMI 32.13 kg/m2  SpO2 95% Wt Readings from Last 3 Encounters:  09/02/15 199 lb (90.266 kg)  07/16/15 198 lb (89.812 kg)  02/22/15 196 lb 12.8 oz (89.268 kg)     Lab Results  Component Value Date   WBC 7.5 09/02/2015   HGB 13.3 09/02/2015   HCT 41.0 09/02/2015   PLT 219.0 09/02/2015   GLUCOSE 95 09/02/2015   CHOL 208* 09/02/2015   TRIG 94.0 09/02/2015   HDL 54.20 09/02/2015   LDLDIRECT 111.2 02/06/2014   LDLCALC 135* 09/02/2015   ALT 14 09/02/2015   AST 15 09/02/2015   NA 139 09/02/2015   K 3.9 09/02/2015   CL 105 09/02/2015   CREATININE 0.67 09/02/2015   BUN 16 09/02/2015   CO2 27 09/02/2015   TSH 0.99 09/02/2015   INR 2.2 09/21/2011   HGBA1C 7.1* 09/02/2015   MICROALBUR 2.2* 02/06/2014    Lab Results  Component Value Date   TSH 0.99 09/02/2015   Lab Results  Component Value Date   WBC 7.5 09/02/2015   HGB 13.3 09/02/2015   HCT 41.0 09/02/2015   MCV 88.0 09/02/2015   PLT 219.0 09/02/2015   Lab Results  Component Value Date   NA 139 09/02/2015   K 3.9 09/02/2015   CHLORIDE 107 02/22/2015   CO2 27 09/02/2015   GLUCOSE 95 09/02/2015   BUN 16 09/02/2015    CREATININE 0.67 09/02/2015   BILITOT 0.5 09/02/2015   ALKPHOS 63 09/02/2015   AST 15 09/02/2015   ALT 14 09/02/2015   PROT 6.6 09/02/2015   ALBUMIN 4.2 09/02/2015   CALCIUM 9.7 09/02/2015   ANIONGAP 9 02/22/2015   EGFR 79* 02/22/2015   GFR 111.19 09/02/2015   Lab Results  Component Value Date   CHOL 208* 09/02/2015   Lab Results  Component Value Date   HDL 54.20 09/02/2015   Lab Results  Component Value Date   LDLCALC 135* 09/02/2015   Lab Results  Component Value Date   TRIG 94.0 09/02/2015   Lab Results  Component Value Date   CHOLHDL 4 09/02/2015   Lab Results  Component Value Date   HGBA1C 7.1* 09/02/2015       Assessment & Plan:   Problem List Items Addressed This Visit    Umbilical hernia    Asymptomatic at this time      Relevant Orders   Hemoglobin A1c (Completed)   TSH (Completed)   CBC (Completed)   Lipid panel (Completed)   Comprehensive metabolic panel (Completed)   Preventative health care    Patient encouraged to maintain heart healthy diet, regular exercise, adequate sleep. Consider daily probiotics. Take medications as prescribed. Given and reviewed copy of ACP documents from [facility] Secretary of State and encouraged to complete and return      Portal vein thrombosis (Chronic)   Relevant Orders   Hemoglobin A1c (Completed)   TSH (Completed)   CBC (Completed)   Lipid panel (Completed)   Comprehensive metabolic panel (Completed)   Obesity - Primary    Encouraged DASH diet, decrease po intake and increase exercise as tolerated. Needs 7-8 hours of sleep nightly. Avoid trans fats, eat small, frequent meals every 4-5 hours with lean proteins, complex carbs and healthy  fats. Minimize simple carbs, GMO foods.      Relevant Orders   Hemoglobin A1c (Completed)   TSH (Completed)   CBC (Completed)   Lipid panel (Completed)   Comprehensive metabolic panel (Completed)   Hyperlipidemia    Encouraged heart healthy diet, increase exercise, avoid  trans fats, consider a krill oil cap daily      Relevant Orders   Hemoglobin A1c (Completed)   TSH (Completed)   CBC (Completed)   Lipid panel (Completed)   Comprehensive metabolic panel (Completed)   Hyperglycemia     minimize simple carbs. Increase exercise as tolerated.       Relevant Orders   Hemoglobin A1c (Completed)   TSH (Completed)   CBC (Completed)   Lipid panel (Completed)   Comprehensive metabolic panel (Completed)   HTN (hypertension)    Well controlled, no changes to meds. Encouraged heart healthy diet such as the DASH diet and exercise as tolerated.       Relevant Orders   Hemoglobin A1c (Completed)   TSH (Completed)   CBC (Completed)   Lipid panel (Completed)   Comprehensive metabolic panel (Completed)   Diabetes mellitus type 2 in obese (HCC)    minimize simple carbs. Increase exercise as tolerated.       Relevant Orders   Hemoglobin A1c (Completed)   TSH (Completed)   CBC (Completed)   Lipid panel (Completed)   Comprehensive metabolic panel (Completed)   Asthma, mild intermittent, well-controlled    Had a bad flare but is feeling bett now        Other Visit Diagnoses    Cervical cancer screening        Relevant Orders    Ambulatory referral to Gynecology       I have discontinued Ms. Scovell's azithromycin. I am also having her maintain her budesonide-formoterol, multivitamin with minerals, cetirizine, aspirin, PROVENTIL HFA, FISH OIL CONCENTRATE, fluticasone, Lancets 30G, EPIPEN 2-PAK, montelukast, azelastine, enalapril, glucose blood, and ondansetron.  Meds ordered this encounter  Medications  . glucose blood (ACCU-CHEK AVIVA PLUS) test strip    Sig: Use as directed once daily to check blood sugar.  Diagnosis code E11.9    Dispense:  100 each    Refill:  5  . ondansetron (ZOFRAN ODT) 8 MG disintegrating tablet    Sig: Take 1 tablet (8 mg total) by mouth every 8 (eight) hours as needed for nausea or vomiting.    Dispense:  15 tablet     Refill:  0     Danise Edge, MD

## 2015-09-02 NOTE — Assessment & Plan Note (Signed)
Asymptomatic at this time 

## 2015-09-03 ENCOUNTER — Encounter: Payer: Self-pay | Admitting: *Deleted

## 2015-09-10 ENCOUNTER — Other Ambulatory Visit: Payer: Self-pay | Admitting: Family Medicine

## 2015-09-10 ENCOUNTER — Telehealth: Payer: Self-pay | Admitting: Family Medicine

## 2015-09-10 DIAGNOSIS — Z1239 Encounter for other screening for malignant neoplasm of breast: Secondary | ICD-10-CM

## 2015-09-10 NOTE — Telephone Encounter (Addendum)
Relation to [id number] Call back [place]:  Reason for call:  patient requesting mamo orders for [facility] [place], [place], [place] [address] [phone]

## 2015-09-17 ENCOUNTER — Telehealth: Payer: Self-pay | Admitting: Hematology & Oncology

## 2015-09-17 NOTE — Telephone Encounter (Signed)
Patient called and cx 09/26/15 apt and resch for 10/10/15

## 2015-09-18 ENCOUNTER — Other Ambulatory Visit: Payer: Self-pay | Admitting: Family Medicine

## 2015-09-18 MED ORDER — AZELASTINE HCL 0.1 % NA SOLN: 2.0000 | Freq: Two times a day (BID) | NASAL | Status: DC

## 2015-09-18 NOTE — Telephone Encounter (Signed)
°  Relationship to patient: Self  Can be reached: [phone]   Pharmacy:  [id number] - [place], [place] [phone] (Phone) [phone] (Fax)       Reason for call: refill azelastine (ASTELIN) 0.1 % nasal spray [id number] and  enalapril (VASOTEC) 2.5 MG tablet [id number]

## 2015-09-18 NOTE — Telephone Encounter (Signed)
Enalapril sent to pharmacy on 08/19/15. Sent denial to pharmacy and to use Rx on file. Refilled Azelastine. Attempted to notify pt and received voicemail; did not leave message.

## 2015-09-19 ENCOUNTER — Other Ambulatory Visit: Payer: Self-pay | Admitting: Family Medicine

## 2015-09-26 ENCOUNTER — Ambulatory Visit: Payer: Medicare Other | Admitting: Hematology & Oncology

## 2015-09-26 ENCOUNTER — Other Ambulatory Visit: Payer: Medicare Other

## 2015-09-30 ENCOUNTER — Other Ambulatory Visit: Payer: Self-pay | Admitting: Family Medicine

## 2015-09-30 DIAGNOSIS — Z1231 Encounter for screening mammogram for malignant neoplasm of breast: Secondary | ICD-10-CM

## 2015-10-10 ENCOUNTER — Encounter: Payer: Self-pay | Admitting: Hematology & Oncology

## 2015-10-10 ENCOUNTER — Ambulatory Visit (HOSPITAL_BASED_OUTPATIENT_CLINIC_OR_DEPARTMENT_OTHER): Payer: Medicare Other | Admitting: Hematology & Oncology

## 2015-10-10 ENCOUNTER — Other Ambulatory Visit (HOSPITAL_BASED_OUTPATIENT_CLINIC_OR_DEPARTMENT_OTHER): Payer: Medicare Other

## 2015-10-10 VITALS — BP 130/69 | HR 68 | Temp 97.8°F | Resp 20 | Ht 66.0 in | Wt 198.0 lb

## 2015-10-10 DIAGNOSIS — I81 Portal vein thrombosis: Secondary | ICD-10-CM | POA: Diagnosis not present

## 2015-10-10 DIAGNOSIS — E669 Obesity, unspecified: Secondary | ICD-10-CM

## 2015-10-10 DIAGNOSIS — E1169 Type 2 diabetes mellitus with other specified complication: Secondary | ICD-10-CM

## 2015-10-10 LAB — COMPREHENSIVE METABOLIC PANEL
ALBUMIN: 3.4 g/dL — AB (ref 3.5–5.0)
ALK PHOS: 68 U/L (ref 40–150)
ALT: 13 U/L (ref 0–55)
AST: 12 U/L (ref 5–34)
Anion Gap: 8 mEq/L (ref 3–11)
BUN: 16.3 mg/dL (ref 7.0–26.0)
CHLORIDE: 110 meq/L — AB (ref 98–109)
CO2: 24 meq/L (ref 22–29)
Calcium: 9.4 mg/dL (ref 8.4–10.4)
Creatinine: 0.8 mg/dL (ref 0.6–1.1)
EGFR: 86 mL/min/{1.73_m2} — AB (ref >90)
GLUCOSE: 100 mg/dL (ref 70–140)
POTASSIUM: 4.2 meq/L (ref 3.5–5.1)
SODIUM: 142 meq/L (ref 136–145)
Total Bilirubin: <0.3 mg/dL (ref 0.20–1.20)
Total Protein: 6.4 g/dL (ref 6.4–8.3)

## 2015-10-10 LAB — CBC WITH DIFFERENTIAL (CANCER CENTER ONLY)
BASO#: 0.1 10*3/uL (ref 0.0–0.2)
BASO%: 1.4 % (ref 0.0–2.0)
EOS ABS: 0.3 10*3/uL (ref 0.0–0.5)
EOS%: 4.7 % (ref 0.0–7.0)
HCT: 40.4 % (ref 34.8–46.6)
HGB: 13.3 g/dL (ref 11.6–15.9)
LYMPH#: 1.5 10*3/uL (ref 0.9–3.3)
LYMPH%: 26.1 % (ref 14.0–48.0)
MCH: 29.4 pg (ref 26.0–34.0)
MCHC: 32.9 g/dL (ref 32.0–36.0)
MCV: 89 fL (ref 81–101)
MONO#: 0.5 10*3/uL (ref 0.1–0.9)
MONO%: 9 % (ref 0.0–13.0)
NEUT#: 3.5 10*3/uL (ref 1.5–6.5)
NEUT%: 58.8 % (ref 39.6–80.0)
PLATELETS: 214 10*3/uL (ref 145–400)
RBC: 4.52 10*6/uL (ref 3.70–5.32)
RDW: 14.2 % (ref 11.1–15.7)
WBC: 5.9 10*3/uL (ref 3.9–10.0)

## 2015-10-10 NOTE — Progress Notes (Signed)
.  Hematology and Oncology Follow Up Visit  KAMIYA GREANY [id number] 1945-07-30 70 y.o. 10/10/2015   Principle Diagnosis:   Idiopathic portal vein thrombosis  Current Therapy:    Aspirin 162 mg by mouth daily     Interim History:  Ms.  Baez is back for followup. She is doing quite well. She is having some house issues. Apparently, there was some low-quality work done by [employer]. This causes some leaking in her house. She has to get this fixed.  She's had no abdominal pain. She has a hernia. She was a corset. This helps.  She's had no change in bowel or bladder habits.  She's had no cough. She has a little bit of seasonal allergies. She takes over-the-counter medicine for this.   There is no fever. There is no bleeding. She's had no bruising. There is no leg swelling.   Overall, her performance status is ECOG 0.   She probably is going go back to [place] next year.   Medications:  Current outpatient prescriptions:  .  aspirin 81 MG tablet, Take 81 mg by mouth daily. , Disp: , Rfl:  .  azelastine (ASTELIN) 0.1 % nasal spray, Place 2 sprays into both nostrils 2 (two) times daily. Use in each nostril as directed, Disp: 30 mL, Rfl: 3 .  budesonide-formoterol (SYMBICORT) 80-4.5 MCG/ACT inhaler, Inhale 1 puff into the lungs daily. Uses with seasonal allergies in the Spring and the Fall., Disp: , Rfl:  .  cetirizine (ZYRTEC) 10 MG tablet, Take 10 mg by mouth daily with breakfast. , Disp: , Rfl:  .  enalapril (VASOTEC) 2.5 MG tablet, TAKE 1 TABLET(2.5 MG) BY MOUTH DAILY, Disp: 90 tablet, Rfl: 2 .  enalapril (VASOTEC) 2.5 MG tablet, TAKE 1 TABLET(2.5 MG) BY MOUTH DAILY, Disp: 30 tablet, Rfl: 0 .  EPIPEN 2-PAK 0.3 MG/0.3ML SOAJ injection, , Disp: , Rfl:  .  fluticasone (FLONASE) 50 MCG/ACT nasal spray, Place 2 sprays into both nostrils daily., Disp: 16 g, Rfl: 6 .  glucose blood (ACCU-CHEK AVIVA PLUS) test strip, Use as directed once daily to check blood sugar.  Diagnosis code  E11.9, Disp: 100 each, Rfl: 5 .  Lancets 30G MISC, Use as directed once daily to check blood sugar.  Diagnosis code E11.9, Disp: 100 each, Rfl: 5 .  montelukast (SINGULAIR) 10 MG tablet, Take 1 tablet (10 mg total) by mouth at bedtime., Disp: 30 tablet, Rfl: 5 .  Multiple Vitamins-Minerals (MULTIVITAMIN WITH MINERALS) tablet, Take 1 tablet by mouth daily.  , Disp: , Rfl:  .  Omega-3 Fatty Acids (FISH OIL CONCENTRATE) 1000 MG CAPS, Take by mouth 3 (three) times daily., Disp: , Rfl:  .  ondansetron (ZOFRAN ODT) 8 MG disintegrating tablet, Take 1 tablet (8 mg total) by mouth every 8 (eight) hours as needed for nausea or vomiting., Disp: 15 tablet, Rfl: 0 .  PROVENTIL HFA 108 (90 BASE) MCG/ACT inhaler, , Disp: , Rfl:   Allergies:  Allergies  Allergen Reactions  . Codeine Other (See Comments)    "makes me crazy"  . Sulfa Antibiotics Anaphylaxis    "died 3 times".  . Oxycodone     Past Medical History, Surgical history, Social history, and Family History were reviewed and updated.  Review of Systems: As above  Physical Exam:  height is 5\' 6"  (1.676 m) and weight is 198 lb (89.812 kg). Her oral temperature is 97.8 F (36.6 C). Her blood pressure is 130/69 and her pulse is 68. Her respiration is  20.   Well-developed and well-nourished African American female. Head and neck exam shows no ocular or oral lesions. She has no palpable cervical or supraclavicular lymph nodes. Lungs are clear bilaterally. Cardiac exam shows a regular rate and rhythm with no murmurs rubs or bruits. Abdomen is soft. Has good bowel sounds. She does have a small anterior abdominal wall hernia. This is down by the umbilicus. It is not tender. It is reducible. There is no palpable abdominal mass. There is no fluid wave. There is no palpable liver or spleen tip. Extremities shows no clubbing cyanosis or edema. She has good range of motion of her joints. She has good strength in her extremities Neurological exam shows no focal  neurological deficits. Skin exam no rashes ecchymoses or petechia.  Lab Results  Component Value Date   WBC 5.9 10/10/2015   HGB 13.3 10/10/2015   HCT 40.4 10/10/2015   MCV 89 10/10/2015   PLT 214 10/10/2015     Chemistry      Component Value Date/Time   NA 139 09/02/2015 1105   NA 139 02/22/2015 0922   NA 142 06/08/2014 0958   K 3.9 09/02/2015 1105   K 4.3 02/22/2015 0922   K 4.2 06/08/2014 0958   CL 105 09/02/2015 1105   CL 104 06/08/2014 0958   CO2 27 09/02/2015 1105   CO2 23 02/22/2015 0922   CO2 26 06/08/2014 0958   BUN 16 09/02/2015 1105   BUN 20.2 02/22/2015 0922   BUN 17 06/08/2014 0958   CREATININE 0.67 09/02/2015 1105   CREATININE 0.9 02/22/2015 0922   CREATININE 0.7 06/08/2014 0958      Component Value Date/Time   CALCIUM 9.7 09/02/2015 1105   CALCIUM 9.7 02/22/2015 0922   CALCIUM 9.3 06/08/2014 0958   ALKPHOS 63 09/02/2015 1105   ALKPHOS 74 02/22/2015 0922   ALKPHOS 57 06/08/2014 0958   AST 15 09/02/2015 1105   AST 15 02/22/2015 0922   AST 16 06/08/2014 0958   ALT 14 09/02/2015 1105   ALT 15 02/22/2015 0922   ALT 14 06/08/2014 0958   BILITOT 0.5 09/02/2015 1105   BILITOT 0.52 02/22/2015 0922   BILITOT 0.70 06/08/2014 0958         Impression and Plan: Ms. Desmidt is 70 year old female with an idiopathic portal vein thrombus. She was treated with anticoagulation with Coumadin. She did well. She was on blood thinner for one year.  We have her on aspirin. She's doing well with the aspirin. I see no problems with continuing her on aspirin.  I and was having to talk with her. She is very interesting that it was has a lot to say. She's been very busy. It sounds like her house needs a little work and that she is taking care of this.  Otherwise,  I'll plan to get her back in about 6 months time.   [name], MD 6/29/201711:58 AM

## 2015-10-16 ENCOUNTER — Other Ambulatory Visit: Payer: Self-pay | Admitting: Family Medicine

## 2015-10-16 NOTE — Telephone Encounter (Signed)
Pt is requesting refill on Zofran.  Last OV: 09/02/2015 Last Fill: 09/02/2015 #15 and 0RF Pt sig: 1 tab q8h prn  Please advise.

## 2015-10-16 NOTE — Telephone Encounter (Signed)
Sent to pharmacy 

## 2015-10-17 ENCOUNTER — Encounter: Payer: Self-pay | Admitting: Obstetrics & Gynecology

## 2015-10-17 ENCOUNTER — Ambulatory Visit (INDEPENDENT_AMBULATORY_CARE_PROVIDER_SITE_OTHER): Payer: Medicare Other | Admitting: Obstetrics & Gynecology

## 2015-10-17 VITALS — BP 137/80 | HR 73 | Ht 66.0 in | Wt 196.0 lb

## 2015-10-17 DIAGNOSIS — Z01419 Encounter for gynecological examination (general) (routine) without abnormal findings: Secondary | ICD-10-CM

## 2015-10-17 DIAGNOSIS — R110 Nausea: Secondary | ICD-10-CM

## 2015-10-17 MED ORDER — ONDANSETRON 8 MG PO TBDP: 8.0000 mg | ORAL_TABLET | Freq: Three times a day (TID) | ORAL | Status: DC | PRN

## 2015-10-17 NOTE — Progress Notes (Signed)
Patient ID: Vicki Dennis, female   DOB: [birth year], 70 y.o.   MRN: [id number] Subjective:     Vicki Dennis is a 70 y.o. female here for a routine exam.  LMP age 66-48 years. Current complaints: none.  For routine screening for cervical cancer. Pt has never had an abnormal PAP smear.  Pt is not sexually active.  Lifetime sexual partners: 2  Was married for 12 years. First partner was prior to her marriage. No bleeding normal.    Pt travels to [place] for tours.  She lived in [place] and worked with the [employer].  Attended [employer] college.   Gynecologic History No LMP recorded. Patient is postmenopausal. Contraception: post menopausal status Last Pap: 2010. Results were: normal Last mammogram: 01/2014. Results were: fibrocystic breasts  Obstetric History OB History  No data available   The following portions of the patient's history were reviewed and updated as appropriate: allergies, current medications, past family history, past medical history, past social history, past surgical history and problem list.  Review of Systems Pertinent items are noted in HPI.    Objective:  BP 137/80 mmHg  Pulse 73  Ht 5\' 6"  (1.676 m)  Wt 196 lb (88.905 kg)  BMI 31.65 kg/m2 General Appearance:    Alert, cooperative, no distress, appears stated age  Head:    Normocephalic, without obvious abnormality, atraumatic  Eyes:    conjunctiva/corneas clear, EOM's intact, both eyes  Ears:    Normal external ear canals, both ears  Nose:   Nares normal, septum midline, mucosa normal, no drainage    or sinus tenderness  Throat:   Lips, mucosa, and tongue normal; teeth and gums normal  Neck:   Supple, symmetrical, trachea midline, no adenopathy;    thyroid:  no enlargement/tenderness/nodules  Back:     Symmetric, no curvature, ROM normal, no CVA tenderness  Lungs:     Clear to auscultation bilaterally, respirations unlabored  Chest Wall:    No tenderness or deformity   Heart:    Regular rate and rhythm, S1  and S2 normal, no murmur, rub   or gallop  Breast Exam:    No tenderness, masses, or nipple abnormality  Abdomen:     Soft, non-tender, bowel sounds active all four quadrants,    no masses, no organomegaly  Genitalia:    Normal female without lesion, discharge or tenderness     Extremities:   Extremities normal, atraumatic, no cyanosis or edema  Pulses:   2+ and symmetric all extremities  Skin:   Skin color, texture, turgor normal, no rashes or lesions       Assessment:    Healthy female exam.   Cervical cancer screening- reviewed with pt the current screening guidelines for cervical cancer.  She is low risk and can stop this screening.  rec continued 1-2 year annual exam for GYN exam (without a PAP).     Plan:    Education reviewed: cervical cancer screening.    Refilled zofran tablets  [name] L. Harraway-Smith, M.D., Cherlynn June

## 2015-10-24 ENCOUNTER — Other Ambulatory Visit: Payer: Self-pay | Admitting: Family Medicine

## 2015-10-31 ENCOUNTER — Ambulatory Visit
Admission: RE | Admit: 2015-10-31 | Discharge: 2015-10-31 | Disposition: A | Discharge status: Home / Self Care | Payer: Medicare Other | Source: Ambulatory Visit | Attending: Family Medicine | Admitting: Family Medicine

## 2015-10-31 DIAGNOSIS — Z1231 Encounter for screening mammogram for malignant neoplasm of breast: Secondary | ICD-10-CM

## 2015-12-20 ENCOUNTER — Encounter: Payer: Self-pay | Admitting: Family Medicine

## 2015-12-20 ENCOUNTER — Ambulatory Visit (INDEPENDENT_AMBULATORY_CARE_PROVIDER_SITE_OTHER): Payer: Medicare Other | Admitting: Family Medicine

## 2015-12-20 VITALS — BP 108/70 | HR 77 | Temp 98.5°F | Ht 66.0 in | Wt 193.5 lb

## 2015-12-20 DIAGNOSIS — E1169 Type 2 diabetes mellitus with other specified complication: Secondary | ICD-10-CM

## 2015-12-20 DIAGNOSIS — R739 Hyperglycemia, unspecified: Secondary | ICD-10-CM

## 2015-12-20 DIAGNOSIS — K219 Gastro-esophageal reflux disease without esophagitis: Secondary | ICD-10-CM

## 2015-12-20 DIAGNOSIS — E785 Hyperlipidemia, unspecified: Secondary | ICD-10-CM | POA: Diagnosis not present

## 2015-12-20 DIAGNOSIS — E669 Obesity, unspecified: Secondary | ICD-10-CM

## 2015-12-20 DIAGNOSIS — Z23 Encounter for immunization: Secondary | ICD-10-CM

## 2015-12-20 DIAGNOSIS — I10 Essential (primary) hypertension: Secondary | ICD-10-CM

## 2015-12-20 DIAGNOSIS — J4520 Mild intermittent asthma, uncomplicated: Secondary | ICD-10-CM

## 2015-12-20 DIAGNOSIS — E119 Type 2 diabetes mellitus without complications: Secondary | ICD-10-CM

## 2015-12-20 LAB — COMPREHENSIVE METABOLIC PANEL
ALK PHOS: 62 U/L (ref 39–117)
ALT: 15 U/L (ref 0–35)
AST: 14 U/L (ref 0–37)
Albumin: 4 g/dL (ref 3.5–5.2)
BUN: 19 mg/dL (ref 6–23)
CHLORIDE: 107 meq/L (ref 96–112)
CO2: 26 mEq/L (ref 19–32)
Calcium: 9.1 mg/dL (ref 8.4–10.5)
Creatinine, Ser: 0.69 mg/dL (ref 0.40–1.20)
GFR: 107.39 mL/min (ref >60.00)
GLUCOSE: 110 mg/dL — AB (ref 70–99)
POTASSIUM: 3.9 meq/L (ref 3.5–5.1)
Sodium: 138 mEq/L (ref 135–145)
TOTAL PROTEIN: 6.5 g/dL (ref 6.0–8.3)
Total Bilirubin: 0.5 mg/dL (ref 0.2–1.2)

## 2015-12-20 LAB — LIPID PANEL
CHOL/HDL RATIO: 3
Cholesterol: 203 mg/dL — ABNORMAL HIGH (ref 0–200)
HDL: 60 mg/dL (ref >39.00)
LDL CALC: 123 mg/dL — AB (ref 0–99)
NONHDL: 142.61
Triglycerides: 97 mg/dL (ref 0.0–149.0)
VLDL: 19.4 mg/dL (ref 0.0–40.0)

## 2015-12-20 LAB — CBC
HCT: 40.7 % (ref 36.0–46.0)
Hemoglobin: 13.4 g/dL (ref 12.0–15.0)
MCHC: 32.9 g/dL (ref 30.0–36.0)
MCV: 87.7 fl (ref 78.0–100.0)
Platelets: 190 K/uL (ref 150.0–400.0)
RBC: 4.64 Mil/uL (ref 3.87–5.11)
RDW: 14.9 % (ref 11.5–15.5)
WBC: 5.9 K/uL (ref 4.0–10.5)

## 2015-12-20 LAB — TSH: TSH: 0.98 u[IU]/mL (ref 0.35–4.50)

## 2015-12-20 LAB — HEMOGLOBIN A1C: Hgb A1c MFr Bld: 6.7 % — ABNORMAL HIGH (ref 4.6–6.5)

## 2015-12-20 MED ORDER — ENALAPRIL MALEATE 2.5 MG PO TABS: 2.5000 mg | ORAL_TABLET | Freq: Every day | ORAL | 2 refills | Status: DC

## 2015-12-20 MED ORDER — MONTELUKAST SODIUM 10 MG PO TABS: 10.0000 mg | ORAL_TABLET | Freq: Every day | ORAL | 3 refills | Status: DC

## 2015-12-20 MED ORDER — AZELASTINE HCL 0.1 % NA SOLN: 2.0000 | Freq: Two times a day (BID) | NASAL | 6 refills | Status: DC

## 2015-12-20 NOTE — Progress Notes (Signed)
Patient ID: Vicki Dennis, female   DOB: [birth year], 70 y.o.   MRN: [id number]   Subjective:    Patient ID: Vicki Dennis, female    DOB: 1945/08/04, 70 y.o.   MRN: [id number]  Chief Complaint  Patient presents with  . Follow-up    HPI Patient is in today for follow up. Doing well today. Is going to get her flu shot and pneumonia shot at pharmacy next month. No recent illness or acute concerns or hospitalization. Denies CP/palp/SOB/HA/congestion/fevers/GI or GU c/o. Taking meds as prescribed  Past Medical History:  Diagnosis Date  . Allergy   . Anemia    h/o  . Asthma   . Asthma, mild intermittent, well-controlled 02/10/2014  . Blood transfusion without reported diagnosis   . Chicken pox as a child  . Diabetes mellitus    type-II, per Dr. Parke Simmers, no meds. , pt. reports weight loss has contributed  to control of diabetes.  She reports last HgbA1c- wnl.  . Diabetes mellitus 11/05/2010  . Diabetes mellitus type 2 in obese (HCC) 11/05/2010  . Diabetes mellitus type 2, uncontrolled (HCC) 11/05/2010  . Esophageal reflux 02/10/2014  . GERD (gastroesophageal reflux disease)    rare use of OTC aid  . Hyperglycemia 09/02/2015  . Hyperlipidemia   . Hypertension   . Measles as a child  . Medicare annual wellness visit, subsequent 08/06/2014  . Mumps as a child  . Obesity 02/10/2014  . Pneumonia    as a child  . Portal vein thrombosis 03/09/2011   coumadin ended summer of 2013  . Preventative health care 08/12/2014  . Rash and nonspecific skin eruption 01/07/2015  . Seborrheic keratosis 02/10/2014   Follows with dermatology    Past Surgical History:  Procedure Laterality Date  . BREAST SURGERY     benign - cysts  . INSERTION OF MESH  03/16/2012   Procedure: INSERTION OF MESH;  Surgeon: Shelly Rubenstein, MD;  Location: [facility] OR;  Service: General;  Laterality: N/A;  . KNEE ARTHROSCOPY W/ MENISCAL REPAIR     both knees  . TONSILLECTOMY    . UMBILICAL HERNIA REPAIR  03/16/2012   Procedure: HERNIA REPAIR UMBILICAL ADULT;  Surgeon: Shelly Rubenstein, MD;  Location: [facility] OR;  Service: General;  Laterality: N/A;    Family History  Problem Relation Age of Onset  . Cancer Father     prostate  . Cancer Maternal Aunt   . Aneurysm Mother 14    brain    Social History   Social History  . Marital status: Divorced    Spouse name: N/A  . Number of children: N/A  . Years of education: N/A   Occupational History  . Not on file.   Social History Main Topics  . Smoking status: Never Smoker  . Smokeless tobacco: Never Used     Comment: never used tobacco  . Alcohol use 0.0 oz/week     Comment: for holidays  . Drug use: No  . Sexual activity: No     Comment: lives alone    Other Topics Concern  . Not on file   Social History Narrative  . No narrative on file    Outpatient Medications Prior to Visit  Medication Sig Dispense Refill  . aspirin 81 MG tablet Take 81 mg by mouth daily.     . budesonide-formoterol (SYMBICORT) 80-4.5 MCG/ACT inhaler Inhale 1 puff into the lungs daily. Uses with seasonal allergies in the Spring and the Fall.    Marland Kitchen  cetirizine (ZYRTEC) 10 MG tablet Take 10 mg by mouth daily with breakfast.     . enalapril (VASOTEC) 2.5 MG tablet TAKE 1 TABLET(2.5 MG) BY MOUTH DAILY 90 tablet 2  . EPIPEN 2-PAK 0.3 MG/0.3ML SOAJ injection     . fluticasone (FLONASE) 50 MCG/ACT nasal spray Place 2 sprays into both nostrils daily. 16 g 6  . glucose blood (ACCU-CHEK AVIVA PLUS) test strip Use as directed once daily to check blood sugar.  Diagnosis code E11.9 100 each 5  . Lancets 30G MISC Use as directed once daily to check blood sugar.  Diagnosis code E11.9 100 each 5  . Multiple Vitamins-Minerals (MULTIVITAMIN WITH MINERALS) tablet Take 1 tablet by mouth daily.      . Omega-3 Fatty Acids (FISH OIL CONCENTRATE) 1000 MG CAPS Take by mouth 3 (three) times daily.    . ondansetron (ZOFRAN-ODT) 8 MG disintegrating tablet Take 1 tablet (8 mg total) by mouth every  8 (eight) hours as needed for nausea or vomiting. 15 tablet 0  . PROVENTIL HFA 108 (90 BASE) MCG/ACT inhaler     . azelastine (ASTELIN) 0.1 % nasal spray Place 2 sprays into both nostrils 2 (two) times daily. Use in each nostril as directed 30 mL 3  . enalapril (VASOTEC) 2.5 MG tablet TAKE 1 TABLET(2.5 MG) BY MOUTH DAILY 90 tablet 0  . montelukast (SINGULAIR) 10 MG tablet Take 1 tablet (10 mg total) by mouth at bedtime. 30 tablet 5   No facility-administered medications prior to visit.     Allergies  Allergen Reactions  . Codeine Other (See Comments)    "makes me crazy"  . Sulfa Antibiotics Anaphylaxis    "died 3 times".  . Oxycodone     Review of Systems  Constitutional: Negative for fever and malaise/fatigue.  HENT: Negative for congestion.   Eyes: Negative for blurred vision.  Respiratory: Negative for shortness of breath.   Cardiovascular: Negative for chest pain, palpitations and leg swelling.  Gastrointestinal: Negative for abdominal pain, blood in stool and nausea.  Genitourinary: Negative for dysuria and frequency.  Musculoskeletal: Negative for falls.  Skin: Negative for rash.  Neurological: Negative for dizziness, loss of consciousness and headaches.  Endo/Heme/Allergies: Negative for environmental allergies.  Psychiatric/Behavioral: Negative for depression. The patient is not nervous/anxious.        Objective:    Physical Exam  Constitutional: She is oriented to person, place, and time. She appears well-developed and well-nourished. No distress.  HENT:  Head: Normocephalic and atraumatic.  Nose: Nose normal.  Eyes: Right eye exhibits no discharge. Left eye exhibits no discharge.  Neck: Normal range of motion. Neck supple.  Cardiovascular: Normal rate and regular rhythm.   No murmur heard. Pulmonary/Chest: Effort normal and breath sounds normal.  Abdominal: Soft. Bowel sounds are normal. There is no tenderness.  Musculoskeletal: She exhibits no edema.    Neurological: She is alert and oriented to person, place, and time.  Skin: Skin is warm and dry.  Psychiatric: She has a normal mood and affect.  Nursing note and vitals reviewed.   BP 108/70 (BP Location: Left Arm, Patient Position: Sitting, Cuff Size: Large)   Pulse 77   Temp 98.5 F (36.9 C) (Oral)   Ht 5\' 6"  (1.676 m)   Wt 193 lb 8 oz (87.8 kg)   SpO2 95%   BMI 31.23 kg/m  Wt Readings from Last 3 Encounters:  12/20/15 193 lb 8 oz (87.8 kg)  10/17/15 196 lb (88.9 kg)  10/10/15 198  lb (89.8 kg)     Lab Results  Component Value Date   WBC 5.9 10/10/2015   HGB 13.3 10/10/2015   HCT 40.4 10/10/2015   PLT 214 10/10/2015   GLUCOSE 100 10/10/2015   CHOL 208 (H) 09/02/2015   TRIG 94.0 09/02/2015   HDL 54.20 09/02/2015   LDLDIRECT 111.2 02/06/2014   LDLCALC 135 (H) 09/02/2015   ALT 13 10/10/2015   AST 12 10/10/2015   NA 142 10/10/2015   K 4.2 10/10/2015   CL 105 09/02/2015   CREATININE 0.8 10/10/2015   BUN 16.3 10/10/2015   CO2 24 10/10/2015   TSH 0.99 09/02/2015   INR 2.2 09/21/2011   HGBA1C 7.1 (H) 09/02/2015   MICROALBUR 2.2 (H) 02/06/2014    Lab Results  Component Value Date   TSH 0.99 09/02/2015   Lab Results  Component Value Date   WBC 5.9 10/10/2015   HGB 13.3 10/10/2015   HCT 40.4 10/10/2015   MCV 89 10/10/2015   PLT 214 10/10/2015   Lab Results  Component Value Date   NA 142 10/10/2015   K 4.2 10/10/2015   CHLORIDE 110 (H) 10/10/2015   CO2 24 10/10/2015   GLUCOSE 100 10/10/2015   BUN 16.3 10/10/2015   CREATININE 0.8 10/10/2015   BILITOT <0.30 10/10/2015   ALKPHOS 68 10/10/2015   AST 12 10/10/2015   ALT 13 10/10/2015   PROT 6.4 10/10/2015   ALBUMIN 3.4 (L) 10/10/2015   CALCIUM 9.4 10/10/2015   ANIONGAP 8 10/10/2015   EGFR 86 (L) 10/10/2015   GFR 111.19 09/02/2015   Lab Results  Component Value Date   CHOL 208 (H) 09/02/2015   Lab Results  Component Value Date   HDL 54.20 09/02/2015   Lab Results  Component Value Date    LDLCALC 135 (H) 09/02/2015   Lab Results  Component Value Date   TRIG 94.0 09/02/2015   Lab Results  Component Value Date   CHOLHDL 4 09/02/2015   Lab Results  Component Value Date   HGBA1C 7.1 (H) 09/02/2015       Assessment & Plan:   Problem List Items Addressed This Visit    Diabetes mellitus type 2 in obese (HCC)    hgba1c acceptable, minimize simple carbs. Increase exercise as tolerated. Continue current meds      Relevant Medications   enalapril (VASOTEC) 2.5 MG tablet   HTN (hypertension)    Well controlled, no changes to meds. Encouraged heart healthy diet such as the DASH diet and exercise as tolerated.       Relevant Medications   enalapril (VASOTEC) 2.5 MG tablet   Hyperlipidemia    Encouraged heart healthy diet, increase exercise, avoid trans fats, consider a krill oil cap daily      Relevant Medications   enalapril (VASOTEC) 2.5 MG tablet   Obesity - Primary    Encouraged DASH diet, decrease po intake and increase exercise as tolerated. Needs 7-8 hours of sleep nightly. Avoid trans fats, eat small, frequent meals every 4-5 hours with lean proteins, complex carbs and healthy fats. Minimize simple carbs      Esophageal reflux    Avoid offending foods, start probiotics. Do not eat large meals in late evening and consider raising head of bed.       Asthma, mild intermittent, well-controlled   Relevant Medications   montelukast (SINGULAIR) 10 MG tablet   RESOLVED: Hyperglycemia    Other Visit Diagnoses   None.     I have changed  Ms. Boudwin enalapril. I am also having her maintain her budesonide-formoterol, multivitamin with minerals, cetirizine, aspirin, PROVENTIL HFA, FISH OIL CONCENTRATE, fluticasone, Lancets 30G, EPIPEN 2-PAK, enalapril, glucose blood, ondansetron, montelukast, and azelastine.  Meds ordered this encounter  Medications  . montelukast (SINGULAIR) 10 MG tablet    Sig: Take 1 tablet (10 mg total) by mouth at bedtime.    Dispense:   90 tablet    Refill:  3  . enalapril (VASOTEC) 2.5 MG tablet    Sig: Take 1 tablet (2.5 mg total) by mouth daily.    Dispense:  90 tablet    Refill:  2    **Patient requests 90 days supply**  . azelastine (ASTELIN) 0.1 % nasal spray    Sig: Place 2 sprays into both nostrils 2 (two) times daily. Use in each nostril as directed    Dispense:  30 mL    Refill:  6     Danise Edge, MD

## 2015-12-20 NOTE — Assessment & Plan Note (Signed)
Well controlled, no changes to meds. Encouraged heart healthy diet such as the DASH diet and exercise as tolerated.  °

## 2015-12-20 NOTE — Assessment & Plan Note (Signed)
Encouraged heart healthy diet, increase exercise, avoid trans fats, consider a krill oil cap daily 

## 2015-12-20 NOTE — Progress Notes (Signed)
Pre visit review using our clinic review tool, if applicable. No additional management support is needed unless otherwise documented below in the visit note. 

## 2015-12-20 NOTE — Patient Instructions (Signed)

## 2015-12-20 NOTE — Assessment & Plan Note (Signed)
hgba1c acceptable, minimize simple carbs. Increase exercise as tolerated. Continue current meds 

## 2015-12-20 NOTE — Assessment & Plan Note (Signed)
Encouraged DASH diet, decrease po intake and increase exercise as tolerated. Needs 7-8 hours of sleep nightly. Avoid trans fats, eat small, frequent meals every 4-5 hours with lean proteins, complex carbs and healthy fats. Minimize simple carbs 

## 2015-12-20 NOTE — Assessment & Plan Note (Signed)
Avoid offending foods, start probiotics. Do not eat large meals in late evening and consider raising head of bed.  

## 2015-12-30 ENCOUNTER — Other Ambulatory Visit: Payer: Self-pay | Admitting: Family Medicine

## 2016-03-20 ENCOUNTER — Ambulatory Visit: Payer: Medicare Other | Admitting: Family Medicine

## 2016-03-20 ENCOUNTER — Ambulatory Visit: Payer: Medicare Other | Admitting: Medical

## 2016-03-23 ENCOUNTER — Ambulatory Visit (INDEPENDENT_AMBULATORY_CARE_PROVIDER_SITE_OTHER): Payer: Medicare Other | Admitting: Family Medicine

## 2016-03-23 ENCOUNTER — Other Ambulatory Visit: Payer: Self-pay | Admitting: Family Medicine

## 2016-03-23 ENCOUNTER — Telehealth: Payer: Self-pay | Admitting: Family Medicine

## 2016-03-23 ENCOUNTER — Encounter: Payer: Self-pay | Admitting: Family Medicine

## 2016-03-23 VITALS — BP 122/78 | HR 71 | Temp 98.2°F | Wt 192.8 lb

## 2016-03-23 DIAGNOSIS — H547 Unspecified visual loss: Secondary | ICD-10-CM | POA: Diagnosis not present

## 2016-03-23 DIAGNOSIS — J019 Acute sinusitis, unspecified: Secondary | ICD-10-CM

## 2016-03-23 DIAGNOSIS — I10 Essential (primary) hypertension: Secondary | ICD-10-CM | POA: Diagnosis not present

## 2016-03-23 DIAGNOSIS — E669 Obesity, unspecified: Secondary | ICD-10-CM

## 2016-03-23 DIAGNOSIS — E1169 Type 2 diabetes mellitus with other specified complication: Secondary | ICD-10-CM | POA: Diagnosis not present

## 2016-03-23 DIAGNOSIS — E782 Mixed hyperlipidemia: Secondary | ICD-10-CM

## 2016-03-23 DIAGNOSIS — J329 Chronic sinusitis, unspecified: Secondary | ICD-10-CM | POA: Insufficient documentation

## 2016-03-23 HISTORY — DX: Acute sinusitis, unspecified: J01.90

## 2016-03-23 LAB — CBC WITH DIFFERENTIAL/PLATELET
BASOS ABS: 0.1 10*3/uL (ref 0.0–0.1)
BASOS PCT: 0.8 % (ref 0.0–3.0)
Eosinophils Absolute: 0.3 10*3/uL (ref 0.0–0.7)
Eosinophils Relative: 3.4 % (ref 0.0–5.0)
HEMATOCRIT: 42.6 % (ref 36.0–46.0)
Hemoglobin: 14.1 g/dL (ref 12.0–15.0)
LYMPHS ABS: 2.5 10*3/uL (ref 0.7–4.0)
Lymphocytes Relative: 29.2 % (ref 12.0–46.0)
MCHC: 33.2 g/dL (ref 30.0–36.0)
MCV: 87.2 fl (ref 78.0–100.0)
MONOS PCT: 6.9 % (ref 3.0–12.0)
Monocytes Absolute: 0.6 10*3/uL (ref 0.1–1.0)
NEUTROS ABS: 5.2 10*3/uL (ref 1.4–7.7)
NEUTROS PCT: 59.7 % (ref 43.0–77.0)
PLATELETS: 198 10*3/uL (ref 150.0–400.0)
RBC: 4.88 Mil/uL (ref 3.87–5.11)
RDW: 14.3 % (ref 11.5–15.5)
WBC: 8.6 10*3/uL (ref 4.0–10.5)

## 2016-03-23 LAB — TSH: TSH: 0.72 u[IU]/mL (ref 0.35–4.50)

## 2016-03-23 LAB — COMPREHENSIVE METABOLIC PANEL
ALBUMIN: 4.1 g/dL (ref 3.5–5.2)
ALK PHOS: 64 U/L (ref 39–117)
ALT: 16 U/L (ref 0–35)
AST: 18 U/L (ref 0–37)
BILIRUBIN TOTAL: 0.3 mg/dL (ref 0.2–1.2)
BUN: 21 mg/dL (ref 6–23)
CO2: 25 mEq/L (ref 19–32)
Calcium: 9.7 mg/dL (ref 8.4–10.5)
Chloride: 103 mEq/L (ref 96–112)
Creatinine, Ser: 0.74 mg/dL (ref 0.40–1.20)
GFR: 98.99 mL/min (ref >60.00)
GLUCOSE: 109 mg/dL — AB (ref 70–99)
POTASSIUM: 4.3 meq/L (ref 3.5–5.1)
Sodium: 138 mEq/L (ref 135–145)
TOTAL PROTEIN: 6.7 g/dL (ref 6.0–8.3)

## 2016-03-23 LAB — LIPID PANEL
Cholesterol: 239 mg/dL — ABNORMAL HIGH (ref 0–200)
HDL: 59.7 mg/dL (ref >39.00)
LDL Cholesterol: 142 mg/dL — ABNORMAL HIGH (ref 0–99)
NONHDL: 179.17
Total CHOL/HDL Ratio: 4
Triglycerides: 187 mg/dL — ABNORMAL HIGH (ref 0.0–149.0)
VLDL: 37.4 mg/dL (ref 0.0–40.0)

## 2016-03-23 LAB — HEMOGLOBIN A1C: HEMOGLOBIN A1C: 7 % — AB (ref 4.6–6.5)

## 2016-03-23 MED ORDER — AMOXICILLIN-POT CLAVULANATE 875-125 MG PO TABS: 1.0000 | ORAL_TABLET | Freq: Two times a day (BID) | ORAL | 0 refills | Status: DC

## 2016-03-23 MED ORDER — GLUCOSE BLOOD VI STRP: ORAL_STRIP | 5 refills | Status: DC

## 2016-03-23 NOTE — Telephone Encounter (Signed)
Sent to [facility]. Left msg for patient that scripts sent to [facility].

## 2016-03-23 NOTE — Progress Notes (Signed)
Pre visit review using our clinic review tool, if applicable. No additional management support is needed unless otherwise documented below in the visit note. 

## 2016-03-23 NOTE — Telephone Encounter (Signed)
Relation to [id number] Call back [facility]: [facility] # [address], [place] [phone] (Phone) [phone] (Fax)     Reason for call:  Patient requesting glucose blood (ACCU-CHEK AVIVA PLUS) test strip, amoxicillin-clavulanate (AUGMENTIN) 875-125 MG tablet please send to [facility] # [place], [place], chart reflects Rx where sent to another pharmacy, patient is currently at pharmacy

## 2016-03-23 NOTE — Progress Notes (Signed)
Patient ID: Vicki Dennis, female   DOB: 1945/11/19, 70 y.o.   MRN: [id number]   Subjective:    Patient ID: Vicki Dennis, female    DOB: [birth year], 70 y.o.   MRN: [id number]  Chief Complaint  Patient presents with  . Nasal Congestion    Some eye swelling, vomitting x1 week. Yellow--Green mucus.    HPI Patient is in today for complaining of 1-2 weeks of worsening respiratory concerns. She reports head and chest congestion productive of yellow to green sputum. Also endorses eye discharge, headache, malaise, myalgias and sinus pressure. She reports nausea daily and an episode of vomiting every few days. Denies CP/palp or GU c/o. Taking meds as prescribed.  Past Medical History:  Diagnosis Date  . Allergy   . Anemia    h/o  . Asthma   . Asthma, mild intermittent, well-controlled 02/10/2014  . Blood transfusion without reported diagnosis   . Chicken pox as a child  . Diabetes mellitus    type-II, per Dr. Parke Simmers, no meds. , pt. reports weight loss has contributed  to control of diabetes.  She reports last HgbA1c- wnl.  . Diabetes mellitus 11/05/2010  . Diabetes mellitus type 2 in obese (HCC) 11/05/2010  . Diabetes mellitus type 2, uncontrolled (HCC) 11/05/2010  . Esophageal reflux 02/10/2014  . GERD (gastroesophageal reflux disease)    rare use of OTC aid  . Hyperglycemia 09/02/2015  . Hyperlipidemia   . Hypertension   . Measles as a child  . Medicare annual wellness visit, subsequent 08/06/2014  . Mumps as a child  . Obesity 02/10/2014  . Pneumonia    as a child  . Portal vein thrombosis 03/09/2011   coumadin ended summer of 2013  . Preventative health care 08/12/2014  . Rash and nonspecific skin eruption 01/07/2015  . Seborrheic keratosis 02/10/2014   Follows with dermatology  . Sinusitis, acute 03/23/2016    Past Surgical History:  Procedure Laterality Date  . BREAST SURGERY     benign - cysts  . INSERTION OF MESH  03/16/2012   Procedure: INSERTION OF MESH;  Surgeon:  Shelly Rubenstein, MD;  Location: [facility] OR;  Service: General;  Laterality: N/A;  . KNEE ARTHROSCOPY W/ MENISCAL REPAIR     both knees  . TONSILLECTOMY    . UMBILICAL HERNIA REPAIR  03/16/2012   Procedure: HERNIA REPAIR UMBILICAL ADULT;  Surgeon: Shelly Rubenstein, MD;  Location: [facility] OR;  Service: General;  Laterality: N/A;    Family History  Problem Relation Age of Onset  . Cancer Father     prostate  . Cancer Maternal Aunt   . Aneurysm Mother 54    brain    Social History   Social History  . Marital status: Divorced    Spouse name: N/A  . Number of children: N/A  . Years of education: N/A   Occupational History  . Not on file.   Social History Main Topics  . Smoking status: Never Smoker  . Smokeless tobacco: Never Used     Comment: never used tobacco  . Alcohol use 0.0 oz/week     Comment: for holidays  . Drug use: No  . Sexual activity: No     Comment: lives alone    Other Topics Concern  . Not on file   Social History Narrative  . No narrative on file    Outpatient Medications Prior to Visit  Medication Sig Dispense Refill  . ACCU-CHEK AVIVA PLUS test strip USE  AS DIRECTED ONCE DAILY TO CHECK BLOOD SUGAR. 100 each 5  . aspirin 81 MG tablet Take 81 mg by mouth daily.     Marland Kitchen azelastine (ASTELIN) 0.1 % nasal spray Place 2 sprays into both nostrils 2 (two) times daily. Use in each nostril as directed 30 mL 6  . budesonide-formoterol (SYMBICORT) 80-4.5 MCG/ACT inhaler Inhale 1 puff into the lungs daily. Uses with seasonal allergies in the Spring and the Fall.    . cetirizine (ZYRTEC) 10 MG tablet Take 10 mg by mouth daily with breakfast.     . enalapril (VASOTEC) 2.5 MG tablet TAKE 1 TABLET(2.5 MG) BY MOUTH DAILY 90 tablet 2  . enalapril (VASOTEC) 2.5 MG tablet Take 1 tablet (2.5 mg total) by mouth daily. 90 tablet 2  . EPIPEN 2-PAK 0.3 MG/0.3ML SOAJ injection     . fluticasone (FLONASE) 50 MCG/ACT nasal spray Place 2 sprays into both nostrils daily. 16 g 6  .  Lancets 30G MISC Use as directed once daily to check blood sugar.  Diagnosis code E11.9 100 each 5  . montelukast (SINGULAIR) 10 MG tablet Take 1 tablet (10 mg total) by mouth at bedtime. 90 tablet 3  . Multiple Vitamins-Minerals (MULTIVITAMIN WITH MINERALS) tablet Take 1 tablet by mouth daily.      . Omega-3 Fatty Acids (FISH OIL CONCENTRATE) 1000 MG CAPS Take by mouth 3 (three) times daily.    . ondansetron (ZOFRAN-ODT) 8 MG disintegrating tablet Take 1 tablet (8 mg total) by mouth every 8 (eight) hours as needed for nausea or vomiting. 15 tablet 0  . ondansetron (ZOFRAN-ODT) 8 MG disintegrating tablet DISSOLVE 1 TABLET(8 MG) ON THE TONGUE EVERY 8 HOURS AS NEEDED FOR NAUSEA OR VOMITING 15 tablet 0  . PROVENTIL HFA 108 (90 BASE) MCG/ACT inhaler     . glucose blood (ACCU-CHEK AVIVA PLUS) test strip Use as directed once daily to check blood sugar.  Diagnosis code E11.9 100 each 5   No facility-administered medications prior to visit.     Allergies  Allergen Reactions  . Codeine Other (See Comments)    "makes me crazy"  . Sulfa Antibiotics Anaphylaxis    "died 3 times".  . Oxycodone     Review of Systems  Constitutional: Positive for fever and malaise/fatigue. Negative for chills.  HENT: Positive for congestion, sinus pain and sore throat.   Eyes: Negative for blurred vision.  Respiratory: Positive for cough and sputum production. Negative for hemoptysis, shortness of breath and wheezing.   Cardiovascular: Negative for chest pain, palpitations and leg swelling.  Gastrointestinal: Positive for nausea and vomiting. Negative for abdominal pain, blood in stool and heartburn.  Genitourinary: Negative for dysuria and frequency.  Musculoskeletal: Negative for falls.  Skin: Negative for rash.  Neurological: Negative for dizziness, loss of consciousness and headaches.  Endo/Heme/Allergies: Negative for environmental allergies.  Psychiatric/Behavioral: Negative for depression. The patient is not  nervous/anxious.        Objective:    Physical Exam  Constitutional: She is oriented to person, place, and time. She appears well-developed and well-nourished. No distress.  HENT:  Head: Normocephalic and atraumatic.  Nose: Nose normal.  Eyes: Right eye exhibits discharge. Left eye exhibits discharge.  Scant clear discharge from eyes. Nasal mucosa erythematous  And boggy.  Neck: Normal range of motion. Neck supple.  Cardiovascular: Normal rate and regular rhythm.   No murmur heard. Pulmonary/Chest: Effort normal and breath sounds normal.  Abdominal: Soft. Bowel sounds are normal. There is no tenderness.  Musculoskeletal: She exhibits no edema.  Lymphadenopathy:    She has cervical adenopathy.  Neurological: She is alert and oriented to person, place, and time.  Skin: Skin is warm and dry.  Psychiatric: She has a normal mood and affect.  Nursing note and vitals reviewed.   BP 122/78 (BP Location: Left Arm, Patient Position: Sitting, Cuff Size: Large)   Pulse 71   Temp 98.2 F (36.8 C) (Oral)   Wt 192 lb 12.8 oz (87.5 kg)   SpO2 98% Comment: RA  BMI 31.12 kg/m  Wt Readings from Last 3 Encounters:  03/23/16 192 lb 12.8 oz (87.5 kg)  12/20/15 193 lb 8 oz (87.8 kg)  10/17/15 196 lb (88.9 kg)     Lab Results  Component Value Date   WBC 5.9 12/20/2015   HGB 13.4 12/20/2015   HCT 40.7 12/20/2015   PLT 190.0 12/20/2015   GLUCOSE 110 (H) 12/20/2015   CHOL 203 (H) 12/20/2015   TRIG 97.0 12/20/2015   HDL 60.00 12/20/2015   LDLDIRECT 111.2 02/06/2014   LDLCALC 123 (H) 12/20/2015   ALT 15 12/20/2015   AST 14 12/20/2015   NA 138 12/20/2015   K 3.9 12/20/2015   CL 107 12/20/2015   CREATININE 0.69 12/20/2015   BUN 19 12/20/2015   CO2 26 12/20/2015   TSH 0.98 12/20/2015   INR 2.2 09/21/2011   HGBA1C 6.7 (H) 12/20/2015   MICROALBUR 2.2 (H) 02/06/2014    Lab Results  Component Value Date   TSH 0.98 12/20/2015   Lab Results  Component Value Date   WBC 5.9  12/20/2015   HGB 13.4 12/20/2015   HCT 40.7 12/20/2015   MCV 87.7 12/20/2015   PLT 190.0 12/20/2015   Lab Results  Component Value Date   NA 138 12/20/2015   K 3.9 12/20/2015   CHLORIDE 110 (H) 10/10/2015   CO2 26 12/20/2015   GLUCOSE 110 (H) 12/20/2015   BUN 19 12/20/2015   CREATININE 0.69 12/20/2015   BILITOT 0.5 12/20/2015   ALKPHOS 62 12/20/2015   AST 14 12/20/2015   ALT 15 12/20/2015   PROT 6.5 12/20/2015   ALBUMIN 4.0 12/20/2015   CALCIUM 9.1 12/20/2015   ANIONGAP 8 10/10/2015   EGFR 86 (L) 10/10/2015   GFR 107.39 12/20/2015   Lab Results  Component Value Date   CHOL 203 (H) 12/20/2015   Lab Results  Component Value Date   HDL 60.00 12/20/2015   Lab Results  Component Value Date   LDLCALC 123 (H) 12/20/2015   Lab Results  Component Value Date   TRIG 97.0 12/20/2015   Lab Results  Component Value Date   CHOLHDL 3 12/20/2015   Lab Results  Component Value Date   HGBA1C 6.7 (H) 12/20/2015       Assessment & Plan:   Problem List Items Addressed This Visit    Diabetes mellitus type 2 in obese (HCC)    hgba1c acceptable, minimize simple carbs. Increase exercise as tolerated. Continue current meds      Relevant Orders   Hemoglobin A1c   Ambulatory referral to Ophthalmology   HTN (hypertension)    Well controlled, no changes to meds. Encouraged heart healthy diet such as the DASH diet and exercise as tolerated.       Relevant Orders   Comprehensive metabolic panel   TSH   CBC w/Diff   Ambulatory referral to Ophthalmology   Hyperlipidemia    encouraged heart healthy diet, avoid trans fats, minimize simple carbs and  saturated fats. Increase exercise as tolerated      Relevant Orders   Lipid panel   Sinusitis, acute   Relevant Medications   amoxicillin-clavulanate (AUGMENTIN) 875-125 MG tablet   Other Relevant Orders   CBC w/Diff    Other Visit Diagnoses    Decreased visual acuity    -  Primary   Relevant Orders   Ambulatory referral to  Ophthalmology      I am having Ms. Gander start on amoxicillin-clavulanate. I am also having her maintain her budesonide-formoterol, multivitamin with minerals, cetirizine, aspirin, PROVENTIL HFA, FISH OIL CONCENTRATE, fluticasone, Lancets 30G, EPIPEN 2-PAK, enalapril, ondansetron, montelukast, enalapril, azelastine, ondansetron, ACCU-CHEK AVIVA PLUS, and glucose blood.  Meds ordered this encounter  Medications  . glucose blood (ACCU-CHEK AVIVA PLUS) test strip    Sig: Use as directed once daily to check blood sugar.  Diagnosis code E11.9    Dispense:  100 each    Refill:  5  . amoxicillin-clavulanate (AUGMENTIN) 875-125 MG tablet    Sig: Take 1 tablet by mouth 2 (two) times daily.    Dispense:  20 tablet    Refill:  0     Danise Edge, MD

## 2016-03-23 NOTE — Patient Instructions (Addendum)
Encouraged increased rest and hydration, add probiotics, zinc such as Coldeze or Xicam. Treat fevers as needed. Elderberry, aged or black garlic, Vitamin C [id number] to 1000 mg   kegel exercises sets of 10 twice daily  Ginger capsules or tea bags for nausea   Sinusitis, Adult Sinusitis is soreness and inflammation of your sinuses. Sinuses are hollow spaces in the bones around your face. Your sinuses are located:  Around your eyes.  In the middle of your forehead.  Behind your nose.  In your cheekbones. Your sinuses and nasal passages are lined with a stringy fluid (mucus). Mucus normally drains out of your sinuses. When your nasal tissues become inflamed or swollen, the mucus can become trapped or blocked so air cannot flow through your sinuses. This allows bacteria, viruses, and funguses to grow, which leads to infection. Sinusitis can develop quickly and last for 7?10 days (acute) or for more than 12 weeks (chronic). Sinusitis often develops after a cold. What are the causes? This condition is caused by anything that creates swelling in the sinuses or stops mucus from draining, including:  Allergies.  Asthma.  Bacterial or viral infection.  Abnormally shaped bones between the nasal passages.  Nasal growths that contain mucus (nasal polyps).  Narrow sinus openings.  Pollutants, such as chemicals or irritants in the air.  A foreign object stuck in the nose.  A fungal infection. This is rare. What increases the risk? The following factors may make you more likely to develop this condition:  Having allergies or asthma.  Having had a recent cold or respiratory tract infection.  Having structural deformities or blockages in your nose or sinuses.  Having a weak immune system.  Doing a lot of swimming or diving.  Overusing nasal sprays.  Smoking. What are the signs or symptoms? The main symptoms of this condition are pain and a feeling of pressure around the affected  sinuses. Other symptoms include:  Upper toothache.  Earache.  Headache.  Bad breath.  Decreased sense of smell and taste.  A cough that may get worse at night.  Fatigue.  Fever.  Thick drainage from your nose. The drainage is often green and it may contain pus (purulent).  Stuffy nose or congestion.  Postnasal drip. This is when extra mucus collects in the throat or back of the nose.  Swelling and warmth over the affected sinuses.  Sore throat.  Sensitivity to light. How is this diagnosed? This condition is diagnosed based on symptoms, a medical history, and a physical exam. To find out if your condition is acute or chronic, your health care provider may:  Look in your nose for signs of nasal polyps.  Tap over the affected sinus to check for signs of infection.  View the inside of your sinuses using an imaging device that has a light attached (endoscope). If your health care provider suspects that you have chronic sinusitis, you may also:  Be tested for allergies.  Have a sample of mucus taken from your nose (nasal culture) and checked for bacteria.  Have a mucus sample examined to see if your sinusitis is related to an allergy. If your sinusitis does not respond to treatment and it lasts longer than 8 weeks, you may have an MRI or CT scan to check your sinuses. These scans also help to determine how severe your infection is. In rare cases, a bone biopsy may be done to rule out more serious types of fungal sinus disease. How is this treated? Treatment  for sinusitis depends on the cause and whether your condition is chronic or acute. If a virus is causing your sinusitis, your symptoms will go away on their own within 10 days. You may be given medicines to relieve your symptoms, including:  Topical nasal decongestants. They shrink swollen nasal passages and let mucus drain from your sinuses.  Antihistamines. These drugs block inflammation that is triggered by  allergies. This can help to ease swelling in your nose and sinuses.  Topical nasal corticosteroids. These are nasal sprays that ease inflammation and swelling in your nose and sinuses.  Nasal saline washes. These rinses can help to get rid of thick mucus in your nose. If your condition is caused by bacteria, you will be given an antibiotic medicine. If your condition is caused by a fungus, you will be given an antifungal medicine. Surgery may be needed to correct underlying conditions, such as narrow nasal passages. Surgery may also be needed to remove polyps. Follow these instructions at home: Medicines  Take, use, or apply over-the-counter and prescription medicines only as told by your health care provider. These may include nasal sprays.  If you were prescribed an antibiotic medicine, take it as told by your health care provider. Do not stop taking the antibiotic even if you start to feel better. Hydrate and Humidify  Drink enough water to keep your urine clear or pale yellow. Staying hydrated will help to thin your mucus.  Use a cool mist humidifier to keep the humidity level in your home above 50%.  Inhale steam for 10-15 minutes, 3-4 times a day or as told by your health care provider. You can do this in the bathroom while a hot shower is running.  Limit your exposure to cool or dry air. Rest  Rest as much as possible.  Sleep with your head raised (elevated).  Make sure to get enough sleep each night. General instructions  Apply a warm, moist washcloth to your face 3-4 times a day or as told by your health care provider. This will help with discomfort.  Wash your hands often with soap and water to reduce your exposure to viruses and other germs. If soap and water are not available, use hand sanitizer.  Do not smoke. Avoid being around people who are smoking (secondhand smoke).  Keep all follow-up visits as told by your health care provider. This is important. Contact a  health care provider if:  You have a fever.  Your symptoms get worse.  Your symptoms do not improve within 10 days. Get help right away if:  You have a severe headache.  You have persistent vomiting.  You have pain or swelling around your face or eyes.  You have vision problems.  You develop confusion.  Your neck is stiff.  You have trouble breathing. This information is not intended to replace advice given to you by your health care provider. Make sure you discuss any questions you have with your health care provider. Document Released: 03/30/2005 Document Revised: 11/24/2015 Document Reviewed: 01/23/2015 Elsevier Interactive Patient Education  2017 [employer].

## 2016-03-23 NOTE — Assessment & Plan Note (Signed)
encouraged heart healthy diet, avoid trans fats, minimize simple carbs and saturated fats. Increase exercise as tolerated 

## 2016-03-23 NOTE — Assessment & Plan Note (Signed)
hgba1c acceptable, minimize simple carbs. Increase exercise as tolerated. Continue current meds 

## 2016-03-23 NOTE — Assessment & Plan Note (Signed)
Well controlled, no changes to meds. Encouraged heart healthy diet such as the DASH diet and exercise as tolerated.  °

## 2016-04-17 ENCOUNTER — Ambulatory Visit: Payer: Medicare Other | Admitting: Hematology & Oncology

## 2016-04-17 ENCOUNTER — Other Ambulatory Visit: Payer: Medicare Other

## 2016-04-20 ENCOUNTER — Ambulatory Visit: Payer: Medicare Other | Admitting: Family Medicine

## 2016-04-22 ENCOUNTER — Telehealth: Payer: Self-pay | Admitting: *Deleted

## 2016-04-22 NOTE — Telephone Encounter (Signed)
Called patient and left message to return call to schedule AWV w/ [facility]. 

## 2016-06-09 ENCOUNTER — Telehealth: Payer: Self-pay | Admitting: *Deleted

## 2016-06-09 NOTE — Telephone Encounter (Signed)
Schedule AWV 06/15/16 @130 

## 2016-06-15 ENCOUNTER — Ambulatory Visit (INDEPENDENT_AMBULATORY_CARE_PROVIDER_SITE_OTHER): Payer: Medicare Other | Admitting: Family Medicine

## 2016-06-15 ENCOUNTER — Encounter: Payer: Self-pay | Admitting: Family Medicine

## 2016-06-15 VITALS — BP 120/68 | HR 70 | Temp 98.0°F | Resp 16 | Ht 66.0 in | Wt 197.2 lb

## 2016-06-15 DIAGNOSIS — I10 Essential (primary) hypertension: Secondary | ICD-10-CM | POA: Diagnosis not present

## 2016-06-15 DIAGNOSIS — E669 Obesity, unspecified: Secondary | ICD-10-CM | POA: Diagnosis not present

## 2016-06-15 DIAGNOSIS — Z0000 Encounter for general adult medical examination without abnormal findings: Secondary | ICD-10-CM

## 2016-06-15 DIAGNOSIS — E1169 Type 2 diabetes mellitus with other specified complication: Secondary | ICD-10-CM | POA: Diagnosis not present

## 2016-06-15 DIAGNOSIS — E782 Mixed hyperlipidemia: Secondary | ICD-10-CM | POA: Diagnosis not present

## 2016-06-15 DIAGNOSIS — Z780 Asymptomatic menopausal state: Secondary | ICD-10-CM | POA: Diagnosis not present

## 2016-06-15 MED ORDER — BUDESONIDE-FORMOTEROL FUMARATE 80-4.5 MCG/ACT IN AERO: 1.0000 | INHALATION_SPRAY | Freq: Every day | RESPIRATORY_TRACT | 1 refills | Status: DC

## 2016-06-15 NOTE — Assessment & Plan Note (Signed)
Well controlled, no changes to meds. Encouraged heart healthy diet such as the DASH diet and exercise as tolerated.  °

## 2016-06-15 NOTE — Patient Instructions (Signed)
Carbohydrate Counting for Diabetes Mellitus, Adult Carbohydrate counting is a method for keeping track of how many carbohydrates you eat. Eating carbohydrates naturally increases the amount of sugar (glucose) in the blood. Counting how many carbohydrates you eat helps keep your blood glucose within normal limits, which helps you manage your diabetes (diabetes mellitus). It is important to know how many carbohydrates you can safely have in each meal. This is different for every person. A diet and nutrition specialist (registered dietitian) can help you make a meal plan and calculate how many carbohydrates you should have at each meal and snack. Carbohydrates are found in the following foods:  Grains, such as breads and cereals.  Dried beans and soy products.  Starchy vegetables, such as potatoes, peas, and corn.  Fruit and fruit juices.  Milk and yogurt.  Sweets and snack foods, such as cake, cookies, candy, chips, and soft drinks. How do I count carbohydrates? There are two ways to count carbohydrates in food. You can use either of the methods or a combination of both. Reading "Nutrition Facts" on packaged food  The "Nutrition Facts" list is included on the labels of almost all packaged foods and beverages in the [place] It includes:  The serving size.  Information about nutrients in each serving, including the grams (g) of carbohydrate per serving. To use the "Nutrition Facts":  Decide how many servings you will have.  Multiply the number of servings by the number of carbohydrates per serving.  The resulting number is the total amount of carbohydrates that you will be having. Learning standard serving sizes of other foods  When you eat foods containing carbohydrates that are not packaged or do not include "Nutrition Facts" on the label, you need to measure the servings in order to count the amount of carbohydrates:  Measure the foods that you will eat with a food scale or measuring  cup, if needed.  Decide how many standard-size servings you will eat.  Multiply the number of servings by 15. Most carbohydrate-rich foods have about 15 g of carbohydrates per serving.  For example, if you eat 8 oz (170 g) of strawberries, you will have eaten 2 servings and 30 g of carbohydrates (2 servings x 15 g = 30 g).  For foods that have more than one food mixed, such as soups and casseroles, you must count the carbohydrates in each food that is included. The following list contains standard serving sizes of common carbohydrate-rich foods. Each of these servings has about 15 g of carbohydrates:   hamburger bun or  English muffin.   oz (15 mL) syrup.   oz (14 g) jelly.  1 slice of bread.  1 six-inch tortilla.  3 oz (85 g) cooked rice or pasta.  4 oz (113 g) cooked dried beans.  4 oz (113 g) starchy vegetable, such as peas, corn, or potatoes.  4 oz (113 g) hot cereal.  4 oz (113 g) mashed potatoes or  of a large baked potato.  4 oz (113 g) canned or frozen fruit.  4 oz (120 mL) fruit juice.  4-6 crackers.  6 chicken nuggets.  6 oz (170 g) unsweetened dry cereal.  6 oz (170 g) plain fat-free yogurt or yogurt sweetened with artificial sweeteners.  8 oz (240 mL) milk.  8 oz (170 g) fresh fruit or one small piece of fruit.  24 oz (680 g) popped popcorn. Example of carbohydrate counting Sample meal  3 oz (85 g) chicken breast.  6 oz (  170 g) brown rice.  4 oz (113 g) corn.  8 oz (240 mL) milk.  8 oz (170 g) strawberries with sugar-free whipped topping. Carbohydrate calculation 1. Identify the foods that contain carbohydrates:  Rice.  Corn.  Milk.  Strawberries. 2. Calculate how many servings you have of each food:  2 servings rice.  1 serving corn.  1 serving milk.  1 serving strawberries. 3. Multiply each number of servings by 15 g:  2 servings rice x 15 g = 30 g.  1 serving corn x 15 g = 15 g.  1 serving milk x 15 g = 15  g.  1 serving strawberries x 15 g = 15 g. 4. Add together all of the amounts to find the total grams of carbohydrates eaten:  30 g + 15 g + 15 g + 15 g = 75 g of carbohydrates total. This information is not intended to replace advice given to you by your health care provider. Make sure you discuss any questions you have with your health care provider. Document Released: 03/30/2005 Document Revised: 10/18/2015 Document Reviewed: 09/11/2015 Elsevier Interactive Patient Education  2017 [employer] Inc[employer]  

## 2016-06-15 NOTE — Progress Notes (Signed)
Pre visit review using our clinic review tool, if applicable. No additional management support is needed unless otherwise documented below in the visit note. 

## 2016-06-15 NOTE — Progress Notes (Addendum)
Subjective:   Vicki Dennis is a 71 y.o. female who presents for Medicare Annual (Subsequent) preventive examination.  Review of Systems:  No ROS.  Medicare Wellness Visit.  Cardiac Risk Factors include: advanced age (>90men, >40 women);diabetes mellitus;dyslipidemia;hypertension;obesity (BMI >30kg/m2)  Sleep patterns: no sleep issues, falls asleep easily and feels rested on waking. Sometimes stays up late or wakes up during the night to write-she is currently working on a short children's story. Takes some day time naps when sleep was interrupted the night prior.   Home Safety/Smoke Alarms: Feels safe in home. Smoke alarms in place. Carbon monoxide detectors in place.   Living environment; residence and Firearm Safety: Lives alone. 1-story house/ trailer, can live on one level, no firearms. Seat Belt Safety/Bike Helmet: Wears seat belt.   Counseling:   Eye Exam- Follows w/ eye doctor at [facility] every 1-2 years.  Female:   Pap- Aged out.       Mammo- last 10/31/15. BI-RADS CATEGORY  1: Negative.      Dexa scan- Not on file.         CCS- last 03/13/13. No report on file. >> Pt reports last was with Dr. Juanita Craver.     Objective:     Vitals: BP 120/68 (BP Location: Left Arm, Patient Position: Sitting, Cuff Size: Large)   Pulse 70   Temp 98 F (36.7 C) (Oral)   Resp 16   Ht 5\' 6"  (1.676 m)   Wt 197 lb 3.2 oz (89.4 kg)   SpO2 97% Comment: RA  BMI 31.83 kg/m   Body mass index is 31.83 kg/m.  Wt Readings from Last 3 Encounters:  06/15/16 197 lb 3.2 oz (89.4 kg)  03/23/16 192 lb 12.8 oz (87.5 kg)  12/20/15 193 lb 8 oz (87.8 kg)    Tobacco History  Smoking Status  . Never Smoker  Smokeless Tobacco  . Never Used    Comment: never used tobacco     Counseling given: Not Answered   Past Medical History:  Diagnosis Date  . Allergy   . Anemia    h/o  . Asthma   . Asthma, mild intermittent, well-controlled 02/10/2014  . Blood transfusion without reported diagnosis     . Chicken pox as a child  . Diabetes mellitus    type-II, per Dr. Criss Rosales, no meds. , pt. reports weight loss has contributed  to control of diabetes.  She reports last HgbA1c- wnl.  . Diabetes mellitus 11/05/2010  . Diabetes mellitus type 2 in obese ([place]) 11/05/2010  . Diabetes mellitus type 2, uncontrolled ([place]) 11/05/2010  . Esophageal reflux 02/10/2014  . GERD (gastroesophageal reflux disease)    rare use of OTC aid  . Hyperglycemia 09/02/2015  . Hyperlipidemia   . Hypertension   . Measles as a child  . Medicare annual wellness visit, subsequent 08/06/2014  . Mumps as a child  . Obesity 02/10/2014  . Pneumonia    as a child  . Portal vein thrombosis 03/09/2011   coumadin ended summer of 2013  . Preventative health care 08/12/2014  . Rash and nonspecific skin eruption 01/07/2015  . Seborrheic keratosis 02/10/2014   Follows with dermatology  . Sinusitis, acute 03/23/2016   Past Surgical History:  Procedure Laterality Date  . BREAST SURGERY     benign - cysts  . INSERTION OF MESH  03/16/2012   Procedure: INSERTION OF MESH;  Surgeon: Harl Bowie, MD;  Location: [facility];  Service: General;  Laterality: N/A;  .  KNEE ARTHROSCOPY W/ MENISCAL REPAIR     both knees  . TONSILLECTOMY    . UMBILICAL HERNIA REPAIR  03/16/2012   Procedure: HERNIA REPAIR UMBILICAL ADULT;  Surgeon: Harl Bowie, MD;  Location: [facility];  Service: General;  Laterality: N/A;   Family History  Problem Relation Age of Onset  . Cancer Father     prostate  . Aneurysm Mother 76    brain  . Cancer Maternal Aunt    History  Sexual Activity  . Sexual activity: No    Comment: lives alone     Outpatient Encounter Prescriptions as of 06/15/2016  Medication Sig  . aspirin 81 MG tablet Take 81 mg by mouth daily.   Marland Kitchen azelastine (ASTELIN) 0.1 % nasal spray Place 2 sprays into both nostrils 2 (two) times daily. Use in each nostril as directed  . budesonide-formoterol (SYMBICORT) 80-4.5 MCG/ACT inhaler Inhale 1  puff into the lungs daily. Uses with seasonal allergies in the Spring and the Fall.  . cetirizine (ZYRTEC) 10 MG tablet Take 10 mg by mouth daily with breakfast.   . enalapril (VASOTEC) 2.5 MG tablet Take 1 tablet (2.5 mg total) by mouth daily.  Marland Kitchen EPIPEN 2-PAK 0.3 MG/0.3ML SOAJ injection   . fluticasone (FLONASE) 50 MCG/ACT nasal spray Place 2 sprays into both nostrils daily.  Marland Kitchen glucose blood (ACCU-CHEK AVIVA PLUS) test strip Use once a day to check blood sugar.  DX  E11.9  . Lancets 30G MISC Use as directed once daily to check blood sugar.  Diagnosis code E11.9  . montelukast (SINGULAIR) 10 MG tablet Take 1 tablet (10 mg total) by mouth at bedtime.  . Multiple Vitamins-Minerals (MULTIVITAMIN WITH MINERALS) tablet Take 1 tablet by mouth daily.    . Omega-3 Fatty Acids (FISH OIL CONCENTRATE) 1000 MG CAPS Take by mouth 3 (three) times daily.  . ondansetron (ZOFRAN-ODT) 8 MG disintegrating tablet DISSOLVE 1 TABLET(8 MG) ON THE TONGUE EVERY 8 HOURS AS NEEDED FOR NAUSEA OR VOMITING  . PROVENTIL HFA 108 (90 BASE) MCG/ACT inhaler   . [DISCONTINUED] budesonide-formoterol (SYMBICORT) 80-4.5 MCG/ACT inhaler Inhale 1 puff into the lungs daily. Uses with seasonal allergies in the Spring and the Fall.  . [DISCONTINUED] enalapril (VASOTEC) 2.5 MG tablet TAKE 1 TABLET(2.5 MG) BY MOUTH DAILY  . [DISCONTINUED] glucose blood (ACCU-CHEK AVIVA PLUS) test strip Use as directed once daily to check blood sugar.  Diagnosis code E11.9  . [DISCONTINUED] ondansetron (ZOFRAN-ODT) 8 MG disintegrating tablet Take 1 tablet (8 mg total) by mouth every 8 (eight) hours as needed for nausea or vomiting.  . [DISCONTINUED] amoxicillin-clavulanate (AUGMENTIN) 875-125 MG tablet Take 1 tablet by mouth 2 (two) times daily. (Patient not taking: Reported on 06/15/2016)   No facility-administered encounter medications on file as of 06/15/2016.     Activities of Daily Living In your present state of health, do you have any difficulty  performing the following activities: 06/15/2016 09/02/2015  Hearing? N N  Vision? N N  Difficulty concentrating or making decisions? N N  Walking or climbing stairs? N N  Dressing or bathing? N N  Doing errands, shopping? N N  Preparing Food and eating ? N -  Using the Toilet? N -  In the past six months, have you accidently leaked urine? Y -  Do you have problems with loss of bowel control? N -  Managing your Medications? N -  Managing your Finances? N -  Housekeeping or managing your Housekeeping? N -  Some recent data  might be hidden    Patient Care Team: Mosie Lukes, MD as PCP - General (Family Medicine)    Assessment:    Physical assessment deferred to PCP.  Exercise Activities and Dietary recommendations Current Exercise Habits: The patient has a physically strenous job, but has no regular exercise apart from work.  Diet (meal preparation, eat out, water intake, caffeinated beverages, dairy products, fruits and vegetables): in general, a "healthy" diet  , well balanced, on average, 3 meals per day, diabetic. Rarely drinks soda/sugary drinks. Drinks lots of plain water and coconut water. Tries to minimize red meat. Mostly bakes or grills meat; rarely eats fried food. Breakfast: boiled/scrambled egg, fruit, coffee or fiber-based cereal or oatmeal w/ banana/blueberries Lunch: varies-1/2 sandwich, soup, leftovers, salad   Dinner: varies-pot roast w/ veggies, grass fed beef       Goals    . HEMOGLOBIN A1C < 7.0    . Increase physical activity    . Weight < 200 lb (90.719 kg)      Fall Risk Fall Risk  06/15/2016 10/10/2015 09/02/2015 08/06/2014 06/08/2014  Falls in the past year? No No No No No   Depression Screen PHQ 2/9 Scores 06/15/2016 09/02/2015 08/06/2014  PHQ - 2 Score 0 0 0     Cognitive Function MMSE - Mini Mental State Exam 06/15/2016  Orientation to time 4  Orientation to Place 5  Registration 3  Attention/ Calculation 5  Recall 3  Language- name 2 objects 2    Language- repeat 1  Language- follow 3 step command 3  Language- read & follow direction 1  Write a sentence 1  Copy design 1  Total score 29        Immunization History  Administered Date(s) Administered  . Influenza, High Dose Seasonal PF 12/20/2015  . Influenza,inj,Quad PF,36+ Mos 01/07/2015  . Influenza-Unspecified 01/16/2014  . Pneumococcal Conjugate-13 10/04/2015  . Td 04/13/2005, 12/20/2015   Screening Tests Health Maintenance  Topic Date Due  . FOOT EXAM  05/29/1955  . OPHTHALMOLOGY EXAM  05/29/1955  . DEXA SCAN  05/29/2008  . HEMOGLOBIN A1C  09/21/2016  . PNA vac Low Risk Adult (2 of 2 - PPSV23) 10/03/2016  . MAMMOGRAM  10/30/2017  . COLONOSCOPY  03/14/2023  . TETANUS/TDAP  12/19/2025  . INFLUENZA VACCINE  Completed  . Hepatitis C Screening  Completed      Plan:    Follow-up w/ PCP as directed.  Due for eye exam-pt to schedule at her convenience.   Bring a copy of your advance directives to your next office visit.  Continue doing brain stimulating activities (puzzles, reading, adult coloring books, staying active) to keep memory [place].   Records release signed by patient and faxed to Dr. Juanita Craver for colonoscopy report.  During the course of the visit the patient was educated and counseled about the following appropriate screening and preventive services:   Vaccines to include Pneumoccal, Influenza, Hepatitis B, Td, Zostavax, HCV  Cardiovascular Disease  Colorectal cancer screening  Bone density screening  Diabetes screening  Glaucoma screening  Mammography  Nutrition counseling   Patient Instructions (the written plan) was given to the patient.   Dorrene German, RN  06/15/2016   RN AWV note reviewed. Agree with documention and plan.  Penni Homans, MD

## 2016-06-15 NOTE — Assessment & Plan Note (Signed)
hgba1c acceptable, minimize simple carbs. Increase exercise as tolerated. Continue current meds 

## 2016-06-15 NOTE — Assessment & Plan Note (Signed)
Encouraged heart healthy diet, increase exercise, avoid trans fats, consider a krill oil cap daily 

## 2016-06-15 NOTE — Assessment & Plan Note (Signed)
Encouraged DASH diet, decrease po intake and increase exercise as tolerated. Needs 7-8 hours of sleep nightly. Avoid trans fats, eat small, frequent meals every 4-5 hours with lean proteins, complex carbs and healthy fats. Minimize simple carbs. Consider bariatric program

## 2016-06-15 NOTE — Progress Notes (Signed)
Patient ID: Vicki Dennis, female   DOB: 11/06/1945, 71 y.o.   MRN: [id number]   Subjective:  I acted as a Neurosurgeon for Danise Edge, MD. [place], [place]   Patient ID: Vicki Dennis, female    DOB: 14-Sep-1945, 71 y.o.   MRN: [id number]  Chief Complaint  Patient presents with  . Follow-up  . Medicare Wellness    Hypertension  This is a chronic problem. The problem is controlled. Pertinent negatives include no blurred vision, chest pain, headaches, malaise/fatigue, palpitations or shortness of breath.  Diabetes  She presents for her follow-up diabetic visit. She has type 2 diabetes mellitus. Pertinent negatives for hypoglycemia include no headaches. Pertinent negatives for diabetes include no blurred vision and no chest pain.    Patient is in today for 4-6 week follow up for a decrease in visual acuity. Patient states no new concerns. Patient has a Hx of HTN, Type II Dm. Patient has no acute concerns to note at this time. No polyuria or polydipsia. Trying to eat well but does better some days more than others. 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 Denies CP/palp/SOB/HA/congestion/fevers/GI or GU c/o. Taking meds as prescribed Patient Care Team: Bradd Canary, MD as PCP - General (Family Medicine) Cherlyn Roberts, MD as Consulting Physician (Dermatology) Charna Elizabeth, MD as Consulting Physician (Gastroenterology)   Past Medical History:  Diagnosis Date  . Allergy   . Anemia    h/o  . Asthma   . Asthma, mild intermittent, well-controlled 02/10/2014  . Blood transfusion without reported diagnosis   . Chicken pox as a child  . Diabetes mellitus    type-II, per Dr. Parke Simmers, no meds. , pt. reports weight loss has contributed  to control of diabetes.  She reports last HgbA1c- wnl.  . Diabetes mellitus 11/05/2010  . Diabetes mellitus type 2 in obese (HCC) 11/05/2010  . Diabetes mellitus type 2, uncontrolled (HCC) 11/05/2010  .  Esophageal reflux 02/10/2014  . GERD (gastroesophageal reflux disease)    rare use of OTC aid  . Hyperglycemia 09/02/2015  . Hyperlipidemia   . Hypertension   . Measles as a child  . Medicare annual wellness visit, subsequent 08/06/2014  . Mumps as a child  . Obesity 02/10/2014  . Pneumonia    as a child  . Portal vein thrombosis 03/09/2011   coumadin ended summer of 2013  . Preventative health care 08/12/2014  . Rash and nonspecific skin eruption 01/07/2015  . Seborrheic keratosis 02/10/2014   Follows with dermatology  . Sinusitis, acute 03/23/2016    Past Surgical History:  Procedure Laterality Date  . BREAST SURGERY     benign - cysts  . INSERTION OF MESH  03/16/2012   Procedure: INSERTION OF MESH;  Surgeon: Shelly Rubenstein, MD;  Location: [facility] OR;  Service: General;  Laterality: N/A;  . KNEE ARTHROSCOPY W/ MENISCAL REPAIR     both knees  . TONSILLECTOMY    . UMBILICAL HERNIA REPAIR  03/16/2012   Procedure: HERNIA REPAIR UMBILICAL ADULT;  Surgeon: Shelly Rubenstein, MD;  Location: [facility] OR;  Service: General;  Laterality: N/A;    Family History  Problem Relation Age of Onset  . Cancer Father     prostate  . Aneurysm Mother 36    brain  . Cancer Maternal Aunt     Social History   Social History  . Marital status: Divorced    Spouse  name: N/A  . Number of children: N/A  . Years of education: N/A   Occupational History  . Not on file.   Social History Main Topics  . Smoking status: Never Smoker  . Smokeless tobacco: Never Used     Comment: never used tobacco  . Alcohol use 0.0 oz/week     Comment: for holidays  . Drug use: No  . Sexual activity: No     Comment: lives alone    Other Topics Concern  . Not on file   Social History Narrative  . No narrative on file    Outpatient Medications Prior to Visit  Medication Sig Dispense Refill  . aspirin 81 MG tablet Take 81 mg by mouth daily.     Marland Kitchen azelastine (ASTELIN) 0.1 % nasal spray Place 2 sprays into both  nostrils 2 (two) times daily. Use in each nostril as directed 30 mL 6  . cetirizine (ZYRTEC) 10 MG tablet Take 10 mg by mouth daily with breakfast.     . enalapril (VASOTEC) 2.5 MG tablet Take 1 tablet (2.5 mg total) by mouth daily. 90 tablet 2  . EPIPEN 2-PAK 0.3 MG/0.3ML SOAJ injection     . fluticasone (FLONASE) 50 MCG/ACT nasal spray Place 2 sprays into both nostrils daily. 16 g 6  . glucose blood (ACCU-CHEK AVIVA PLUS) test strip Use once a day to check blood sugar.  DX  E11.9 100 each 5  . Lancets 30G MISC Use as directed once daily to check blood sugar.  Diagnosis code E11.9 100 each 5  . montelukast (SINGULAIR) 10 MG tablet Take 1 tablet (10 mg total) by mouth at bedtime. 90 tablet 3  . Multiple Vitamins-Minerals (MULTIVITAMIN WITH MINERALS) tablet Take 1 tablet by mouth daily.      . Omega-3 Fatty Acids (FISH OIL CONCENTRATE) 1000 MG CAPS Take by mouth 3 (three) times daily.    . ondansetron (ZOFRAN-ODT) 8 MG disintegrating tablet DISSOLVE 1 TABLET(8 MG) ON THE TONGUE EVERY 8 HOURS AS NEEDED FOR NAUSEA OR VOMITING 15 tablet 0  . PROVENTIL HFA 108 (90 BASE) MCG/ACT inhaler     . budesonide-formoterol (SYMBICORT) 80-4.5 MCG/ACT inhaler Inhale 1 puff into the lungs daily. Uses with seasonal allergies in the Spring and the Fall.    . enalapril (VASOTEC) 2.5 MG tablet TAKE 1 TABLET(2.5 MG) BY MOUTH DAILY 90 tablet 2  . glucose blood (ACCU-CHEK AVIVA PLUS) test strip Use as directed once daily to check blood sugar.  Diagnosis code E11.9 100 each 5  . ondansetron (ZOFRAN-ODT) 8 MG disintegrating tablet Take 1 tablet (8 mg total) by mouth every 8 (eight) hours as needed for nausea or vomiting. 15 tablet 0  . amoxicillin-clavulanate (AUGMENTIN) 875-125 MG tablet Take 1 tablet by mouth 2 (two) times daily. (Patient not taking: Reported on 06/15/2016) 20 tablet 0   No facility-administered medications prior to visit.     Allergies  Allergen Reactions  . Codeine Other (See Comments)    "makes me  crazy"  . Sulfa Antibiotics Anaphylaxis    "died 3 times".  . Oxycodone     Review of Systems  Constitutional: Negative for fever and malaise/fatigue.  HENT: Negative for congestion.   Eyes: Negative for blurred vision.  Respiratory: Negative for cough and shortness of breath.   Cardiovascular: Negative for chest pain, palpitations and leg swelling.  Gastrointestinal: Negative for diarrhea and vomiting.  Musculoskeletal: Negative for back pain.  Skin: Negative for rash.  Neurological: Negative for loss of  consciousness and headaches.       Objective:    Physical Exam  Constitutional: She is oriented to person, place, and time. She appears well-developed and well-nourished. No distress.  HENT:  Head: Normocephalic and atraumatic.  Eyes: Conjunctivae are normal.  Neck: Normal range of motion. No thyromegaly present.  Cardiovascular: Normal rate and regular rhythm.   Pulmonary/Chest: Effort normal and breath sounds normal. She has no wheezes.  Abdominal: Soft. Bowel sounds are normal. She exhibits no distension and no mass. There is no tenderness. There is no rebound and no guarding.  Ventral hernia soft, wide base nontender.   Musculoskeletal: She exhibits no edema or deformity.  Neurological: She is alert and oriented to person, place, and time.  Skin: Skin is warm and dry. She is not diaphoretic.  Psychiatric: She has a normal mood and affect.    BP 120/68 (BP Location: Left Arm, Patient Position: Sitting, Cuff Size: Large)   Pulse 70   Temp 98 F (36.7 C) (Oral)   Resp 16   Ht 5\' 6"  (1.676 m)   Wt 197 lb 3.2 oz (89.4 kg)   SpO2 97% Comment: RA  BMI 31.83 kg/m  Wt Readings from Last 3 Encounters:  06/15/16 197 lb 3.2 oz (89.4 kg)  03/23/16 192 lb 12.8 oz (87.5 kg)  12/20/15 193 lb 8 oz (87.8 kg)     Lab Results  Component Value Date   WBC 8.6 03/23/2016   HGB 14.1 03/23/2016   HCT 42.6 03/23/2016   PLT 198.0 03/23/2016   GLUCOSE 109 (H) 03/23/2016   CHOL  239 (H) 03/23/2016   TRIG 187.0 (H) 03/23/2016   HDL 59.70 03/23/2016   LDLDIRECT 111.2 02/06/2014   LDLCALC 142 (H) 03/23/2016   ALT 16 03/23/2016   AST 18 03/23/2016   NA 138 03/23/2016   K 4.3 03/23/2016   CL 103 03/23/2016   CREATININE 0.74 03/23/2016   BUN 21 03/23/2016   CO2 25 03/23/2016   TSH 0.72 03/23/2016   INR 2.2 09/21/2011   HGBA1C 7.0 (H) 03/23/2016   MICROALBUR 2.2 (H) 02/06/2014    Lab Results  Component Value Date   TSH 0.72 03/23/2016   Lab Results  Component Value Date   WBC 8.6 03/23/2016   HGB 14.1 03/23/2016   HCT 42.6 03/23/2016   MCV 87.2 03/23/2016   PLT 198.0 03/23/2016   Lab Results  Component Value Date   NA 138 03/23/2016   K 4.3 03/23/2016   CHLORIDE 110 (H) 10/10/2015   CO2 25 03/23/2016   GLUCOSE 109 (H) 03/23/2016   BUN 21 03/23/2016   CREATININE 0.74 03/23/2016   BILITOT 0.3 03/23/2016   ALKPHOS 64 03/23/2016   AST 18 03/23/2016   ALT 16 03/23/2016   PROT 6.7 03/23/2016   ALBUMIN 4.1 03/23/2016   CALCIUM 9.7 03/23/2016   ANIONGAP 8 10/10/2015   EGFR 86 (L) 10/10/2015   GFR 98.99 03/23/2016   Lab Results  Component Value Date   CHOL 239 (H) 03/23/2016   Lab Results  Component Value Date   HDL 59.70 03/23/2016   Lab Results  Component Value Date   LDLCALC 142 (H) 03/23/2016   Lab Results  Component Value Date   TRIG 187.0 (H) 03/23/2016   Lab Results  Component Value Date   CHOLHDL 4 03/23/2016   Lab Results  Component Value Date   HGBA1C 7.0 (H) 03/23/2016       Assessment & Plan:   Problem List Items  Addressed This Visit    Diabetes mellitus type 2 in obese (HCC)    hgba1c acceptable, minimize simple carbs. Increase exercise as tolerated. Continue current meds      Relevant Orders   Hemoglobin A1c   HTN (hypertension)    Well controlled, no changes to meds. Encouraged heart healthy diet such as the DASH diet and exercise as tolerated.       Relevant Orders   CBC   Comprehensive metabolic  panel   TSH   Hyperlipidemia    Encouraged heart healthy diet, increase exercise, avoid trans fats, consider a krill oil cap daily      Relevant Orders   Lipid panel   Obesity    Encouraged DASH diet, decrease po intake and increase exercise as tolerated. Needs 7-8 hours of sleep nightly. Avoid trans fats, eat small, frequent meals every 4-5 hours with lean proteins, complex carbs and healthy fats. Minimize simple carbs. Consider bariatric program       Other Visit Diagnoses    Encounter for Medicare annual wellness exam    -  Primary   Asymptomatic postmenopausal state       Relevant Orders   DG Bone Density      I have discontinued Ms. Fishman's amoxicillin-clavulanate. I am also having her maintain her multivitamin with minerals, cetirizine, aspirin, PROVENTIL HFA, FISH OIL CONCENTRATE, fluticasone, Lancets 30G, EPIPEN 2-PAK, montelukast, enalapril, azelastine, ondansetron, glucose blood, and budesonide-formoterol.  Meds ordered this encounter  Medications  . budesonide-formoterol (SYMBICORT) 80-4.5 MCG/ACT inhaler    Sig: Inhale 1 puff into the lungs daily. Uses with seasonal allergies in the Spring and the Fall.    Dispense:  1 Inhaler    Refill:  1    CMA served as scribe during this visit. History, Physical and Plan performed by medical provider. Documentation and orders reviewed and attested to.  Danise Edge, MD

## 2016-06-22 ENCOUNTER — Other Ambulatory Visit: Payer: Medicare Other

## 2016-07-21 ENCOUNTER — Telehealth: Payer: Self-pay | Admitting: Family Medicine

## 2016-07-21 NOTE — Telephone Encounter (Signed)
-----   Message from Katha Hamming sent at 07/21/2016 11:25 AM EDT ----- Regarding: bone density We have left three messages for [name] to call us to schedule her bone density.  No return call.  Thanks, Hoyle Sauer

## 2016-07-23 ENCOUNTER — Encounter: Payer: Self-pay | Admitting: Family Medicine

## 2016-08-27 ENCOUNTER — Other Ambulatory Visit: Payer: Self-pay | Admitting: Family Medicine

## 2016-09-04 ENCOUNTER — Encounter: Payer: Medicare Other | Admitting: Family Medicine

## 2016-09-10 ENCOUNTER — Ambulatory Visit (INDEPENDENT_AMBULATORY_CARE_PROVIDER_SITE_OTHER): Payer: Medicare Other | Admitting: Allergy & Immunology

## 2016-09-10 ENCOUNTER — Encounter: Payer: Self-pay | Admitting: Allergy & Immunology

## 2016-09-10 VITALS — BP 126/76 | HR 84 | Resp 20

## 2016-09-10 DIAGNOSIS — J310 Chronic rhinitis: Secondary | ICD-10-CM

## 2016-09-10 DIAGNOSIS — J4540 Moderate persistent asthma, uncomplicated: Secondary | ICD-10-CM | POA: Diagnosis not present

## 2016-09-10 DIAGNOSIS — K219 Gastro-esophageal reflux disease without esophagitis: Secondary | ICD-10-CM

## 2016-09-10 MED ORDER — METHYLPREDNISOLONE ACETATE 40 MG/ML IJ SUSP
40.0000 mg | Freq: Once | INTRAMUSCULAR | Status: AC
  Administered 2016-09-10: 40 mg via INTRAMUSCULAR

## 2016-09-10 NOTE — Patient Instructions (Addendum)
1. Moderate persistent asthma, uncomplicated - Lung function looked great today, but you are wheezing.  - I would recommend changing to [facility] one inhalation once daily to help with your wheezing.  - Memory Dance will replace your Symbicort.  - Daily controller medication(s): Breo 100/25 one puff once daily + montelukast 10mg  daily - Rescue medications: albuterol 4 puffs every 4-6 hours as needed - Asthma control goals:  * Full participation in all desired activities (may need albuterol before activity) * Albuterol use two time or less a week on average (not counting use with activity) * Cough interfering with sleep two time or less a month * Oral steroids no more than once a year * No hospitalizations  2. Chronic rhinitis - We will give you a steroid injection today. - Continue with Flonase 1-2 sprays per nostril daily. - Continue with Zyrtec (cetirizine) 10mg  daily.  3. Gastroesophageal reflux disease - Continue with Zantac 75mg  daily.  4. Return in about 6 months (around 03/12/2017).  Please inform us of any Emergency Department visits, hospitalizations, or changes in symptoms. Call us before going to the ED for breathing or allergy symptoms since we might be able to fit you in for a sick visit. Feel free to contact us anytime with any questions, problems, or concerns.  It was a pleasure to meet you today! Happy spring!   Websites that have reliable patient information: 1. American Academy of Asthma, Allergy, and Immunology: www.aaaai.org 2. Food Allergy Research and Education (FARE): foodallergy.org 3. Mothers of Asthmatics: http://www.asthmacommunitynetwork.org 4. American College of Allergy, Asthma, and Immunology: www.acaai.org

## 2016-09-10 NOTE — Progress Notes (Signed)
FOLLOW UP  Date of Service/Encounter:  09/10/16   Assessment:   Moderate persistent asthma, uncomplicated  Chronic allergic rhinitis  Gastroesophageal reflux disease    Asthma Reportables:  Severity: moderate persistent  Risk: low Control: well controlled   Plan/Recommendations:   1. Moderate persistent asthma, uncomplicated - Lung function looked great today, but you are wheezing.  - I would recommend changing to [facility] one inhalation once daily to help with your wheezing.  - Memory Dance will replace your Symbicort.  - Daily controller medication(s): Breo 100/25 one puff once daily + montelukast 10mg  daily - Rescue medications: albuterol 4 puffs every 4-6 hours as needed - Asthma control goals:  * Full participation in all desired activities (may need albuterol before activity) * Albuterol use two time or less a week on average (not counting use with activity) * Cough interfering with sleep two time or less a month * Oral steroids no more than once a year * No hospitalizations  2. Chronic allergic rhinitis - We will give you a steroid injection today. - Continue with Flonase 1-2 sprays per nostril daily. - Continue with Zyrtec (cetirizine) 10mg  daily.  3. Gastroesophageal reflux disease - Continue with Zantac 75mg  daily.  4. Return in about 6 months (around 03/12/2017).  Subjective:   Vicki Dennis is a 71 y.o. female presenting today for follow up of  Chief Complaint  Patient presents with  . Asthma  . Angioedema    HARVEST DEIST has a history of the following: Patient Active Problem List   Diagnosis Date Noted  . Sinusitis, acute 03/23/2016  . Rash and nonspecific skin eruption 01/07/2015  . Preventative health care 08/12/2014  . Medicare annual wellness visit, subsequent 08/06/2014  . Obesity 02/10/2014  . History of colonic polyps 02/10/2014  . Esophageal reflux 02/10/2014  . Seborrheic keratosis 02/10/2014  . Asthma, mild intermittent,  well-controlled 02/10/2014  . Chicken pox   . Measles   . Mumps   . Allergy   . Umbilical hernia 57/32/2025  . Portal vein thrombosis 03/09/2011  . Diabetes mellitus type 2 in obese ([place]) 11/05/2010  . HTN (hypertension) 11/05/2010  . Hyperlipidemia 11/05/2010    History obtained from: chart review and patient.  Nicholaus Corolla was referred by Mosie Lukes, MD.     Vicki Dennis is a 71 y.o. female presenting for a follow up visit. She was last seen in January 2017 by Dr. Ishmael Holter, who has since left the practice. At that time, she was continued on Singulair 10mg  daily As well as Symbicort 80/4.5 mcg 2 puffs in the morning and 2 puffs at night. She was continued on nasal saline rinses 2-4 times daily as well as Flonase and cetirizine.  Since the last visit, she has mostly done well. She has been doing well. She got rid of her carpeting and got wooden flooring instead. She uses a wet mop to clean up her floors. She also changed her pillows and is planning to change her mattress. She has made all of the lifestyle modifications as recommended. Today she is complaining of facial swelling and a rash. This is below her eyes. This does not itch but it is swollen mostly in the mornings. She does have cetirizine which she takes, but it does not seem to be working at all. This season has been worse than previously.   Vicki Dennis does have a history of asthma and is on low-dose Symbicort. She only takes 1 puff once per day. She doesn't  take anymore because she feels that the steroid in the Symbicort causes reflux. Currently, she has not tried increasing her Symbicort to see if that helps her shortness of breath. She does not use her rescue inhaler at all, because she "save zipper when she is dying". She is rather minimalist when it comes to medicines. For her allergic rhinitis, she is using ketotifen eye drops as needed. She uses the fluticasone regularly but the azelastine causes a terrible flavor in her throat and she  ends up vomiting. She is also on montelukast every day. This combination seems to control symptoms fairly well. She has a history of GERD, she is on Zantac once daily around 4pm, which seems to help.   Otherwise, there have been no changes to her past medical history, surgical history, family history, or social history. 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     Review of Systems: a 14-point review of systems is pertinent for what is mentioned in HPI.  Otherwise, all other systems were negative. Constitutional: negative other than that listed in the HPI Eyes: negative other than that listed in the HPI Ears, nose, mouth, throat, and face: negative other than that listed in the HPI Respiratory: negative other than that listed in the HPI Cardiovascular: negative other than that listed in the HPI Gastrointestinal: negative other than that listed in the HPI Genitourinary: negative other than that listed in the HPI Integument: negative other than that listed in the HPI Hematologic: negative other than that listed in the HPI Musculoskeletal: negative other than that listed in the HPI Neurological: negative other than that listed in the HPI Allergy/Immunologic: negative other than that listed in the HPI    Objective:   Blood pressure 126/76, pulse 84, resp. rate 20. There is no height or weight on file to calculate BMI.   Physical Exam:   General: Alert, interactive, in no acute distress. Pleasant female. Extremely talkative.  Eyes: No conjunctival injection present on the right, No conjunctival injection present on the left, PERRL bilaterally, No discharge on the right, No discharge on the left and No Horner-Trantas dots present Ears: Bilateral periorbital edema, Right TM pearly gray with normal light reflex, Left TM pearly gray with normal light reflex, Right TM intact without perforation and Left TM intact without perforation.  Nose/Throat: External nose within normal limits  and septum midline, turbinates edematous with clear discharge, post-pharynx erythematous without cobblestoning in the posterior oropharynx. Tonsils 2+ without exudates Neck: Supple without thyromegaly. Lungs: Clear to auscultation without wheezing, rhonchi or rales. No increased work of breathing. CV: Normal S1/S2, no murmurs. Capillary refill <2 seconds.  Skin: Warm and dry, without lesions or rashes. Neuro:   Grossly intact. No focal deficits appreciated. Responsive to questions.   Diagnostic studies:   Spirometry: results normal (FEV1: 2.12/108%, FVC: 2.49/98%, FEV1/FVC: 85%).    Spirometry consistent with normal pattern.   Allergy Studies: none     Salvatore Marvel, MD [facility]

## 2016-09-14 ENCOUNTER — Ambulatory Visit (HOSPITAL_BASED_OUTPATIENT_CLINIC_OR_DEPARTMENT_OTHER)
Admission: RE | Admit: 2016-09-14 | Discharge: 2016-09-14 | Disposition: A | Discharge status: Home / Self Care | Payer: Medicare Other | Source: Ambulatory Visit | Attending: Family Medicine | Admitting: Family Medicine

## 2016-09-14 ENCOUNTER — Other Ambulatory Visit: Payer: Self-pay | Admitting: Allergy & Immunology

## 2016-09-14 DIAGNOSIS — Z780 Asymptomatic menopausal state: Secondary | ICD-10-CM | POA: Diagnosis not present

## 2016-09-14 MED ORDER — FLUTICASONE FUROATE-VILANTEROL 100-25 MCG/INH IN AEPB: 1.0000 | INHALATION_SPRAY | Freq: Every day | RESPIRATORY_TRACT | 3 refills | Status: DC

## 2016-09-14 NOTE — Telephone Encounter (Signed)
Called patient. Left message that I sent in the [facility] to the [facility] on [address].

## 2016-09-14 NOTE — Telephone Encounter (Signed)
patient would like a script for BREO called into the [facility] on [address]

## 2016-09-15 ENCOUNTER — Other Ambulatory Visit (INDEPENDENT_AMBULATORY_CARE_PROVIDER_SITE_OTHER): Payer: Medicare Other

## 2016-09-15 DIAGNOSIS — I10 Essential (primary) hypertension: Secondary | ICD-10-CM | POA: Diagnosis not present

## 2016-09-15 DIAGNOSIS — E669 Obesity, unspecified: Secondary | ICD-10-CM | POA: Diagnosis not present

## 2016-09-15 DIAGNOSIS — E1169 Type 2 diabetes mellitus with other specified complication: Secondary | ICD-10-CM

## 2016-09-15 DIAGNOSIS — E782 Mixed hyperlipidemia: Secondary | ICD-10-CM

## 2016-09-15 LAB — COMPREHENSIVE METABOLIC PANEL
ALK PHOS: 60 U/L (ref 39–117)
ALT: 13 U/L (ref 0–35)
AST: 16 U/L (ref 0–37)
Albumin: 4 g/dL (ref 3.5–5.2)
BILIRUBIN TOTAL: 0.6 mg/dL (ref 0.2–1.2)
BUN: 21 mg/dL (ref 6–23)
CALCIUM: 9.7 mg/dL (ref 8.4–10.5)
CO2: 26 meq/L (ref 19–32)
Chloride: 104 mEq/L (ref 96–112)
Creatinine, Ser: 0.73 mg/dL (ref 0.40–1.20)
GFR: 100.42 mL/min (ref >60.00)
Glucose, Bld: 102 mg/dL — ABNORMAL HIGH (ref 70–99)
POTASSIUM: 4.1 meq/L (ref 3.5–5.1)
Sodium: 136 mEq/L (ref 135–145)
Total Protein: 6.6 g/dL (ref 6.0–8.3)

## 2016-09-15 LAB — CBC
HEMATOCRIT: 41.7 % (ref 36.0–46.0)
HEMOGLOBIN: 13.7 g/dL (ref 12.0–15.0)
MCHC: 32.9 g/dL (ref 30.0–36.0)
MCV: 89.1 fl (ref 78.0–100.0)
Platelets: 189 10*3/uL (ref 150.0–400.0)
RBC: 4.68 Mil/uL (ref 3.87–5.11)
RDW: 14.1 % (ref 11.5–15.5)
WBC: 5.8 10*3/uL (ref 4.0–10.5)

## 2016-09-15 LAB — LIPID PANEL
CHOLESTEROL: 203 mg/dL — AB (ref 0–200)
HDL: 63.2 mg/dL (ref >39.00)
LDL CALC: 121 mg/dL — AB (ref 0–99)
NonHDL: 140.24
TRIGLYCERIDES: 96 mg/dL (ref 0.0–149.0)
Total CHOL/HDL Ratio: 3
VLDL: 19.2 mg/dL (ref 0.0–40.0)

## 2016-09-15 LAB — TSH: TSH: 1.21 u[IU]/mL (ref 0.35–4.50)

## 2016-09-15 LAB — HEMOGLOBIN A1C: Hgb A1c MFr Bld: 7.2 % — ABNORMAL HIGH (ref 4.6–6.5)

## 2016-09-16 ENCOUNTER — Telehealth: Payer: Self-pay | Admitting: Family Medicine

## 2016-09-16 NOTE — Telephone Encounter (Signed)
Caller name:Jacynda Parson Relationship to patient: Can be reached:[phone] Pharmacy:  Reason for call:Requesting results of dexa scan

## 2016-09-17 NOTE — Telephone Encounter (Signed)
Called left the patient a detailed message of results.

## 2016-09-17 NOTE — Telephone Encounter (Signed)
Spoke with patient about bone scan and lab work she voiced her understanding. States she will speak with PCP next week if there are more concerns.   PC

## 2016-09-17 NOTE — Telephone Encounter (Signed)
Bone density is normal

## 2016-09-22 ENCOUNTER — Ambulatory Visit: Payer: Medicare Other | Admitting: Family Medicine

## 2016-09-29 ENCOUNTER — Ambulatory Visit (INDEPENDENT_AMBULATORY_CARE_PROVIDER_SITE_OTHER): Payer: Medicare Other | Admitting: Family Medicine

## 2016-09-29 ENCOUNTER — Encounter: Payer: Self-pay | Admitting: Family Medicine

## 2016-09-29 VITALS — BP 118/82 | HR 78 | Temp 97.7°F | Resp 16 | Ht 66.0 in | Wt 195.2 lb

## 2016-09-29 DIAGNOSIS — E669 Obesity, unspecified: Secondary | ICD-10-CM

## 2016-09-29 DIAGNOSIS — E782 Mixed hyperlipidemia: Secondary | ICD-10-CM

## 2016-09-29 DIAGNOSIS — Z0000 Encounter for general adult medical examination without abnormal findings: Secondary | ICD-10-CM | POA: Diagnosis not present

## 2016-09-29 DIAGNOSIS — K219 Gastro-esophageal reflux disease without esophagitis: Secondary | ICD-10-CM | POA: Diagnosis not present

## 2016-09-29 DIAGNOSIS — E1169 Type 2 diabetes mellitus with other specified complication: Secondary | ICD-10-CM | POA: Diagnosis not present

## 2016-09-29 NOTE — Progress Notes (Signed)
Patient ID: Vicki Dennis, female   DOB: [birth year], 71 y.o.   MRN: [id number]   Subjective:    Patient ID: Vicki Dennis, female    DOB: 1945/07/11, 71 y.o.   MRN: [id number]  Chief Complaint  Patient presents with  . Annual Exam    Pt here for phyical,    HPI Patient is in today for annual CPE and follow up on numerous medical concerns such as Diabetes and Hyperlipidemia. No recent febrile illness or hospitalizations. Denies CP/palp/SOB/HA/congestion/fevers/GI or GU c/o. Taking meds as prescribed. She feels well today, continues to maintain  A heart healthy diet most days. Is not exercising as much as she would like. No polyuria or polydipsia.  Past Medical History:  Diagnosis Date  . Allergy   . Anemia    h/o  . Asthma   . Asthma, mild intermittent, well-controlled 02/10/2014  . Blood transfusion without reported diagnosis   . Chicken pox as a child  . Diabetes mellitus    type-II, per Dr. Parke Simmers, no meds. , pt. reports weight loss has contributed  to control of diabetes.  She reports last HgbA1c- wnl.  . Diabetes mellitus 11/05/2010  . Diabetes mellitus type 2 in obese (HCC) 11/05/2010  . Diabetes mellitus type 2, uncontrolled (HCC) 11/05/2010  . Esophageal reflux 02/10/2014  . GERD (gastroesophageal reflux disease)    rare use of OTC aid  . Hyperglycemia 09/02/2015  . Hyperlipidemia   . Hypertension   . Measles as a child  . Medicare annual wellness visit, subsequent 08/06/2014  . Mumps as a child  . Obesity 02/10/2014  . Pneumonia    as a child  . Portal vein thrombosis 03/09/2011   coumadin ended summer of 2013  . Preventative health care 08/12/2014  . Rash and nonspecific skin eruption 01/07/2015  . Seborrheic keratosis 02/10/2014   Follows with dermatology  . Sinusitis, acute 03/23/2016    Past Surgical History:  Procedure Laterality Date  . BREAST SURGERY     benign - cysts  . INSERTION OF MESH  03/16/2012   Procedure: INSERTION OF MESH;  Surgeon: Shelly Rubenstein, MD;  Location: [facility] OR;  Service: General;  Laterality: N/A;  . KNEE ARTHROSCOPY W/ MENISCAL REPAIR     both knees  . TONSILLECTOMY    . UMBILICAL HERNIA REPAIR  03/16/2012   Procedure: HERNIA REPAIR UMBILICAL ADULT;  Surgeon: Shelly Rubenstein, MD;  Location: [facility] OR;  Service: General;  Laterality: N/A;    Family History  Problem Relation Age of Onset  . Cancer Father        prostate  . Aneurysm Mother 1       brain  . Cancer Maternal Aunt     Social History   Social History  . Marital status: Divorced    Spouse name: N/A  . Number of children: N/A  . Years of education: N/A   Occupational History  . Not on file.   Social History Main Topics  . Smoking status: Never Smoker  . Smokeless tobacco: Never Used     Comment: never used tobacco  . Alcohol use 0.0 oz/week     Comment: for holidays  . Drug use: No  . Sexual activity: No     Comment: lives alone    Other Topics Concern  . Not on file   Social History Narrative  . No narrative on file    Outpatient Medications Prior to Visit  Medication Sig Dispense Refill  .  aspirin 81 MG tablet Take 81 mg by mouth daily.     Marland Kitchen azelastine (ASTELIN) 0.1 % nasal spray Place 2 sprays into both nostrils 2 (two) times daily. Use in each nostril as directed 30 mL 6  . budesonide-formoterol (SYMBICORT) 80-4.5 MCG/ACT inhaler Inhale 1 puff into the lungs daily. Uses with seasonal allergies in the Spring and the Fall. 1 Inhaler 1  . cetirizine (ZYRTEC) 10 MG tablet Take 10 mg by mouth daily with breakfast.     . EPIPEN 2-PAK 0.3 MG/0.3ML SOAJ injection     . fluticasone (FLONASE) 50 MCG/ACT nasal spray Place 2 sprays into both nostrils daily. 16 g 6  . fluticasone furoate-vilanterol (BREO ELLIPTA) 100-25 MCG/INH AEPB Inhale 1 puff into the lungs daily. 60 each 3  . glucose blood (ACCU-CHEK AVIVA PLUS) test strip Use once a day to check blood sugar.  DX  E11.9 100 each 5  . Lancets 30G MISC Use as directed once daily to  check blood sugar.  Diagnosis code E11.9 100 each 5  . montelukast (SINGULAIR) 10 MG tablet Take 1 tablet (10 mg total) by mouth at bedtime. 90 tablet 3  . Multiple Vitamins-Minerals (MULTIVITAMIN WITH MINERALS) tablet Take 1 tablet by mouth daily.      . Omega-3 Fatty Acids (FISH OIL CONCENTRATE) 1000 MG CAPS Take by mouth 3 (three) times daily.    . ondansetron (ZOFRAN-ODT) 8 MG disintegrating tablet DISSOLVE 1 TABLET(8 MG) ON THE TONGUE EVERY 8 HOURS AS NEEDED FOR NAUSEA OR VOMITING 15 tablet 0  . PROVENTIL HFA 108 (90 BASE) MCG/ACT inhaler     . enalapril (VASOTEC) 2.5 MG tablet TAKE 1 TABLET BY MOUTH DAILY 90 tablet 0   No facility-administered medications prior to visit.     Allergies  Allergen Reactions  . Codeine Other (See Comments)    "makes me crazy"  . Sulfa Antibiotics Anaphylaxis    "died 3 times".  . Oxycodone     Review of Systems  Constitutional: Negative for chills, fever and malaise/fatigue.  HENT: Negative for congestion and hearing loss.   Eyes: Negative for discharge.  Respiratory: Negative for cough, sputum production and shortness of breath.   Cardiovascular: Negative for chest pain, palpitations and leg swelling.  Gastrointestinal: Negative for abdominal pain, blood in stool, constipation, diarrhea, heartburn, nausea and vomiting.  Genitourinary: Negative for dysuria, frequency, hematuria and urgency.  Musculoskeletal: Negative for back pain, falls and myalgias.  Skin: Negative for rash.  Neurological: Negative for dizziness, sensory change, loss of consciousness, weakness and headaches.  Endo/Heme/Allergies: Negative for environmental allergies. Does not bruise/bleed easily.  Psychiatric/Behavioral: Negative for depression and suicidal ideas. The patient is not nervous/anxious and does not have insomnia.        Objective:    Physical Exam  Constitutional: She is oriented to person, place, and time. She appears well-developed and well-nourished. No  distress.  HENT:  Head: Normocephalic and atraumatic.  Eyes: Conjunctivae are normal.  Neck: Neck supple. No thyromegaly present.  Cardiovascular: Normal rate, regular rhythm and normal heart sounds.   No murmur heard. Pulmonary/Chest: Effort normal and breath sounds normal. No respiratory distress.  Abdominal: Soft. Bowel sounds are normal. She exhibits no distension and no mass. There is no tenderness.  Musculoskeletal: She exhibits no edema.  Lymphadenopathy:    She has no cervical adenopathy.  Neurological: She is alert and oriented to person, place, and time.  Skin: Skin is warm and dry.  Psychiatric: She has a normal mood and  affect. Her behavior is normal.    BP 118/82 (BP Location: Right Arm, Patient Position: Sitting, Cuff Size: Normal)   Pulse 78   Temp 97.7 F (36.5 C) (Oral)   Resp 16   Ht 5\' 6"  (1.676 m)   Wt 195 lb 3.2 oz (88.5 kg)   SpO2 98%   BMI 31.51 kg/m  Wt Readings from Last 3 Encounters:  09/29/16 195 lb 3.2 oz (88.5 kg)  06/15/16 197 lb 3.2 oz (89.4 kg)  03/23/16 192 lb 12.8 oz (87.5 kg)     Lab Results  Component Value Date   WBC 5.8 09/15/2016   HGB 13.7 09/15/2016   HCT 41.7 09/15/2016   PLT 189.0 09/15/2016   GLUCOSE 102 (H) 09/15/2016   CHOL 203 (H) 09/15/2016   TRIG 96.0 09/15/2016   HDL 63.20 09/15/2016   LDLDIRECT 111.2 02/06/2014   LDLCALC 121 (H) 09/15/2016   ALT 13 09/15/2016   AST 16 09/15/2016   NA 136 09/15/2016   K 4.1 09/15/2016   CL 104 09/15/2016   CREATININE 0.73 09/15/2016   BUN 21 09/15/2016   CO2 26 09/15/2016   TSH 1.21 09/15/2016   INR 2.2 09/21/2011   HGBA1C 7.2 (H) 09/15/2016   MICROALBUR 2.2 (H) 02/06/2014    Lab Results  Component Value Date   TSH 1.21 09/15/2016   Lab Results  Component Value Date   WBC 5.8 09/15/2016   HGB 13.7 09/15/2016   HCT 41.7 09/15/2016   MCV 89.1 09/15/2016   PLT 189.0 09/15/2016   Lab Results  Component Value Date   NA 136 09/15/2016   K 4.1 09/15/2016   CHLORIDE  110 (H) 10/10/2015   CO2 26 09/15/2016   GLUCOSE 102 (H) 09/15/2016   BUN 21 09/15/2016   CREATININE 0.73 09/15/2016   BILITOT 0.6 09/15/2016   ALKPHOS 60 09/15/2016   AST 16 09/15/2016   ALT 13 09/15/2016   PROT 6.6 09/15/2016   ALBUMIN 4.0 09/15/2016   CALCIUM 9.7 09/15/2016   ANIONGAP 8 10/10/2015   EGFR 86 (L) 10/10/2015   GFR 100.42 09/15/2016   Lab Results  Component Value Date   CHOL 203 (H) 09/15/2016   Lab Results  Component Value Date   HDL 63.20 09/15/2016   Lab Results  Component Value Date   LDLCALC 121 (H) 09/15/2016   Lab Results  Component Value Date   TRIG 96.0 09/15/2016   Lab Results  Component Value Date   CHOLHDL 3 09/15/2016   Lab Results  Component Value Date   HGBA1C 7.2 (H) 09/15/2016       Assessment & Plan:   Problem List Items Addressed This Visit    Diabetes mellitus type 2 in obese (HCC)    hgba1c acceptable, minimize simple carbs. Increase exercise as tolerated. Continue current meds      Hyperlipidemia    Encouraged heart healthy diet, increase exercise, avoid trans fats, consider a krill oil cap daily      Obesity    Encouraged DASH diet, decrease intake and increase exercise as tolerated. Needs 7-8 hours of sleep nightly. Avoid trans fats, eat small, frequent meals every 4-5 hours with lean proteins, complex carbs and healthy fats. Minimize simple carbs      Preventative health care    Patient encouraged to maintain heart healthy diet, regular exercise, adequate sleep. Consider daily probiotics. Take medications as prescribed         I have discontinued Ms. Rietz's enalapril. I am also  having her maintain her multivitamin with minerals, cetirizine, aspirin, PROVENTIL HFA, FISH OIL CONCENTRATE, fluticasone, Lancets 30G, EPIPEN 2-PAK, montelukast, azelastine, ondansetron, glucose blood, budesonide-formoterol, and fluticasone furoate-vilanterol.  No orders of the defined types were placed in this encounter.   CMA  served as Neurosurgeon during this visit. History, Physical and Plan performed by medical provider. Documentation and orders reviewed and attested to.  Danise Edge, MD

## 2016-09-29 NOTE — Assessment & Plan Note (Signed)
Encouraged DASH diet, decrease intake and increase exercise as tolerated. Needs 7-8 hours of sleep nightly. Avoid trans fats, eat small, frequent meals every 4-5 hours with lean proteins, complex carbs and healthy fats. Minimize simple carbs

## 2016-09-29 NOTE — Assessment & Plan Note (Signed)
Encouraged heart healthy diet, increase exercise, avoid trans fats, consider a krill oil cap daily 

## 2016-09-29 NOTE — Assessment & Plan Note (Signed)
hgba1c acceptable, minimize simple carbs. Increase exercise as tolerated. Continue current meds 

## 2016-09-29 NOTE — Patient Instructions (Signed)
Preventive Care 39 Years and Older, Female Preventive care refers to lifestyle choices and visits with your health care provider that can promote health and wellness. What does preventive care include?  A yearly physical exam. This is also called an annual well check.  Dental exams once or twice a year.  Routine eye exams. Ask your health care provider how often you should have your eyes checked.  Personal lifestyle choices, including: ? Daily care of your teeth and gums. ? Regular physical activity. ? Eating a healthy diet. ? Avoiding tobacco and drug use. ? Limiting alcohol use. ? Practicing safe sex. ? Taking low-dose aspirin every day. ? Taking vitamin and mineral supplements as recommended by your health care provider. What happens during an annual well check? The services and screenings done by your health care provider during your annual well check will depend on your age, overall health, lifestyle risk factors, and family history of disease. Counseling Your health care provider may ask you questions about your:  Alcohol use.  Tobacco use.  Drug use.  Emotional well-being.  Home and relationship well-being.  Sexual activity.  Eating habits.  History of falls.  Memory and ability to understand (cognition).  Work and work Statistician.  Reproductive health.  Screening You may have the following tests or measurements:  Height, weight, and BMI.  Blood pressure.  Lipid and cholesterol levels. These may be checked every 5 years, or more frequently if you are over 62 years old.  Skin check.  Lung cancer screening. You may have this screening every year starting at age 31 if you have a 30-pack-year history of smoking and currently smoke or have quit within the past 15 years.  Fecal occult blood test (FOBT) of the stool. You may have this test every year starting at age 61.  Flexible sigmoidoscopy or colonoscopy. You may have a sigmoidoscopy every 5 years or  a colonoscopy every 10 years starting at age 76.  Hepatitis C blood test.  Hepatitis B blood test.  Sexually transmitted disease (STD) testing.  Diabetes screening. This is done by checking your blood sugar (glucose) after you have not eaten for a while (fasting). You may have this done every 1-3 years.  Bone density scan. This is done to screen for osteoporosis. You may have this done starting at age 31.  Mammogram. This may be done every 1-2 years. Talk to your health care provider about how often you should have regular mammograms.  Talk with your health care provider about your test results, treatment options, and if necessary, the need for more tests. Vaccines Your health care provider may recommend certain vaccines, such as:  Influenza vaccine. This is recommended every year.  Tetanus, diphtheria, and acellular pertussis (Tdap, Td) vaccine. You may need a Td booster every 10 years.  Varicella vaccine. You may need this if you have not been vaccinated.  Zoster vaccine. You may need this after age 32.  Measles, mumps, and rubella (MMR) vaccine. You may need at least one dose of MMR if you were born in 1957 or later. You may also need a second dose.  Pneumococcal 13-valent conjugate (PCV13) vaccine. One dose is recommended after age 88.  Pneumococcal polysaccharide (PPSV23) vaccine. One dose is recommended after age 73.  Meningococcal vaccine. You may need this if you have certain conditions.  Hepatitis A vaccine. You may need this if you have certain conditions or if you travel or work in places where you may be exposed to hepatitis  A.  Hepatitis B vaccine. You may need this if you have certain conditions or if you travel or work in places where you may be exposed to hepatitis B.  Haemophilus influenzae type b (Hib) vaccine. You may need this if you have certain conditions.  Talk to your health care provider about which screenings and vaccines you need and how often you  need them. This information is not intended to replace advice given to you by your health care provider. Make sure you discuss any questions you have with your health care provider. Document Released: 04/26/2015 Document Revised: 12/18/2015 Document Reviewed: 01/29/2015 Elsevier Interactive Patient Education  2017 [employer].

## 2016-09-29 NOTE — Assessment & Plan Note (Signed)
Avoid offending foods, start probiotics. Do not eat large meals in late evening and consider raising head of bed.  

## 2016-09-29 NOTE — Assessment & Plan Note (Signed)
Patient encouraged to maintain heart healthy diet, regular exercise, adequate sleep. Consider daily probiotics. Take medications as prescribed 

## 2016-12-02 ENCOUNTER — Other Ambulatory Visit: Payer: Self-pay | Admitting: *Deleted

## 2016-12-02 MED ORDER — EPIPEN 2-PAK 0.3 MG/0.3ML IJ SOAJ: INTRAMUSCULAR | 1 refills | Status: DC

## 2016-12-02 NOTE — Telephone Encounter (Signed)
Refilled epipen pt last seen 08/2016.

## 2016-12-20 ENCOUNTER — Other Ambulatory Visit: Payer: Self-pay | Admitting: Family Medicine

## 2017-03-02 ENCOUNTER — Encounter: Payer: Self-pay | Admitting: Family Medicine

## 2017-03-02 ENCOUNTER — Ambulatory Visit: Payer: Medicare Other | Admitting: Family Medicine

## 2017-03-02 DIAGNOSIS — J019 Acute sinusitis, unspecified: Secondary | ICD-10-CM

## 2017-03-02 DIAGNOSIS — E1169 Type 2 diabetes mellitus with other specified complication: Secondary | ICD-10-CM | POA: Diagnosis not present

## 2017-03-02 DIAGNOSIS — E782 Mixed hyperlipidemia: Secondary | ICD-10-CM | POA: Diagnosis not present

## 2017-03-02 DIAGNOSIS — E669 Obesity, unspecified: Secondary | ICD-10-CM

## 2017-03-02 MED ORDER — BENZONATATE 100 MG PO CAPS: 100.0000 mg | ORAL_CAPSULE | Freq: Two times a day (BID) | ORAL | 0 refills | Status: DC | PRN

## 2017-03-02 MED ORDER — CEFDINIR 300 MG PO CAPS: 300.0000 mg | ORAL_CAPSULE | Freq: Two times a day (BID) | ORAL | 0 refills | Status: DC

## 2017-03-02 NOTE — Assessment & Plan Note (Signed)
Encouraged DASH diet, decrease po intake and increase exercise as tolerated. Needs 7-8 hours of sleep nightly. Avoid trans fats, eat small, frequent meals every 4-5 hours with lean proteins, complex carbs and healthy fats. Minimize simple carbs 

## 2017-03-02 NOTE — Progress Notes (Signed)
Subjective:  I acted as a Education administrator for [employer]. Yancey Flemings, [place]   Patient ID: Vicki Dennis, female    DOB: 1945-06-05, 71 y.o.   MRN: [id number]  Chief Complaint  Patient presents with  . Nasal Congestion    on going for 1 week    HPI  Patient is in today for an acute visit and is feeling poorly. She has congestion. No recent febrile illness or recent hospitalization. Denies CP/palp/SOB/HA/fevers or GU c/o. Taking meds as prescribed. Has felt poorly for several days. She has noted some nausea with scant vomiting and diarrhea. Notes facial pressure and malaise. Congestion in head and PND drip mostly.   Patient Care Team: Mosie Lukes, MD as PCP - General (Family Medicine) Druscilla Brownie, MD as Consulting Physician (Dermatology) Juanita Craver, MD as Consulting Physician (Gastroenterology)   Past Medical History:  Diagnosis Date  . Allergy   . Anemia    h/o  . Asthma   . Asthma, mild intermittent, well-controlled 02/10/2014  . Blood transfusion without reported diagnosis   . Chicken pox as a child  . Diabetes mellitus    type-II, per Dr. Criss Rosales, no meds. , pt. reports weight loss has contributed  to control of diabetes.  She reports last HgbA1c- wnl.  . Diabetes mellitus 11/05/2010  . Diabetes mellitus type 2 in obese ([place]) 11/05/2010  . Diabetes mellitus type 2, uncontrolled ([place]) 11/05/2010  . Esophageal reflux 02/10/2014  . GERD (gastroesophageal reflux disease)    rare use of OTC aid  . Hyperglycemia 09/02/2015  . Hyperlipidemia   . Hypertension   . Measles as a child  . Medicare annual wellness visit, subsequent 08/06/2014  . Mumps as a child  . Obesity 02/10/2014  . Pneumonia    as a child  . Portal vein thrombosis 03/09/2011   coumadin ended summer of 2013  . Preventative health care 08/12/2014  . Rash and nonspecific skin eruption 01/07/2015  . Seborrheic keratosis 02/10/2014   Follows with dermatology  . Sinusitis, acute 03/23/2016    Past Surgical History:    Procedure Laterality Date  . BREAST SURGERY     benign - cysts  . INSERTION OF MESH  03/16/2012   Procedure: INSERTION OF MESH;  Surgeon: Harl Bowie, MD;  Location: [facility];  Service: General;  Laterality: N/A;  . KNEE ARTHROSCOPY W/ MENISCAL REPAIR     both knees  . TONSILLECTOMY    . UMBILICAL HERNIA REPAIR  03/16/2012   Procedure: HERNIA REPAIR UMBILICAL ADULT;  Surgeon: Harl Bowie, MD;  Location: [facility];  Service: General;  Laterality: N/A;    Family History  Problem Relation Age of Onset  . Cancer Father        prostate  . Aneurysm Mother 78       brain  . Cancer Maternal Aunt     Social History   Socioeconomic History  . Marital status: Divorced    Spouse name: Not on file  . Number of children: Not on file  . Years of education: Not on file  . Highest education level: Not on file  Social Needs  . Financial resource strain: Not on file  . Food insecurity - worry: Not on file  . Food insecurity - inability: Not on file  . Transportation needs - medical: Not on file  . Transportation needs - non-medical: Not on file  Occupational History  . Not on file  Tobacco Use  . Smoking status: Never Smoker  .  Smokeless tobacco: Never Used  . Tobacco comment: never used tobacco  Substance and Sexual Activity  . Alcohol use: Yes    Alcohol/week: 0.0 oz    Comment: for holidays  . Drug use: No  . Sexual activity: No    Comment: lives alone   Other Topics Concern  . Not on file  Social History Narrative  . Not on file    Outpatient Medications Prior to Visit  Medication Sig Dispense Refill  . aspirin 81 MG tablet Take 81 mg by mouth daily.     Marland Kitchen azelastine (ASTELIN) 0.1 % nasal spray Place 2 sprays into both nostrils 2 (two) times daily. Use in each nostril as directed 30 mL 6  . budesonide-formoterol (SYMBICORT) 80-4.5 MCG/ACT inhaler Inhale 1 puff into the lungs daily. Uses with seasonal allergies in the Spring and the Fall. 1 Inhaler 1  . cetirizine  (ZYRTEC) 10 MG tablet Take 10 mg by mouth daily with breakfast.     . EPIPEN 2-PAK 0.3 MG/0.3ML SOAJ injection Use as directed for severe allergic reactions. 2 Device 1  . fluticasone (FLONASE) 50 MCG/ACT nasal spray Place 2 sprays into both nostrils daily. 16 g 6  . fluticasone furoate-vilanterol (BREO ELLIPTA) 100-25 MCG/INH AEPB Inhale 1 puff into the lungs daily. 60 each 3  . glucose blood (ACCU-CHEK AVIVA PLUS) test strip Use once a day to check blood sugar.  DX  E11.9 100 each 5  . Lancets 30G MISC Use as directed once daily to check blood sugar.  Diagnosis code E11.9 100 each 5  . montelukast (SINGULAIR) 10 MG tablet TAKE 1 TABLET (10 MG TOTAL) BY MOUTH AT BEDTIME. 90 tablet 2  . Multiple Vitamins-Minerals (MULTIVITAMIN WITH MINERALS) tablet Take 1 tablet by mouth daily.      . Omega-3 Fatty Acids (FISH OIL CONCENTRATE) 1000 MG CAPS Take by mouth 3 (three) times daily.    . ondansetron (ZOFRAN-ODT) 8 MG disintegrating tablet DISSOLVE 1 TABLET(8 MG) ON THE TONGUE EVERY 8 HOURS AS NEEDED FOR NAUSEA OR VOMITING 15 tablet 0  . PROVENTIL HFA 108 (90 BASE) MCG/ACT inhaler      No facility-administered medications prior to visit.     Allergies  Allergen Reactions  . Codeine Other (See Comments)    "makes me crazy"  . Sulfa Antibiotics Anaphylaxis    "died 3 times".  . Oxycodone     Review of Systems  Constitutional: Negative for fever and malaise/fatigue.  HENT: Positive for congestion.   Eyes: Negative for blurred vision.  Respiratory: Positive for cough and sputum production. Negative for shortness of breath.   Cardiovascular: Negative for chest pain, palpitations, claudication and leg swelling.  Gastrointestinal: Positive for diarrhea, nausea and vomiting. Negative for abdominal pain, blood in stool, constipation and melena.  Genitourinary: Negative for dysuria and frequency.  Musculoskeletal: Negative for falls.  Skin: Negative for rash.  Neurological: Negative for dizziness,  tremors, loss of consciousness and headaches.  Endo/Heme/Allergies: Negative for environmental allergies.  Psychiatric/Behavioral: Negative for depression. The patient is not nervous/anxious.        Objective:    Physical Exam  Constitutional: She is oriented to person, place, and time. She appears well-developed and well-nourished. No distress.  HENT:  Head: Normocephalic and atraumatic.  Nose: Nose normal.  Eyes: Right eye exhibits no discharge. Left eye exhibits no discharge.  Neck: Normal range of motion. Neck supple.  Cardiovascular: Normal rate and regular rhythm.  No murmur heard. Pulmonary/Chest: Effort normal and breath sounds  Subjective:  I acted as a Education administrator for [employer]. Yancey Flemings, [place]   Patient ID: Vicki Dennis, female    DOB: 1945-06-05, 71 y.o.   MRN: [id number]  Chief Complaint  Patient presents with  . Nasal Congestion    on going for 1 week    HPI  Patient is in today for an acute visit and is feeling poorly. She has congestion. No recent febrile illness or recent hospitalization. Denies CP/palp/SOB/HA/fevers or GU c/o. Taking meds as prescribed. Has felt poorly for several days. She has noted some nausea with scant vomiting and diarrhea. Notes facial pressure and malaise. Congestion in head and PND drip mostly.   Patient Care Team: Mosie Lukes, MD as PCP - General (Family Medicine) Druscilla Brownie, MD as Consulting Physician (Dermatology) Juanita Craver, MD as Consulting Physician (Gastroenterology)   Past Medical History:  Diagnosis Date  . Allergy   . Anemia    h/o  . Asthma   . Asthma, mild intermittent, well-controlled 02/10/2014  . Blood transfusion without reported diagnosis   . Chicken pox as a child  . Diabetes mellitus    type-II, per Dr. Criss Rosales, no meds. , pt. reports weight loss has contributed  to control of diabetes.  She reports last HgbA1c- wnl.  . Diabetes mellitus 11/05/2010  . Diabetes mellitus type 2 in obese ([place]) 11/05/2010  . Diabetes mellitus type 2, uncontrolled ([place]) 11/05/2010  . Esophageal reflux 02/10/2014  . GERD (gastroesophageal reflux disease)    rare use of OTC aid  . Hyperglycemia 09/02/2015  . Hyperlipidemia   . Hypertension   . Measles as a child  . Medicare annual wellness visit, subsequent 08/06/2014  . Mumps as a child  . Obesity 02/10/2014  . Pneumonia    as a child  . Portal vein thrombosis 03/09/2011   coumadin ended summer of 2013  . Preventative health care 08/12/2014  . Rash and nonspecific skin eruption 01/07/2015  . Seborrheic keratosis 02/10/2014   Follows with dermatology  . Sinusitis, acute 03/23/2016    Past Surgical History:    Procedure Laterality Date  . BREAST SURGERY     benign - cysts  . INSERTION OF MESH  03/16/2012   Procedure: INSERTION OF MESH;  Surgeon: Harl Bowie, MD;  Location: [facility];  Service: General;  Laterality: N/A;  . KNEE ARTHROSCOPY W/ MENISCAL REPAIR     both knees  . TONSILLECTOMY    . UMBILICAL HERNIA REPAIR  03/16/2012   Procedure: HERNIA REPAIR UMBILICAL ADULT;  Surgeon: Harl Bowie, MD;  Location: [facility];  Service: General;  Laterality: N/A;    Family History  Problem Relation Age of Onset  . Cancer Father        prostate  . Aneurysm Mother 78       brain  . Cancer Maternal Aunt     Social History   Socioeconomic History  . Marital status: Divorced    Spouse name: Not on file  . Number of children: Not on file  . Years of education: Not on file  . Highest education level: Not on file  Social Needs  . Financial resource strain: Not on file  . Food insecurity - worry: Not on file  . Food insecurity - inability: Not on file  . Transportation needs - medical: Not on file  . Transportation needs - non-medical: Not on file  Occupational History  . Not on file  Tobacco Use  . Smoking status: Never Smoker  .  Component Value Date   CHOLHDL 3 09/15/2016   Lab Results  Component Value Date   HGBA1C 7.2 (H) 09/15/2016         Assessment & Plan:   Problem List Items Addressed This Visit    Diabetes mellitus type 2 in obese ([facility])    hgba1c  acceptable, minimize simple carbs. Increase exercise as tolerated. Continue current meds      Relevant Orders   Hemoglobin A1c   CBC   Comprehensive metabolic panel   TSH   Hyperlipidemia    Encouraged heart healthy diet, increase exercise, avoid trans fats, consider a krill oil cap daily      Relevant Orders   Lipid panel   Obesity    Encouraged DASH diet, decrease po intake and increase exercise as tolerated. Needs 7-8 hours of sleep nightly. Avoid trans fats, eat small, frequent meals every 4-5 hours with lean proteins, complex carbs and healthy fats. Minimize simple carbs      Sinusitis    Cefdinir 300 mg po bid, Mucinex bid, probiotic, elderberry      Relevant Medications   cefdinir (OMNICEF) 300 MG capsule   benzonatate (TESSALON) 100 MG capsule      I am having Vicki Dennis start on cefdinir and benzonatate. I am also having her maintain her multivitamin with minerals, cetirizine, aspirin, PROVENTIL HFA, FISH OIL CONCENTRATE, fluticasone, Lancets 30G, azelastine, ondansetron, glucose blood, budesonide-formoterol, fluticasone furoate-vilanterol, EPIPEN 2-PAK, and montelukast.  Meds ordered this encounter  Medications  . cefdinir (OMNICEF) 300 MG capsule    Sig: Take 1 capsule (300 mg total) by mouth 2 (two) times daily for 10 days.    Dispense:  20 capsule    Refill:  0  . benzonatate (TESSALON) 100 MG capsule    Sig: Take 1 capsule (100 mg total) by mouth 2 (two) times daily as needed for cough.    Dispense:  20 capsule    Refill:  0    CMA served as scribe during this visit. History, Physical and Plan performed by medical provider. Documentation and orders reviewed and attested to.  Danise Edge, MD

## 2017-03-02 NOTE — Assessment & Plan Note (Signed)
Encouraged heart healthy diet, increase exercise, avoid trans fats, consider a krill oil cap daily 

## 2017-03-02 NOTE — Patient Instructions (Addendum)
shingrix is the new shingles shot, 2 shots over 2-6 months. Can get at pharmacy  Encouraged increased rest and hydration, add probiotics, zinc such as Coldeze or Xicam. Treat fevers as needed, elderberry, vitamin c 500 to 1000 mg daily, mucinex twice Carbohydrate Counting for Diabetes Mellitus, Adult Carbohydrate counting is a method for keeping track of how many carbohydrates you eat. Eating carbohydrates naturally increases the amount of sugar (glucose) in the blood. Counting how many carbohydrates you eat helps keep your blood glucose within normal limits, which helps you manage your diabetes (diabetes mellitus). It is important to know how many carbohydrates you can safely have in each meal. This is different for every person. A diet and nutrition specialist (registered dietitian) can help you make a meal plan and calculate how many carbohydrates you should have at each meal and snack. Carbohydrates are found in the following foods:  Grains, such as breads and cereals.  Dried beans and soy products.  Starchy vegetables, such as potatoes, peas, and corn.  Fruit and fruit juices.  Milk and yogurt.  Sweets and snack foods, such as cake, cookies, candy, chips, and soft drinks.  How do I count carbohydrates? There are two ways to count carbohydrates in food. You can use either of the methods or a combination of both. Reading "Nutrition Facts" on packaged food The "Nutrition Facts" list is included on the labels of almost all packaged foods and beverages in the [place] It includes:  The serving size.  Information about nutrients in each serving, including the grams (g) of carbohydrate per serving.  To use the "Nutrition Facts":  Decide how many servings you will have.  Multiply the number of servings by the number of carbohydrates per serving.  The resulting number is the total amount of carbohydrates that you will be having.  Learning standard serving sizes of other foods When you  eat foods containing carbohydrates that are not packaged or do not include "Nutrition Facts" on the label, you need to measure the servings in order to count the amount of carbohydrates:  Measure the foods that you will eat with a food scale or measuring cup, if needed.  Decide how many standard-size servings you will eat.  Multiply the number of servings by 15. Most carbohydrate-rich foods have about 15 g of carbohydrates per serving. ? For example, if you eat 8 oz (170 g) of strawberries, you will have eaten 2 servings and 30 g of carbohydrates (2 servings x 15 g = 30 g).  For foods that have more than one food mixed, such as soups and casseroles, you must count the carbohydrates in each food that is included.  The following list contains standard serving sizes of common carbohydrate-rich foods. Each of these servings has about 15 g of carbohydrates:   hamburger bun or  English muffin.   oz (15 mL) syrup.   oz (14 g) jelly.  1 slice of bread.  1 six-inch tortilla.  3 oz (85 g) cooked rice or pasta.  4 oz (113 g) cooked dried beans.  4 oz (113 g) starchy vegetable, such as peas, corn, or potatoes.  4 oz (113 g) hot cereal.  4 oz (113 g) mashed potatoes or  of a large baked potato.  4 oz (113 g) canned or frozen fruit.  4 oz (120 mL) fruit juice.  4-6 crackers.  6 chicken nuggets.  6 oz (170 g) unsweetened dry cereal.  6 oz (170 g) plain fat-free yogurt or yogurt sweetened  with artificial sweeteners.  8 oz (240 mL) milk.  8 oz (170 g) fresh fruit or one small piece of fruit.  24 oz (680 g) popped popcorn.  Example of carbohydrate counting Sample meal  3 oz (85 g) chicken breast.  6 oz (170 g) brown rice.  4 oz (113 g) corn.  8 oz (240 mL) milk.  8 oz (170 g) strawberries with sugar-free whipped topping. Carbohydrate calculation 1. Identify the foods that contain carbohydrates: ? Rice. ? Corn. ? Milk. ? Strawberries. 2. Calculate how many  servings you have of each food: ? 2 servings rice. ? 1 serving corn. ? 1 serving milk. ? 1 serving strawberries. 3. Multiply each number of servings by 15 g: ? 2 servings rice x 15 g = 30 g. ? 1 serving corn x 15 g = 15 g. ? 1 serving milk x 15 g = 15 g. ? 1 serving strawberries x 15 g = 15 g. 4. Add together all of the amounts to find the total grams of carbohydrates eaten: ? 30 g + 15 g + 15 g + 15 g = 75 g of carbohydrates total. This information is not intended to replace advice given to you by your health care provider. Make sure you discuss any questions you have with your health care provider. Document Released: 03/30/2005 Document Revised: 10/18/2015 Document Reviewed: 09/11/2015 Elsevier Interactive Patient Education  2018 [employer].  Sinusitis, Adult Sinusitis is soreness and inflammation of your sinuses. Sinuses are hollow spaces in the bones around your face. They are located:  Around your eyes.  In the middle of your forehead.  Behind your nose.  In your cheekbones.  Your sinuses and nasal passages are lined with a stringy fluid (mucus). Mucus normally drains out of your sinuses. When your nasal tissues get inflamed or swollen, the mucus can get trapped or blocked so air cannot flow through your sinuses. This lets bacteria, viruses, and funguses grow, and that leads to infection. Follow these instructions at home: Medicines  Take, use, or apply over-the-counter and prescription medicines only as told by your doctor. These may include nasal sprays.  If you were prescribed an antibiotic medicine, take it as told by your doctor. Do not stop taking the antibiotic even if you start to feel better. Hydrate and Humidify  Drink enough water to keep your pee (urine) clear or pale yellow.  Use a cool mist humidifier to keep the humidity level in your home above 50%.  Breathe in steam for 10-15 minutes, 3-4 times a day or as told by your doctor. You can do this in the  bathroom while a hot shower is running.  Try not to spend time in cool or dry air. Rest  Rest as much as possible.  Sleep with your head raised (elevated).  Make sure to get enough sleep each night. General instructions  Put a warm, moist washcloth on your face 3-4 times a day or as told by your doctor. This will help with discomfort.  Wash your hands often with soap and water. If there is no soap and water, use hand sanitizer.  Do not smoke. Avoid being around people who are smoking (secondhand smoke).  Keep all follow-up visits as told by your doctor. This is important. Contact a doctor if:  You have a fever.  Your symptoms get worse.  Your symptoms do not get better within 10 days. Get help right away if:  You have a very bad headache.  You cannot stop throwing up (vomiting).  You have pain or swelling around your face or eyes.  You have trouble seeing.  You feel confused.  Your neck is stiff.  You have trouble breathing. This information is not intended to replace advice given to you by your health care provider. Make sure you discuss any questions you have with your health care provider. Document Released: 09/16/2007 Document Revised: 11/24/2015 Document Reviewed: 01/23/2015 Elsevier Interactive Patient Education  [employer].

## 2017-03-02 NOTE — Assessment & Plan Note (Signed)
hgba1c acceptable, minimize simple carbs. Increase exercise as tolerated. Continue current meds 

## 2017-03-02 NOTE — Assessment & Plan Note (Signed)
Cefdinir 300 mg po bid, Mucinex bid, probiotic, elderberry

## 2017-03-08 ENCOUNTER — Ambulatory Visit: Payer: Self-pay | Admitting: *Deleted

## 2017-03-08 ENCOUNTER — Telehealth: Payer: Self-pay | Admitting: Family Medicine

## 2017-03-08 NOTE — Telephone Encounter (Signed)
Copied from [place] [id number]. Topic: Inquiry >> Mar 08, 2017  5:31 PM Oliver Pila B wrote: Reason for CRM: pt called and stated that the omnicef and tessalon isnt working for her and is needing another Rx called in for her, one of the meds is actually making her vomit, contact pt when possible, or have pharmacy contact pt when a new Rx is ready

## 2017-03-08 NOTE — Telephone Encounter (Signed)
See previous CRM encounter from earlier today.

## 2017-03-08 NOTE — Telephone Encounter (Signed)
I would have her stop the meds and see if she improves, hydrate with gatorade and ginger ale, maintain clear fluids x 24 hours then progress to [employer] (bananas, rice applesauce toast) and if worsens seek urgent care

## 2017-03-08 NOTE — Telephone Encounter (Signed)
   Reason for Disposition . Vomiting a prescription medication  Answer Assessment - Initial Assessment Questions 1. VOMITING SEVERITY: "How many times have you vomited in the past 24 hours?"     - MILD:  1 - 2 times/day    - MODERATE: 3 - 5 times/day, decreased oral intake without significant weight loss or symptoms of dehydration    - SEVERE: 6 or more times/day, vomits everything or nearly everything, with significant weight loss, symptoms of dehydration    Mild- 2/day 2. ONSET: "When did the vomiting begin?"      On Nov. 20th 3. FLUIDS: "What fluids or food have you vomited up today?" "Have you been able to keep any fluids down?"     Food, pt is trying to hold down fluid 4. ABDOMINAL PAIN: "Are your having any abdominal pain?" If yes : "How bad is it and what does it feel like?" (e.g., crampy, dull, intermittent, constant)      No 5. DIARRHEA: "Is there any diarrhea?" If so, ask: "How many times today?"      Yes 6. CONTACTS: "Is there anyone else in the family with the same symptoms?"      Has been around children within the last week 7. CAUSE: "What do you think is causing your vomiting?"     Possibly medications prescribed recently, omnicef and tessalon pearls 8. HYDRATION STATUS: "Any signs of dehydration?" (e.g., dry mouth [not only dry lips], too weak to stand) "When did you last urinate?"     No 9. OTHER SYMPTOMS: "Do you have any other symptoms?" (e.g., fever, headache, vertigo, vomiting blood or coffee grounds, recent head injury)     No  Protocols used: VOMITING-A-AH   Pt has been having nausea, vomiting and diarrhea since being prescribed Omnicef and Tessalon Pearls. Pt unsure of which medication is causing these symptoms. Pt would like to see Dr. Charlett Blake, but earliest appt is Dec. 3. If pt has to be prescribed another abx, pt states she would not like to take Doxycycline due to it causing yeast infections. Pt would also prefer to have medications sent to [facility] on [address].

## 2017-03-09 ENCOUNTER — Telehealth: Payer: Self-pay | Admitting: Family Medicine

## 2017-03-09 MED ORDER — CEFUROXIME AXETIL 500 MG PO TABS: 500.0000 mg | ORAL_TABLET | Freq: Two times a day (BID) | ORAL | 0 refills | Status: DC

## 2017-03-09 NOTE — Telephone Encounter (Signed)
Attempted to contact patient regarding CRM # [phone]; Left message on voice mail to contact MDs office

## 2017-03-09 NOTE — Telephone Encounter (Signed)
Patient is calling again, needing to know something. May leave vm on phone

## 2017-03-09 NOTE — Telephone Encounter (Signed)
Addison Naegeli, RN      03/09/17 7:58 AM  Note    Attempted to contact patient regarding CRM # [phone]; Left message on voice mail to contact MDs office

## 2017-03-09 NOTE — Telephone Encounter (Signed)
Left detailed message and medicine sent into [facility]

## 2017-03-09 NOTE — Telephone Encounter (Addendum)
Pt returning you call.  Pt states all she needs is a new rx. Vomiting, diarrhea, and nausea all caused by this medicine.  ( cefdinir (OMNICEF) 300 MG capsule and benzonatate (TESSALON) 100 MG capsule Pt states she has read on the Internet all side effects of these meds and not happy with what she reads.  Pt wants a change in medicine. new abx would be better   [facility] # [address] [place] [phone] (Phone) [phone] (Fax)   Pt is in class and cannot access her phone. Pt states she will call back in an hour to see what instructions have been left. Dr Charlett Blake

## 2017-03-09 NOTE — Addendum Note (Signed)
Addended by: Kem Boroughs D on: 03/09/2017 05:01 PM   Modules accepted: Orders

## 2017-03-09 NOTE — Telephone Encounter (Signed)
ceftin 500 mg bid x 10 days -

## 2017-03-10 NOTE — Telephone Encounter (Signed)
Left message to return my call in error. After further review of chart there are 2 other encounters from 11/26 and 11/27 and this has already been addressed.

## 2017-03-11 ENCOUNTER — Ambulatory Visit: Payer: Medicare Other | Admitting: Allergy & Immunology

## 2017-03-16 NOTE — Telephone Encounter (Signed)
Pt called stating that her abx was not at [facility]. NT called [facility] and spoke with [place] who stated the med has been filled and ready to pick up. Pt called and informed.

## 2017-03-16 NOTE — Telephone Encounter (Signed)
Called patient phone went straight to vmail  Left message for patient to call the office back

## 2017-03-24 ENCOUNTER — Other Ambulatory Visit: Payer: Self-pay | Admitting: Family Medicine

## 2017-03-26 ENCOUNTER — Other Ambulatory Visit (INDEPENDENT_AMBULATORY_CARE_PROVIDER_SITE_OTHER): Payer: Medicare Other

## 2017-03-26 DIAGNOSIS — E1169 Type 2 diabetes mellitus with other specified complication: Secondary | ICD-10-CM | POA: Diagnosis not present

## 2017-03-26 DIAGNOSIS — K219 Gastro-esophageal reflux disease without esophagitis: Secondary | ICD-10-CM | POA: Diagnosis not present

## 2017-03-26 DIAGNOSIS — E782 Mixed hyperlipidemia: Secondary | ICD-10-CM | POA: Diagnosis not present

## 2017-03-26 DIAGNOSIS — E669 Obesity, unspecified: Secondary | ICD-10-CM | POA: Diagnosis not present

## 2017-03-26 LAB — COMPREHENSIVE METABOLIC PANEL
ALK PHOS: 62 U/L (ref 39–117)
ALT: 11 U/L (ref 0–35)
AST: 14 U/L (ref 0–37)
Albumin: 4.1 g/dL (ref 3.5–5.2)
BILIRUBIN TOTAL: 0.5 mg/dL (ref 0.2–1.2)
BUN: 14 mg/dL (ref 6–23)
CO2: 30 mEq/L (ref 19–32)
CREATININE: 0.76 mg/dL (ref 0.40–1.20)
Calcium: 9.5 mg/dL (ref 8.4–10.5)
Chloride: 103 mEq/L (ref 96–112)
GFR: 95.72 mL/min (ref >60.00)
GLUCOSE: 111 mg/dL — AB (ref 70–99)
POTASSIUM: 4.7 meq/L (ref 3.5–5.1)
SODIUM: 139 meq/L (ref 135–145)
TOTAL PROTEIN: 6.7 g/dL (ref 6.0–8.3)

## 2017-03-26 LAB — CBC
HCT: 43 % (ref 36.0–46.0)
HEMOGLOBIN: 14.1 g/dL (ref 12.0–15.0)
MCHC: 32.7 g/dL (ref 30.0–36.0)
MCV: 90.5 fl (ref 78.0–100.0)
Platelets: 207 10*3/uL (ref 150.0–400.0)
RBC: 4.75 Mil/uL (ref 3.87–5.11)
RDW: 14.3 % (ref 11.5–15.5)
WBC: 6.2 10*3/uL (ref 4.0–10.5)

## 2017-03-26 LAB — LIPID PANEL
CHOLESTEROL: 212 mg/dL — AB (ref 0–200)
HDL: 60.3 mg/dL (ref >39.00)
LDL CALC: 115 mg/dL — AB (ref 0–99)
NonHDL: 151.35
TRIGLYCERIDES: 183 mg/dL — AB (ref 0.0–149.0)
Total CHOL/HDL Ratio: 4
VLDL: 36.6 mg/dL (ref 0.0–40.0)

## 2017-03-26 LAB — TSH: TSH: 1.6 u[IU]/mL (ref 0.35–4.50)

## 2017-03-26 LAB — HEMOGLOBIN A1C: Hgb A1c MFr Bld: 6.9 % — ABNORMAL HIGH (ref 4.6–6.5)

## 2017-04-01 ENCOUNTER — Encounter: Payer: Self-pay | Admitting: Family Medicine

## 2017-04-01 ENCOUNTER — Ambulatory Visit: Payer: Medicare Other | Admitting: Family Medicine

## 2017-04-01 VITALS — BP 142/78 | HR 77 | Temp 98.5°F | Resp 18 | Wt 196.0 lb

## 2017-04-01 DIAGNOSIS — E669 Obesity, unspecified: Secondary | ICD-10-CM | POA: Diagnosis not present

## 2017-04-01 DIAGNOSIS — K429 Umbilical hernia without obstruction or gangrene: Secondary | ICD-10-CM

## 2017-04-01 DIAGNOSIS — K219 Gastro-esophageal reflux disease without esophagitis: Secondary | ICD-10-CM

## 2017-04-01 DIAGNOSIS — R1900 Intra-abdominal and pelvic swelling, mass and lump, unspecified site: Secondary | ICD-10-CM

## 2017-04-01 DIAGNOSIS — E782 Mixed hyperlipidemia: Secondary | ICD-10-CM | POA: Diagnosis not present

## 2017-04-01 DIAGNOSIS — E1169 Type 2 diabetes mellitus with other specified complication: Secondary | ICD-10-CM

## 2017-04-01 NOTE — Patient Instructions (Signed)
Cholesterol Cholesterol is a fat. Your body needs a small amount of cholesterol. Cholesterol (plaque) may build up in your blood vessels (arteries). That makes you more likely to have a heart attack or stroke. You cannot feel your cholesterol level. Having a blood test is the only way to find out if your level is high. Keep your test results. Work with your doctor to keep your cholesterol at a good level. What do the results mean?  Total cholesterol is how much cholesterol is in your blood.  LDL is bad cholesterol. This is the type that can build up. Try to have low LDL.  HDL is good cholesterol. It cleans your blood vessels and carries LDL away. Try to have high HDL.  Triglycerides are fat that the body can store or burn for energy. What are good levels of cholesterol?  Total cholesterol below 200.  LDL below 100 is good for people who have health risks. LDL below 70 is good for people who have very high risks.  HDL above 40 is good. It is best to have HDL of 60 or higher.  Triglycerides below 150. How can I lower my cholesterol? Diet Follow your diet program as told by your doctor.  Choose fish, white meat chicken, or turkey that is roasted or baked. Try not to eat red meat, fried foods, sausage, or lunch meats.  Eat lots of fresh fruits and vegetables.  Choose whole grains, beans, pasta, potatoes, and cereals.  Choose olive oil, corn oil, or canola oil. Only use small amounts.  Try not to eat butter, mayonnaise, shortening, or palm kernel oils.  Try not to eat foods with trans fats.  Choose low-fat or nonfat dairy foods. ? Drink skim or nonfat milk. ? Eat low-fat or nonfat yogurt and cheeses. ? Try not to drink whole milk or cream. ? Try not to eat ice cream, egg yolks, or full-fat cheeses.  Healthy desserts include angel food cake, ginger snaps, animal crackers, hard candy, popsicles, and low-fat or nonfat frozen yogurt. Try not to eat pastries, cakes, pies, and  cookies.  Exercise Follow your exercise program as told by your doctor.  Be more active. Try gardening, walking, and taking the stairs.  Ask your doctor about ways that you can be more active.  Medicine  Take over-the-counter and prescription medicines only as told by your doctor. This information is not intended to replace advice given to you by your health care provider. Make sure you discuss any questions you have with your health care provider. Document Released: 06/26/2008 Document Revised: 10/30/2015 Document Reviewed: 10/10/2015 Elsevier Interactive Patient Education  2018 Elsevier Inc.  

## 2017-04-01 NOTE — Progress Notes (Signed)
Subjective:  I acted as a Education administrator for Dr. Charlett Blake. [place], [place]  Patient ID: Vicki Dennis, female    DOB: 12/24/1945, 71 y.o.   MRN: [id number]  No chief complaint on file.   HPI  Patient is in today for 6 month follow up and is overall doing well. No recent febrile illness or hospitalizations but she is noting persistent sense of swelling in abdomen with some discomfort most notably in the right upper quadrant. No change in bowel habits or bloody or tarry stool. No anorexia, nausea or vomiting. Denies CP/palp/SOB/HA/congestion/fevers/GI or GU c/o. Taking meds as prescribed  Patient Care Team: Mosie Lukes, MD as PCP - General (Family Medicine) Druscilla Brownie, MD as Consulting Physician (Dermatology) Juanita Craver, MD as Consulting Physician (Gastroenterology)   Past Medical History:  Diagnosis Date  . Allergy   . Anemia    h/o  . Asthma   . Asthma, mild intermittent, well-controlled 02/10/2014  . Blood transfusion without reported diagnosis   . Chicken pox as a child  . Diabetes mellitus    type-II, per Dr. Criss Rosales, no meds. , pt. reports weight loss has contributed  to control of diabetes.  She reports last HgbA1c- wnl.  . Diabetes mellitus 11/05/2010  . Diabetes mellitus type 2 in obese ([place]) 11/05/2010  . Diabetes mellitus type 2, uncontrolled ([place]) 11/05/2010  . Esophageal reflux 02/10/2014  . GERD (gastroesophageal reflux disease)    rare use of OTC aid  . Hyperglycemia 09/02/2015  . Hyperlipidemia   . Hypertension   . Measles as a child  . Medicare annual wellness visit, subsequent 08/06/2014  . Mumps as a child  . Obesity 02/10/2014  . Pneumonia    as a child  . Portal vein thrombosis 03/09/2011   coumadin ended summer of 2013  . Preventative health care 08/12/2014  . Rash and nonspecific skin eruption 01/07/2015  . Seborrheic keratosis 02/10/2014   Follows with dermatology  . Sinusitis, acute 03/23/2016    Past Surgical History:  Procedure Laterality Date  .  BREAST SURGERY     benign - cysts  . INSERTION OF MESH  03/16/2012   Procedure: INSERTION OF MESH;  Surgeon: Harl Bowie, MD;  Location: [facility];  Service: General;  Laterality: N/A;  . KNEE ARTHROSCOPY W/ MENISCAL REPAIR     both knees  . TONSILLECTOMY    . UMBILICAL HERNIA REPAIR  03/16/2012   Procedure: HERNIA REPAIR UMBILICAL ADULT;  Surgeon: Harl Bowie, MD;  Location: [facility];  Service: General;  Laterality: N/A;    Family History  Problem Relation Age of Onset  . Cancer Father        prostate  . Aneurysm Mother 44       brain  . Cancer Maternal Aunt     Social History   Socioeconomic History  . Marital status: Divorced    Spouse name: Not on file  . Number of children: Not on file  . Years of education: Not on file  . Highest education level: Not on file  Social Needs  . Financial resource strain: Not on file  . Food insecurity - worry: Not on file  . Food insecurity - inability: Not on file  . Transportation needs - medical: Not on file  . Transportation needs - non-medical: Not on file  Occupational History  . Not on file  Tobacco Use  . Smoking status: Never Smoker  . Smokeless tobacco: Never Used  . Tobacco comment: never  Subjective:  I acted as a Education administrator for Dr. Charlett Blake. [place], [place]  Patient ID: Vicki Dennis, female    DOB: 12/24/1945, 71 y.o.   MRN: [id number]  No chief complaint on file.   HPI  Patient is in today for 6 month follow up and is overall doing well. No recent febrile illness or hospitalizations but she is noting persistent sense of swelling in abdomen with some discomfort most notably in the right upper quadrant. No change in bowel habits or bloody or tarry stool. No anorexia, nausea or vomiting. Denies CP/palp/SOB/HA/congestion/fevers/GI or GU c/o. Taking meds as prescribed  Patient Care Team: Mosie Lukes, MD as PCP - General (Family Medicine) Druscilla Brownie, MD as Consulting Physician (Dermatology) Juanita Craver, MD as Consulting Physician (Gastroenterology)   Past Medical History:  Diagnosis Date  . Allergy   . Anemia    h/o  . Asthma   . Asthma, mild intermittent, well-controlled 02/10/2014  . Blood transfusion without reported diagnosis   . Chicken pox as a child  . Diabetes mellitus    type-II, per Dr. Criss Rosales, no meds. , pt. reports weight loss has contributed  to control of diabetes.  She reports last HgbA1c- wnl.  . Diabetes mellitus 11/05/2010  . Diabetes mellitus type 2 in obese ([place]) 11/05/2010  . Diabetes mellitus type 2, uncontrolled ([place]) 11/05/2010  . Esophageal reflux 02/10/2014  . GERD (gastroesophageal reflux disease)    rare use of OTC aid  . Hyperglycemia 09/02/2015  . Hyperlipidemia   . Hypertension   . Measles as a child  . Medicare annual wellness visit, subsequent 08/06/2014  . Mumps as a child  . Obesity 02/10/2014  . Pneumonia    as a child  . Portal vein thrombosis 03/09/2011   coumadin ended summer of 2013  . Preventative health care 08/12/2014  . Rash and nonspecific skin eruption 01/07/2015  . Seborrheic keratosis 02/10/2014   Follows with dermatology  . Sinusitis, acute 03/23/2016    Past Surgical History:  Procedure Laterality Date  .  BREAST SURGERY     benign - cysts  . INSERTION OF MESH  03/16/2012   Procedure: INSERTION OF MESH;  Surgeon: Harl Bowie, MD;  Location: [facility];  Service: General;  Laterality: N/A;  . KNEE ARTHROSCOPY W/ MENISCAL REPAIR     both knees  . TONSILLECTOMY    . UMBILICAL HERNIA REPAIR  03/16/2012   Procedure: HERNIA REPAIR UMBILICAL ADULT;  Surgeon: Harl Bowie, MD;  Location: [facility];  Service: General;  Laterality: N/A;    Family History  Problem Relation Age of Onset  . Cancer Father        prostate  . Aneurysm Mother 44       brain  . Cancer Maternal Aunt     Social History   Socioeconomic History  . Marital status: Divorced    Spouse name: Not on file  . Number of children: Not on file  . Years of education: Not on file  . Highest education level: Not on file  Social Needs  . Financial resource strain: Not on file  . Food insecurity - worry: Not on file  . Food insecurity - inability: Not on file  . Transportation needs - medical: Not on file  . Transportation needs - non-medical: Not on file  Occupational History  . Not on file  Tobacco Use  . Smoking status: Never Smoker  . Smokeless tobacco: Never Used  . Tobacco comment: never  Subjective:  I acted as a Education administrator for Dr. Charlett Blake. [place], [place]  Patient ID: Vicki Dennis, female    DOB: 12/24/1945, 71 y.o.   MRN: [id number]  No chief complaint on file.   HPI  Patient is in today for 6 month follow up and is overall doing well. No recent febrile illness or hospitalizations but she is noting persistent sense of swelling in abdomen with some discomfort most notably in the right upper quadrant. No change in bowel habits or bloody or tarry stool. No anorexia, nausea or vomiting. Denies CP/palp/SOB/HA/congestion/fevers/GI or GU c/o. Taking meds as prescribed  Patient Care Team: Mosie Lukes, MD as PCP - General (Family Medicine) Druscilla Brownie, MD as Consulting Physician (Dermatology) Juanita Craver, MD as Consulting Physician (Gastroenterology)   Past Medical History:  Diagnosis Date  . Allergy   . Anemia    h/o  . Asthma   . Asthma, mild intermittent, well-controlled 02/10/2014  . Blood transfusion without reported diagnosis   . Chicken pox as a child  . Diabetes mellitus    type-II, per Dr. Criss Rosales, no meds. , pt. reports weight loss has contributed  to control of diabetes.  She reports last HgbA1c- wnl.  . Diabetes mellitus 11/05/2010  . Diabetes mellitus type 2 in obese ([place]) 11/05/2010  . Diabetes mellitus type 2, uncontrolled ([place]) 11/05/2010  . Esophageal reflux 02/10/2014  . GERD (gastroesophageal reflux disease)    rare use of OTC aid  . Hyperglycemia 09/02/2015  . Hyperlipidemia   . Hypertension   . Measles as a child  . Medicare annual wellness visit, subsequent 08/06/2014  . Mumps as a child  . Obesity 02/10/2014  . Pneumonia    as a child  . Portal vein thrombosis 03/09/2011   coumadin ended summer of 2013  . Preventative health care 08/12/2014  . Rash and nonspecific skin eruption 01/07/2015  . Seborrheic keratosis 02/10/2014   Follows with dermatology  . Sinusitis, acute 03/23/2016    Past Surgical History:  Procedure Laterality Date  .  BREAST SURGERY     benign - cysts  . INSERTION OF MESH  03/16/2012   Procedure: INSERTION OF MESH;  Surgeon: Harl Bowie, MD;  Location: [facility];  Service: General;  Laterality: N/A;  . KNEE ARTHROSCOPY W/ MENISCAL REPAIR     both knees  . TONSILLECTOMY    . UMBILICAL HERNIA REPAIR  03/16/2012   Procedure: HERNIA REPAIR UMBILICAL ADULT;  Surgeon: Harl Bowie, MD;  Location: [facility];  Service: General;  Laterality: N/A;    Family History  Problem Relation Age of Onset  . Cancer Father        prostate  . Aneurysm Mother 44       brain  . Cancer Maternal Aunt     Social History   Socioeconomic History  . Marital status: Divorced    Spouse name: Not on file  . Number of children: Not on file  . Years of education: Not on file  . Highest education level: Not on file  Social Needs  . Financial resource strain: Not on file  . Food insecurity - worry: Not on file  . Food insecurity - inability: Not on file  . Transportation needs - medical: Not on file  . Transportation needs - non-medical: Not on file  Occupational History  . Not on file  Tobacco Use  . Smoking status: Never Smoker  . Smokeless tobacco: Never Used  . Tobacco comment: never  Subjective:  I acted as a Education administrator for Dr. Charlett Blake. [place], [place]  Patient ID: Vicki Dennis, female    DOB: 12/24/1945, 71 y.o.   MRN: [id number]  No chief complaint on file.   HPI  Patient is in today for 6 month follow up and is overall doing well. No recent febrile illness or hospitalizations but she is noting persistent sense of swelling in abdomen with some discomfort most notably in the right upper quadrant. No change in bowel habits or bloody or tarry stool. No anorexia, nausea or vomiting. Denies CP/palp/SOB/HA/congestion/fevers/GI or GU c/o. Taking meds as prescribed  Patient Care Team: Mosie Lukes, MD as PCP - General (Family Medicine) Druscilla Brownie, MD as Consulting Physician (Dermatology) Juanita Craver, MD as Consulting Physician (Gastroenterology)   Past Medical History:  Diagnosis Date  . Allergy   . Anemia    h/o  . Asthma   . Asthma, mild intermittent, well-controlled 02/10/2014  . Blood transfusion without reported diagnosis   . Chicken pox as a child  . Diabetes mellitus    type-II, per Dr. Criss Rosales, no meds. , pt. reports weight loss has contributed  to control of diabetes.  She reports last HgbA1c- wnl.  . Diabetes mellitus 11/05/2010  . Diabetes mellitus type 2 in obese ([place]) 11/05/2010  . Diabetes mellitus type 2, uncontrolled ([place]) 11/05/2010  . Esophageal reflux 02/10/2014  . GERD (gastroesophageal reflux disease)    rare use of OTC aid  . Hyperglycemia 09/02/2015  . Hyperlipidemia   . Hypertension   . Measles as a child  . Medicare annual wellness visit, subsequent 08/06/2014  . Mumps as a child  . Obesity 02/10/2014  . Pneumonia    as a child  . Portal vein thrombosis 03/09/2011   coumadin ended summer of 2013  . Preventative health care 08/12/2014  . Rash and nonspecific skin eruption 01/07/2015  . Seborrheic keratosis 02/10/2014   Follows with dermatology  . Sinusitis, acute 03/23/2016    Past Surgical History:  Procedure Laterality Date  .  BREAST SURGERY     benign - cysts  . INSERTION OF MESH  03/16/2012   Procedure: INSERTION OF MESH;  Surgeon: Harl Bowie, MD;  Location: [facility];  Service: General;  Laterality: N/A;  . KNEE ARTHROSCOPY W/ MENISCAL REPAIR     both knees  . TONSILLECTOMY    . UMBILICAL HERNIA REPAIR  03/16/2012   Procedure: HERNIA REPAIR UMBILICAL ADULT;  Surgeon: Harl Bowie, MD;  Location: [facility];  Service: General;  Laterality: N/A;    Family History  Problem Relation Age of Onset  . Cancer Father        prostate  . Aneurysm Mother 44       brain  . Cancer Maternal Aunt     Social History   Socioeconomic History  . Marital status: Divorced    Spouse name: Not on file  . Number of children: Not on file  . Years of education: Not on file  . Highest education level: Not on file  Social Needs  . Financial resource strain: Not on file  . Food insecurity - worry: Not on file  . Food insecurity - inability: Not on file  . Transportation needs - medical: Not on file  . Transportation needs - non-medical: Not on file  Occupational History  . Not on file  Tobacco Use  . Smoking status: Never Smoker  . Smokeless tobacco: Never Used  . Tobacco comment: never

## 2017-04-01 NOTE — Assessment & Plan Note (Signed)
hgba1c acceptable, minimize simple carbs. Increase exercise as tolerated. Continue current meds 

## 2017-04-01 NOTE — Assessment & Plan Note (Signed)
Avoid offending foods, start probiotics. Do not eat large meals in late evening and consider raising head of bed.  

## 2017-04-01 NOTE — Assessment & Plan Note (Signed)
Encouraged heart healthy diet, increase exercise, avoid trans fats, consider a krill oil cap daily 

## 2017-04-08 ENCOUNTER — Telehealth: Payer: Self-pay | Admitting: Family Medicine

## 2017-04-08 ENCOUNTER — Ambulatory Visit (HOSPITAL_BASED_OUTPATIENT_CLINIC_OR_DEPARTMENT_OTHER)
Admission: RE | Admit: 2017-04-08 | Discharge: 2017-04-08 | Disposition: A | Discharge status: Home / Self Care | Payer: Medicare Other | Source: Ambulatory Visit | Attending: Family Medicine | Admitting: Family Medicine

## 2017-04-08 DIAGNOSIS — N289 Disorder of kidney and ureter, unspecified: Secondary | ICD-10-CM | POA: Diagnosis not present

## 2017-04-08 DIAGNOSIS — Q6102 Congenital multiple renal cysts: Secondary | ICD-10-CM | POA: Diagnosis not present

## 2017-04-08 DIAGNOSIS — R932 Abnormal findings on diagnostic imaging of liver and biliary tract: Secondary | ICD-10-CM | POA: Insufficient documentation

## 2017-04-08 DIAGNOSIS — K429 Umbilical hernia without obstruction or gangrene: Secondary | ICD-10-CM | POA: Insufficient documentation

## 2017-04-08 DIAGNOSIS — K8020 Calculus of gallbladder without cholecystitis without obstruction: Secondary | ICD-10-CM | POA: Insufficient documentation

## 2017-04-08 DIAGNOSIS — R1900 Intra-abdominal and pelvic swelling, mass and lump, unspecified site: Secondary | ICD-10-CM | POA: Diagnosis not present

## 2017-04-08 NOTE — Telephone Encounter (Signed)
Patient walked in and would like lab results to be mailed to her. Patient stts she had labs done a week or two ago. If any questions please call [phone].

## 2017-04-08 NOTE — Telephone Encounter (Signed)
Notes recorded by Magdalene Molly, RMA on 04/01/2017 at 7:27 AM EST Letter mailed to patient ------  Notes recorded by Mosie Lukes, MD on 03/28/2017 at 3:55 PM EST Notify labs look good mostly. Sugar improved. Only concer is cholesterol is up some. Maintain heart healthy diet, increase exercise, avoid trans fats, consider a krill oil cap daily    Per above, results have already been placed in mail to her.

## 2017-04-09 DIAGNOSIS — R1900 Intra-abdominal and pelvic swelling, mass and lump, unspecified site: Secondary | ICD-10-CM | POA: Insufficient documentation

## 2017-04-09 NOTE — Assessment & Plan Note (Signed)
Ultrasound confirms recurrence of small periumbilical hernias containing fat but no bowel. Will refer to general surgery if pain worsens.

## 2017-04-09 NOTE — Assessment & Plan Note (Signed)
Abdominal ultrasound shows numerous findings none of which appear to be the cause of her discomfort but if pain persists or worsens will need referral to general surgery and/or urology for further consideration. [place] results: Small periumbilical hernias containing fat but no bowel.  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 There also simple appearing cysts in both kidneys.  Gallstones without sonographic evidence of acute cholecystitis. Probable right lobe liver hemangioma. Normal appearing portal vein.

## 2017-04-21 NOTE — Telephone Encounter (Addendum)
Patient returned call for results, lab results given per note Dr. Charlett Blake 03/28/17 and US Abdomen Complete results noted by Dr. Charlett Blake 04/09/17, patient verbalized understanding of both results. Unable to chart in result notes due to result note not routed to [facility].

## 2017-04-24 ENCOUNTER — Other Ambulatory Visit: Payer: Self-pay | Admitting: Allergy & Immunology

## 2017-04-26 NOTE — Telephone Encounter (Signed)
Courtesy refill  

## 2017-06-17 ENCOUNTER — Telehealth: Payer: Self-pay | Admitting: *Deleted

## 2017-06-17 ENCOUNTER — Ambulatory Visit: Payer: Medicare Other | Admitting: Allergy & Immunology

## 2017-06-17 ENCOUNTER — Encounter: Payer: Self-pay | Admitting: Allergy & Immunology

## 2017-06-17 VITALS — BP 140/84 | HR 88 | Resp 20 | Ht 66.0 in

## 2017-06-17 DIAGNOSIS — K219 Gastro-esophageal reflux disease without esophagitis: Secondary | ICD-10-CM

## 2017-06-17 DIAGNOSIS — J302 Other seasonal allergic rhinitis: Secondary | ICD-10-CM | POA: Insufficient documentation

## 2017-06-17 DIAGNOSIS — J3089 Other allergic rhinitis: Secondary | ICD-10-CM | POA: Diagnosis not present

## 2017-06-17 DIAGNOSIS — J4540 Moderate persistent asthma, uncomplicated: Secondary | ICD-10-CM | POA: Insufficient documentation

## 2017-06-17 MED ORDER — ALBUTEROL SULFATE HFA 108 (90 BASE) MCG/ACT IN AERS: 2.0000 | INHALATION_SPRAY | RESPIRATORY_TRACT | 1 refills | Status: DC | PRN

## 2017-06-17 MED ORDER — FLUTICASONE FUROATE-VILANTEROL 100-25 MCG/INH IN AEPB: INHALATION_SPRAY | RESPIRATORY_TRACT | 5 refills | Status: DC

## 2017-06-17 NOTE — Patient Instructions (Addendum)
1. Moderate persistent asthma, uncomplicated - Lung testing looks great today. - We will not make any medication changes at this time.  - Daily controller medication(s): Breo 100/25 one puff once daily + montelukast 10mg  daily - Rescue medications: albuterol 4 puffs every 4-6 hours as needed - Asthma control goals:  * Full participation in all desired activities (may need albuterol before activity) * Albuterol use two time or less a week on average (not counting use with activity) * Cough interfering with sleep two time or less a month * Oral steroids no more than once a year * No hospitalizations  2. Chronic rhinitis  - Continue with your nasal spray 1-2 sprays per nostril daily. - Continue with Zyrtec (cetirizine) 10mg  daily.  3. Gastroesophageal reflux disease - Continue with Zantac (ranitidine) 75mg  daily. - It seems that the dietary changes are helping quite a bit as well.   4. Return in about 6 months (around 12/18/2017).    Please inform us of any Emergency Department visits, hospitalizations, or changes in symptoms. Call us before going to the ED for breathing or allergy symptoms since we might be able to fit you in for a sick visit. Feel free to contact us anytime with any questions, problems, or concerns.  It was a pleasure to see you again today!  Websites that have reliable patient information: 1. American Academy of Asthma, Allergy, and Immunology: www.aaaai.org 2. Food Allergy Research and Education (FARE): foodallergy.org 3. Mothers of Asthmatics: http://www.asthmacommunitynetwork.org 4. American College of Allergy, Asthma, and Immunology: www.acaai.org

## 2017-06-17 NOTE — Progress Notes (Signed)
FOLLOW UP  Date of Service/Encounter:  06/17/17   Assessment:   Moderate persistent asthma without complication  Seasonal and perennial allergic rhinitis (grasses, weeds, molds, dust mites)  Gastroesophageal reflux disease   Asthma Reportables:  Severity: moderate persistent  Risk: low Control: well controlled  Plan/Recommendations:   1. Moderate persistent asthma, uncomplicated - Lung testing looks great today. - We will not make any medication changes at this time.  - Daily controller medication(s): Breo 100/25 one puff once daily + montelukast 10mg  daily - Rescue medications: albuterol 4 puffs every 4-6 hours as needed - Asthma control goals:  * Full participation in all desired activities (may need albuterol before activity) * Albuterol use two time or less a week on average (not counting use with activity) * Cough interfering with sleep two time or less a month * Oral steroids no more than once a year * No hospitalizations  2. Chronic rhinitis  - Continue with your nasal spray 1-2 sprays per nostril daily. - Continue with Zyrtec (cetirizine) 10mg  daily.  3. Gastroesophageal reflux disease - Continue with Zantac (ranitidine) 75mg  daily. - It seems that the dietary changes are helping quite a bit as well.   4. Return in about 6 months (around 12/18/2017).  Subjective:   Vicki Dennis is a 72 y.o. female presenting today for follow up of  Chief Complaint  Patient presents with  . Asthma    Vicki Dennis has a history of the following: Patient Active Problem List   Diagnosis Date Noted  . Seasonal and perennial allergic rhinitis 06/17/2017  . Moderate persistent asthma without complication 55/73/2202  . Abdominal mass 04/09/2017  . Sinusitis 03/23/2016  . Rash and nonspecific skin eruption 01/07/2015  . Preventative health care 08/12/2014  . Medicare annual wellness visit, subsequent 08/06/2014  . Obesity 02/10/2014  . History of colonic polyps  02/10/2014  . Gastroesophageal reflux disease 02/10/2014  . Seborrheic keratosis 02/10/2014  . Asthma, mild intermittent, well-controlled 02/10/2014  . Chicken pox   . Measles   . Mumps   . Allergy   . Umbilical hernia 54/27/0623  . Portal vein thrombosis 03/09/2011  . Diabetes mellitus type 2 in obese ([place]) 11/05/2010  . Hyperlipidemia 11/05/2010    History obtained from: chart review and patient.  Vicki Dennis Primary Care Provider is Mosie Lukes, MD.     Vicki Dennis is a 72 y.o. female presenting for a follow up visit. She was last seen in May 2018. At that time, we changed her to Breo 100/25 one puff once daily. For her allergic rhinitis, we continued her on Flonase and Zyrtec. We also gave her a steroid injection at that time. We continued her on Zantac 75mg  once daily.    Since the last visit, she has mostly done well. 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 She likes to keep her mind Depaolo and keeps her younger at heart. This seems to keep her brain "ticking". She also enjoys traveling the world with various family members and friends. She particularly enjoys traveling to [place].   She remains on the Breo one puff once daily. This seems to be working well and I was able to get her to buy into the idea of taking it every day. Vicki Dennis's asthma has been well controlled. She has not required rescue medication, experienced nocturnal awakenings due to lower respiratory symptoms, nor have activities of daily living been limited. She has  required no Emergency Department or Urgent Care visits for her asthma. She has required zero courses of systemic steroids for asthma exacerbations since the last visit. ACT score today is 20, indicating excellent asthma symptom control.   Acid reflux is under good control with dietary changes. She is currently taking some vitamins that help to regulate blood sugar. She was recommended to use metformin but  she has resisted this. Similarly, she has pursued other dietary changes to help with her reflux. She remains on ranitidine 75mg  nightly, which she uses only as needed.   Her last environmental allergy testing was performed in May 2008 (below).      Otherwise, there have been no changes to her past medical history, surgical history, family history, or social history.    Review of Systems: a 14-point review of systems is pertinent for what is mentioned in HPI.  Otherwise, all other systems were negative. Constitutional: negative other than that listed in the HPI Eyes: negative other than that listed in the HPI Ears, nose, mouth, throat, and face: negative other than that listed in the HPI Respiratory: negative other than that listed in the HPI Cardiovascular: negative other than that listed in the HPI Gastrointestinal: negative other than that listed in the HPI Genitourinary: negative other than that listed in the HPI Integument: negative other than that listed in the HPI Hematologic: negative other than that listed in the HPI Musculoskeletal: negative other than that listed in the HPI Neurological: negative other than that listed in the HPI Allergy/Immunologic: negative other than that listed in the HPI    Objective:   Blood pressure 140/84, pulse 88, resp. rate 20, height 5\' 6"  (1.676 m). Body mass index is 31.64 kg/m.   Physical Exam:  General: Alert, interactive, in no acute distress. Pleasant and very talkative.  Eyes: No conjunctival injection bilaterally, no discharge on the right, no discharge on the left and no Horner-Trantas dots present. PERRL bilaterally. EOMI without pain. No photophobia.  Ears: Right TM pearly gray with normal light reflex, Left TM pearly gray with normal light reflex, Right TM intact without perforation and Left TM intact without perforation.  Nose/Throat: External nose within normal limits and septum midline. Turbinates edematous and pale with  clear discharge. Posterior oropharynx erythematous with cobblestoning in the posterior oropharynx. Tonsils 2+ without exudates.  Tongue without thrush. Lungs: Clear to auscultation without wheezing, rhonchi or rales. No increased work of breathing. CV: Normal S1/S2. No murmurs. Capillary refill <2 seconds.  Skin: Warm and dry, without lesions or rashes. Neuro:   Grossly intact. No focal deficits appreciated. Responsive to questions.  Diagnostic studies:   Spirometry: results normal (FEV1: 1.92/100%, FVC: 2.28/92%, FEV1/FVC: 84%).    Spirometry consistent with normal pattern.   Allergy Studies: none     Salvatore Marvel, MD [facility]

## 2017-06-17 NOTE — Telephone Encounter (Signed)
error 

## 2017-06-20 ENCOUNTER — Other Ambulatory Visit: Payer: Self-pay | Admitting: Family Medicine

## 2017-06-26 ENCOUNTER — Other Ambulatory Visit: Payer: Self-pay | Admitting: Family Medicine

## 2017-08-09 ENCOUNTER — Ambulatory Visit: Payer: Medicare Other | Admitting: Family Medicine

## 2017-08-13 ENCOUNTER — Ambulatory Visit: Payer: Medicare Other | Admitting: Family Medicine

## 2017-08-13 ENCOUNTER — Encounter: Payer: Self-pay | Admitting: Family Medicine

## 2017-08-13 DIAGNOSIS — E669 Obesity, unspecified: Secondary | ICD-10-CM

## 2017-08-13 DIAGNOSIS — E782 Mixed hyperlipidemia: Secondary | ICD-10-CM

## 2017-08-13 DIAGNOSIS — J3089 Other allergic rhinitis: Secondary | ICD-10-CM | POA: Diagnosis not present

## 2017-08-13 DIAGNOSIS — J302 Other seasonal allergic rhinitis: Secondary | ICD-10-CM

## 2017-08-13 DIAGNOSIS — K219 Gastro-esophageal reflux disease without esophagitis: Secondary | ICD-10-CM | POA: Diagnosis not present

## 2017-08-13 DIAGNOSIS — E1169 Type 2 diabetes mellitus with other specified complication: Secondary | ICD-10-CM

## 2017-08-13 LAB — MICROALBUMIN / CREATININE URINE RATIO
Creatinine,U: 191.2 mg/dL
MICROALB UR: 2.1 mg/dL — AB (ref 0.0–1.9)
Microalb Creat Ratio: 1.1 mg/g (ref 0.0–30.0)

## 2017-08-13 LAB — COMPREHENSIVE METABOLIC PANEL
ALBUMIN: 4.1 g/dL (ref 3.5–5.2)
ALK PHOS: 59 U/L (ref 39–117)
ALT: 18 U/L (ref 0–35)
AST: 17 U/L (ref 0–37)
BUN: 16 mg/dL (ref 6–23)
CALCIUM: 9.6 mg/dL (ref 8.4–10.5)
CO2: 27 mEq/L (ref 19–32)
CREATININE: 0.78 mg/dL (ref 0.40–1.20)
Chloride: 105 mEq/L (ref 96–112)
GFR: 92.79 mL/min (ref >60.00)
Glucose, Bld: 108 mg/dL — ABNORMAL HIGH (ref 70–99)
POTASSIUM: 4.2 meq/L (ref 3.5–5.1)
SODIUM: 140 meq/L (ref 135–145)
Total Bilirubin: 0.5 mg/dL (ref 0.2–1.2)
Total Protein: 6.8 g/dL (ref 6.0–8.3)

## 2017-08-13 LAB — TSH: TSH: 0.81 u[IU]/mL (ref 0.35–4.50)

## 2017-08-13 LAB — LIPID PANEL
Cholesterol: 206 mg/dL — ABNORMAL HIGH (ref 0–200)
HDL: 72.1 mg/dL (ref >39.00)
LDL Cholesterol: 114 mg/dL — ABNORMAL HIGH (ref 0–99)
NonHDL: 133.73
Total CHOL/HDL Ratio: 3
Triglycerides: 97 mg/dL (ref 0.0–149.0)
VLDL: 19.4 mg/dL (ref 0.0–40.0)

## 2017-08-13 LAB — CBC
HEMATOCRIT: 42.6 % (ref 36.0–46.0)
Hemoglobin: 13.9 g/dL (ref 12.0–15.0)
MCHC: 32.6 g/dL (ref 30.0–36.0)
MCV: 89.3 fl (ref 78.0–100.0)
Platelets: 220 10*3/uL (ref 150.0–400.0)
RBC: 4.77 Mil/uL (ref 3.87–5.11)
RDW: 14.2 % (ref 11.5–15.5)
WBC: 5.6 10*3/uL (ref 4.0–10.5)

## 2017-08-13 LAB — HEMOGLOBIN A1C: HEMOGLOBIN A1C: 6.9 % — AB (ref 4.6–6.5)

## 2017-08-13 MED ORDER — ALBUTEROL SULFATE HFA 108 (90 BASE) MCG/ACT IN AERS: 2.0000 | INHALATION_SPRAY | RESPIRATORY_TRACT | 1 refills | Status: DC | PRN

## 2017-08-13 NOTE — Progress Notes (Signed)
Subjective:  I acted as a Neurosurgeon for Dr. Abner Greenspan. [place], [place]  Patient ID: Vicki Dennis, female    DOB: [birth year], 72 y.o.   MRN: [id number]  No chief complaint on file.   HPI  Patient is in today for 4 month follow up and she is doing well. She is hoping to travel to [place] this summer. No recent febrile illness or hospitalization. She continues to teach. Denies CP/palp/SOB/HA/congestion/fevers/GI or GU c/o. Taking meds as prescribed. No polyuria or polydipsia. Has been walking a great deal with her family.  Patient Care Team: Bradd Canary, MD as PCP - General (Family Medicine) Cherlyn Roberts, MD as Consulting Physician (Dermatology) Charna Elizabeth, MD as Consulting Physician (Gastroenterology)   Past Medical History:  Diagnosis Date  . Allergy   . Anemia    h/o  . Asthma   . Asthma, mild intermittent, well-controlled 02/10/2014  . Blood transfusion without reported diagnosis   . Chicken pox as a child  . Diabetes mellitus    type-II, per Dr. Parke Simmers, no meds. , pt. reports weight loss has contributed  to control of diabetes.  She reports last HgbA1c- wnl.  . Diabetes mellitus 11/05/2010  . Diabetes mellitus type 2 in obese ([facility]) 11/05/2010  . Diabetes mellitus type 2, uncontrolled ([facility]) 11/05/2010  . Esophageal reflux 02/10/2014  . GERD (gastroesophageal reflux disease)    rare use of OTC aid  . Hyperglycemia 09/02/2015  . Hyperlipidemia   . Hypertension   . Measles as a child  . Medicare annual wellness visit, subsequent 08/06/2014  . Mumps as a child  . Obesity 02/10/2014  . Pneumonia    as a child  . Portal vein thrombosis 03/09/2011   coumadin ended summer of 2013  . Preventative health care 08/12/2014  . Rash and nonspecific skin eruption 01/07/2015  . Seborrheic keratosis 02/10/2014   Follows with dermatology  . Sinusitis, acute 03/23/2016    Past Surgical History:  Procedure Laterality Date  . BREAST SURGERY     benign - cysts  . INSERTION OF MESH   03/16/2012   Procedure: INSERTION OF MESH;  Surgeon: Shelly Rubenstein, MD;  Location: [facility] OR;  Service: General;  Laterality: N/A;  . KNEE ARTHROSCOPY W/ MENISCAL REPAIR     both knees  . TONSILLECTOMY    . UMBILICAL HERNIA REPAIR  03/16/2012   Procedure: HERNIA REPAIR UMBILICAL ADULT;  Surgeon: Shelly Rubenstein, MD;  Location: [facility] OR;  Service: General;  Laterality: N/A;    Family History  Problem Relation Age of Onset  . Cancer Father        prostate  . Aneurysm Mother 66       brain  . Cancer Maternal Aunt     Social History   Socioeconomic History  . Marital status: Divorced    Spouse name: Not on file  . Number of children: Not on file  . Years of education: Not on file  . Highest education level: Not on file  Occupational History  . Not on file  Social Needs  . Financial resource strain: Not on file  . Food insecurity:    Worry: Not on file    Inability: Not on file  . Transportation needs:    Medical: Not on file    Non-medical: Not on file  Tobacco Use  . Smoking status: Never Smoker  . Smokeless tobacco: Never Used  . Tobacco comment: never used tobacco  Substance and Sexual Activity  .  Alcohol use: Yes    Alcohol/week: 0.0 oz    Comment: for holidays  . Drug use: No  . Sexual activity: Never    Comment: lives alone   Lifestyle  . Physical activity:    Days per week: Not on file    Minutes per session: Not on file  . Stress: Not on file  Relationships  . Social connections:    Talks on phone: Not on file    Gets together: Not on file    Attends religious service: Not on file    Active member of club or organization: Not on file    Attends meetings of clubs or organizations: Not on file    Relationship status: Not on file  . Intimate partner violence:    Fear of current or ex partner: Not on file    Emotionally abused: Not on file    Physically abused: Not on file    Forced sexual activity: Not on file  Other Topics Concern  . Not on file    Social History Narrative  . Not on file    Outpatient Medications Prior to Visit  Medication Sig Dispense Refill  . aspirin 81 MG tablet Take 81 mg by mouth daily.     Marland Kitchen azelastine (ASTELIN) 0.1 % nasal spray Place 2 sprays into both nostrils 2 (two) times daily. Use in each nostril as directed 30 mL 6  . enalapril (VASOTEC) 2.5 MG tablet TAKE 1 TABLET BY MOUTH DAILY 90 tablet 0  . EPIPEN 2-PAK 0.3 MG/0.3ML SOAJ injection Use as directed for severe allergic reactions. 2 Device 1  . fluticasone (FLONASE) 50 MCG/ACT nasal spray Place 2 sprays into both nostrils daily. 16 g 6  . fluticasone furoate-vilanterol (BREO ELLIPTA) 100-25 MCG/INH AEPB INHALE 1 PUFF BY MOUTH ONCE DAILY 28 each 5  . glucose blood (ACCU-CHEK AVIVA PLUS) test strip Use as directed once daily to check blood sugar.  Dx Code: E11.65 100 each 1  . Lancets 30G MISC Use as directed once daily to check blood sugar.  Diagnosis code E11.9 100 each 5  . montelukast (SINGULAIR) 10 MG tablet TAKE 1 TABLET (10 MG TOTAL) BY MOUTH AT BEDTIME. 90 tablet 2  . Multiple Vitamins-Minerals (MULTIVITAMIN WITH MINERALS) tablet Take 1 tablet by mouth daily.      . Omega-3 Fatty Acids (FISH OIL CONCENTRATE) 1000 MG CAPS Take by mouth 3 (three) times daily.    . ranitidine (ZANTAC) 75 MG tablet Take 75 mg by mouth 2 (two) times daily.    Marland Kitchen albuterol (PROVENTIL HFA) 108 (90 Base) MCG/ACT inhaler Inhale 2 puffs into the lungs every 4 (four) hours as needed for wheezing or shortness of breath. 18 g 1   No facility-administered medications prior to visit.     Allergies  Allergen Reactions  . Codeine Other (See Comments)    "makes me crazy"  . Sulfa Antibiotics Anaphylaxis    "died 3 times".  . Oxycodone     Review of Systems  Constitutional: Negative for chills, fever and malaise/fatigue.  HENT: Negative for congestion and hearing loss.   Eyes: Negative for discharge.  Respiratory: Negative for cough, sputum production and shortness of  breath.   Cardiovascular: Negative for chest pain, palpitations and leg swelling.  Gastrointestinal: Negative for abdominal pain, blood in stool, constipation, diarrhea, heartburn, nausea and vomiting.  Genitourinary: Negative for dysuria, frequency, hematuria and urgency.  Musculoskeletal: Negative for back pain, falls and myalgias.  Skin: Negative for rash.  Neurological: Negative for dizziness, sensory change, loss of consciousness, weakness and headaches.  Endo/Heme/Allergies: Negative for environmental allergies. Does not bruise/bleed easily.  Psychiatric/Behavioral: Negative for depression and suicidal ideas. The patient is not nervous/anxious and does not have insomnia.        Objective:    Physical Exam  Constitutional: She is oriented to person, place, and time. No distress.  HENT:  Head: Normocephalic and atraumatic.  Right Ear: External ear normal.  Left Ear: External ear normal.  Nose: Nose normal.  Mouth/Throat: Oropharynx is clear and moist. No oropharyngeal exudate.  Eyes: Pupils are equal, round, and reactive to light. Conjunctivae are normal. Right eye exhibits no discharge. Left eye exhibits no discharge. No scleral icterus.  Neck: Normal range of motion. Neck supple. No thyromegaly present.  Cardiovascular: Normal rate, regular rhythm, normal heart sounds and intact distal pulses.  No murmur heard. Pulmonary/Chest: Effort normal and breath sounds normal. No respiratory distress. She has no wheezes. She has no rales.  Abdominal: Soft. Bowel sounds are normal. She exhibits no distension and no mass. There is no tenderness.  Musculoskeletal: Normal range of motion. She exhibits no edema or tenderness.  Lymphadenopathy:    She has no cervical adenopathy.  Neurological: She is alert and oriented to person, place, and time. She has normal reflexes. She displays normal reflexes. No cranial nerve deficit. Coordination normal.  Skin: Skin is warm and dry. No rash noted. She  is not diaphoretic.    BP 124/80 (BP Location: Left Arm, Patient Position: Sitting, Cuff Size: Normal)   Pulse 69   Temp 97.7 F (36.5 C) (Oral)   Resp 18   Wt 188 lb 3.2 oz (85.4 kg)   SpO2 98%   BMI 30.38 kg/m  Wt Readings from Last 3 Encounters:  08/13/17 188 lb 3.2 oz (85.4 kg)  04/01/17 196 lb (88.9 kg)  03/02/17 196 lb 6.4 oz (89.1 kg)   BP Readings from Last 3 Encounters:  08/13/17 124/80  06/17/17 140/84  04/01/17 (!) 142/78     Immunization History  Administered Date(s) Administered  . Influenza, High Dose Seasonal PF 12/20/2015  . Influenza,inj,Quad PF,6+ Mos 01/07/2015, 12/20/2016  . Influenza-Unspecified 01/16/2014, 12/20/2016  . Pneumococcal Conjugate-13 10/04/2015  . Td 04/13/2005, 12/20/2015  . Typhoid Live 05/31/2017    Health Maintenance  Topic Date Due  . OPHTHALMOLOGY EXAM  05/29/1955  . PNA vac Low Risk Adult (2 of 2 - PPSV23) 10/03/2016  . FOOT EXAM  06/15/2017  . HEMOGLOBIN A1C  09/24/2017  . MAMMOGRAM  10/30/2017  . INFLUENZA VACCINE  11/11/2017  . COLONOSCOPY  03/14/2023  . TETANUS/TDAP  12/19/2025  . DEXA SCAN  Completed  . Hepatitis C Screening  Completed    Lab Results  Component Value Date   WBC 6.2 03/26/2017   HGB 14.1 03/26/2017   HCT 43.0 03/26/2017   PLT 207.0 03/26/2017   GLUCOSE 111 (H) 03/26/2017   CHOL 212 (H) 03/26/2017   TRIG 183.0 (H) 03/26/2017   HDL 60.30 03/26/2017   LDLDIRECT 111.2 02/06/2014   LDLCALC 115 (H) 03/26/2017   ALT 11 03/26/2017   AST 14 03/26/2017   NA 139 03/26/2017   K 4.7 03/26/2017   CL 103 03/26/2017   CREATININE 0.76 03/26/2017   BUN 14 03/26/2017   CO2 30 03/26/2017   TSH 1.60 03/26/2017   INR 2.2 09/21/2011   HGBA1C 6.9 (H) 03/26/2017   MICROALBUR 2.2 (H) 02/06/2014    Lab Results  Component Value Date  TSH 1.60 03/26/2017   Lab Results  Component Value Date   WBC 6.2 03/26/2017   HGB 14.1 03/26/2017   HCT 43.0 03/26/2017   MCV 90.5 03/26/2017   PLT 207.0 03/26/2017    Lab Results  Component Value Date   NA 139 03/26/2017   K 4.7 03/26/2017   CHLORIDE 110 (H) 10/10/2015   CO2 30 03/26/2017   GLUCOSE 111 (H) 03/26/2017   BUN 14 03/26/2017   CREATININE 0.76 03/26/2017   BILITOT 0.5 03/26/2017   ALKPHOS 62 03/26/2017   AST 14 03/26/2017   ALT 11 03/26/2017   PROT 6.7 03/26/2017   ALBUMIN 4.1 03/26/2017   CALCIUM 9.5 03/26/2017   ANIONGAP 8 10/10/2015   EGFR 86 (L) 10/10/2015   GFR 95.72 03/26/2017   Lab Results  Component Value Date   CHOL 212 (H) 03/26/2017   Lab Results  Component Value Date   HDL 60.30 03/26/2017   Lab Results  Component Value Date   LDLCALC 115 (H) 03/26/2017   Lab Results  Component Value Date   TRIG 183.0 (H) 03/26/2017   Lab Results  Component Value Date   CHOLHDL 4 03/26/2017   Lab Results  Component Value Date   HGBA1C 6.9 (H) 03/26/2017         Assessment & Plan:   Problem List Items Addressed This Visit    Diabetes mellitus type 2 in obese ([facility])    hgba1c acceptable, minimize simple carbs. Increase exercise as tolerated. Continue current meds      Relevant Orders   Hemoglobin A1c   Microalbumin / creatinine urine ratio   Hyperlipidemia    Encouraged heart healthy diet, increase exercise, avoid trans fats, consider a krill oil cap daily      Relevant Orders   Lipid panel   Obesity    Encouraged DASH or MIND diet, decrease po intake and increase exercise as tolerated. Needs 7-8 hours of sleep nightly. Avoid trans fats, eat small, frequent meals every 4-5 hours with lean proteins, complex carbs and healthy fats. Minimize simple carbs      Preventative health care    Patient encouraged to maintain heart healthy diet, regular exercise, adequate sleep. Consider daily probiotics. Take medications as prescribed      Relevant Orders   CBC   Comprehensive metabolic panel   TSH      I am having Dois Davenport E. Lerner maintain her multivitamin with minerals, aspirin, FISH OIL CONCENTRATE,  fluticasone, Lancets 30G, azelastine, EPIPEN 2-PAK, montelukast, ranitidine, fluticasone furoate-vilanterol, glucose blood, enalapril, and albuterol.  Meds ordered this encounter  Medications  . albuterol (PROVENTIL HFA) 108 (90 Base) MCG/ACT inhaler    Sig: Inhale 2 puffs into the lungs every 4 (four) hours as needed for wheezing or shortness of breath.    Dispense:  18 g    Refill:  1    CMA served as scribe during this visit. History, Physical and Plan performed by medical provider. Documentation and orders reviewed and attested to.  Danise Edge, MD

## 2017-08-13 NOTE — Assessment & Plan Note (Signed)
Doing well 

## 2017-08-13 NOTE — Assessment & Plan Note (Signed)
Encouraged DASH or MIND diet, decrease po intake and increase exercise as tolerated. Needs 7-8 hours of sleep nightly. Avoid trans fats, eat small, frequent meals every 4-5 hours with lean proteins, complex carbs and healthy fats. Minimize simple carbs

## 2017-08-13 NOTE — Patient Instructions (Signed)
Preventive Care 74 Years and Older, Female Preventive care refers to lifestyle choices and visits with your health care provider that can promote health and wellness. What does preventive care include?  A yearly physical exam. This is also called an annual well check.  Dental exams once or twice a year.  Routine eye exams. Ask your health care provider how often you should have your eyes checked.  Personal lifestyle choices, including: ? Daily care of your teeth and gums. ? Regular physical activity. ? Eating a healthy diet. ? Avoiding tobacco and drug use. ? Limiting alcohol use. ? Practicing safe sex. ? Taking low-dose aspirin every day. ? Taking vitamin and mineral supplements as recommended by your health care provider. What happens during an annual well check? The services and screenings done by your health care provider during your annual well check will depend on your age, overall health, lifestyle risk factors, and family history of disease. Counseling Your health care provider may ask you questions about your:  Alcohol use.  Tobacco use.  Drug use.  Emotional well-being.  Home and relationship well-being.  Sexual activity.  Eating habits.  History of falls.  Memory and ability to understand (cognition).  Work and work environment.  Reproductive health.  Screening You may have the following tests or measurements:  Height, weight, and BMI.  Blood pressure.  Lipid and cholesterol levels. These may be checked every 5 years, or more frequently if you are over 50 years old.  Skin check.  Lung cancer screening. You may have this screening every year starting at age 55 if you have a 30-pack-year history of smoking and currently smoke or have quit within the past 15 years.  Fecal occult blood test (FOBT) of the stool. You may have this test every year starting at age 50.  Flexible sigmoidoscopy or colonoscopy. You may have a sigmoidoscopy every 5 years or  a colonoscopy every 10 years starting at age 50.  Hepatitis C blood test.  Hepatitis B blood test.  Sexually transmitted disease (STD) testing.  Diabetes screening. This is done by checking your blood sugar (glucose) after you have not eaten for a while (fasting). You may have this done every 1-3 years.  Bone density scan. This is done to screen for osteoporosis. You may have this done starting at age 65.  Mammogram. This may be done every 1-2 years. Talk to your health care provider about how often you should have regular mammograms.  Talk with your health care provider about your test results, treatment options, and if necessary, the need for more tests. Vaccines Your health care provider may recommend certain vaccines, such as:  Influenza vaccine. This is recommended every year.  Tetanus, diphtheria, and acellular pertussis (Tdap, Td) vaccine. You may need a Td booster every 10 years.  Varicella vaccine. You may need this if you have not been vaccinated.  Zoster vaccine. You may need this after age 60.  Measles, mumps, and rubella (MMR) vaccine. You may need at least one dose of MMR if you were born in 1957 or later. You may also need a second dose.  Pneumococcal 13-valent conjugate (PCV13) vaccine. One dose is recommended after age 65.  Pneumococcal polysaccharide (PPSV23) vaccine. One dose is recommended after age 65.  Meningococcal vaccine. You may need this if you have certain conditions.  Hepatitis A vaccine. You may need this if you have certain conditions or if you travel or work in places where you may be exposed to hepatitis   A.  Hepatitis B vaccine. You may need this if you have certain conditions or if you travel or work in places where you may be exposed to hepatitis B.  Haemophilus influenzae type b (Hib) vaccine. You may need this if you have certain conditions.  Talk to your health care provider about which screenings and vaccines you need and how often you  need them. This information is not intended to replace advice given to you by your health care provider. Make sure you discuss any questions you have with your health care provider. Document Released: 04/26/2015 Document Revised: 12/18/2015 Document Reviewed: 01/29/2015 Elsevier Interactive Patient Education  2018 [employer]  

## 2017-08-13 NOTE — Assessment & Plan Note (Deleted)
Patient encouraged to maintain heart healthy diet, regular exercise, adequate sleep. Consider daily probiotics. Take medications as prescribed 

## 2017-08-13 NOTE — Assessment & Plan Note (Signed)
Avoid offending foods, start probiotics. Do not eat large meals in late evening and consider raising head of bed.  

## 2017-08-13 NOTE — Assessment & Plan Note (Signed)
hgba1c acceptable, minimize simple carbs. Increase exercise as tolerated. Continue current meds 

## 2017-08-13 NOTE — Assessment & Plan Note (Signed)
Encouraged heart healthy diet, increase exercise, avoid trans fats, consider a krill oil cap daily 

## 2017-09-14 ENCOUNTER — Ambulatory Visit: Payer: Medicare Other | Admitting: Family Medicine

## 2017-10-16 ENCOUNTER — Other Ambulatory Visit: Payer: Self-pay | Admitting: Family Medicine

## 2017-12-09 ENCOUNTER — Other Ambulatory Visit: Payer: Self-pay | Admitting: Family Medicine

## 2018-01-10 ENCOUNTER — Telehealth: Payer: Self-pay | Admitting: Family Medicine

## 2018-01-10 NOTE — Telephone Encounter (Signed)
Patient would like to know when she is due to come back in for a follow up. Please advise.

## 2018-01-10 NOTE — Telephone Encounter (Signed)
Patient dropping off [place] life insurance document to be filled out documenting screening test performed. Please advise patient when documents are completed. Document placed in provider tray.

## 2018-01-10 NOTE — Telephone Encounter (Signed)
Patient was due to come back 12/14/17 for CPE. Can you schedule CPE with PCP at her next available

## 2018-01-11 ENCOUNTER — Telehealth: Payer: Self-pay | Admitting: *Deleted

## 2018-01-11 NOTE — Telephone Encounter (Signed)
Called pt left message for pt to call us back to schedule a physical with Dr. Charlett Blake.

## 2018-01-11 NOTE — Telephone Encounter (Signed)
Received, noted/SLS 10/01

## 2018-01-18 NOTE — Telephone Encounter (Addendum)
Received Insurance Reimbursement paperwork via [place], medical release signed at front desk is invalid per Larene [place]; patient must come in and sign the Medical Release form included in the [place] and other areas of paperwork need to be completed/signed. LMOM with contact name and number for return call, if needed RE: paperwork issues that need to be corrected before I can send paperwork to our Records department to issue the requested result forms to the insurance company/SLS 10/08  Laverna Peace D 9 days ago      Patient dropping off [place] life insurance document to be filled out documenting screening test performed. Please advise patient when documents are completed. Document placed in provider tray.       Documentation

## 2018-01-24 ENCOUNTER — Other Ambulatory Visit: Payer: Self-pay | Admitting: Family Medicine

## 2018-01-31 ENCOUNTER — Ambulatory Visit (INDEPENDENT_AMBULATORY_CARE_PROVIDER_SITE_OTHER): Payer: Medicare Other | Admitting: Family Medicine

## 2018-01-31 VITALS — BP 142/70 | HR 67 | Temp 97.7°F | Resp 18 | Ht 66.0 in | Wt 184.8 lb

## 2018-01-31 DIAGNOSIS — E782 Mixed hyperlipidemia: Secondary | ICD-10-CM | POA: Diagnosis not present

## 2018-01-31 DIAGNOSIS — Z86010 Personal history of colonic polyps: Secondary | ICD-10-CM

## 2018-01-31 DIAGNOSIS — K449 Diaphragmatic hernia without obstruction or gangrene: Secondary | ICD-10-CM

## 2018-01-31 DIAGNOSIS — L708 Other acne: Secondary | ICD-10-CM

## 2018-01-31 DIAGNOSIS — D1803 Hemangioma of intra-abdominal structures: Secondary | ICD-10-CM

## 2018-01-31 DIAGNOSIS — Z1239 Encounter for other screening for malignant neoplasm of breast: Secondary | ICD-10-CM | POA: Diagnosis not present

## 2018-01-31 DIAGNOSIS — E669 Obesity, unspecified: Secondary | ICD-10-CM | POA: Diagnosis not present

## 2018-01-31 DIAGNOSIS — E1169 Type 2 diabetes mellitus with other specified complication: Secondary | ICD-10-CM | POA: Diagnosis not present

## 2018-01-31 DIAGNOSIS — Z0000 Encounter for general adult medical examination without abnormal findings: Secondary | ICD-10-CM | POA: Diagnosis not present

## 2018-01-31 DIAGNOSIS — T7840XA Allergy, unspecified, initial encounter: Secondary | ICD-10-CM | POA: Diagnosis not present

## 2018-01-31 DIAGNOSIS — K429 Umbilical hernia without obstruction or gangrene: Secondary | ICD-10-CM

## 2018-01-31 DIAGNOSIS — L578 Other skin changes due to chronic exposure to nonionizing radiation: Secondary | ICD-10-CM

## 2018-01-31 DIAGNOSIS — N281 Cyst of kidney, acquired: Secondary | ICD-10-CM | POA: Insufficient documentation

## 2018-01-31 MED ORDER — ENALAPRIL MALEATE 2.5 MG PO TABS: 2.5000 mg | ORAL_TABLET | Freq: Every day | ORAL | 0 refills | Status: DC

## 2018-01-31 MED ORDER — FLUTICASONE PROPIONATE 50 MCG/ACT NA SUSP: 2.0000 | Freq: Every day | NASAL | 6 refills | Status: DC

## 2018-01-31 NOTE — Assessment & Plan Note (Signed)
Has a colonoscopy appointment on 02/04/2018 with Dr Collene Mares

## 2018-01-31 NOTE — Patient Instructions (Addendum)
Shingrix is the new shingles shot. 2 shots over 2-6 months  Please bring [place] a copy of your immunization record so we can abstract proof of Pnuemovax (PCV 23). We have proof of the Prevnar (PCV 13) shot already   Preventive Care 65 Years and Older, Female Preventive care refers to lifestyle choices and visits with your health care provider that can promote health and wellness. What does preventive care include?  A yearly physical exam. This is also called an annual well check.  Dental exams once or twice a year.  Routine eye exams. Ask your health care provider how often you should have your eyes checked.  Personal lifestyle choices, including: ? Daily care of your teeth and gums. ? Regular physical activity. ? Eating a healthy diet. ? Avoiding tobacco and drug use. ? Limiting alcohol use. ? Practicing safe sex. ? Taking low-dose aspirin every day. ? Taking vitamin and mineral supplements as recommended by your health care provider. What happens during an annual well check? The services and screenings done by your health care provider during your annual well check will depend on your age, overall health, lifestyle risk factors, and family history of disease. Counseling Your health care provider may ask you questions about your:  Alcohol use.  Tobacco use.  Drug use.  Emotional well-being.  Home and relationship well-being.  Sexual activity.  Eating habits.  History of falls.  Memory and ability to understand (cognition).  Work and work Statistician.  Reproductive health.  Screening You may have the following tests or measurements:  Height, weight, and BMI.  Blood pressure.  Lipid and cholesterol levels. These may be checked every 5 years, or more frequently if you are over 62 years old.  Skin check.  Lung cancer screening. You may have this screening every year starting at age 27 if you have a 30-pack-year history of smoking and currently smoke or have  quit within the past 15 years.  Fecal occult blood test (FOBT) of the stool. You may have this test every year starting at age 51.  Flexible sigmoidoscopy or colonoscopy. You may have a sigmoidoscopy every 5 years or a colonoscopy every 10 years starting at age 63.  Hepatitis C blood test.  Hepatitis B blood test.  Sexually transmitted disease (STD) testing.  Diabetes screening. This is done by checking your blood sugar (glucose) after you have not eaten for a while (fasting). You may have this done every 1-3 years.  Bone density scan. This is done to screen for osteoporosis. You may have this done starting at age 69.  Mammogram. This may be done every 1-2 years. Talk to your health care provider about how often you should have regular mammograms.  Talk with your health care provider about your test results, treatment options, and if necessary, the need for more tests. Vaccines Your health care provider may recommend certain vaccines, such as:  Influenza vaccine. This is recommended every year.  Tetanus, diphtheria, and acellular pertussis (Tdap, Td) vaccine. You may need a Td booster every 10 years.  Varicella vaccine. You may need this if you have not been vaccinated.  Zoster vaccine. You may need this after age 59.  Measles, mumps, and rubella (MMR) vaccine. You may need at least one dose of MMR if you were born in 1957 or later. You may also need a second dose.  Pneumococcal 13-valent conjugate (PCV13) vaccine. One dose is recommended after age 8.  Pneumococcal polysaccharide (PPSV23) vaccine. One dose is recommended after  age 43.  Meningococcal vaccine. You may need this if you have certain conditions.  Hepatitis A vaccine. You may need this if you have certain conditions or if you travel or work in places where you may be exposed to hepatitis A.  Hepatitis B vaccine. You may need this if you have certain conditions or if you travel or work in places where you may be  exposed to hepatitis B.  Haemophilus influenzae type b (Hib) vaccine. You may need this if you have certain conditions.  Talk to your health care provider about which screenings and vaccines you need and how often you need them. This information is not intended to replace advice given to you by your health care provider. Make sure you discuss any questions you have with your health care provider. Document Released: 04/26/2015 Document Revised: 12/18/2015 Document Reviewed: 01/29/2015 Elsevier Interactive Patient Education  [employer].

## 2018-01-31 NOTE — Assessment & Plan Note (Signed)
Encouraged heart healthy diet, increase exercise, avoid trans fats, consider a krill oil cap daily 

## 2018-01-31 NOTE — Assessment & Plan Note (Signed)
Encouraged DASH diet, decrease po intake and increase exercise as tolerated. Needs 7-8 hours of sleep nightly. Avoid trans fats, eat small, frequent meals every 4-5 hours with lean proteins, complex carbs and healthy fats. Minimize simple carbs 

## 2018-01-31 NOTE — Assessment & Plan Note (Signed)
asymptomatic

## 2018-01-31 NOTE — Assessment & Plan Note (Signed)
hgba1c acceptable, minimize simple carbs. Increase exercise as tolerated.  

## 2018-01-31 NOTE — Assessment & Plan Note (Signed)
Lesions on back improved but lesions under abdominal panus continue to flare. She is referred to dermatology and will consider plastic surgery if problem persists. For now use Cetaphil soap and witch hazel astringent to cleanse affected area

## 2018-01-31 NOTE — Assessment & Plan Note (Signed)
Small, asymptomatic

## 2018-01-31 NOTE — Progress Notes (Signed)
Subjective:    Patient ID: Vicki Dennis, female    DOB: 12-23-1945, 72 y.o.   MRN: [id number]  No chief complaint on file.   HPI Patient is in today for preventative exam and follow-up on chronic medical concerns.  She feels well today.  No recent febrile illness or hospitalizations.  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  She reports some persistent concerns with recurrent follicular lesions on her abdomen most notably under the pannus.  The lesions on her back have improved.  No fevers or chills.  She manages to stay busy with regular exercise and tries to maintain a heart healthy diet with lean proteins and frequent vegetable intake.  No polyuria or polydipsia. Denies CP/palp/SOB/HA/congestion/fevers/GI or GU c/o. Taking meds as prescribed  Past Medical History:  Diagnosis Date  . Allergy   . Anemia    h/o  . Asthma   . Asthma, mild intermittent, well-controlled 02/10/2014  . Blood transfusion without reported diagnosis   . Chicken pox as a child  . Diabetes mellitus    type-II, per Dr. Parke Simmers, no meds. , pt. reports weight loss has contributed  to control of diabetes.  She reports last HgbA1c- wnl.  . Diabetes mellitus 11/05/2010  . Diabetes mellitus type 2 in obese ([facility]) 11/05/2010  . Diabetes mellitus type 2, uncontrolled ([facility]) 11/05/2010  . Esophageal reflux 02/10/2014  . GERD (gastroesophageal reflux disease)    rare use of OTC aid  . Hyperglycemia 09/02/2015  . Hyperlipidemia   . Hypertension   . Measles as a child  . Medicare annual wellness visit, subsequent 08/06/2014  . Mumps as a child  . Obesity 02/10/2014  . Pneumonia    as a child  . Portal vein thrombosis 03/09/2011   coumadin ended summer of 2013  . Preventative health care 08/12/2014  . Rash and nonspecific skin eruption 01/07/2015  . Seborrheic keratosis 02/10/2014   Follows with dermatology  . Sinusitis, acute 03/23/2016    Past Surgical History:  Procedure Laterality  Date  . BREAST SURGERY     benign - cysts  . INSERTION OF MESH  03/16/2012   Procedure: INSERTION OF MESH;  Surgeon: Shelly Rubenstein, MD;  Location: [facility];  Service: General;  Laterality: N/A;  . KNEE ARTHROSCOPY W/ MENISCAL REPAIR     both knees  . TONSILLECTOMY    . UMBILICAL HERNIA REPAIR  03/16/2012   Procedure: HERNIA REPAIR UMBILICAL ADULT;  Surgeon: Shelly Rubenstein, MD;  Location: [facility] OR;  Service: General;  Laterality: N/A;    Family History  Problem Relation Age of Onset  . Cancer Father        prostate  . Aneurysm Mother 5       brain  . Cancer Maternal Aunt     Social History   Socioeconomic History  . Marital status: Divorced    Spouse name: Not on file  . Number of children: Not on file  . Years of education: Not on file  . Highest education level: Not on file  Occupational History  . Not on file  Social Needs  . Financial resource strain: Not on file  . Food insecurity:    Worry: Not on file    Inability: Not on file  . Transportation needs:    Medical: Not on file    Non-medical: Not on file  Tobacco Use  . Smoking status: Never Smoker  . Smokeless tobacco: Never Used  .  Subjective:    Patient ID: Vicki Dennis, female    DOB: 1945/11/15, 72 y.o.   MRN: [id number]  No chief complaint on file.   HPI Patient is in today for preventative exam and follow-up on chronic medical concerns.  She feels well today.  No recent febrile illness or hospitalizations.  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  She reports some persistent concerns with recurrent follicular lesions on her abdomen most notably under the pannus.  The lesions on her back have improved.  No fevers or chills.  She manages to stay busy with regular exercise and tries to maintain a heart healthy diet with lean proteins and frequent vegetable intake.  No polyuria or polydipsia. Denies CP/palp/SOB/HA/congestion/fevers/GI or GU c/o. Taking meds as prescribed  Past Medical History:  Diagnosis Date  . Allergy   . Anemia    h/o  . Asthma   . Asthma, mild intermittent, well-controlled 02/10/2014  . Blood transfusion without reported diagnosis   . Chicken pox as a child  . Diabetes mellitus    type-II, per Dr. Criss Rosales, no meds. , pt. reports weight loss has contributed  to control of diabetes.  She reports last HgbA1c- wnl.  . Diabetes mellitus 11/05/2010  . Diabetes mellitus type 2 in obese ([facility]) 11/05/2010  . Diabetes mellitus type 2, uncontrolled ([facility]) 11/05/2010  . Esophageal reflux 02/10/2014  . GERD (gastroesophageal reflux disease)    rare use of OTC aid  . Hyperglycemia 09/02/2015  . Hyperlipidemia   . Hypertension   . Measles as a child  . Medicare annual wellness visit, subsequent 08/06/2014  . Mumps as a child  . Obesity 02/10/2014  . Pneumonia    as a child  . Portal vein thrombosis 03/09/2011   coumadin ended summer of 2013  . Preventative health care 08/12/2014  . Rash and nonspecific skin eruption 01/07/2015  . Seborrheic keratosis 02/10/2014   Follows with dermatology  . Sinusitis, acute 03/23/2016    Past Surgical History:  Procedure Laterality  Date  . BREAST SURGERY     benign - cysts  . INSERTION OF MESH  03/16/2012   Procedure: INSERTION OF MESH;  Surgeon: Harl Bowie, MD;  Location: [facility];  Service: General;  Laterality: N/A;  . KNEE ARTHROSCOPY W/ MENISCAL REPAIR     both knees  . TONSILLECTOMY    . UMBILICAL HERNIA REPAIR  03/16/2012   Procedure: HERNIA REPAIR UMBILICAL ADULT;  Surgeon: Harl Bowie, MD;  Location: [facility];  Service: General;  Laterality: N/A;    Family History  Problem Relation Age of Onset  . Cancer Father        prostate  . Aneurysm Mother 83       brain  . Cancer Maternal Aunt     Social History   Socioeconomic History  . Marital status: Divorced    Spouse name: Not on file  . Number of children: Not on file  . Years of education: Not on file  . Highest education level: Not on file  Occupational History  . Not on file  Social Needs  . Financial resource strain: Not on file  . Food insecurity:    Worry: Not on file    Inability: Not on file  . Transportation needs:    Medical: Not on file    Non-medical: Not on file  Tobacco Use  . Smoking status: Never Smoker  . Smokeless tobacco: Never Used  .  Subjective:    Patient ID: Vicki Dennis, female    DOB: 1945/11/15, 72 y.o.   MRN: [id number]  No chief complaint on file.   HPI Patient is in today for preventative exam and follow-up on chronic medical concerns.  She feels well today.  No recent febrile illness or hospitalizations.  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  She reports some persistent concerns with recurrent follicular lesions on her abdomen most notably under the pannus.  The lesions on her back have improved.  No fevers or chills.  She manages to stay busy with regular exercise and tries to maintain a heart healthy diet with lean proteins and frequent vegetable intake.  No polyuria or polydipsia. Denies CP/palp/SOB/HA/congestion/fevers/GI or GU c/o. Taking meds as prescribed  Past Medical History:  Diagnosis Date  . Allergy   . Anemia    h/o  . Asthma   . Asthma, mild intermittent, well-controlled 02/10/2014  . Blood transfusion without reported diagnosis   . Chicken pox as a child  . Diabetes mellitus    type-II, per Dr. Criss Rosales, no meds. , pt. reports weight loss has contributed  to control of diabetes.  She reports last HgbA1c- wnl.  . Diabetes mellitus 11/05/2010  . Diabetes mellitus type 2 in obese ([facility]) 11/05/2010  . Diabetes mellitus type 2, uncontrolled ([facility]) 11/05/2010  . Esophageal reflux 02/10/2014  . GERD (gastroesophageal reflux disease)    rare use of OTC aid  . Hyperglycemia 09/02/2015  . Hyperlipidemia   . Hypertension   . Measles as a child  . Medicare annual wellness visit, subsequent 08/06/2014  . Mumps as a child  . Obesity 02/10/2014  . Pneumonia    as a child  . Portal vein thrombosis 03/09/2011   coumadin ended summer of 2013  . Preventative health care 08/12/2014  . Rash and nonspecific skin eruption 01/07/2015  . Seborrheic keratosis 02/10/2014   Follows with dermatology  . Sinusitis, acute 03/23/2016    Past Surgical History:  Procedure Laterality  Date  . BREAST SURGERY     benign - cysts  . INSERTION OF MESH  03/16/2012   Procedure: INSERTION OF MESH;  Surgeon: Harl Bowie, MD;  Location: [facility];  Service: General;  Laterality: N/A;  . KNEE ARTHROSCOPY W/ MENISCAL REPAIR     both knees  . TONSILLECTOMY    . UMBILICAL HERNIA REPAIR  03/16/2012   Procedure: HERNIA REPAIR UMBILICAL ADULT;  Surgeon: Harl Bowie, MD;  Location: [facility];  Service: General;  Laterality: N/A;    Family History  Problem Relation Age of Onset  . Cancer Father        prostate  . Aneurysm Mother 83       brain  . Cancer Maternal Aunt     Social History   Socioeconomic History  . Marital status: Divorced    Spouse name: Not on file  . Number of children: Not on file  . Years of education: Not on file  . Highest education level: Not on file  Occupational History  . Not on file  Social Needs  . Financial resource strain: Not on file  . Food insecurity:    Worry: Not on file    Inability: Not on file  . Transportation needs:    Medical: Not on file    Non-medical: Not on file  Tobacco Use  . Smoking status: Never Smoker  . Smokeless tobacco: Never Used  .  Subjective:    Patient ID: Vicki Dennis, female    DOB: 1945/11/15, 72 y.o.   MRN: [id number]  No chief complaint on file.   HPI Patient is in today for preventative exam and follow-up on chronic medical concerns.  She feels well today.  No recent febrile illness or hospitalizations.  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  She reports some persistent concerns with recurrent follicular lesions on her abdomen most notably under the pannus.  The lesions on her back have improved.  No fevers or chills.  She manages to stay busy with regular exercise and tries to maintain a heart healthy diet with lean proteins and frequent vegetable intake.  No polyuria or polydipsia. Denies CP/palp/SOB/HA/congestion/fevers/GI or GU c/o. Taking meds as prescribed  Past Medical History:  Diagnosis Date  . Allergy   . Anemia    h/o  . Asthma   . Asthma, mild intermittent, well-controlled 02/10/2014  . Blood transfusion without reported diagnosis   . Chicken pox as a child  . Diabetes mellitus    type-II, per Dr. Criss Rosales, no meds. , pt. reports weight loss has contributed  to control of diabetes.  She reports last HgbA1c- wnl.  . Diabetes mellitus 11/05/2010  . Diabetes mellitus type 2 in obese ([facility]) 11/05/2010  . Diabetes mellitus type 2, uncontrolled ([facility]) 11/05/2010  . Esophageal reflux 02/10/2014  . GERD (gastroesophageal reflux disease)    rare use of OTC aid  . Hyperglycemia 09/02/2015  . Hyperlipidemia   . Hypertension   . Measles as a child  . Medicare annual wellness visit, subsequent 08/06/2014  . Mumps as a child  . Obesity 02/10/2014  . Pneumonia    as a child  . Portal vein thrombosis 03/09/2011   coumadin ended summer of 2013  . Preventative health care 08/12/2014  . Rash and nonspecific skin eruption 01/07/2015  . Seborrheic keratosis 02/10/2014   Follows with dermatology  . Sinusitis, acute 03/23/2016    Past Surgical History:  Procedure Laterality  Date  . BREAST SURGERY     benign - cysts  . INSERTION OF MESH  03/16/2012   Procedure: INSERTION OF MESH;  Surgeon: Harl Bowie, MD;  Location: [facility];  Service: General;  Laterality: N/A;  . KNEE ARTHROSCOPY W/ MENISCAL REPAIR     both knees  . TONSILLECTOMY    . UMBILICAL HERNIA REPAIR  03/16/2012   Procedure: HERNIA REPAIR UMBILICAL ADULT;  Surgeon: Harl Bowie, MD;  Location: [facility];  Service: General;  Laterality: N/A;    Family History  Problem Relation Age of Onset  . Cancer Father        prostate  . Aneurysm Mother 83       brain  . Cancer Maternal Aunt     Social History   Socioeconomic History  . Marital status: Divorced    Spouse name: Not on file  . Number of children: Not on file  . Years of education: Not on file  . Highest education level: Not on file  Occupational History  . Not on file  Social Needs  . Financial resource strain: Not on file  . Food insecurity:    Worry: Not on file    Inability: Not on file  . Transportation needs:    Medical: Not on file    Non-medical: Not on file  Tobacco Use  . Smoking status: Never Smoker  . Smokeless tobacco: Never Used  .  her maintain her multivitamin with minerals, aspirin, FISH OIL CONCENTRATE, Lancets 30G, azelastine, EPIPEN 2-PAK, ranitidine, fluticasone furoate-vilanterol, albuterol, ACCU-CHEK AVIVA PLUS, montelukast, and fluticasone.  Meds ordered this encounter  Medications  . enalapril (VASOTEC) 2.5 MG tablet    Sig: Take 1 tablet (2.5 mg total) by mouth daily.    Dispense:  90 tablet    Refill:  0  . fluticasone (FLONASE) 50 MCG/ACT nasal spray    Sig: Place 2 sprays into both nostrils daily.    Dispense:  16 g    Refill:  6     Danise Edge, MD

## 2018-01-31 NOTE — Assessment & Plan Note (Addendum)
Asymptomatic but with numerous other findings on CT abd will proceed with repeat imaging.

## 2018-01-31 NOTE — Assessment & Plan Note (Addendum)
Patient encouraged to maintain heart healthy diet, regular exercise, adequate sleep. Consider daily probiotics. Take medications as prescribed. Given and reviewed copy of ACP documents from [employer] and encouraged to complete and return. She believes she had pneumovax at health department and will bring [place] the record.

## 2018-01-31 NOTE — Assessment & Plan Note (Signed)
Does not tolerate Astelin causes Nausea. Try Ginger 1/2 hour prior

## 2018-02-09 ENCOUNTER — Telehealth: Payer: Self-pay | Admitting: *Deleted

## 2018-02-09 NOTE — Telephone Encounter (Signed)
Received Colonoscopy results from [facility]; forwarded to provider/SLS 10/30

## 2018-03-07 ENCOUNTER — Other Ambulatory Visit: Payer: Self-pay | Admitting: Family Medicine

## 2018-03-18 ENCOUNTER — Telehealth: Payer: Self-pay

## 2018-03-18 NOTE — Telephone Encounter (Signed)
Await report

## 2018-03-18 NOTE — Telephone Encounter (Signed)
Copied from [place] [id number]. Topic: General - Other >> Mar 18, 2018  3:35 PM Leward Quan A wrote: Reason for CRM: Rogelia Mire NP with [facility] called to give PAD normal range is (1.00 and 1.40) Rt leg 0.98 mild insufficiency  Lt 0.77 moderate insufficiency  No pain or calf tenderness with walking  Whole assessment will be sent to Dr Charlett Blake

## 2018-03-31 ENCOUNTER — Telehealth: Payer: Self-pay | Admitting: Family Medicine

## 2018-03-31 NOTE — Telephone Encounter (Signed)
Copied from [place] [phone]. Topic: Quick Communication - See Telephone Encounter >> Mar 31, 2018 10:56 AM Rosalin Hawking wrote: CRM for notification. See Telephone encounter for: 03/31/18.   Pt dropped off document for provider to have on pt's chart (copy of Power of Attorney - copy of [place] visit info and copy of Certificate of Vaccination) Document put at front office tray under providers name.

## 2018-04-01 NOTE — Telephone Encounter (Signed)
Received Advance Directive, Healthcare POA & Living Will, Copy of Colonoscopy Report, and Immunization Report; forwarded all to provider for review & initialing/SLS 12/20

## 2018-04-22 ENCOUNTER — Telehealth: Payer: Self-pay | Admitting: Family Medicine

## 2018-04-22 ENCOUNTER — Ambulatory Visit (HOSPITAL_BASED_OUTPATIENT_CLINIC_OR_DEPARTMENT_OTHER)
Admission: RE | Admit: 2018-04-22 | Discharge: 2018-04-22 | Disposition: A | Discharge status: Home / Self Care | Payer: Medicare Other | Source: Ambulatory Visit | Attending: Family Medicine | Admitting: Family Medicine

## 2018-04-22 DIAGNOSIS — K449 Diaphragmatic hernia without obstruction or gangrene: Secondary | ICD-10-CM | POA: Insufficient documentation

## 2018-04-22 DIAGNOSIS — Z1231 Encounter for screening mammogram for malignant neoplasm of breast: Secondary | ICD-10-CM | POA: Diagnosis not present

## 2018-04-22 DIAGNOSIS — N281 Cyst of kidney, acquired: Secondary | ICD-10-CM | POA: Insufficient documentation

## 2018-04-22 DIAGNOSIS — Z1239 Encounter for other screening for malignant neoplasm of breast: Secondary | ICD-10-CM | POA: Diagnosis not present

## 2018-04-22 DIAGNOSIS — K429 Umbilical hernia without obstruction or gangrene: Secondary | ICD-10-CM | POA: Insufficient documentation

## 2018-04-22 DIAGNOSIS — D1803 Hemangioma of intra-abdominal structures: Secondary | ICD-10-CM | POA: Diagnosis present

## 2018-04-22 NOTE — Telephone Encounter (Signed)
Pt dropped off copy of her shingix shot that she got done at the Pharmacy for provider to have on pt's chart. Document put at front office tray under provider name.

## 2018-04-22 NOTE — Telephone Encounter (Signed)
Copied from [place] [id number]. Topic: Quick Communication - See Telephone Encounter >> Apr 22, 2018  2:29 PM Vernona Rieger wrote: CRM for notification. See Telephone encounter for: 04/22/18.  Patient state she had a CT this morning and is aware they are back. She would like the nurse to call back, [phone]

## 2018-04-29 NOTE — Telephone Encounter (Signed)
Patient viewed via mychart

## 2018-05-13 ENCOUNTER — Ambulatory Visit: Payer: Medicare Other | Admitting: Medical

## 2018-05-13 ENCOUNTER — Encounter: Payer: Self-pay | Admitting: Medical

## 2018-05-13 ENCOUNTER — Other Ambulatory Visit: Payer: Self-pay | Admitting: Medical

## 2018-05-13 VITALS — BP 166/69 | HR 81 | Temp 98.7°F | Resp 16 | Ht 66.0 in | Wt 198.8 lb

## 2018-05-13 DIAGNOSIS — R059 Cough, unspecified: Secondary | ICD-10-CM

## 2018-05-13 DIAGNOSIS — T7840XA Allergy, unspecified, initial encounter: Secondary | ICD-10-CM

## 2018-05-13 DIAGNOSIS — R591 Generalized enlarged lymph nodes: Secondary | ICD-10-CM | POA: Diagnosis not present

## 2018-05-13 DIAGNOSIS — J0100 Acute maxillary sinusitis, unspecified: Secondary | ICD-10-CM

## 2018-05-13 DIAGNOSIS — H6122 Impacted cerumen, left ear: Secondary | ICD-10-CM | POA: Diagnosis not present

## 2018-05-13 DIAGNOSIS — R05 Cough: Secondary | ICD-10-CM

## 2018-05-13 MED ORDER — BENZONATATE 100 MG PO CAPS: 100.0000 mg | ORAL_CAPSULE | Freq: Three times a day (TID) | ORAL | 0 refills | Status: DC | PRN

## 2018-05-13 MED ORDER — AMOXICILLIN-POT CLAVULANATE 875-125 MG PO TABS: 1.0000 | ORAL_TABLET | Freq: Two times a day (BID) | ORAL | 0 refills | Status: DC

## 2018-05-13 MED ORDER — FLUTICASONE PROPIONATE 50 MCG/ACT NA SUSP: 2.0000 | Freq: Every day | NASAL | 1 refills | Status: DC

## 2018-05-13 NOTE — Patient Instructions (Addendum)
You appear to have  sinusitis. Rest hydrate and tylenol for fever. I am prescribing cough medicine benzonatate, and augmentin antibiotic. For your nasal congestion rx flonase.  For cerumen impaction use debrox otc daily and then follow up in 5 days for lavage.  Also on follow up will get cbc and recheck lymph node.  Follow up in 5 days or pcp or as needed

## 2018-05-13 NOTE — Progress Notes (Signed)
Subjective:    Patient ID: Vicki Dennis, female    DOB: [birth year], 73 y.o.   MRN: [id number]  HPI  Pt in for follow evaluation.  Pt in stating  coughing, nasal congestion, sinus pressure, some ear pressure and swollen submandibular lymph node.Pt feel pnd. Stats tastes mucus.  Pt does not report any tooth pain.  No fever,no chills or sweats.  Pt has no ear pain.      Review of Systems  Constitutional: Negative for chills, fatigue and fever.  HENT: Positive for congestion, sinus pressure and sinus pain. Negative for mouth sores, nosebleeds, postnasal drip and sore throat.   Respiratory: Positive for cough. Negative for chest tightness, shortness of breath and wheezing.   Cardiovascular: Negative for chest pain and palpitations.  Gastrointestinal: Negative for abdominal pain.  Musculoskeletal: Negative for back pain and myalgias.  Skin: Negative for rash.  Hematological: Positive for adenopathy. Does not bruise/bleed easily.  Psychiatric/Behavioral: Negative for behavioral problems, confusion and hallucinations. The patient is not hyperactive.     Past Medical History:  Diagnosis Date  . Allergy   . Anemia    h/o  . Asthma   . Asthma, mild intermittent, well-controlled 02/10/2014  . Blood transfusion without reported diagnosis   . Chicken pox as a child  . Diabetes mellitus    type-II, per Dr. Parke Simmers, no meds. , pt. reports weight loss has contributed  to control of diabetes.  She reports last HgbA1c- wnl.  . Diabetes mellitus 11/05/2010  . Diabetes mellitus type 2 in obese (HCC) 11/05/2010  . Diabetes mellitus type 2, uncontrolled (HCC) 11/05/2010  . Esophageal reflux 02/10/2014  . GERD (gastroesophageal reflux disease)    rare use of OTC aid  . Hyperglycemia 09/02/2015  . Hyperlipidemia   . Hypertension   . Measles as a child  . Medicare annual wellness visit, subsequent 08/06/2014  . Mumps as a child  . Obesity 02/10/2014  . Pneumonia    as a child  . Portal  vein thrombosis 03/09/2011   coumadin ended summer of 2013  . Preventative health care 08/12/2014  . Rash and nonspecific skin eruption 01/07/2015  . Seborrheic keratosis 02/10/2014   Follows with dermatology  . Sinusitis, acute 03/23/2016     Social History   Socioeconomic History  . Marital status: Divorced    Spouse name: Not on file  . Number of children: Not on file  . Years of education: Not on file  . Highest education level: Not on file  Occupational History  . Not on file  Social Needs  . Financial resource strain: Not on file  . Food insecurity:    Worry: Not on file    Inability: Not on file  . Transportation needs:    Medical: Not on file    Non-medical: Not on file  Tobacco Use  . Smoking status: Never Smoker  . Smokeless tobacco: Never Used  . Tobacco comment: never used tobacco  Substance and Sexual Activity  . Alcohol use: Yes    Alcohol/week: 0.0 standard drinks    Comment: for holidays  . Drug use: No  . Sexual activity: Never    Comment: lives alone   Lifestyle  . Physical activity:    Days per week: Not on file    Minutes per session: Not on file  . Stress: Not on file  Relationships  . Social connections:    Talks on phone: Not on file    Gets together: Not on  file    Attends religious service: Not on file    Active member of club or organization: Not on file    Attends meetings of clubs or organizations: Not on file    Relationship status: Not on file  . Intimate partner violence:    Fear of current or ex partner: Not on file    Emotionally abused: Not on file    Physically abused: Not on file    Forced sexual activity: Not on file  Other Topics Concern  . Not on file  Social History Narrative  . Not on file    Past Surgical History:  Procedure Laterality Date  . BREAST SURGERY     benign - cysts  . INSERTION OF MESH  03/16/2012   Procedure: INSERTION OF MESH;  Surgeon: Shelly Rubenstein, MD;  Location: [facility] OR;  Service: General;   Laterality: N/A;  . KNEE ARTHROSCOPY W/ MENISCAL REPAIR     both knees  . TONSILLECTOMY    . UMBILICAL HERNIA REPAIR  03/16/2012   Procedure: HERNIA REPAIR UMBILICAL ADULT;  Surgeon: Shelly Rubenstein, MD;  Location: [facility] OR;  Service: General;  Laterality: N/A;    Family History  Problem Relation Age of Onset  . Cancer Father        prostate  . Aneurysm Mother 74       brain  . Cancer Maternal Aunt     Allergies  Allergen Reactions  . Codeine Other (See Comments)    "makes me crazy"  . Sulfa Antibiotics Anaphylaxis    "died 3 times".  . Oxycodone     Current Outpatient Medications on File Prior to Visit  Medication Sig Dispense Refill  . ACCU-CHEK AVIVA PLUS test strip USE AS DIRECTED ONCE A DAY TO CHECK BLOOD SUGAR  100 each 0  . albuterol (PROVENTIL HFA) 108 (90 Base) MCG/ACT inhaler Inhale 2 puffs into the lungs every 4 (four) hours as needed for wheezing or shortness of breath. 18 g 1  . aspirin 81 MG tablet Take 81 mg by mouth daily.     Marland Kitchen azelastine (ASTELIN) 0.1 % nasal spray Place 2 sprays into both nostrils 2 (two) times daily. Use in each nostril as directed 30 mL 6  . enalapril (VASOTEC) 2.5 MG tablet Take 1 tablet (2.5 mg total) by mouth daily. 90 tablet 0  . EPIPEN 2-PAK 0.3 MG/0.3ML SOAJ injection Use as directed for severe allergic reactions. 2 Device 1  . fluticasone (FLONASE) 50 MCG/ACT nasal spray Place 2 sprays into both nostrils daily. 16 g 6  . fluticasone furoate-vilanterol (BREO ELLIPTA) 100-25 MCG/INH AEPB INHALE 1 PUFF BY MOUTH ONCE DAILY 28 each 5  . Lancets 30G MISC Use as directed once daily to check blood sugar.  Diagnosis code E11.9 100 each 5  . montelukast (SINGULAIR) 10 MG tablet take 1 tablet by mouth at bedtime 90 tablet 1  . Multiple Vitamins-Minerals (MULTIVITAMIN WITH MINERALS) tablet Take 1 tablet by mouth daily.      . Omega-3 Fatty Acids (FISH OIL CONCENTRATE) 1000 MG CAPS Take by mouth 3 (three) times daily.    . ranitidine (ZANTAC) 75  MG tablet Take 75 mg by mouth 2 (two) times daily.     No current facility-administered medications on file prior to visit.     BP (!) 166/69   Pulse 81   Temp 98.7 F (37.1 C) (Oral)   Resp 16   Ht 5\' 6"  (1.676 m)   Wt 198  lb 12.8 oz (90.2 kg)   SpO2 100%   BMI 32.09 kg/m       Objective:   Physical Exam  General  Mental Status - Alert. General Appearance - Well groomed. Not in acute distress.  Skin Rashes- No Rashes.  HEENT Head- Normal. Ear Auditory Canal - Left- Normal. Right - Normal.Tympanic Membrane- Left- canal obstructed with waxl. Right- Normal. Eye Sclera/Conjunctiva- Left- Normal. Right- Normal. Nose & Sinuses Nasal Mucosa- Left-  Boggy and Congested. Right-  Boggy and  Congested.Bilateral maxillary and frontal sinus pressure. Mouth & Throat Lips: Upper Lip- Normal: no dryness, cracking, pallor, cyanosis, or vesicular eruption. Lower Lip-Normal: no dryness, cracking, pallor, cyanosis or vesicular eruption. Buccal Mucosa- Bilateral- No Aphthous ulcers. Oropharynx- No Discharge or Erythema. +pnd Tonsils: Characteristics- Bilateral- No Erythema or Congestion. Size/Enlargement- Bilateral- No enlargement. Discharge- bilateral-None.  Neck Neck- Supple. No Masses. Left submandibular node tenderness. Mild enlarged.   Chest and Lung Exam Auscultation: Breath Sounds:-Clear even and unlabored.  Cardiovascular Auscultation:Rythm- Regular, rate and rhythm. Murmurs & Other Heart Sounds:Ausculatation of the heart reveal- No Murmurs.  Lymphatic Head & Neck General Head & Neck Lymphatics: Bilateral: Description- see neck exam.       Assessment & Plan:  You appear to have  sinusitis. Rest hydrate and tylenol for fever. I am prescribing cough medicine benzonatate, and augmentin antibiotic. For your nasal congestion rx flonase.  For cerumen impaction use debrox otc daily and then follow up in 5 days for lavage.  Also on follow up will get cbc and recheck lymph  node.  Follow up in 5 days or pcp or as needed

## 2018-06-17 ENCOUNTER — Other Ambulatory Visit: Payer: Self-pay | Admitting: Family Medicine

## 2018-07-02 ENCOUNTER — Other Ambulatory Visit: Payer: Self-pay | Admitting: Family Medicine

## 2018-07-07 ENCOUNTER — Other Ambulatory Visit: Payer: Self-pay | Admitting: Allergy & Immunology

## 2018-07-12 ENCOUNTER — Ambulatory Visit: Payer: Medicare Other | Admitting: Allergy & Immunology

## 2018-07-12 ENCOUNTER — Other Ambulatory Visit: Payer: Self-pay

## 2018-07-12 ENCOUNTER — Encounter: Payer: Self-pay | Admitting: Allergy & Immunology

## 2018-07-12 VITALS — BP 150/90 | HR 84 | Temp 98.2°F | Resp 16 | Ht 67.0 in | Wt 191.2 lb

## 2018-07-12 DIAGNOSIS — J302 Other seasonal allergic rhinitis: Secondary | ICD-10-CM | POA: Diagnosis not present

## 2018-07-12 DIAGNOSIS — J3089 Other allergic rhinitis: Secondary | ICD-10-CM | POA: Diagnosis not present

## 2018-07-12 DIAGNOSIS — K219 Gastro-esophageal reflux disease without esophagitis: Secondary | ICD-10-CM | POA: Diagnosis not present

## 2018-07-12 DIAGNOSIS — J4540 Moderate persistent asthma, uncomplicated: Secondary | ICD-10-CM | POA: Diagnosis not present

## 2018-07-12 MED ORDER — FLUTICASONE PROPIONATE 50 MCG/ACT NA SUSP: 2.0000 | Freq: Every day | NASAL | 5 refills | Status: DC

## 2018-07-12 MED ORDER — KETOTIFEN FUMARATE 0.025 % OP SOLN: 1.0000 [drp] | Freq: Two times a day (BID) | OPHTHALMIC | 0 refills | Status: DC

## 2018-07-12 MED ORDER — ALBUTEROL SULFATE HFA 108 (90 BASE) MCG/ACT IN AERS: 2.0000 | INHALATION_SPRAY | RESPIRATORY_TRACT | 1 refills | Status: DC | PRN

## 2018-07-12 MED ORDER — MONTELUKAST SODIUM 10 MG PO TABS: 10.0000 mg | ORAL_TABLET | Freq: Every day | ORAL | 5 refills | Status: DC

## 2018-07-12 MED ORDER — EPIPEN 2-PAK 0.3 MG/0.3ML IJ SOAJ: INTRAMUSCULAR | 2 refills | Status: DC

## 2018-07-12 NOTE — Progress Notes (Signed)
FOLLOW UP  Date of Service/Encounter:  07/12/18   Assessment:   Moderate persistent asthma without complication  Seasonal and perennial allergic rhinitis (grasses, weeds, molds, dust mites)  Gastroesophageal reflux disease   Asthma Reportables:  Severity: moderate persistent  Risk: low Control: well controlled  Plan/Recommendations:   1. Moderate persistent asthma, uncomplicated - Lung testing deferred today.  - You sounded good without wheezing today, but you can always use your rescue medication if needed for coughing or wheezing.  - Daily controller medication(s): Breo 100/25 one puff once daily + montelukast 10mg  daily - Rescue medications: albuterol 4 puffs every 4-6 hours as needed - Asthma control goals:  * Full participation in all desired activities (may need albuterol before activity) * Albuterol use two time or less a week on average (not counting use with activity) * Cough interfering with sleep two time or less a month * Oral steroids no more than once a year * No hospitalizations  2. Perennial and seasonal rhinitis (grasses, weeds, molds, dust mites) - Add on an eye drop: Zatidor one drop per eye twice daily (script will be sent in to see if your insurance/Medicare covers it). - Restart the Zyrtec (cetirizine) 10mg  daily (this is NOT Zantac, which was removed from the market) - Start the prednisone pack provided today.  - Consider allergy shots for long term control.   3. Return in about 6 months (around 01/11/2019). This can be an in-person or a virtual WebEx follow up visit.   Subjective:   Vicki Dennis is a 73 y.o. female presenting today for follow up of  Chief Complaint  Patient presents with  . Asthma    coughing, SOB, chest tightness, coughing at night x 3 days  . Allergic Rhinitis     eyes swelling, watery, running    Vicki Dennis has a history of the following: Patient Active Problem List   Diagnosis Date Noted  . Hiatal hernia  01/31/2018  . Hemangioma of liver 01/31/2018  . Renal cyst 01/31/2018  . Seasonal and perennial allergic rhinitis 06/17/2017  . Moderate persistent asthma without complication 76/16/0737  . Abdominal mass 04/09/2017  . Sinusitis 03/23/2016  . Follicular acne 10/62/6948  . Preventative health care 08/12/2014  . Medicare annual wellness visit, subsequent 08/06/2014  . Obesity 02/10/2014  . History of colonic polyps 02/10/2014  . Gastroesophageal reflux disease 02/10/2014  . Seborrheic keratosis 02/10/2014  . Asthma, mild intermittent, well-controlled 02/10/2014  . Chicken pox   . Measles   . Mumps   . Allergy   . Umbilical hernia 54/62/7035  . Portal vein thrombosis 03/09/2011  . Diabetes mellitus type 2 in obese ([place]) 11/05/2010  . Hyperlipidemia 11/05/2010    History obtained from: chart review and patient.  Vicki Dennis is a 73 y.o. female presenting for a follow up visit.  She was last seen in March 2019.  At that time, her lung testing looked excellent.  We continued Breo 100/25 mcg 1 puff once daily in combination with montelukast 10 mg daily.  She has albuterol to use as needed for flares.  For her chronic rhinitis we continued her nasal spray 1 to 2 sprays per nostril daily and Zyrtec 10 mg daily.  We continued Zantac for her GERD.  We asked her to follow back up in 6 months but she presents now almost a year later.  Her last testing was performed in May 2008.  At that time, she was positive to all of the grasses,  weed mix, mold mix 2, and dust mite.  In the interim, she has done fairly well.  However, over the last 2 weeks she has developed eye itching and periorbital edema.  She has been sneezing more and has had more postnasal drip.  It seems that she has not taken any of her medications, however.  I asked her if she was on Zyrtec and she told me it was taken off the market because it caused cancer.  However, I finally clarified that this was Zantac rather than Zyrtec.  She is open  to trying Zyrtec again.  She has been using some Visine over-the-counter with minimal relief.  She has been on Zaditor in the past, but did not want to pay for it out of pocket, as it is available over-the-counter at this point.  She is not using her nasal spray at all.  This tends to be the worst time of the year for her.   Regarding her asthma, she does report that she has been coughing and wheezing as of late.  She does have her Breo with her, but she tells me that she only uses it on an as needed basis.  She has not needed prednisone at all since last visit.  She tends to only have issues with her breathing in the springtime. ACT is 21, indicating excellent asthma control.  She has not tried using her rescue inhaler with her current combination of medications.  She tells me that she does not want these medications to "build up" in her system.  She tells me that she has a "degree in biology" and she knows "what medicines do to a body".  Regarding her reflux, she is no longer taking the Zantac.  She thinks that the recent fear of the coronavirus and other stressors have "scared the reflux out of [her] body".  Otherwise, there have been no changes to her past medical history, surgical history, family history, or social history.  She has maintained social distancing during the last few weeks due to the coronavirus pandemic.  This is a change for her because she previously was a social butterfly and enjoyed traveling with friends.    Review of Systems  Constitutional: Negative.  Negative for fever, malaise/fatigue and weight loss.  HENT: Positive for sore throat. Negative for congestion, ear discharge, ear pain and sinus pain.   Eyes: Negative for pain, discharge and redness.  Respiratory: Positive for cough and wheezing. Negative for sputum production and shortness of breath.   Cardiovascular: Negative.  Negative for chest pain and palpitations.  Gastrointestinal: Negative for abdominal pain,  heartburn, nausea and vomiting.  Musculoskeletal: Negative for myalgias.  Skin: Negative.  Negative for itching and rash.  Neurological: Negative for dizziness and headaches.  Endo/Heme/Allergies: Positive for environmental allergies. Does not bruise/bleed easily.       Objective:   Blood pressure (!) 150/90, pulse 84, temperature 98.2 F (36.8 C), temperature source Oral, resp. rate 16, height 5\' 7"  (1.702 m), weight 191 lb 3.2 oz (86.7 kg), SpO2 97 %. Body mass index is 29.95 kg/m.   Physical Exam:  Physical Exam  Constitutional: She appears well-developed.  Very talkative female.  HENT:  Head: Normocephalic and atraumatic.  Right Ear: External ear and ear canal normal.  Left Ear: External ear and ear canal normal.  Nose: Rhinorrhea present. No mucosal edema, nasal deformity or septal deviation. No epistaxis. Right sinus exhibits no maxillary sinus tenderness and no frontal sinus tenderness. Left sinus exhibits no  maxillary sinus tenderness and no frontal sinus tenderness.  Mouth/Throat: Uvula is midline and oropharynx is clear and moist. Mucous membranes are not pale and not dry.  Right tympanic membrane does have clear fluid behind it and is slightly protruding.  The left TM has a hole which has been longstanding.  Marked turbinate hypertrophy bilaterally.  Eyes: Pupils are equal, round, and reactive to light. EOM are normal. Right eye exhibits no chemosis and no discharge. Left eye exhibits no chemosis and no discharge. Right conjunctiva is injected. Left conjunctiva is injected.  Bilateral periorbital edema present.  Limbal sparing conjunctivitis bilaterally.  Cardiovascular: Normal rate, regular rhythm and normal heart sounds.  Respiratory: Effort normal and breath sounds normal. No accessory muscle usage. No tachypnea. No respiratory distress. She has no wheezes. She has no rhonchi. She has no rales. She exhibits no tenderness.  Moving air well in all lung fields.   Lymphadenopathy:    She has no cervical adenopathy.  Neurological: She is alert.  Skin: No abrasion, no petechiae and no rash noted. Rash is not papular, not vesicular and not urticarial. No erythema. No pallor.  No eczematous or urticarial lesions present.  Psychiatric: She has a normal mood and affect.     Diagnostic studies: none      Salvatore Marvel, MD  [facility]

## 2018-07-12 NOTE — Patient Instructions (Addendum)
1. Moderate persistent asthma, uncomplicated - Lung testing deferred today.  - You sounded good without wheezing today, but you can always use your rescue medication if needed for coughing or wheezing.  - Daily controller medication(s): Breo 100/25 one puff once daily + montelukast 10mg  daily - Rescue medications: albuterol 4 puffs every 4-6 hours as needed - Asthma control goals:  * Full participation in all desired activities (may need albuterol before activity) * Albuterol use two time or less a week on average (not counting use with activity) * Cough interfering with sleep two time or less a month * Oral steroids no more than once a year * No hospitalizations  2. Perennial and seasonal rhinitis (grasses, weeds, molds, dust mites) - Add on an eye drop: Zatidor one drop per eye twice daily (script will be sent in to see if your insurance/Medicare covers it). - Restart the Zyrtec (cetirizine) 10mg  daily (this is NOT Zantac, which was removed from the market) - Start the prednisone pack provided today.  - Consider allergy shots for long term control.   3. Return in about 6 months (around 01/11/2019). This can be an in-person or a virtual WebEx follow up visit.   Please inform us of any Emergency Department visits, hospitalizations, or changes in symptoms. Call us before going to the ED for breathing or allergy symptoms since we might be able to fit you in for a sick visit. Feel free to contact us anytime with any questions, problems, or concerns.  It was a pleasure to see you again today!  Websites that have reliable patient information: 1. American Academy of Asthma, Allergy, and Immunology: www.aaaai.org 2. Food Allergy Research and Education (FARE): foodallergy.org 3. Mothers of Asthmatics: http://www.asthmacommunitynetwork.org 4. American College of Allergy, Asthma, and Immunology: www.acaai.org  "Like" [place] on Facebook and Instagram for our latest updates!      Make sure you are  registered to vote! If you have moved or changed any of your contact information, you will need to get this updated before voting!    Voter ID laws are NOT going into effect for the General Election in November 2020! DO NOT let this stop you from exercising your right to vote!     Allergy Shots   Allergies are the result of a chain reaction that starts in the immune system. Your immune system controls how your body defends itself. For instance, if you have an allergy to pollen, your immune system identifies pollen as an invader or allergen. Your immune system overreacts by producing antibodies called Immunoglobulin E (IgE). These antibodies travel to cells that release chemicals, causing an allergic reaction.  The concept behind allergy immunotherapy, whether it is received in the form of shots or tablets, is that the immune system can be desensitized to specific allergens that trigger allergy symptoms. Although it requires time and patience, the payback can be long-term relief.  How Do Allergy Shots Work?  Allergy shots work much like a vaccine. Your body responds to injected amounts of a particular allergen given in increasing doses, eventually developing a resistance and tolerance to it. Allergy shots can lead to decreased, minimal or no allergy symptoms.  There generally are two phases: build-up and maintenance. Build-up often ranges from three to six months and involves receiving injections with increasing amounts of the allergens. The shots are typically given once or twice a week, though more rapid build-up schedules are sometimes used.  The maintenance phase begins when the most effective dose is reached.  This dose is different for each person, depending on how allergic you are and your response to the build-up injections. Once the maintenance dose is reached, there are longer periods between injections, typically two to four weeks.  Occasionally doctors give cortisone-type shots that  can temporarily reduce allergy symptoms. These types of shots are different and should not be confused with allergy immunotherapy shots.  Who Can Be Treated with Allergy Shots?  Allergy shots may be a good treatment approach for people with allergic rhinitis (hay fever), allergic asthma, conjunctivitis (eye allergy) or stinging insect allergy.   Before deciding to begin allergy shots, you should consider:  . The length of allergy season and the severity of your symptoms . Whether medications and/or changes to your environment can control your symptoms . Your desire to avoid long-term medication use . Time: allergy immunotherapy requires a major time commitment . Cost: may vary depending on your insurance coverage  Allergy shots for children age 102 and older are effective and often well tolerated. They might prevent the onset of new allergen sensitivities or the progression to asthma.  Allergy shots are not started on patients who are pregnant but can be continued on patients who become pregnant while receiving them. In some patients with other medical conditions or who take certain common medications, allergy shots may be of risk. It is important to mention other medications you talk to your allergist.   When Will I Feel Better?  Some may experience decreased allergy symptoms during the build-up phase. For others, it may take as long as 12 months on the maintenance dose. If there is no improvement after a year of maintenance, your allergist will discuss other treatment options with you.  If you aren't responding to allergy shots, it may be because there is not enough dose of the allergen in your vaccine or there are missing allergens that were not identified during your allergy testing. Other reasons could be that there are high levels of the allergen in your environment or major exposure to non-allergic triggers like tobacco smoke.  What Is the Length of Treatment?  Once the maintenance  dose is reached, allergy shots are generally continued for three to five years. The decision to stop should be discussed with your allergist at that time. Some people may experience a permanent reduction of allergy symptoms. Others may relapse and a longer course of allergy shots can be considered.  What Are the Possible Reactions?  The two types of adverse reactions that can occur with allergy shots are local and systemic. Common local reactions include very mild redness and swelling at the injection site, which can happen immediately or several hours after. A systemic reaction, which is less common, affects the entire body or a particular body system. They are usually mild and typically respond quickly to medications. Signs include increased allergy symptoms such as sneezing, a stuffy nose or hives.  Rarely, a serious systemic reaction called anaphylaxis can develop. Symptoms include swelling in the throat, wheezing, a feeling of tightness in the chest, nausea or dizziness. Most serious systemic reactions develop within 30 minutes of allergy shots. This is why it is strongly recommended you wait in your doctor's office for 30 minutes after your injections. Your allergist is trained to watch for reactions, and his or her staff is trained and equipped with the proper medications to identify and treat them.  Who Should Administer Allergy Shots?  The preferred location for receiving shots is your prescribing allergist's office. Injections  can sometimes be given at another facility where the physician and staff are trained to recognize and treat reactions, and have received instructions by your prescribing allergist.

## 2018-08-04 ENCOUNTER — Telehealth: Payer: Self-pay | Admitting: Family Medicine

## 2018-08-04 NOTE — Telephone Encounter (Signed)
Copied from [place] [id number]. Topic: Quick Communication - See Telephone Encounter >> Aug 04, 2018  8:01 AM Robina Ade, Helene Kelp D wrote: CRM for notification. See Telephone encounter for: 08/04/18. Patient called and would like a normal telephone call from Dr. Charlett Blake for her appointment tomorrow.

## 2018-08-05 ENCOUNTER — Other Ambulatory Visit: Payer: Self-pay

## 2018-08-05 ENCOUNTER — Ambulatory Visit (INDEPENDENT_AMBULATORY_CARE_PROVIDER_SITE_OTHER): Payer: Medicare Other | Admitting: Family Medicine

## 2018-08-05 DIAGNOSIS — J3089 Other allergic rhinitis: Secondary | ICD-10-CM

## 2018-08-05 DIAGNOSIS — J4540 Moderate persistent asthma, uncomplicated: Secondary | ICD-10-CM

## 2018-08-05 DIAGNOSIS — E782 Mixed hyperlipidemia: Secondary | ICD-10-CM | POA: Diagnosis not present

## 2018-08-05 DIAGNOSIS — E1169 Type 2 diabetes mellitus with other specified complication: Secondary | ICD-10-CM | POA: Diagnosis not present

## 2018-08-05 DIAGNOSIS — E669 Obesity, unspecified: Secondary | ICD-10-CM

## 2018-08-05 DIAGNOSIS — J302 Other seasonal allergic rhinitis: Secondary | ICD-10-CM

## 2018-08-05 NOTE — Patient Instructions (Signed)
Zinc 30-50 mg daily Vitamin C 1000 mg with Baking soda 1/2 tsp in beverage daily Deep breathing 5 each hour hold to a count of 5  Rinse nasal passages whenever outside  Zyrtec/cetirizine 10 mg once to twice daily

## 2018-08-05 NOTE — Assessment & Plan Note (Addendum)
hgba1c acceptable, minimize simple carbs. Increase exercise as tolerated. She had a home nurse check her A1C in January and it was 6.7.

## 2018-08-05 NOTE — Telephone Encounter (Signed)
LVM for patient to call back to schedule a follow up appointment in the end of June per Dr. Frederik Pear check out notes on 08/05/18 visit

## 2018-08-07 NOTE — Assessment & Plan Note (Signed)
Encouraged heart healthy diet, increase exercise, avoid trans fats, consider a krill oil cap daily 

## 2018-08-07 NOTE — Assessment & Plan Note (Signed)
No recent exacerbation, no change in meds 

## 2018-08-07 NOTE — Assessment & Plan Note (Signed)
Following with allergist and doing well. No change in meds but is encouraged to try using nasal saline after working outside. Also encouraged to get a pulse oximeter to keep at home.

## 2018-08-07 NOTE — Progress Notes (Signed)
Virtual Visit via Video Note  I connected with Vicki Dennis on 08/07/18 at  9:20 AM EDT by a video enabled telemedicine application and verified that I am speaking with the correct person using two identifiers.   I discussed the limitations of evaluation and management by telemedicine and the availability of in person appointments. The patient expressed understanding and agreed to proceed.    Subjective:    Patient ID: Vicki Dennis, female    DOB: 27-Sep-1945, 73 y.o.   MRN: [id number]  Chief Complaint  Patient presents with  . Diabetes  . Hyperlipidemia    HPI Patient is in today for follow up on chronic medical concerns including diabetes, hyperlipidemia, allergies and asthma. She is doing well and working in her garden while staying Quarantined at home. She is following with an allergist now and they have found a good combination of medications to manage her asthma and allergies. No significant flares recently. Denies CP/palp/SOB/HA/fevers/GI or GU c/o. Taking meds as prescribed  Past Medical History:  Diagnosis Date  . Allergy   . Anemia    h/o  . Asthma   . Asthma, mild intermittent, well-controlled 02/10/2014  . Blood transfusion without reported diagnosis   . Chicken pox as a child  . Diabetes mellitus    type-II, per Dr. Criss Rosales, no meds. , pt. reports weight loss has contributed  to control of diabetes.  She reports last HgbA1c- wnl.  . Diabetes mellitus 11/05/2010  . Diabetes mellitus type 2 in obese ([place]) 11/05/2010  . Diabetes mellitus type 2, uncontrolled ([place]) 11/05/2010  . Esophageal reflux 02/10/2014  . GERD (gastroesophageal reflux disease)    rare use of OTC aid  . Hyperglycemia 09/02/2015  . Hyperlipidemia   . Hypertension   . Measles as a child  . Medicare annual wellness visit, subsequent 08/06/2014  . Mumps as a child  . Obesity 02/10/2014  . Pneumonia    as a child  . Portal vein thrombosis 03/09/2011   coumadin ended summer of 2013  . Preventative  health care 08/12/2014  . Rash and nonspecific skin eruption 01/07/2015  . Seborrheic keratosis 02/10/2014   Follows with dermatology  . Sinusitis, acute 03/23/2016    Past Surgical History:  Procedure Laterality Date  . BREAST SURGERY     benign - cysts  . INSERTION OF MESH  03/16/2012   Procedure: INSERTION OF MESH;  Surgeon: Harl Bowie, MD;  Location: [facility];  Service: General;  Laterality: N/A;  . KNEE ARTHROSCOPY W/ MENISCAL REPAIR     both knees  . TONSILLECTOMY    . UMBILICAL HERNIA REPAIR  03/16/2012   Procedure: HERNIA REPAIR UMBILICAL ADULT;  Surgeon: Harl Bowie, MD;  Location: [facility];  Service: General;  Laterality: N/A;    Family History  Problem Relation Age of Onset  . Cancer Father        prostate  . Aneurysm Mother 7       brain  . Cancer Maternal Aunt     Social History   Socioeconomic History  . Marital status: Divorced    Spouse name: Not on file  . Number of children: Not on file  . Years of education: Not on file  . Highest education level: Not on file  Occupational History  . Not on file  Social Needs  . Financial resource strain: Not on file  . Food insecurity:    Worry: Not on file    Inability: Not on file  .  Transportation needs:    Medical: Not on file    Non-medical: Not on file  Tobacco Use  . Smoking status: Never Smoker  . Smokeless tobacco: Never Used  . Tobacco comment: never used tobacco  Substance and Sexual Activity  . Alcohol use: Yes    Alcohol/week: 0.0 standard drinks    Comment: for holidays  . Drug use: No  . Sexual activity: Never    Comment: lives alone   Lifestyle  . Physical activity:    Days per week: Not on file    Minutes per session: Not on file  . Stress: Not on file  Relationships  . Social connections:    Talks on phone: Not on file    Gets together: Not on file    Attends religious service: Not on file    Active member of club or organization: Not on file    Attends meetings of clubs  or organizations: Not on file    Relationship status: Not on file  . Intimate partner violence:    Fear of current or ex partner: Not on file    Emotionally abused: Not on file    Physically abused: Not on file    Forced sexual activity: Not on file  Other Topics Concern  . Not on file  Social History Narrative  . Not on file    Outpatient Medications Prior to Visit  Medication Sig Dispense Refill  . ACCU-CHEK AVIVA PLUS test strip USE AS DIRECTED ONCE A DAY TO CHECK BLOOD SUGAR 100 each 0  . albuterol (PROVENTIL HFA) 108 (90 Base) MCG/ACT inhaler Inhale 2 puffs into the lungs every 4 (four) hours as needed for wheezing or shortness of breath. 18 g 1  . aspirin 81 MG tablet Take 81 mg by mouth daily.     . enalapril (VASOTEC) 2.5 MG tablet Take 1 tablet (2.5 mg total) by mouth daily. 90 tablet 0  . EPIPEN 2-PAK 0.3 MG/0.3ML SOAJ injection Use as directed for severe allergic reactions. 2 Device 2  . fluticasone (FLONASE) 50 MCG/ACT nasal spray Place 2 sprays into both nostrils daily. 16 g 5  . fluticasone furoate-vilanterol (BREO ELLIPTA) 100-25 MCG/INH AEPB INHALE 1 PUFF BY MOUTH ONCE DAILY 28 each 5  . ketotifen (ZADITOR) 0.025 % ophthalmic solution Place 1 drop into both eyes 2 (two) times daily. (Patient taking differently: Place 1 drop into both eyes 2 (two) times daily as needed. ) 5 mL 0  . Lancets 30G MISC Use as directed once daily to check blood sugar.  Diagnosis code E11.9 100 each 5  . montelukast (SINGULAIR) 10 MG tablet Take 1 tablet (10 mg total) by mouth at bedtime. (Patient taking differently: Take 10 mg by mouth at bedtime as needed. ) 90 tablet 5  . Multiple Vitamins-Minerals (MULTIVITAMIN WITH MINERALS) tablet Take 1 tablet by mouth daily.      . Omega-3 Fatty Acids (FISH OIL CONCENTRATE) 1000 MG CAPS Take by mouth 3 (three) times daily.    Marland Kitchen azelastine (ASTELIN) 0.1 % nasal spray Place 2 sprays into both nostrils 2 (two) times daily. Use in each nostril as directed  (Patient not taking: Reported on 08/05/2018) 30 mL 6  . benzonatate (TESSALON) 100 MG capsule Take 1 capsule (100 mg total) by mouth 3 (three) times daily as needed for cough. (Patient not taking: Reported on 08/05/2018) 30 capsule 0   No facility-administered medications prior to visit.     Allergies  Allergen Reactions  . Codeine  Virtual Visit via Video Note  I connected with Vicki Dennis on 08/07/18 at  9:20 AM EDT by a video enabled telemedicine application and verified that I am speaking with the correct person using two identifiers.   I discussed the limitations of evaluation and management by telemedicine and the availability of in person appointments. The patient expressed understanding and agreed to proceed.    Subjective:    Patient ID: Vicki Dennis, female    DOB: 27-Sep-1945, 73 y.o.   MRN: [id number]  Chief Complaint  Patient presents with  . Diabetes  . Hyperlipidemia    HPI Patient is in today for follow up on chronic medical concerns including diabetes, hyperlipidemia, allergies and asthma. She is doing well and working in her garden while staying Quarantined at home. She is following with an allergist now and they have found a good combination of medications to manage her asthma and allergies. No significant flares recently. Denies CP/palp/SOB/HA/fevers/GI or GU c/o. Taking meds as prescribed  Past Medical History:  Diagnosis Date  . Allergy   . Anemia    h/o  . Asthma   . Asthma, mild intermittent, well-controlled 02/10/2014  . Blood transfusion without reported diagnosis   . Chicken pox as a child  . Diabetes mellitus    type-II, per Dr. Criss Rosales, no meds. , pt. reports weight loss has contributed  to control of diabetes.  She reports last HgbA1c- wnl.  . Diabetes mellitus 11/05/2010  . Diabetes mellitus type 2 in obese ([place]) 11/05/2010  . Diabetes mellitus type 2, uncontrolled ([place]) 11/05/2010  . Esophageal reflux 02/10/2014  . GERD (gastroesophageal reflux disease)    rare use of OTC aid  . Hyperglycemia 09/02/2015  . Hyperlipidemia   . Hypertension   . Measles as a child  . Medicare annual wellness visit, subsequent 08/06/2014  . Mumps as a child  . Obesity 02/10/2014  . Pneumonia    as a child  . Portal vein thrombosis 03/09/2011   coumadin ended summer of 2013  . Preventative  health care 08/12/2014  . Rash and nonspecific skin eruption 01/07/2015  . Seborrheic keratosis 02/10/2014   Follows with dermatology  . Sinusitis, acute 03/23/2016    Past Surgical History:  Procedure Laterality Date  . BREAST SURGERY     benign - cysts  . INSERTION OF MESH  03/16/2012   Procedure: INSERTION OF MESH;  Surgeon: Harl Bowie, MD;  Location: [facility];  Service: General;  Laterality: N/A;  . KNEE ARTHROSCOPY W/ MENISCAL REPAIR     both knees  . TONSILLECTOMY    . UMBILICAL HERNIA REPAIR  03/16/2012   Procedure: HERNIA REPAIR UMBILICAL ADULT;  Surgeon: Harl Bowie, MD;  Location: [facility];  Service: General;  Laterality: N/A;    Family History  Problem Relation Age of Onset  . Cancer Father        prostate  . Aneurysm Mother 7       brain  . Cancer Maternal Aunt     Social History   Socioeconomic History  . Marital status: Divorced    Spouse name: Not on file  . Number of children: Not on file  . Years of education: Not on file  . Highest education level: Not on file  Occupational History  . Not on file  Social Needs  . Financial resource strain: Not on file  . Food insecurity:    Worry: Not on file    Inability: Not on file  .  Virtual Visit via Video Note  I connected with Vicki Dennis on 08/07/18 at  9:20 AM EDT by a video enabled telemedicine application and verified that I am speaking with the correct person using two identifiers.   I discussed the limitations of evaluation and management by telemedicine and the availability of in person appointments. The patient expressed understanding and agreed to proceed.    Subjective:    Patient ID: Vicki Dennis, female    DOB: 27-Sep-1945, 73 y.o.   MRN: [id number]  Chief Complaint  Patient presents with  . Diabetes  . Hyperlipidemia    HPI Patient is in today for follow up on chronic medical concerns including diabetes, hyperlipidemia, allergies and asthma. She is doing well and working in her garden while staying Quarantined at home. She is following with an allergist now and they have found a good combination of medications to manage her asthma and allergies. No significant flares recently. Denies CP/palp/SOB/HA/fevers/GI or GU c/o. Taking meds as prescribed  Past Medical History:  Diagnosis Date  . Allergy   . Anemia    h/o  . Asthma   . Asthma, mild intermittent, well-controlled 02/10/2014  . Blood transfusion without reported diagnosis   . Chicken pox as a child  . Diabetes mellitus    type-II, per Dr. Criss Rosales, no meds. , pt. reports weight loss has contributed  to control of diabetes.  She reports last HgbA1c- wnl.  . Diabetes mellitus 11/05/2010  . Diabetes mellitus type 2 in obese ([place]) 11/05/2010  . Diabetes mellitus type 2, uncontrolled ([place]) 11/05/2010  . Esophageal reflux 02/10/2014  . GERD (gastroesophageal reflux disease)    rare use of OTC aid  . Hyperglycemia 09/02/2015  . Hyperlipidemia   . Hypertension   . Measles as a child  . Medicare annual wellness visit, subsequent 08/06/2014  . Mumps as a child  . Obesity 02/10/2014  . Pneumonia    as a child  . Portal vein thrombosis 03/09/2011   coumadin ended summer of 2013  . Preventative  health care 08/12/2014  . Rash and nonspecific skin eruption 01/07/2015  . Seborrheic keratosis 02/10/2014   Follows with dermatology  . Sinusitis, acute 03/23/2016    Past Surgical History:  Procedure Laterality Date  . BREAST SURGERY     benign - cysts  . INSERTION OF MESH  03/16/2012   Procedure: INSERTION OF MESH;  Surgeon: Harl Bowie, MD;  Location: [facility];  Service: General;  Laterality: N/A;  . KNEE ARTHROSCOPY W/ MENISCAL REPAIR     both knees  . TONSILLECTOMY    . UMBILICAL HERNIA REPAIR  03/16/2012   Procedure: HERNIA REPAIR UMBILICAL ADULT;  Surgeon: Harl Bowie, MD;  Location: [facility];  Service: General;  Laterality: N/A;    Family History  Problem Relation Age of Onset  . Cancer Father        prostate  . Aneurysm Mother 7       brain  . Cancer Maternal Aunt     Social History   Socioeconomic History  . Marital status: Divorced    Spouse name: Not on file  . Number of children: Not on file  . Years of education: Not on file  . Highest education level: Not on file  Occupational History  . Not on file  Social Needs  . Financial resource strain: Not on file  . Food insecurity:    Worry: Not on file    Inability: Not on file  .

## 2018-08-29 ENCOUNTER — Telehealth: Payer: Self-pay | Admitting: Family Medicine

## 2018-08-29 NOTE — Telephone Encounter (Signed)
Future orders are in. Needs lab appt at their convenience.

## 2018-08-29 NOTE — Telephone Encounter (Signed)
Copied from [facility] [id number]. Topic: Quick Communication - See Telephone Encounter >> Aug 29, 2018  9:12 AM Sheran Luz wrote: CRM for notification. See Telephone encounter for: 08/29/18.   Patient calling to inquire when she is to come in for routine blood work, as advised at last visit. Patient is requesting a call back when orders are in for scheduling.

## 2018-08-29 NOTE — Telephone Encounter (Signed)
LVM for pt to call and schedule lab appt at her convenience.

## 2018-09-06 ENCOUNTER — Other Ambulatory Visit: Payer: Self-pay | Admitting: Allergy & Immunology

## 2018-09-06 ENCOUNTER — Other Ambulatory Visit: Payer: Self-pay | Admitting: Family Medicine

## 2018-09-07 ENCOUNTER — Telehealth: Payer: Self-pay | Admitting: Allergy & Immunology

## 2018-09-07 ENCOUNTER — Telehealth: Payer: Self-pay

## 2018-09-07 DIAGNOSIS — J3089 Other allergic rhinitis: Secondary | ICD-10-CM

## 2018-09-07 DIAGNOSIS — J302 Other seasonal allergic rhinitis: Secondary | ICD-10-CM

## 2018-09-07 NOTE — Telephone Encounter (Signed)
Patient states that she was in the office and was given meds for her swelling and she took it home in a little white pouch Wondering if she can get a script sent in to [facility] [address] for that medication

## 2018-09-07 NOTE — Telephone Encounter (Signed)
Copied from [place] [id number]. Topic: General - Other >> Sep 06, 2018  9:27 AM Rainey Pines A wrote: Patient would like a callback from Dr. Rhae Lerner nurse in regards to labs that are usually ordered around this time for patient.

## 2018-09-08 MED ORDER — PREDNISONE 10 MG PO TABS: ORAL_TABLET | ORAL | 0 refills | Status: DC

## 2018-09-08 NOTE — Telephone Encounter (Signed)
That would probably be prednisone. She was having a difficult time when I saw her in March. Prednisone is not a good long term solution, as it causes lots of side effects, including increased appetite, weight gain, behavioral changes, and bone thinning. However, I will send in another short course.  I would make sure that she is taking her Zyrtec (cetirizine) 10mg  once daily (she can double up and take one tablet twice daily if she needs to to). There was some confusion at the last visit since she thought that this was Zantac, which was removed from the market. So be sure that she understands that Zyrtec is NOT Zantac.  I would also strongly encourage her to start allergy shots for more long term control. I am sure that Medicare covers it. We can send her the CPT codes just so she can make sure. She would need to two different vials. Her last testing was in May 2008, so I will order an environmental allergy panel via the blood to check if there are new sensitizations.   Salvatore Marvel, MD [facility]

## 2018-09-08 NOTE — Telephone Encounter (Signed)
Dr. Ernst Bowler I believe that she is referring to prednisone. Please advise and thank you.

## 2018-09-08 NOTE — Telephone Encounter (Signed)
Patient states that she would like to think more about the allergy injections. Information has been mailed to her home regarding information about allergy injections. Patient states that if she is interested she will call back. Lab requisition has also been mailed to the patient's home as well. Patient verbalized understanding.

## 2018-11-22 ENCOUNTER — Other Ambulatory Visit: Payer: Self-pay

## 2018-12-05 ENCOUNTER — Other Ambulatory Visit: Payer: Self-pay

## 2018-12-05 ENCOUNTER — Ambulatory Visit (INDEPENDENT_AMBULATORY_CARE_PROVIDER_SITE_OTHER): Payer: Medicare Other | Admitting: Family Medicine

## 2018-12-05 DIAGNOSIS — J4520 Mild intermittent asthma, uncomplicated: Secondary | ICD-10-CM

## 2018-12-05 DIAGNOSIS — E1169 Type 2 diabetes mellitus with other specified complication: Secondary | ICD-10-CM | POA: Diagnosis not present

## 2018-12-05 DIAGNOSIS — E782 Mixed hyperlipidemia: Secondary | ICD-10-CM

## 2018-12-05 DIAGNOSIS — E669 Obesity, unspecified: Secondary | ICD-10-CM

## 2018-12-05 MED ORDER — ENALAPRIL MALEATE 2.5 MG PO TABS: 2.5000 mg | ORAL_TABLET | Freq: Every day | ORAL | 1 refills | Status: DC

## 2018-12-05 NOTE — Assessment & Plan Note (Signed)
She is trying to stay active in her garden and is maintaining a heart healthy diet

## 2018-12-05 NOTE — Assessment & Plan Note (Signed)
No recent exacerbation 

## 2018-12-05 NOTE — Assessment & Plan Note (Signed)
Encouraged heart healthy diet, increase exercise, avoid trans fats, consider a krill oil cap daily 

## 2018-12-05 NOTE — Assessment & Plan Note (Signed)
hgba1c acceptable, minimize simple carbs. Increase exercise as tolerated. Continue current meds 

## 2018-12-05 NOTE — Progress Notes (Signed)
Virtual Visit via Video Note  I connected with Vicki Dennis on 12/05/18 at  3:40 PM EDT by a video enabled telemedicine application and verified that I am speaking with the correct person using two identifiers.  Location: Patient: home Provider: office   I discussed the limitations of evaluation and management by telemedicine and the availability of in person appointments. The patient expressed understanding and agreed to proceed. Vicki Dennis, CMA was able to get patient set up on a video visit    Subjective:    Patient ID: Vicki Dennis, female    DOB: [birth year], 73 y.o.   MRN: [id number]  No chief complaint on file.   HPI Patient is in today for for follow up on chronic medical concerns including diabetes, hyperlipidemia and more. She feels well today. She has been maintaining quarantine well. No recent febrile illness or hospitalizations. No polyuria or polydipsia. She is maintaining a heart healthy diet and staying active. Denies CP/palp/SOB/HA/congestion/fevers/GI or GU c/o. Taking meds as prescribed  Past Medical History:  Diagnosis Date  . Allergy   . Anemia    h/o  . Asthma   . Asthma, mild intermittent, well-controlled 02/10/2014  . Blood transfusion without reported diagnosis   . Chicken pox as a child  . Diabetes mellitus    type-II, per Dr. Parke Simmers, no meds. , pt. reports weight loss has contributed  to control of diabetes.  She reports last HgbA1c- wnl.  . Diabetes mellitus 11/05/2010  . Diabetes mellitus type 2 in obese (HCC) 11/05/2010  . Diabetes mellitus type 2, uncontrolled (HCC) 11/05/2010  . Esophageal reflux 02/10/2014  . GERD (gastroesophageal reflux disease)    rare use of OTC aid  . Hyperglycemia 09/02/2015  . Hyperlipidemia   . Hypertension   . Measles as a child  . Medicare annual wellness visit, subsequent 08/06/2014  . Mumps as a child  . Obesity 02/10/2014  . Pneumonia    as a child  . Portal vein thrombosis 03/09/2011   coumadin ended  summer of 2013  . Preventative health care 08/12/2014  . Rash and nonspecific skin eruption 01/07/2015  . Seborrheic keratosis 02/10/2014   Follows with dermatology  . Sinusitis, acute 03/23/2016    Past Surgical History:  Procedure Laterality Date  . BREAST SURGERY     benign - cysts  . INSERTION OF MESH  03/16/2012   Procedure: INSERTION OF MESH;  Surgeon: Shelly Rubenstein, MD;  Location: [facility];  Service: General;  Laterality: N/A;  . KNEE ARTHROSCOPY W/ MENISCAL REPAIR     both knees  . TONSILLECTOMY    . UMBILICAL HERNIA REPAIR  03/16/2012   Procedure: HERNIA REPAIR UMBILICAL ADULT;  Surgeon: Shelly Rubenstein, MD;  Location: [facility];  Service: General;  Laterality: N/A;    Family History  Problem Relation Age of Onset  . Cancer Father        prostate  . Aneurysm Mother 31       brain  . Cancer Maternal Aunt     Social History   Socioeconomic History  . Marital status: Divorced    Spouse name: Not on file  . Number of children: Not on file  . Years of education: Not on file  . Highest education level: Not on file  Occupational History  . Not on file  Social Needs  . Financial resource strain: Not on file  . Food insecurity    Worry: Not on file    Inability: Not  on file  . Transportation needs    Medical: Not on file    Non-medical: Not on file  Tobacco Use  . Smoking status: Never Smoker  . Smokeless tobacco: Never Used  . Tobacco comment: never used tobacco  Substance and Sexual Activity  . Alcohol use: Yes    Alcohol/week: 0.0 standard drinks    Comment: for holidays  . Drug use: No  . Sexual activity: Never    Comment: lives alone   Lifestyle  . Physical activity    Days per week: Not on file    Minutes per session: Not on file  . Stress: Not on file  Relationships  . Social Musician on phone: Not on file    Gets together: Not on file    Attends religious service: Not on file    Active member of club or organization: Not on file     Attends meetings of clubs or organizations: Not on file    Relationship status: Not on file  . Intimate partner violence    Fear of current or ex partner: Not on file    Emotionally abused: Not on file    Physically abused: Not on file    Forced sexual activity: Not on file  Other Topics Concern  . Not on file  Social History Narrative  . Not on file    Outpatient Medications Prior to Visit  Medication Sig Dispense Refill  . ACCU-CHEK AVIVA PLUS test strip USE TO CHECK BLOOD SUGAR ONCE A DAY AS DIRECTED 100 each 0  . albuterol (PROVENTIL HFA) 108 (90 Base) MCG/ACT inhaler Inhale 2 puffs into the lungs every 4 (four) hours as needed for wheezing or shortness of breath. 18 g 1  . aspirin 81 MG tablet Take 81 mg by mouth daily.     Marland Kitchen BREO ELLIPTA 100-25 MCG/INH AEPB INHALE 1 PUFF BY MOUTH ONCE DAILY 60 each 4  . EPIPEN 2-PAK 0.3 MG/0.3ML SOAJ injection Use as directed for severe allergic reactions. 2 Device 2  . fluticasone (FLONASE) 50 MCG/ACT nasal spray Place 2 sprays into both nostrils daily. 16 g 5  . ketotifen (ZADITOR) 0.025 % ophthalmic solution Place 1 drop into both eyes 2 (two) times daily. (Patient taking differently: Place 1 drop into both eyes 2 (two) times daily as needed. ) 5 mL 0  . Lancets 30G MISC Use as directed once daily to check blood sugar.  Diagnosis code E11.9 100 each 5  . montelukast (SINGULAIR) 10 MG tablet Take 1 tablet (10 mg total) by mouth at bedtime. (Patient taking differently: Take 10 mg by mouth at bedtime as needed. ) 90 tablet 5  . Multiple Vitamins-Minerals (MULTIVITAMIN WITH MINERALS) tablet Take 1 tablet by mouth daily.      . Omega-3 Fatty Acids (FISH OIL CONCENTRATE) 1000 MG CAPS Take by mouth 3 (three) times daily.    . predniSONE (DELTASONE) 10 MG tablet Take two tablets (20mg ) twice daily for three days, then one tablet (10mg ) twice daily for three days, then STOP. 18 tablet 0  . enalapril (VASOTEC) 2.5 MG tablet Take 1 tablet (2.5 mg total) by  mouth daily. 90 tablet 0   No facility-administered medications prior to visit.     Allergies  Allergen Reactions  . Codeine Other (See Comments)    "makes me crazy"  . Sulfa Antibiotics Anaphylaxis    "died 3 times".  . Doxycycline Other (See Comments)    "Yeast infection"  .  Oxycodone     Review of Systems  Constitutional: Negative for fever and malaise/fatigue.  HENT: Negative for congestion.   Eyes: Negative for blurred vision.  Respiratory: Negative for shortness of breath.   Cardiovascular: Negative for chest pain, palpitations and leg swelling.  Gastrointestinal: Negative for abdominal pain, blood in stool and nausea.  Genitourinary: Negative for dysuria and frequency.  Musculoskeletal: Negative for falls.  Skin: Negative for rash.  Neurological: Negative for dizziness, loss of consciousness and headaches.  Endo/Heme/Allergies: Negative for environmental allergies.  Psychiatric/Behavioral: Negative for depression. The patient is not nervous/anxious.        Objective:    Physical Exam Constitutional:      Appearance: Normal appearance. She is not ill-appearing.  HENT:     Head: Normocephalic and atraumatic.     Nose: Nose normal.  Eyes:     General:        Right eye: No discharge.        Left eye: No discharge.  Pulmonary:     Effort: Pulmonary effort is normal.  Neurological:     Mental Status: She is alert and oriented to person, place, and time.  Psychiatric:        Mood and Affect: Mood normal.        Behavior: Behavior normal.     There were no vitals taken for this visit. Wt Readings from Last 3 Encounters:  07/12/18 191 lb 3.2 oz (86.7 kg)  05/13/18 198 lb 12.8 oz (90.2 kg)  01/31/18 184 lb 12.8 oz (83.8 kg)    Diabetic Foot Exam - Simple   No data filed     Lab Results  Component Value Date   WBC 5.6 08/13/2017   HGB 13.9 08/13/2017   HCT 42.6 08/13/2017   PLT 220.0 08/13/2017   GLUCOSE 108 (H) 08/13/2017   CHOL 206 (H) 08/13/2017    TRIG 97.0 08/13/2017   HDL 72.10 08/13/2017   LDLDIRECT 111.2 02/06/2014   LDLCALC 114 (H) 08/13/2017   ALT 18 08/13/2017   AST 17 08/13/2017   NA 140 08/13/2017   K 4.2 08/13/2017   CL 105 08/13/2017   CREATININE 0.78 08/13/2017   BUN 16 08/13/2017   CO2 27 08/13/2017   TSH 0.81 08/13/2017   INR 2.2 09/21/2011   HGBA1C 6.9 (H) 08/13/2017   MICROALBUR 2.1 (H) 08/13/2017    Lab Results  Component Value Date   TSH 0.81 08/13/2017   Lab Results  Component Value Date   WBC 5.6 08/13/2017   HGB 13.9 08/13/2017   HCT 42.6 08/13/2017   MCV 89.3 08/13/2017   PLT 220.0 08/13/2017   Lab Results  Component Value Date   NA 140 08/13/2017   K 4.2 08/13/2017   CHLORIDE 110 (H) 10/10/2015   CO2 27 08/13/2017   GLUCOSE 108 (H) 08/13/2017   BUN 16 08/13/2017   CREATININE 0.78 08/13/2017   BILITOT 0.5 08/13/2017   ALKPHOS 59 08/13/2017   AST 17 08/13/2017   ALT 18 08/13/2017   PROT 6.8 08/13/2017   ALBUMIN 4.1 08/13/2017   CALCIUM 9.6 08/13/2017   ANIONGAP 8 10/10/2015   EGFR 86 (L) 10/10/2015   GFR 92.79 08/13/2017   Lab Results  Component Value Date   CHOL 206 (H) 08/13/2017   Lab Results  Component Value Date   HDL 72.10 08/13/2017   Lab Results  Component Value Date   LDLCALC 114 (H) 08/13/2017   Lab Results  Component Value Date   TRIG  97.0 08/13/2017   Lab Results  Component Value Date   CHOLHDL 3 08/13/2017   Lab Results  Component Value Date   HGBA1C 6.9 (H) 08/13/2017       Assessment & Plan:   Problem List Items Addressed This Visit    Diabetes mellitus type 2 in obese (HCC) - Primary    hgba1c acceptable, minimize simple carbs. Increase exercise as tolerated. Continue current meds      Relevant Medications   enalapril (VASOTEC) 2.5 MG tablet   Other Relevant Orders   Hemoglobin A1c   CBC   Comprehensive metabolic panel   TSH   Hyperlipidemia    Encouraged heart healthy diet, increase exercise, avoid trans fats, consider a krill  oil cap daily      Relevant Medications   enalapril (VASOTEC) 2.5 MG tablet   Other Relevant Orders   Lipid panel   Obesity    She is trying to stay active in her garden and is maintaining a heart healthy diet      Asthma, mild intermittent, well-controlled    No recent exacerbation         I am having Dois Davenport E. Roeder maintain her multivitamin with minerals, aspirin, Fish Oil Concentrate, Lancets 30G, EpiPen 2-Pak, fluticasone, montelukast, albuterol, ketotifen, Breo Ellipta, Accu-Chek Aviva Plus, predniSONE, and enalapril.  Meds ordered this encounter  Medications  . enalapril (VASOTEC) 2.5 MG tablet    Sig: Take 1 tablet (2.5 mg total) by mouth daily.    Dispense:  90 tablet    Refill:  1   I discussed the assessment and treatment plan with the patient. The patient was provided an opportunity to ask questions and all were answered. The patient agreed with the plan and demonstrated an understanding of the instructions.   The patient was advised to call back or seek an in-person evaluation if the symptoms worsen or if the condition fails to improve as anticipated.  I provided 25 minutes of non-face-to-face time during this encounter.   Danise Edge, MD

## 2018-12-07 ENCOUNTER — Telehealth: Payer: Self-pay | Admitting: Family Medicine

## 2018-12-07 NOTE — Telephone Encounter (Signed)
LM for pt to call and schedule lab appt in next 2 weeks and  f/u in 3 months

## 2018-12-16 ENCOUNTER — Other Ambulatory Visit (INDEPENDENT_AMBULATORY_CARE_PROVIDER_SITE_OTHER): Payer: Medicare Other

## 2018-12-16 ENCOUNTER — Telehealth: Payer: Self-pay | Admitting: Family Medicine

## 2018-12-16 ENCOUNTER — Other Ambulatory Visit: Payer: Self-pay

## 2018-12-16 DIAGNOSIS — E669 Obesity, unspecified: Secondary | ICD-10-CM

## 2018-12-16 DIAGNOSIS — E782 Mixed hyperlipidemia: Secondary | ICD-10-CM

## 2018-12-16 DIAGNOSIS — E1169 Type 2 diabetes mellitus with other specified complication: Secondary | ICD-10-CM | POA: Diagnosis not present

## 2018-12-16 LAB — COMPREHENSIVE METABOLIC PANEL
ALT: 14 U/L (ref 0–35)
AST: 15 U/L (ref 0–37)
Albumin: 4 g/dL (ref 3.5–5.2)
Alkaline Phosphatase: 55 U/L (ref 39–117)
BUN: 18 mg/dL (ref 6–23)
CO2: 26 mEq/L (ref 19–32)
Calcium: 9.4 mg/dL (ref 8.4–10.5)
Chloride: 106 mEq/L (ref 96–112)
Creatinine, Ser: 0.74 mg/dL (ref 0.40–1.20)
GFR: 92.44 mL/min (ref >60.00)
Glucose, Bld: 112 mg/dL — ABNORMAL HIGH (ref 70–99)
Potassium: 4.1 mEq/L (ref 3.5–5.1)
Sodium: 141 mEq/L (ref 135–145)
Total Bilirubin: 0.6 mg/dL (ref 0.2–1.2)
Total Protein: 6.1 g/dL (ref 6.0–8.3)

## 2018-12-16 LAB — HEMOGLOBIN A1C: Hgb A1c MFr Bld: 6.9 % — ABNORMAL HIGH (ref 4.6–6.5)

## 2018-12-16 LAB — LIPID PANEL
Cholesterol: 191 mg/dL (ref 0–200)
HDL: 59.3 mg/dL (ref >39.00)
LDL Cholesterol: 112 mg/dL — ABNORMAL HIGH (ref 0–99)
NonHDL: 132.12
Total CHOL/HDL Ratio: 3
Triglycerides: 101 mg/dL (ref 0.0–149.0)
VLDL: 20.2 mg/dL (ref 0.0–40.0)

## 2018-12-16 LAB — CBC
HCT: 41.1 % (ref 36.0–46.0)
Hemoglobin: 13.5 g/dL (ref 12.0–15.0)
MCHC: 32.9 g/dL (ref 30.0–36.0)
MCV: 89.2 fl (ref 78.0–100.0)
Platelets: 192 10*3/uL (ref 150.0–400.0)
RBC: 4.61 Mil/uL (ref 3.87–5.11)
RDW: 13.8 % (ref 11.5–15.5)
WBC: 5.7 10*3/uL (ref 4.0–10.5)

## 2018-12-16 LAB — TSH: TSH: 1.44 u[IU]/mL (ref 0.35–4.50)

## 2018-12-16 NOTE — Telephone Encounter (Signed)
Patient came into the office to get lab work done. Patient also wanted to give [place] her vitals to document.  BP: 122/72 Pulse: 81 O2: 98% Temp: 97.6

## 2018-12-31 ENCOUNTER — Other Ambulatory Visit: Payer: Self-pay | Admitting: Family Medicine

## 2019-01-09 ENCOUNTER — Other Ambulatory Visit: Payer: Self-pay

## 2019-01-09 DIAGNOSIS — Z20822 Contact with and (suspected) exposure to covid-19: Secondary | ICD-10-CM

## 2019-01-10 LAB — NOVEL CORONAVIRUS, NAA: SARS-CoV-2, NAA: NOT DETECTED

## 2019-03-13 ENCOUNTER — Ambulatory Visit: Payer: Medicare Other | Admitting: Family Medicine

## 2019-03-21 ENCOUNTER — Other Ambulatory Visit: Payer: Self-pay | Admitting: Family Medicine

## 2019-04-18 ENCOUNTER — Telehealth: Payer: Self-pay

## 2019-04-18 NOTE — Telephone Encounter (Signed)
Copied from [place] [id number]. Topic: General - Inquiry >> Apr 17, 2019  1:33 PM Alease Frame wrote: Reason for CRM: Patient is returning call from office. No notes in chart . Please advise

## 2019-04-18 NOTE — Telephone Encounter (Signed)
I seen no notes I did not call patient

## 2019-04-19 ENCOUNTER — Other Ambulatory Visit: Payer: Self-pay

## 2019-04-20 ENCOUNTER — Ambulatory Visit: Payer: Medicare PPO | Admitting: Family Medicine

## 2019-04-20 ENCOUNTER — Other Ambulatory Visit: Payer: Self-pay | Admitting: Allergy & Immunology

## 2019-04-20 ENCOUNTER — Encounter: Payer: Self-pay | Admitting: Family Medicine

## 2019-04-20 DIAGNOSIS — E782 Mixed hyperlipidemia: Secondary | ICD-10-CM | POA: Diagnosis not present

## 2019-04-20 DIAGNOSIS — J4520 Mild intermittent asthma, uncomplicated: Secondary | ICD-10-CM | POA: Diagnosis not present

## 2019-04-20 DIAGNOSIS — E669 Obesity, unspecified: Secondary | ICD-10-CM | POA: Diagnosis not present

## 2019-04-20 DIAGNOSIS — E1169 Type 2 diabetes mellitus with other specified complication: Secondary | ICD-10-CM

## 2019-04-20 LAB — COMPREHENSIVE METABOLIC PANEL
ALT: 11 U/L (ref 0–35)
AST: 12 U/L (ref 0–37)
Albumin: 4.1 g/dL (ref 3.5–5.2)
Alkaline Phosphatase: 60 U/L (ref 39–117)
BUN: 17 mg/dL (ref 6–23)
CO2: 26 mEq/L (ref 19–32)
Calcium: 9.4 mg/dL (ref 8.4–10.5)
Chloride: 105 mEq/L (ref 96–112)
Creatinine, Ser: 0.69 mg/dL (ref 0.40–1.20)
GFR: 100.12 mL/min (ref >60.00)
Glucose, Bld: 120 mg/dL — ABNORMAL HIGH (ref 70–99)
Potassium: 4.4 mEq/L (ref 3.5–5.1)
Sodium: 138 mEq/L (ref 135–145)
Total Bilirubin: 0.5 mg/dL (ref 0.2–1.2)
Total Protein: 6.2 g/dL (ref 6.0–8.3)

## 2019-04-20 LAB — CBC
HCT: 42.7 % (ref 36.0–46.0)
Hemoglobin: 14 g/dL (ref 12.0–15.0)
MCHC: 32.7 g/dL (ref 30.0–36.0)
MCV: 90.3 fl (ref 78.0–100.0)
Platelets: 188 10*3/uL (ref 150.0–400.0)
RBC: 4.73 Mil/uL (ref 3.87–5.11)
RDW: 13.8 % (ref 11.5–15.5)
WBC: 5.9 10*3/uL (ref 4.0–10.5)

## 2019-04-20 LAB — LIPID PANEL
Cholesterol: 258 mg/dL — ABNORMAL HIGH (ref 0–200)
HDL: 66.7 mg/dL (ref >39.00)
LDL Cholesterol: 168 mg/dL — ABNORMAL HIGH (ref 0–99)
NonHDL: 191.75
Total CHOL/HDL Ratio: 4
Triglycerides: 120 mg/dL (ref 0.0–149.0)
VLDL: 24 mg/dL (ref 0.0–40.0)

## 2019-04-20 LAB — HEMOGLOBIN A1C: Hgb A1c MFr Bld: 6.8 % — ABNORMAL HIGH (ref 4.6–6.5)

## 2019-04-20 LAB — TSH: TSH: 1.06 u[IU]/mL (ref 0.35–4.50)

## 2019-04-20 MED ORDER — AMOXICILLIN 500 MG PO CAPS: 500.0000 mg | ORAL_CAPSULE | Freq: Three times a day (TID) | ORAL | 0 refills | Status: DC

## 2019-04-20 NOTE — Assessment & Plan Note (Addendum)
encouraged heart healthy diet, avoid trans fats, minimize simple carbs and saturated fats. Increase exercise as tolerated 

## 2019-04-20 NOTE — Progress Notes (Signed)
Subjective:    Patient ID: Vicki Dennis, female    DOB: Aug 26, 1945, 74 y.o.   MRN: [id number]  Chief Complaint  Patient presents with  . Follow-up    HPI Patient is in today for follow up on chronic medical concerns including diabetes, hyperlipidemia and more. She is noting 7-10 days of increased chest congestion and tightness, SOB in the chest. No fevers or chills. No myalgias, headache or head congestion. Is maintaining quarantine well. Eating well and staying active at home. Denies CP/palp/SOB/HA/fevers/GI or GU c/o. Taking meds as prescribed  Past Medical History:  Diagnosis Date  . Allergy   . Anemia    h/o  . Asthma   . Asthma, mild intermittent, well-controlled 02/10/2014  . Blood transfusion without reported diagnosis   . Chicken pox as a child  . Diabetes mellitus    type-II, per Dr. Parke Simmers, no meds. , pt. reports weight loss has contributed  to control of diabetes.  She reports last HgbA1c- wnl.  . Diabetes mellitus 11/05/2010  . Diabetes mellitus type 2 in obese (HCC) 11/05/2010  . Diabetes mellitus type 2, uncontrolled (HCC) 11/05/2010  . Esophageal reflux 02/10/2014  . GERD (gastroesophageal reflux disease)    rare use of OTC aid  . Hyperglycemia 09/02/2015  . Hyperlipidemia   . Hypertension   . Measles as a child  . Medicare annual wellness visit, subsequent 08/06/2014  . Mumps as a child  . Obesity 02/10/2014  . Pneumonia    as a child  . Portal vein thrombosis 03/09/2011   coumadin ended summer of 2013  . Preventative health care 08/12/2014  . Rash and nonspecific skin eruption 01/07/2015  . Seborrheic keratosis 02/10/2014   Follows with dermatology  . Sinusitis, acute 03/23/2016    Past Surgical History:  Procedure Laterality Date  . BREAST SURGERY     benign - cysts  . INSERTION OF MESH  03/16/2012   Procedure: INSERTION OF MESH;  Surgeon: Shelly Rubenstein, MD;  Location: [facility];  Service: General;  Laterality: N/A;  . KNEE ARTHROSCOPY W/ MENISCAL  REPAIR     both knees  . TONSILLECTOMY    . UMBILICAL HERNIA REPAIR  03/16/2012   Procedure: HERNIA REPAIR UMBILICAL ADULT;  Surgeon: Shelly Rubenstein, MD;  Location: [facility];  Service: General;  Laterality: N/A;    Family History  Problem Relation Age of Onset  . Cancer Father        prostate  . Aneurysm Mother 79       brain  . Cancer Maternal Aunt     Social History   Socioeconomic History  . Marital status: Divorced    Spouse name: Not on file  . Number of children: Not on file  . Years of education: Not on file  . Highest education level: Not on file  Occupational History  . Not on file  Tobacco Use  . Smoking status: Never Smoker  . Smokeless tobacco: Never Used  . Tobacco comment: never used tobacco  Substance and Sexual Activity  . Alcohol use: Yes    Alcohol/week: 0.0 standard drinks    Comment: for holidays  . Drug use: No  . Sexual activity: Never    Comment: lives alone   Other Topics Concern  . Not on file  Social History Narrative  . Not on file   Social Determinants of Health   Financial Resource Strain:   . Difficulty of Paying Living Expenses: Not on file  Food  Insecurity:   . Worried About Programme researcher, broadcasting/film/video in the Last Year: Not on file  . Ran Out of Food in the Last Year: Not on file  Transportation Needs:   . Lack of Transportation (Medical): Not on file  . Lack of Transportation (Non-Medical): Not on file  Physical Activity:   . Days of Exercise per Week: Not on file  . Minutes of Exercise per Session: Not on file  Stress:   . Feeling of Stress : Not on file  Social Connections:   . Frequency of Communication with Friends and Family: Not on file  . Frequency of Social Gatherings with Friends and Family: Not on file  . Attends Religious Services: Not on file  . Active Member of Clubs or Organizations: Not on file  . Attends Banker Meetings: Not on file  . Marital Status: Not on file  Intimate Partner Violence:   .  Fear of Current or Ex-Partner: Not on file  . Emotionally Abused: Not on file  . Physically Abused: Not on file  . Sexually Abused: Not on file    Outpatient Medications Prior to Visit  Medication Sig Dispense Refill  . ACCU-CHEK AVIVA PLUS test strip use to check blood sugar once a day as directed 100 each 0  . albuterol (PROVENTIL HFA) 108 (90 Base) MCG/ACT inhaler Inhale 2 puffs into the lungs every 4 (four) hours as needed for wheezing or shortness of breath. 18 g 1  . aspirin 81 MG tablet Take 81 mg by mouth daily.     Marland Kitchen BREO ELLIPTA 100-25 MCG/INH AEPB INHALE 1 PUFF BY MOUTH ONCE DAILY 60 each 4  . enalapril (VASOTEC) 2.5 MG tablet Take 1 tablet (2.5 mg total) by mouth daily. 90 tablet 1  . EPIPEN 2-PAK 0.3 MG/0.3ML SOAJ injection Use as directed for severe allergic reactions. 2 Device 2  . fluticasone (FLONASE) 50 MCG/ACT nasal spray Place 2 sprays into both nostrils daily. 16 g 5  . ketotifen (ZADITOR) 0.025 % ophthalmic solution Place 1 drop into both eyes 2 (two) times daily. (Patient taking differently: Place 1 drop into both eyes 2 (two) times daily as needed. ) 5 mL 0  . Lancets 30G MISC Use as directed once daily to check blood sugar.  Diagnosis code E11.9 100 each 5  . montelukast (SINGULAIR) 10 MG tablet Take 1 tablet (10 mg total) by mouth at bedtime. (Patient taking differently: Take 10 mg by mouth at bedtime as needed. ) 90 tablet 5  . Multiple Vitamins-Minerals (MULTIVITAMIN WITH MINERALS) tablet Take 1 tablet by mouth daily.      . Omega-3 Fatty Acids (FISH OIL CONCENTRATE) 1000 MG CAPS Take by mouth 3 (three) times daily.    . predniSONE (DELTASONE) 10 MG tablet Take two tablets (20mg ) twice daily for three days, then one tablet (10mg ) twice daily for three days, then STOP. 18 tablet 0   No facility-administered medications prior to visit.    Allergies  Allergen Reactions  . Codeine Other (See Comments)    "makes me crazy"  . Sulfa Antibiotics Anaphylaxis    "died  3 times".  . Doxycycline Other (See Comments)    "Yeast infection"  . Oxycodone     Review of Systems  Constitutional: Negative for fever and malaise/fatigue.  HENT: Negative for congestion.   Eyes: Negative for blurred vision.  Respiratory: Positive for shortness of breath.   Cardiovascular: Negative for chest pain, palpitations and leg swelling.  Gastrointestinal: Negative  Subjective:    Patient ID: Vicki Dennis, female    DOB: 1946-01-22, 74 y.o.   MRN: [id number]  Chief Complaint  Patient presents with  . Follow-up    HPI Patient is in today for follow up on chronic medical concerns including diabetes, hyperlipidemia and more. She is noting 7-10 days of increased chest congestion and tightness, SOB in the chest. No fevers or chills. No myalgias, headache or head congestion. Is maintaining quarantine well. Eating well and staying active at home. Denies CP/palp/SOB/HA/fevers/GI or GU c/o. Taking meds as prescribed  Past Medical History:  Diagnosis Date  . Allergy   . Anemia    h/o  . Asthma   . Asthma, mild intermittent, well-controlled 02/10/2014  . Blood transfusion without reported diagnosis   . Chicken pox as a child  . Diabetes mellitus    type-II, per Dr. Criss Rosales, no meds. , pt. reports weight loss has contributed  to control of diabetes.  She reports last HgbA1c- wnl.  . Diabetes mellitus 11/05/2010  . Diabetes mellitus type 2 in obese ([facility]) 11/05/2010  . Diabetes mellitus type 2, uncontrolled ([place]) 11/05/2010  . Esophageal reflux 02/10/2014  . GERD (gastroesophageal reflux disease)    rare use of OTC aid  . Hyperglycemia 09/02/2015  . Hyperlipidemia   . Hypertension   . Measles as a child  . Medicare annual wellness visit, subsequent 08/06/2014  . Mumps as a child  . Obesity 02/10/2014  . Pneumonia    as a child  . Portal vein thrombosis 03/09/2011   coumadin ended summer of 2013  . Preventative health care 08/12/2014  . Rash and nonspecific skin eruption 01/07/2015  . Seborrheic keratosis 02/10/2014   Follows with dermatology  . Sinusitis, acute 03/23/2016    Past Surgical History:  Procedure Laterality Date  . BREAST SURGERY     benign - cysts  . INSERTION OF MESH  03/16/2012   Procedure: INSERTION OF MESH;  Surgeon: Harl Bowie, MD;  Location: [facility];  Service: General;  Laterality: N/A;  . KNEE ARTHROSCOPY W/ MENISCAL  REPAIR     both knees  . TONSILLECTOMY    . UMBILICAL HERNIA REPAIR  03/16/2012   Procedure: HERNIA REPAIR UMBILICAL ADULT;  Surgeon: Harl Bowie, MD;  Location: [facility];  Service: General;  Laterality: N/A;    Family History  Problem Relation Age of Onset  . Cancer Father        prostate  . Aneurysm Mother 89       brain  . Cancer Maternal Aunt     Social History   Socioeconomic History  . Marital status: Divorced    Spouse name: Not on file  . Number of children: Not on file  . Years of education: Not on file  . Highest education level: Not on file  Occupational History  . Not on file  Tobacco Use  . Smoking status: Never Smoker  . Smokeless tobacco: Never Used  . Tobacco comment: never used tobacco  Substance and Sexual Activity  . Alcohol use: Yes    Alcohol/week: 0.0 standard drinks    Comment: for holidays  . Drug use: No  . Sexual activity: Never    Comment: lives alone   Other Topics Concern  . Not on file  Social History Narrative  . Not on file   Social Determinants of Health   Financial Resource Strain:   . Difficulty of Paying Living Expenses: Not on file  Food  Subjective:    Patient ID: Vicki Dennis, female    DOB: 1946-01-22, 74 y.o.   MRN: [id number]  Chief Complaint  Patient presents with  . Follow-up    HPI Patient is in today for follow up on chronic medical concerns including diabetes, hyperlipidemia and more. She is noting 7-10 days of increased chest congestion and tightness, SOB in the chest. No fevers or chills. No myalgias, headache or head congestion. Is maintaining quarantine well. Eating well and staying active at home. Denies CP/palp/SOB/HA/fevers/GI or GU c/o. Taking meds as prescribed  Past Medical History:  Diagnosis Date  . Allergy   . Anemia    h/o  . Asthma   . Asthma, mild intermittent, well-controlled 02/10/2014  . Blood transfusion without reported diagnosis   . Chicken pox as a child  . Diabetes mellitus    type-II, per Dr. Criss Rosales, no meds. , pt. reports weight loss has contributed  to control of diabetes.  She reports last HgbA1c- wnl.  . Diabetes mellitus 11/05/2010  . Diabetes mellitus type 2 in obese ([facility]) 11/05/2010  . Diabetes mellitus type 2, uncontrolled ([facility]) 11/05/2010  . Esophageal reflux 02/10/2014  . GERD (gastroesophageal reflux disease)    rare use of OTC aid  . Hyperglycemia 09/02/2015  . Hyperlipidemia   . Hypertension   . Measles as a child  . Medicare annual wellness visit, subsequent 08/06/2014  . Mumps as a child  . Obesity 02/10/2014  . Pneumonia    as a child  . Portal vein thrombosis 03/09/2011   coumadin ended summer of 2013  . Preventative health care 08/12/2014  . Rash and nonspecific skin eruption 01/07/2015  . Seborrheic keratosis 02/10/2014   Follows with dermatology  . Sinusitis, acute 03/23/2016    Past Surgical History:  Procedure Laterality Date  . BREAST SURGERY     benign - cysts  . INSERTION OF MESH  03/16/2012   Procedure: INSERTION OF MESH;  Surgeon: Harl Bowie, MD;  Location: [facility];  Service: General;  Laterality: N/A;  . KNEE ARTHROSCOPY W/ MENISCAL  REPAIR     both knees  . TONSILLECTOMY    . UMBILICAL HERNIA REPAIR  03/16/2012   Procedure: HERNIA REPAIR UMBILICAL ADULT;  Surgeon: Harl Bowie, MD;  Location: [facility];  Service: General;  Laterality: N/A;    Family History  Problem Relation Age of Onset  . Cancer Father        prostate  . Aneurysm Mother 89       brain  . Cancer Maternal Aunt     Social History   Socioeconomic History  . Marital status: Divorced    Spouse name: Not on file  . Number of children: Not on file  . Years of education: Not on file  . Highest education level: Not on file  Occupational History  . Not on file  Tobacco Use  . Smoking status: Never Smoker  . Smokeless tobacco: Never Used  . Tobacco comment: never used tobacco  Substance and Sexual Activity  . Alcohol use: Yes    Alcohol/week: 0.0 standard drinks    Comment: for holidays  . Drug use: No  . Sexual activity: Never    Comment: lives alone   Other Topics Concern  . Not on file  Social History Narrative  . Not on file   Social Determinants of Health   Financial Resource Strain:   . Difficulty of Paying Living Expenses: Not on file  Food

## 2019-04-20 NOTE — Patient Instructions (Addendum)
Multivitamin with minerals daily Vitamin D 2000 IU daily EC Aspirin 81 mg daily   Probiotic daily  Melatonin 3-6 mg at bedtime  If sick   Vitamin C 1000 mg twice daily Zinc 30-50 mg daily Famotidine/Pepcid 20 mg twice day  Check oxygen twice daily and as needed and want numbers to stay in the 90s  Take the antibiotic if fevers develop or symptoms worsen Upper Respiratory Infection, Adult An upper respiratory infection (URI) affects the nose, throat, and upper air passages. URIs are caused by germs (viruses). The most common type of URI is often called "the common cold." Medicines cannot cure URIs, but you can do things at home to relieve your symptoms. URIs usually get better within 7-10 days. Follow these instructions at home: Activity  Rest as needed.  If you have a fever, stay home from work or school until your fever is gone, or until your doctor says you may return to work or school. ? You should stay home until you cannot spread the infection anymore (you are not contagious). ? Your doctor may have you wear a face mask so you have less risk of spreading the infection. Relieving symptoms  Gargle with a salt-water mixture 3-4 times a day or as needed. To make a salt-water mixture, completely dissolve -1 tsp of salt in 1 cup of warm water.  Use a cool-mist humidifier to add moisture to the air. This can help you breathe more easily. Eating and drinking   Drink enough fluid to keep your pee (urine) pale yellow.  Eat soups and other clear broths. General instructions   Take over-the-counter and prescription medicines only as told by your doctor. These include cold medicines, fever reducers, and cough suppressants.  Do not use any products that contain nicotine or tobacco. These include cigarettes and e-cigarettes. If you need help quitting, ask your doctor.  Avoid being where people are smoking (avoid secondhand smoke).  Make sure you get regular shots and get the  flu shot every year.  Keep all follow-up visits as told by your doctor. This is important. How to avoid spreading infection to others   Wash your hands often with soap and water. If you do not have soap and water, use hand sanitizer.  Avoid touching your mouth, face, eyes, or nose.  Cough or sneeze into a tissue or your sleeve or elbow. Do not cough or sneeze into your hand or into the air. Contact a doctor if:  You are getting worse, not better.  You have any of these: ? A fever. ? Chills. ? Brown or red mucus in your nose. ? Yellow or brown fluid (discharge)coming from your nose. ? Pain in your face, especially when you bend forward. ? Swollen neck glands. ? Pain with swallowing. ? White areas in the back of your throat. Get help right away if:  You have shortness of breath that gets worse.  You have very bad or constant: ? Headache. ? Ear pain. ? Pain in your forehead, behind your eyes, and over your cheekbones (sinus pain). ? Chest pain.  You have long-lasting (chronic) lung disease along with any of these: ? Wheezing. ? Long-lasting cough. ? Coughing up blood. ? A change in your usual mucus.  You have a stiff neck.  You have changes in your: ? Vision. ? Hearing. ? Thinking. ? Mood. Summary  An upper respiratory infection (URI) is caused by a germ called a virus. The most common type of URI is often  called "the common cold."  URIs usually get better within 7-10 days.  Take over-the-counter and prescription medicines only as told by your doctor. This information is not intended to replace advice given to you by your health care provider. Make sure you discuss any questions you have with your health care provider. Document Revised: 04/07/2018 Document Reviewed: 11/20/2016 Elsevier Patient Education  [place].  Probiotic daily  Melatonin 3-6 mg at bedtime  If sick   Vitamin C 1000 mg twice daily Zinc 30-50 mg daily Famotidine/Pepcid 20 mg  twice day  Check oxygen twice daily and as needed and want numbers to stay in the 90s  Take the antibiotic if fevers develop or symptoms worsen

## 2019-04-20 NOTE — Assessment & Plan Note (Addendum)
She is struggling with an exacerbation with some cough and chest congestion productive of yellow phlegm, but fevers, chills, myalgias, headache or head congestion. Has been sick going on 10 days

## 2019-04-20 NOTE — Assessment & Plan Note (Signed)
hgba1c acceptable, minimize simple carbs. Increase exercise as tolerated. Continue current meds 

## 2019-04-20 NOTE — Assessment & Plan Note (Signed)
She is eating well during quarantine and stays active at home.

## 2019-04-21 NOTE — Addendum Note (Signed)
Addended by: Magdalene Molly A on: 04/21/2019 03:42 PM   Modules accepted: Orders

## 2019-04-28 ENCOUNTER — Other Ambulatory Visit: Payer: Self-pay | Admitting: Allergy & Immunology

## 2019-05-09 ENCOUNTER — Other Ambulatory Visit: Payer: Self-pay

## 2019-05-09 ENCOUNTER — Ambulatory Visit (INDEPENDENT_AMBULATORY_CARE_PROVIDER_SITE_OTHER): Payer: Medicare PPO | Admitting: Allergy & Immunology

## 2019-05-09 ENCOUNTER — Encounter: Payer: Self-pay | Admitting: Allergy & Immunology

## 2019-05-09 VITALS — BP 138/86 | HR 84 | Temp 97.8°F | Resp 16 | Ht 65.5 in | Wt 192.8 lb

## 2019-05-09 DIAGNOSIS — J302 Other seasonal allergic rhinitis: Secondary | ICD-10-CM | POA: Diagnosis not present

## 2019-05-09 DIAGNOSIS — J4540 Moderate persistent asthma, uncomplicated: Secondary | ICD-10-CM

## 2019-05-09 DIAGNOSIS — J3089 Other allergic rhinitis: Secondary | ICD-10-CM

## 2019-05-09 MED ORDER — ALBUTEROL SULFATE HFA 108 (90 BASE) MCG/ACT IN AERS: 2.0000 | INHALATION_SPRAY | RESPIRATORY_TRACT | 1 refills | Status: DC | PRN

## 2019-05-09 MED ORDER — MONTELUKAST SODIUM 10 MG PO TABS: 10.0000 mg | ORAL_TABLET | Freq: Every day | ORAL | 5 refills | Status: DC

## 2019-05-09 MED ORDER — BUDESONIDE-FORMOTEROL FUMARATE 160-4.5 MCG/ACT IN AERO: 2.0000 | INHALATION_SPRAY | Freq: Two times a day (BID) | RESPIRATORY_TRACT | 5 refills | Status: DC

## 2019-05-09 MED ORDER — FLUTICASONE PROPIONATE 50 MCG/ACT NA SUSP: 2.0000 | Freq: Every day | NASAL | 5 refills | Status: DC

## 2019-05-09 NOTE — Progress Notes (Signed)
FOLLOW UP  Date of Service/Encounter:  05/09/19   Assessment:   Moderate persistent asthma without complication  Seasonal and perennial allergic rhinitis(grasses, weeds, molds, dust mites)  Gastroesophageal reflux disease   Asthma Reportables: Severity:moderate persistent Risk:low Control:well controlled    Plan/Recommendations:   1. Moderate persistent asthma, uncomplicated - Lung testing looked good today.  - We will send in some Symbicort two puffs twice daily.  - You can use up the [facility] that you have first. - Daily controller medication(s): Breo 100/25 one puff once daily (then changed to Symbicort) + montelukast 10mg  daily - Rescue medications: albuterol 4 puffs every 4-6 hours as needed - Asthma control goals:  * Full participation in all desired activities (may need albuterol before activity) * Albuterol use two time or less a week on average (not counting use with activity) * Cough interfering with sleep two time or less a month * Oral steroids no more than once a year * No hospitalizations  2. Perennial and seasonal rhinitis (grasses, weeds, molds, dust mites) - Continue with your nose spray 1-2 times daily.  - It seems that you have a good handle on these symptoms.  - Consider allergy shots for long term control.   3. Return in about 6 months (around 11/06/2019). This can be an in-person, a virtual Webex or a telephone follow up visit.   Subjective:   Vicki Dennis is a 74 y.o. female presenting today for follow up of  Chief Complaint  Patient presents with  . Asthma  . Other    sometimes vomits durning the evening, has acid reflux, no medication    Vicki Dennis has a history of the following: Patient Active Problem List   Diagnosis Date Noted  . Hiatal hernia 01/31/2018  . Hemangioma of liver 01/31/2018  . Renal cyst 01/31/2018  . Seasonal and perennial allergic rhinitis 06/17/2017  . Moderate persistent asthma without  complication 0000000  . Abdominal mass 04/09/2017  . Sinusitis 03/23/2016  . Follicular acne [id number]  . Preventative health care 08/12/2014  . Medicare annual wellness visit, subsequent 08/06/2014  . Obesity 02/10/2014  . History of colonic polyps 02/10/2014  . Gastroesophageal reflux disease 02/10/2014  . Seborrheic keratosis 02/10/2014  . Asthma, mild intermittent, well-controlled 02/10/2014  . History of chicken pox   . Measles   . Mumps   . Allergy   . Umbilical hernia [id number]  . Portal vein thrombosis 03/09/2011  . Diabetes mellitus type 2 in obese ([place]) 11/05/2010  . Hyperlipidemia 11/05/2010    History obtained from: chart review and patient.  Vicki Dennis is a 74 y.o. female presenting for a follow up visit.  We last saw her in March 2020.  At that time, we deferred lung testing.  We continued her on Breo 100/25 mcg 1 puff once daily with montelukast 10 mg daily.  We also continued albuterol 4 puffs every 4-6 hours as needed.  For the allergic rhinitis, we added on Zaditor 1 drop per eye twice daily.  We recommended restarting the Zyrtec and started her on a prednisone pack.  We also discussed allergy shots as a means of long-term control.  Since last visit, she has mostly done well.  She has been very safe during the Covid pandemic.  In September, she had a close call when she had a coworker that was positive.  Thankfully, she was negative.  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  Some of that she can do remotely and some of it is done in person.  She is also very excited that she got her first Moderna vaccine around 10 days ago.  She is already scheduled to get her second 1 in a few weeks.  Asthma/Respiratory Symptom History: She remains on the Breo 1 puff once daily.  She is interested in changing to Symbicort because she seems to think that it works better.  I  think we changed to [facility] because her insurance covered it better.  She would like to change back to Symbicort even if it means a higher co-pay.  She is also on the montelukast 10 mg daily.  She has not required any prednisone or emergency room visits for her breathing.  Allergic Rhinitis Symptom History: For her rhinitis, she has done well with nasal saline rinses.  She seems to have a good handle on her symptoms compared to the last time I saw her.  She has not needed prednisone since I saw her around a year ago.  She has not needed antibiotics.  We have discussed allergen immunotherapy but she was not interested in this.  Otherwise, there have been no changes to her past medical history, surgical history, family history, or social history.    Review of Systems  Constitutional: Negative.  Negative for chills, fever, malaise/fatigue and weight loss.  HENT: Negative.  Negative for congestion, ear discharge, ear pain, sinus pain and sore throat.   Eyes: Negative for pain, discharge and redness.  Respiratory: Negative for cough, sputum production, shortness of breath and wheezing.   Cardiovascular: Negative.  Negative for chest pain and palpitations.  Gastrointestinal: Negative for abdominal pain, constipation, diarrhea, heartburn, nausea and vomiting.  Skin: Negative.  Negative for itching and rash.  Neurological: Negative for dizziness and headaches.  Endo/Heme/Allergies: Negative for environmental allergies. Does not bruise/bleed easily.       Objective:   Blood pressure 138/86, pulse 84, temperature 97.8 F (36.6 C), temperature source Temporal, resp. rate 16, height 5' 5.5" (1.664 m), weight 192 lb 12.8 oz (87.5 kg), SpO2 96 %. Body mass index is 31.6 kg/m.   Physical Exam:  Physical Exam  Constitutional: She appears well-developed.  Very pleasant and bubbly.  Always a joy.  HENT:  Head: Normocephalic and atraumatic.  Right Ear: Tympanic membrane, external ear and ear canal  normal.  Left Ear: Tympanic membrane and ear canal normal.  Nose: No mucosal edema, rhinorrhea, nasal deformity or septal deviation. No epistaxis. Right sinus exhibits no maxillary sinus tenderness and no frontal sinus tenderness. Left sinus exhibits no maxillary sinus tenderness and no frontal sinus tenderness.  Mouth/Throat: Uvula is midline and oropharynx is clear and moist. Mucous membranes are not pale and not dry.  Eyes: Pupils are equal, round, and reactive to light. Conjunctivae and EOM are normal. Right eye exhibits no chemosis and no discharge. Left eye exhibits no chemosis and no discharge. Right conjunctiva is not injected. Left conjunctiva is not injected.  Cardiovascular: Normal rate, regular rhythm and normal heart sounds.  Respiratory: Effort normal and breath sounds normal. No accessory muscle usage. No tachypnea. No respiratory distress. She has no wheezes. She has no rhonchi. She has no rales. She exhibits no tenderness.  Moving air well in all lung fields.  Lymphadenopathy:    She has no cervical adenopathy.  Neurological: She is alert.  Skin: No abrasion, no petechiae and no rash noted.  Rash is not papular, not vesicular and not urticarial. No erythema. No pallor.  No eczematous or urticarial lesions noted.  Psychiatric: She has a normal mood and affect.     Diagnostic studies:    Spirometry: results normal (FEV1: 2.39/126%, FVC: 2.98/122%, FEV1/FVC: 80%).    Spirometry consistent with normal pattern.   Allergy Studies: none        Salvatore Marvel, MD  [facility]

## 2019-05-09 NOTE — Patient Instructions (Addendum)
1. Moderate persistent asthma, uncomplicated - Lung testing looked good today.  - We will send in some Symbicort two puffs twice daily.  - You can use up the [facility] that you have first. - Daily controller medication(s): Breo 100/25 one puff once daily (then changed to Symbicort) + montelukast 10mg  daily - Rescue medications: albuterol 4 puffs every 4-6 hours as needed - Asthma control goals:  * Full participation in all desired activities (may need albuterol before activity) * Albuterol use two time or less a week on average (not counting use with activity) * Cough interfering with sleep two time or less a month * Oral steroids no more than once a year * No hospitalizations  2. Perennial and seasonal rhinitis (grasses, weeds, molds, dust mites) - Continue with your nose spray 1-2 times daily.  - It seems that you have a good handle on these symptoms.  - Consider allergy shots for long term control.   3. Return in about 6 months (around 11/06/2019). This can be an in-person, a virtual Webex or a telephone follow up visit.   Please inform us of any Emergency Department visits, hospitalizations, or changes in symptoms. Call us before going to the ED for breathing or allergy symptoms since we might be able to fit you in for a sick visit. Feel free to contact us anytime with any questions, problems, or concerns.  It was a pleasure to see you again today! I love the visits with you!   Websites that have reliable patient information: 1. American Academy of Asthma, Allergy, and Immunology: www.aaaai.org 2. Food Allergy Research and Education (FARE): foodallergy.org 3. Mothers of Asthmatics: http://www.asthmacommunitynetwork.org 4. American College of Allergy, Asthma, and Immunology: www.acaai.org   COVID-19 Vaccine Information can be found at: ShippingScam.co.uk For questions related to vaccine distribution or appointments, please email  [email or web address] or call [phone].     "Like" [place] on Facebook and Instagram for our latest updates!        Make sure you are registered to vote! If you have moved or changed any of your contact information, you will need to get this updated before voting!  In some cases, you MAY be able to register to vote online: [email or web address]

## 2019-06-02 ENCOUNTER — Ambulatory Visit: Payer: Medicare PPO | Admitting: Family Medicine

## 2019-06-02 ENCOUNTER — Other Ambulatory Visit: Payer: Self-pay

## 2019-06-02 ENCOUNTER — Encounter: Payer: Self-pay | Admitting: Family Medicine

## 2019-06-02 ENCOUNTER — Other Ambulatory Visit (HOSPITAL_COMMUNITY)
Admission: RE | Admit: 2019-06-02 | Discharge: 2019-06-02 | Disposition: A | Discharge status: Home / Self Care | Payer: Medicare PPO | Source: Ambulatory Visit | Attending: Family Medicine | Admitting: Family Medicine

## 2019-06-02 VITALS — BP 120/88 | HR 66 | Temp 97.7°F | Resp 18 | Ht 65.5 in | Wt 189.4 lb

## 2019-06-02 DIAGNOSIS — N760 Acute vaginitis: Secondary | ICD-10-CM | POA: Insufficient documentation

## 2019-06-02 DIAGNOSIS — R350 Frequency of micturition: Secondary | ICD-10-CM

## 2019-06-02 DIAGNOSIS — R300 Dysuria: Secondary | ICD-10-CM

## 2019-06-02 LAB — POCT URINALYSIS DIP (MANUAL ENTRY)
Bilirubin, UA: NEGATIVE
Glucose, UA: NEGATIVE mg/dL
Ketones, POC UA: NEGATIVE mg/dL
Leukocytes, UA: NEGATIVE
Nitrite, UA: NEGATIVE
Spec Grav, UA: 1.025 (ref 1.010–1.025)
Urobilinogen, UA: 0.2 E.U./dL
pH, UA: 6 (ref 5.0–8.0)

## 2019-06-02 MED ORDER — FLUCONAZOLE 150 MG PO TABS: ORAL_TABLET | ORAL | 0 refills | Status: DC

## 2019-06-02 MED ORDER — CIPROFLOXACIN HCL 250 MG PO TABS: 250.0000 mg | ORAL_TABLET | Freq: Two times a day (BID) | ORAL | 0 refills | Status: DC

## 2019-06-02 MED FILL — CIPROFLOXACIN HCL 250 MG TA: 250 | 3 days supply | Qty: 6 | Fill #0

## 2019-06-02 MED FILL — FLUCONAZOLE 150 MG TABS: 150 | 4 days supply | Qty: 2 | Fill #0

## 2019-06-02 NOTE — Patient Instructions (Signed)
Urinary Tract Infection, Adult A urinary tract infection (UTI) is an infection of any part of the urinary tract. The urinary tract includes:  The kidneys.  The ureters.  The bladder.  The urethra. These organs make, store, and get rid of pee (urine) in the body. What are the causes? This is caused by germs (bacteria) in your genital area. These germs grow and cause swelling (inflammation) of your urinary tract. What increases the risk? You are more likely to develop this condition if:  You have a small, thin tube (catheter) to drain pee.  You cannot control when you pee or poop (incontinence).  You are female, and: ? You use these methods to prevent pregnancy:  A medicine that kills sperm (spermicide).  A device that blocks sperm (diaphragm). ? You have low levels of a female hormone (estrogen). ? You are pregnant.  You have genes that add to your risk.  You are sexually active.  You take antibiotic medicines.  You have trouble peeing because of: ? A prostate that is bigger than normal, if you are female. ? A blockage in the part of your body that drains pee from the bladder (urethra). ? A kidney stone. ? A nerve condition that affects your bladder (neurogenic bladder). ? Not getting enough to drink. ? Not peeing often enough.  You have other conditions, such as: ? Diabetes. ? A weak disease-fighting system (immune system). ? Sickle cell disease. ? Gout. ? Injury of the spine. What are the signs or symptoms? Symptoms of this condition include:  Needing to pee right away (urgently).  Peeing often.  Peeing small amounts often.  Pain or burning when peeing.  Blood in the pee.  Pee that smells bad or not like normal.  Trouble peeing.  Pee that is cloudy.  Fluid coming from the vagina, if you are female.  Pain in the belly or lower back. Other symptoms include:  Throwing up (vomiting).  No urge to eat.  Feeling mixed up (confused).  Being tired  and grouchy (irritable).  A fever.  Watery poop (diarrhea). How is this treated? This condition may be treated with:  Antibiotic medicine.  Other medicines.  Drinking enough water. Follow these instructions at home:  Medicines  Take over-the-counter and prescription medicines only as told by your doctor.  If you were prescribed an antibiotic medicine, take it as told by your doctor. Do not stop taking it even if you start to feel better. General instructions  Make sure you: ? Pee until your bladder is empty. ? Do not hold pee for a long time. ? Empty your bladder after sex. ? Wipe from front to back after pooping if you are a female. Use each tissue one time when you wipe.  Drink enough fluid to keep your pee pale yellow.  Keep all follow-up visits as told by your doctor. This is important. Contact a doctor if:  You do not get better after 1-2 days.  Your symptoms go away and then come back. Get help right away if:  You have very bad back pain.  You have very bad pain in your lower belly.  You have a fever.  You are sick to your stomach (nauseous).  You are throwing up. Summary  A urinary tract infection (UTI) is an infection of any part of the urinary tract.  This condition is caused by germs in your genital area.  There are many risk factors for a UTI. These include having a small, thin   tube to drain pee and not being able to control when you pee or poop.  Treatment includes antibiotic medicines for germs.  Drink enough fluid to keep your pee pale yellow. This information is not intended to replace advice given to you by your health care provider. Make sure you discuss any questions you have with your health care provider. Document Revised: 03/17/2018 Document Reviewed: 10/07/2017 Elsevier Patient Education  2020 [employer]  

## 2019-06-02 NOTE — Progress Notes (Signed)
Patient ID: RIANN CREPPEL, female    DOB: [birth year]  Age: 74 y.o. MRN: [id number]    Subjective:  Subjective  HPI EMILIO WELTMAN presents for urinary frequency x 1 1/2 weeks   + dysuria  And rash in groin    + vaginal d/c an odor +  Review of Systems  Constitutional: Negative for activity change, appetite change, chills, diaphoresis, fatigue, fever and unexpected weight change.  Eyes: Negative for pain, redness and visual disturbance.  Respiratory: Negative for cough, chest tightness, shortness of breath and wheezing.   Cardiovascular: Negative for chest pain, palpitations and leg swelling.  Gastrointestinal: Negative for abdominal distention and abdominal pain.  Endocrine: Negative for cold intolerance, heat intolerance, polydipsia, polyphagia and polyuria.  Genitourinary: Positive for dysuria, frequency, urgency and vaginal discharge. Negative for difficulty urinating, dyspareunia, flank pain, genital sores, hematuria, menstrual problem, pelvic pain and vaginal pain.  Musculoskeletal: Negative for back pain.  Neurological: Negative for dizziness, light-headedness, numbness and headaches.    History Past Medical History:  Diagnosis Date  . Allergy   . Anemia    h/o  . Asthma   . Asthma, mild intermittent, well-controlled 02/10/2014  . Blood transfusion without reported diagnosis   . Chicken pox as a child  . Diabetes mellitus    type-II, per Dr. Parke Simmers, no meds. , pt. reports weight loss has contributed  to control of diabetes.  She reports last HgbA1c- wnl.  . Diabetes mellitus 11/05/2010  . Diabetes mellitus type 2 in obese (HCC) 11/05/2010  . Diabetes mellitus type 2, uncontrolled (HCC) 11/05/2010  . Esophageal reflux 02/10/2014  . GERD (gastroesophageal reflux disease)    rare use of OTC aid  . Hyperglycemia 09/02/2015  . Hyperlipidemia   . Hypertension   . Measles as a child  . Medicare annual wellness visit, subsequent 08/06/2014  . Mumps as a child  . Obesity 02/10/2014   . Pneumonia    as a child  . Portal vein thrombosis 03/09/2011   coumadin ended summer of 2013  . Preventative health care 08/12/2014  . Rash and nonspecific skin eruption 01/07/2015  . Seborrheic keratosis 02/10/2014   Follows with dermatology  . Sinusitis, acute 03/23/2016    She has a past surgical history that includes Knee arthroscopy w/ meniscal repair; Tonsillectomy; Umbilical hernia repair (03/16/2012); Insertion of mesh (03/16/2012); and Breast surgery.   Her family history includes Aneurysm (age of onset: 108) in her mother; Cancer in her father and maternal aunt.She reports that she has never smoked. She has never used smokeless tobacco. She reports current alcohol use. She reports that she does not use drugs.  Current Outpatient Medications on File Prior to Visit  Medication Sig Dispense Refill  . ACCU-CHEK AVIVA PLUS test strip use to check blood sugar once a day as directed 100 each 0  . albuterol (PROVENTIL HFA) 108 (90 Base) MCG/ACT inhaler Inhale 2 puffs into the lungs every 4 (four) hours as needed for wheezing or shortness of breath. 18 g 1  . aspirin 81 MG tablet Take 81 mg by mouth daily.     . budesonide-formoterol (SYMBICORT) 160-4.5 MCG/ACT inhaler Inhale 2 puffs into the lungs 2 (two) times daily. 1 Inhaler 5  . enalapril (VASOTEC) 2.5 MG tablet Take 1 tablet (2.5 mg total) by mouth daily. 90 tablet 1  . EPIPEN 2-PAK 0.3 MG/0.3ML SOAJ injection Use as directed for severe allergic reactions. 2 Device 2  . fluticasone (FLONASE) 50 MCG/ACT nasal spray Place 2 sprays  into both nostrils daily. 16 g 5  . ketotifen (ZADITOR) 0.025 % ophthalmic solution Place 1 drop into both eyes 2 (two) times daily. (Patient taking differently: Place 1 drop into both eyes 2 (two) times daily as needed. ) 5 mL 0  . Lancets 30G MISC Use as directed once daily to check blood sugar.  Diagnosis code E11.9 100 each 5  . montelukast (SINGULAIR) 10 MG tablet Take 1 tablet (10 mg total) by mouth at  bedtime. 90 tablet 5  . Multiple Vitamins-Minerals (MULTIVITAMIN WITH MINERALS) tablet Take 1 tablet by mouth daily.      . Omega-3 Fatty Acids (FISH OIL CONCENTRATE) 1000 MG CAPS Take by mouth 3 (three) times daily.     No current facility-administered medications on file prior to visit.     Objective:  Objective  Physical Exam Vitals and nursing note reviewed.  Constitutional:      Appearance: She is well-developed.  HENT:     Head: Normocephalic and atraumatic.  Eyes:     Conjunctiva/sclera: Conjunctivae normal.  Neck:     Thyroid: No thyromegaly.     Vascular: No carotid bruit or JVD.  Cardiovascular:     Rate and Rhythm: Normal rate and regular rhythm.     Heart sounds: Normal heart sounds. No murmur.  Pulmonary:     Effort: Pulmonary effort is normal. No respiratory distress.     Breath sounds: Normal breath sounds. No wheezing or rales.  Chest:     Chest wall: No tenderness.  Musculoskeletal:     Cervical back: Normal range of motion and neck supple.  Neurological:     Mental Status: She is alert and oriented to person, place, and time.    BP 120/88 (BP Location: Right Arm, Patient Position: Sitting, Cuff Size: Normal)   Pulse 66   Temp 97.7 F (36.5 C) (Temporal)   Resp 18   Ht 5' 5.5" (1.664 m)   Wt 189 lb 6.4 oz (85.9 kg)   SpO2 98%   BMI 31.04 kg/m  Wt Readings from Last 3 Encounters:  06/02/19 189 lb 6.4 oz (85.9 kg)  05/09/19 192 lb 12.8 oz (87.5 kg)  04/20/19 189 lb 12.8 oz (86.1 kg)     Lab Results  Component Value Date   WBC 5.9 04/20/2019   HGB 14.0 04/20/2019   HCT 42.7 04/20/2019   PLT 188.0 04/20/2019   GLUCOSE 120 (H) 04/20/2019   CHOL 258 (H) 04/20/2019   TRIG 120.0 04/20/2019   HDL 66.70 04/20/2019   LDLDIRECT 111.2 02/06/2014   LDLCALC 168 (H) 04/20/2019   ALT 11 04/20/2019   AST 12 04/20/2019   NA 138 04/20/2019   K 4.4 04/20/2019   CL 105 04/20/2019   CREATININE 0.69 04/20/2019   BUN 17 04/20/2019   CO2 26 04/20/2019    TSH 1.06 04/20/2019   INR 2.2 09/21/2011   HGBA1C 6.8 (H) 04/20/2019   MICROALBUR 2.1 (H) 08/13/2017    CT Abdomen Pelvis Wo Contrast  Result Date: 04/22/2018 CLINICAL DATA:  Umbilical hernia. EXAM: CT ABDOMEN AND PELVIS WITHOUT CONTRAST TECHNIQUE: Multidetector CT imaging of the abdomen and pelvis was performed following the standard protocol without IV contrast. COMPARISON:  CT scan of September 25, 2011. FINDINGS: Lower chest: No acute abnormality. Hepatobiliary: Multiple large gallstones are noted without inflammation. No biliary dilatation is noted. No abnormality seen in the liver on these unenhanced images. Pancreas: Unremarkable. No pancreatic ductal dilatation or surrounding inflammatory changes. Spleen: Normal in size  without focal abnormality. Adrenals/Urinary Tract: Adrenal glands appear normal. Exophytic right renal cyst is noted. Probable angiomyolipoma seen in midpole cortex of right kidney. No hydronephrosis or renal obstruction is noted. No renal or ureteral calculi are noted. Urinary bladder is unremarkable. Stomach/Bowel: The stomach appears normal. There is no evidence of bowel obstruction or inflammation. Stool is seen throughout the colon. Diverticulosis of descending colon is noted without inflammation. The appendix appears normal. Vascular/Lymphatic: Aortic atherosclerosis. No enlarged abdominal or pelvic lymph nodes. Reproductive: Enlarged fibroid uterus is noted. No adnexal abnormality is noted. Other: Moderate size fat containing supraumbilical hernia is noted. Musculoskeletal: No acute or significant osseous findings. IMPRESSION: Cholelithiasis without inflammation. Probable right renal angiomyolipoma. Diverticulosis of descending colon without inflammation. Enlarged fibroid uterus. Moderate size fat containing supraumbilical hernia. Aortic Atherosclerosis (ICD10-I70.0). Electronically Signed   By: Lupita Raider, M.D.   On: 04/22/2018 13:48   MM 3D SCREEN BREAST BILATERAL  Result  Date: 04/22/2018 CLINICAL DATA:  Screening. EXAM: DIGITAL SCREENING BILATERAL MAMMOGRAM WITH TOMO AND CAD COMPARISON:  Previous exam(s). ACR Breast Density Category b: There are scattered areas of fibroglandular density. FINDINGS: There are no findings suspicious for malignancy. Images were processed with CAD. IMPRESSION: No mammographic evidence of malignancy. A result letter of this screening mammogram will be mailed directly to the patient. RECOMMENDATION: Screening mammogram in one year. (Code:SM-B-01Y) BI-RADS CATEGORY  1: Negative. Electronically Signed   By: Ted Mcalpine M.D.   On: 04/22/2018 17:12     Assessment & Plan:  Plan  I have discontinued Randilynn Fraze. Sangalang's Breo Ellipta and amoxicillin. I am also having her start on ciprofloxacin and fluconazole. Additionally, I am having her maintain her multivitamin with minerals, aspirin, Fish Oil Concentrate, Lancets 30G, EpiPen 2-Pak, ketotifen, enalapril, Accu-Chek Aviva Plus, albuterol, fluticasone, montelukast, and budesonide-formoterol.  Meds ordered this encounter  Medications  . ciprofloxacin (CIPRO) 250 MG tablet    Sig: Take 1 tablet (250 mg total) by mouth 2 (two) times daily.    Dispense:  6 tablet    Refill:  0  . fluconazole (DIFLUCAN) 150 MG tablet    Sig: 1 po x1, may repeat in 3 days prn    Dispense:  2 tablet    Refill:  0    Problem List Items Addressed This Visit    None    Visit Diagnoses    Acute vaginitis    -  Primary   Relevant Medications   fluconazole (DIFLUCAN) 150 MG tablet   Other Relevant Orders   Urine cytology ancillary only([place])   Urinary frequency       Relevant Orders   POCT urinalysis dipstick (Completed)   Urine Culture   Dysuria       Relevant Medications   ciprofloxacin (CIPRO) 250 MG tablet      Follow-up: Return if symptoms worsen or fail to improve.  Donato Schultz, DO

## 2019-06-06 ENCOUNTER — Other Ambulatory Visit: Payer: Self-pay

## 2019-06-06 ENCOUNTER — Other Ambulatory Visit (INDEPENDENT_AMBULATORY_CARE_PROVIDER_SITE_OTHER): Payer: Medicare PPO

## 2019-06-06 DIAGNOSIS — R350 Frequency of micturition: Secondary | ICD-10-CM

## 2019-06-07 LAB — URINE CULTURE
MICRO NUMBER:: 10178094
Result:: NO GROWTH
SPECIMEN QUALITY:: ADEQUATE

## 2019-06-09 LAB — URINE CYTOLOGY ANCILLARY ONLY
Bacterial Vaginitis-Urine: NEGATIVE
Candida Urine: NEGATIVE
Chlamydia: NEGATIVE
Comment: NEGATIVE
Comment: NEGATIVE
Comment: NORMAL
Neisseria Gonorrhea: NEGATIVE
Trichomonas: NEGATIVE

## 2019-09-06 ENCOUNTER — Other Ambulatory Visit: Payer: Self-pay | Admitting: Family Medicine

## 2019-09-26 ENCOUNTER — Other Ambulatory Visit: Payer: Self-pay

## 2019-09-26 ENCOUNTER — Encounter: Payer: Self-pay | Admitting: Allergy & Immunology

## 2019-09-26 ENCOUNTER — Ambulatory Visit: Payer: Medicare PPO | Admitting: Allergy & Immunology

## 2019-09-26 VITALS — BP 128/82 | HR 76 | Temp 98.4°F | Resp 17 | Ht 66.0 in | Wt 190.6 lb

## 2019-09-26 DIAGNOSIS — J209 Acute bronchitis, unspecified: Secondary | ICD-10-CM | POA: Diagnosis not present

## 2019-09-26 DIAGNOSIS — J4540 Moderate persistent asthma, uncomplicated: Secondary | ICD-10-CM

## 2019-09-26 DIAGNOSIS — J302 Other seasonal allergic rhinitis: Secondary | ICD-10-CM

## 2019-09-26 DIAGNOSIS — J3089 Other allergic rhinitis: Secondary | ICD-10-CM | POA: Diagnosis not present

## 2019-09-26 DIAGNOSIS — K219 Gastro-esophageal reflux disease without esophagitis: Secondary | ICD-10-CM | POA: Diagnosis not present

## 2019-09-26 MED ORDER — CLARITHROMYCIN 500 MG PO TABS: 500.0000 mg | ORAL_TABLET | Freq: Two times a day (BID) | ORAL | 0 refills | Status: AC

## 2019-09-26 NOTE — Progress Notes (Signed)
FOLLOW UP  Date of Service/Encounter:  09/26/19   Assessment:   Moderate persistent asthma without complication  Seasonal and perennial allergic rhinitis(grasses, weeds, molds, dust mites)  Acute bronchitis  Gastroesophageal reflux disease   Asthma Reportables: Severity:moderate persistent Risk:low Control:well controlled  Plan/Recommendations:   1. Moderate persistent asthma, uncomplicated - Lung testing looked amazing today. - We are not going to make any medication changes at this time.  - Daily controller medication(s): Symbicort 160/4.38mcg two puffs twice daily + montelukast 10mg  daily - Rescue medications: albuterol 4 puffs every 4-6 hours as needed - Asthma control goals:  * Full participation in all desired activities (may need albuterol before activity) * Albuterol use two time or less a week on average (not counting use with activity) * Cough interfering with sleep two time or less a month * Oral steroids no more than once a year * No hospitalizations  2. Perennial and seasonal rhinitis (grasses, weeds, molds, dust mites) - Continue with your nose spray 1-2 times daily.  - It seems that you have a good handle on these symptoms.    3. Acute sinusitis - With your current symptoms and time course, antibiotics are needed: Amoxicillin 875mg  twice daily for 10 days - Call us on Friday if there is no improvement.  - Add on nasal saline spray (i.e., Simply Saline) or nasal saline lavage (i.e., NeilMed) as needed prior to medicated nasal sprays.  4. Return in about 6 months (around 03/27/2020). This can be an in-person, a virtual Webex or a telephone follow up visit.  Subjective:   Vicki Dennis is a 74 y.o. female presenting today for follow up of  Chief Complaint  Patient presents with  . Asthma    coughing    Vicki Dennis has a history of the following: Patient Active Problem List   Diagnosis Date Noted  . Hiatal hernia 01/31/2018  .  Hemangioma of liver 01/31/2018  . Renal cyst 01/31/2018  . Seasonal and perennial allergic rhinitis 06/17/2017  . Moderate persistent asthma without complication 89/38/1017  . Abdominal mass 04/09/2017  . Sinusitis 03/23/2016  . Follicular acne 51/05/5850  . Preventative health care 08/12/2014  . Medicare annual wellness visit, subsequent 08/06/2014  . Obesity 02/10/2014  . History of colonic polyps 02/10/2014  . Gastroesophageal reflux disease 02/10/2014  . Seborrheic keratosis 02/10/2014  . Asthma, mild intermittent, well-controlled 02/10/2014  . History of chicken pox   . Measles   . Mumps   . Allergy   . Umbilical hernia 77/82/4235  . Portal vein thrombosis 03/09/2011  . Diabetes mellitus type 2 in obese ([facility]) 11/05/2010  . Hyperlipidemia 11/05/2010    History obtained from: chart review and patient.  Vicki Dennis is a 74 y.o. female presenting for a follow up visit. She was last seen in July 2021. At that time, lung testing looked good. We did send in Symbicort two puffs BID. She was going to use up her Breo and then start the Symbicort. For her rhinitis, we continued with nasal spray 1-2 times daily.   Since last visit, she has mostly done well.  She is very talkative today as ever and smiling.  Asthma/Respiratory Symptom History: She remains on the Symbicort 2 puffs in the morning and 2 puffs at night.  She has been compliant with the medication.  She has had no asthma exacerbations.  She has not been to the emergency room and has not required any prednisone.  Allergic Rhinitis Symptom History: She reports  that her allergies have been under fairly good control.  She remains on the nasal spray 1-2 times daily.  She seems to have a good handle on the symptoms.  However, over the last 4 weeks, she has developed a chronic cough productive of yellow to green sputum.  She has tried Mucinex and over the counter cough medicines without much improvement.  She has not been on antibiotics in  quite some time.  Otherwise, there have been no changes to her past medical history, surgical history, family history, or social history.    Review of Systems  Constitutional: Negative.  Negative for chills, fever, malaise/fatigue and weight loss.  HENT: Positive for congestion. Negative for ear discharge, ear pain and sinus pain.        Positive for postnasal drip.  Eyes: Negative for pain, discharge and redness.  Respiratory: Positive for cough. Negative for sputum production, shortness of breath and wheezing.   Cardiovascular: Negative.  Negative for chest pain and palpitations.  Gastrointestinal: Negative for abdominal pain, constipation, diarrhea, heartburn, nausea and vomiting.  Skin: Negative.  Negative for itching and rash.  Neurological: Negative for dizziness and headaches.  Endo/Heme/Allergies: Positive for environmental allergies. Does not bruise/bleed easily.       Objective:   Blood pressure 128/82, pulse 76, temperature 98.4 F (36.9 C), temperature source Temporal, resp. rate 17, height 5\' 6"  (1.676 m), weight 190 lb 9.6 oz (86.5 kg), SpO2 98 %. Body mass index is 30.76 kg/m.   Physical Exam:  Physical Exam  Constitutional: She appears well-developed.  HENT:  Head: Normocephalic and atraumatic.  Right Ear: Tympanic membrane, external ear and ear canal normal.  Left Ear: Tympanic membrane and ear canal normal.  Nose: No mucosal edema, rhinorrhea, nasal deformity or septal deviation. Right sinus exhibits no maxillary sinus tenderness and no frontal sinus tenderness. Left sinus exhibits no maxillary sinus tenderness and no frontal sinus tenderness.  Mouth/Throat: Uvula is midline. Mucous membranes are not pale and not dry.  Eyes: Pupils are equal, round, and reactive to light. Conjunctivae are normal. Right eye exhibits no chemosis and no discharge. Left eye exhibits no chemosis and no discharge. Right conjunctiva is not injected. Left conjunctiva is not injected.    Cardiovascular: Normal rate, regular rhythm and normal heart sounds.  Respiratory: Effort normal. No accessory muscle usage. No tachypnea. No respiratory distress. She has no wheezes. She has rhonchi. She has no rales. She exhibits no tenderness.  Lymphadenopathy:    She has no cervical adenopathy.  Neurological: She is alert.  Skin: No abrasion, no petechiae and no rash noted. Rash is not papular, not vesicular and not urticarial. No erythema. No pallor.  No eczematous or urticarial lesions noted.     Diagnostic studies:    Spirometry: results normal (FEV1: 1.86/100%, FVC: 2.66/%, FEV1/FVC: 70%).    Spirometry consistent with normal pattern.  Allergy Studies: none      Salvatore Marvel, MD  [facility]

## 2019-09-26 NOTE — Patient Instructions (Addendum)
1. Moderate persistent asthma, uncomplicated - Lung testing looked amazing today. - We are not going to make any medication changes at this time.  - Daily controller medication(s): Symbicort 160/4.63mcg two puffs twice daily + montelukast 10mg  daily - Rescue medications: albuterol 4 puffs every 4-6 hours as needed - Asthma control goals:  * Full participation in all desired activities (may need albuterol before activity) * Albuterol use two time or less a week on average (not counting use with activity) * Cough interfering with sleep two time or less a month * Oral steroids no more than once a year * No hospitalizations  2. Perennial and seasonal rhinitis (grasses, weeds, molds, dust mites) - Continue with your nose spray 1-2 times daily.  - It seems that you have a good handle on these symptoms.    3. Acute sinusitis - With your current symptoms and time course, antibiotics are needed: Amoxicillin 875mg  twice daily for 10 days - Call us on Friday if there is no improvement.  - Add on nasal saline spray (i.e., Simply Saline) or nasal saline lavage (i.e., NeilMed) as needed prior to medicated nasal sprays.  4. Return in about 6 months (around 03/27/2020). This can be an in-person, a virtual Webex or a telephone follow up visit.   Please inform us of any Emergency Department visits, hospitalizations, or changes in symptoms. Call us before going to the ED for breathing or allergy symptoms since we might be able to fit you in for a sick visit. Feel free to contact us anytime with any questions, problems, or concerns.  It was a pleasure to see you again today!  Websites that have reliable patient information: 1. American Academy of Asthma, Allergy, and Immunology: www.aaaai.org 2. Food Allergy Research and Education (FARE): foodallergy.org 3. Mothers of Asthmatics: http://www.asthmacommunitynetwork.org 4. American College of Allergy, Asthma, and Immunology: www.acaai.org   COVID-19  Vaccine Information can be found at: ShippingScam.co.uk For questions related to vaccine distribution or appointments, please email [email or web address] or call [phone].     Like [place] on [place] and Instagram for our latest updates!        Make sure you are registered to vote! If you have moved or changed any of your contact information, you will need to get this updated before voting!  In some cases, you MAY be able to register to vote online: [email or web address]

## 2019-09-27 ENCOUNTER — Encounter: Payer: Self-pay | Admitting: Allergy & Immunology

## 2019-10-24 ENCOUNTER — Telehealth: Payer: Self-pay

## 2019-10-24 NOTE — Telephone Encounter (Signed)
Patient called to let Dr Ernst Bowler know she is feeling much better since having the clarithromycin. She is going on vacation 11-11-19 for 2 weeks. She is wondering can she get a few tablets to take with her on vacation just in case.  Please Advise.

## 2019-10-25 NOTE — Telephone Encounter (Signed)
Dr. Ernst Bowler you deepest thought is greatly considered.

## 2019-10-26 NOTE — Telephone Encounter (Signed)
I am glad that she is feeling better.  We do not typically give antibiotics to have on hand just in case.  If she has any issues while she is on vacation, she can certainly contact us we can talk to her over the phone and call in prescriptions if deemed necessary to pharmacy close to her.  Salvatore Marvel, MD [facility]

## 2019-10-26 NOTE — Telephone Encounter (Signed)
Patient was notified of this information. Patient verbalized understanding.

## 2020-01-17 ENCOUNTER — Other Ambulatory Visit: Payer: Self-pay | Admitting: Family Medicine

## 2020-01-19 ENCOUNTER — Telehealth: Payer: Self-pay | Admitting: Family Medicine

## 2020-01-19 NOTE — Telephone Encounter (Signed)
CallerLetitia Dennis  Call Back # [phone]  Patient would like to know if ok to go Moderna Booster Vaccine  Patient states that her glucose meter will eventually  be considered old and she will need a new script for glucometer.   Please advise

## 2020-01-20 ENCOUNTER — Other Ambulatory Visit: Payer: Self-pay | Admitting: Family Medicine

## 2020-01-21 NOTE — Telephone Encounter (Signed)
Yes she should take the [facility] booster once it has been approved. Please send her in a prescription for a glucometer with test strips sig: check sugar daily and prn. Disp #100 with 3 rf

## 2020-01-22 ENCOUNTER — Other Ambulatory Visit: Payer: Self-pay

## 2020-01-22 DIAGNOSIS — E1169 Type 2 diabetes mellitus with other specified complication: Secondary | ICD-10-CM

## 2020-01-22 MED ORDER — BLOOD GLUCOSE MONITOR KIT: PACK | 0 refills | Status: AC

## 2020-01-22 NOTE — Telephone Encounter (Signed)
Patient has been notified and glucometer kit and supplies has been sent to the pharmacy.

## 2020-01-24 ENCOUNTER — Ambulatory Visit (INDEPENDENT_AMBULATORY_CARE_PROVIDER_SITE_OTHER): Payer: Medicare PPO | Admitting: Family

## 2020-01-24 ENCOUNTER — Other Ambulatory Visit: Payer: Self-pay

## 2020-01-24 ENCOUNTER — Encounter: Payer: Self-pay | Admitting: Family

## 2020-01-24 VITALS — BP 126/78 | HR 68 | Temp 98.4°F | Resp 16 | Ht 66.0 in | Wt 191.0 lb

## 2020-01-24 DIAGNOSIS — Z111 Encounter for screening for respiratory tuberculosis: Secondary | ICD-10-CM | POA: Diagnosis not present

## 2020-01-24 DIAGNOSIS — Z0000 Encounter for general adult medical examination without abnormal findings: Secondary | ICD-10-CM | POA: Diagnosis not present

## 2020-01-24 DIAGNOSIS — E669 Obesity, unspecified: Secondary | ICD-10-CM | POA: Diagnosis not present

## 2020-01-24 DIAGNOSIS — E1169 Type 2 diabetes mellitus with other specified complication: Secondary | ICD-10-CM

## 2020-01-24 DIAGNOSIS — E785 Hyperlipidemia, unspecified: Secondary | ICD-10-CM

## 2020-01-24 NOTE — Progress Notes (Signed)
Subjective:    Patient ID: Vicki Dennis, female    DOB: 05-May-1945, 74 y.o.   MRN: [id number]  HPI  Patient is a 74 yr old female who presents today for physical.  Immunizations: flu shot up to date, completed moderna series.  Pneumovax 23 and 13 up to date. Completed shingrix series Diet: reports healthy diet.  Exercise: stays active at work Colonoscopy: 2019 Dexa: 2018, normal Pap Smear: N/A Mammogram: 04/22/18  DM2- diet controlled.  On enalapril for microalbuminuria. Not on statin.   Lab Results  Component Value Date   HGBA1C 6.8 (H) 04/20/2019   HGBA1C 6.9 (H) 12/16/2018   HGBA1C 6.9 (H) 08/13/2017   Lab Results  Component Value Date   MICROALBUR 2.1 (H) 08/13/2017   LDLCALC 168 (H) 04/20/2019   CREATININE 0.69 04/20/2019   Wt Readings from Last 3 Encounters:  01/24/20 191 lb (86.6 kg)  09/26/19 190 lb 9.6 oz (86.5 kg)  06/02/19 189 lb 6.4 oz (85.9 kg)       Review of Systems  Constitutional: Negative for unexpected weight change.  HENT: Negative for rhinorrhea.   Eyes: Negative for visual disturbance.  Respiratory: Negative for cough.   Cardiovascular: Negative for chest pain.  Gastrointestinal: Negative for constipation and diarrhea.  Genitourinary: Negative for dysuria and frequency.  Musculoskeletal: Positive for arthralgias (occasionally).  Skin: Negative for rash.  Neurological: Negative for headaches.  Hematological: Negative for adenopathy.  Psychiatric/Behavioral:       Denies depression/anxiety          Objective:   Physical Exam   Physical Exam  Constitutional: She is oriented to person, place, and time. She appears well-developed and well-nourished. No distress.  HENT:  Head: Normocephalic and atraumatic.  Right Ear: Tympanic membrane and ear canal normal.  Left Ear: Tympanic membrane and ear canal normal.  Mouth/Throat: not examined- pt wearing mask Eyes: Pupils are equal, round, and reactive to light. No scleral icterus.   Neck: Normal range of motion. No thyromegaly present.  Cardiovascular: Normal rate and regular rhythm.   No murmur heard. Pulmonary/Chest: Effort normal and breath sounds normal. No respiratory distress. He has no wheezes. She has no rales. She exhibits no tenderness.  Abdominal: Soft. Bowel sounds are normal. Reducible ventral hernia noted above umbilicus. There is no tenderness. There is no rebound and no guarding.  Musculoskeletal: She exhibits no edema.  Lymphadenopathy:    She has no cervical adenopathy.  Neurological: She is alert and oriented to person, place, and time.  She exhibits normal muscle tone. Coordination normal.  Skin: Skin is warm and dry.  Psychiatric: She has a normal mood and affect. Her behavior is normal. Judgment and thought content normal.  Breast/pelvic: deferred           Assessment & Plan:        Assessment & Plan:  Hyperlipidemia- recommended statin therapy to decrease CV risk in the setting of DM2.  Pt declines statin therapy.   DM2- controlled with diet.  Obtain follow up A1C.  Preventative care- discussed healthy diet, exercise.  Colo up to date.  Ordered mammogram.  She needs tb testing for work form.   This visit occurred during the SARS-CoV-2 public health emergency.  Safety protocols were in place, including screening questions prior to the visit, additional usage of staff PPE, and extensive cleaning of exam room while observing appropriate contact time as indicated for disinfecting solutions.

## 2020-01-24 NOTE — Patient Instructions (Signed)
Please complete lab work prior to leaving.   

## 2020-01-25 ENCOUNTER — Encounter: Payer: Self-pay | Admitting: Family Medicine

## 2020-01-25 LAB — COMPREHENSIVE METABOLIC PANEL
AG Ratio: 1.8 (calc) (ref 1.0–2.5)
ALT: 14 U/L (ref 6–29)
AST: 16 U/L (ref 10–35)
Albumin: 4 g/dL (ref 3.6–5.1)
Alkaline phosphatase (APISO): 60 U/L (ref 37–153)
BUN: 16 mg/dL (ref 7–25)
CO2: 25 mmol/L (ref 20–32)
Calcium: 9.2 mg/dL (ref 8.6–10.4)
Chloride: 106 mmol/L (ref 98–110)
Creat: 0.64 mg/dL (ref 0.60–0.93)
Globulin: 2.2 g/dL (calc) (ref 1.9–3.7)
Glucose, Bld: 115 mg/dL — ABNORMAL HIGH (ref 65–99)
Potassium: 4.7 mmol/L (ref 3.5–5.3)
Sodium: 140 mmol/L (ref 135–146)
Total Bilirubin: 0.4 mg/dL (ref 0.2–1.2)
Total Protein: 6.2 g/dL (ref 6.1–8.1)

## 2020-01-25 LAB — LIPID PANEL
Cholesterol: 208 mg/dL — ABNORMAL HIGH (ref <200)
HDL: 71 mg/dL (ref >=50)
LDL Cholesterol (Calc): 115 mg/dL (calc) — ABNORMAL HIGH
Non-HDL Cholesterol (Calc): 137 mg/dL (calc) — ABNORMAL HIGH (ref <130)
Total CHOL/HDL Ratio: 2.9 (calc) (ref <5.0)
Triglycerides: 117 mg/dL (ref <150)

## 2020-01-25 LAB — HEMOGLOBIN A1C
Hgb A1c MFr Bld: 6.7 % of total Hgb — ABNORMAL HIGH (ref <5.7)
Mean Plasma Glucose: 146 (calc)
eAG (mmol/L): 8.1 (calc)

## 2020-01-26 LAB — QUANTIFERON-TB GOLD PLUS
Mitogen-NIL: >10 IU/mL
NIL: 0.08 IU/mL
QuantiFERON-TB Gold Plus: NEGATIVE
TB1-NIL: 0.01 IU/mL
TB2-NIL: <0 IU/mL

## 2020-03-25 ENCOUNTER — Encounter: Payer: Self-pay | Admitting: Allergy

## 2020-03-25 ENCOUNTER — Other Ambulatory Visit: Payer: Self-pay

## 2020-03-25 ENCOUNTER — Ambulatory Visit: Payer: Medicare PPO | Admitting: Allergy

## 2020-03-25 ENCOUNTER — Telehealth: Payer: Self-pay

## 2020-03-25 VITALS — BP 120/67 | HR 97 | Temp 97.7°F | Resp 18 | Ht 66.0 in | Wt 188.0 lb

## 2020-03-25 DIAGNOSIS — J3089 Other allergic rhinitis: Secondary | ICD-10-CM | POA: Diagnosis not present

## 2020-03-25 DIAGNOSIS — H1131 Conjunctival hemorrhage, right eye: Secondary | ICD-10-CM | POA: Diagnosis not present

## 2020-03-25 DIAGNOSIS — J302 Other seasonal allergic rhinitis: Secondary | ICD-10-CM | POA: Diagnosis not present

## 2020-03-25 DIAGNOSIS — J4540 Moderate persistent asthma, uncomplicated: Secondary | ICD-10-CM

## 2020-03-25 DIAGNOSIS — H1031 Unspecified acute conjunctivitis, right eye: Secondary | ICD-10-CM

## 2020-03-25 NOTE — Assessment & Plan Note (Signed)
Right eye erythema with minimal pruritus and discharge. Denies pain or vision changes. Used salt water with no benefit.   Based on exam, not sure what triggered this episode. Less likely to be allergic conjunctivitis.   Recommend seeing an eye doctor as soon as possible - staff give list with phone numbers.   Please only use refresh eye drops as needed.  Do not use any salt water in the eye for now.

## 2020-03-25 NOTE — Progress Notes (Signed)
Follow Up Note  RE: Vicki Dennis MRN: [id number] DOB: [birth year] Date of Office Visit: 03/25/2020  Referring provider: Bradd Canary, MD Primary care provider: Bradd Canary, MD  Chief Complaint: Allergic Rhinitis  (Sinus problem) and Eye Problem (Redness in the right eye )  History of Present Illness: I had the pleasure of seeing Vicki Dennis for a follow up visit at the [facility] on 03/25/2020. She is a 74 y.o. female, who is being followed for asthma, allergic rhinitis. Her previous allergy office visit was on 09/26/2019 with Dr. Dellis Anes. Today is a new complaint visit of pink eye.  2 days ago woke up and her friend noticed that her right eye was red. Denies any pruritus, pain or vision changes initially. She then developed some pruritus of the right eye. She boiled water and let it cool down and put some salt w/iodine in it. This helped the itching but the erythema is still the same. Noticed some mucous and sticky feeling. No vision changes.  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   No past history of eye issues but no recent eye exam.   1. Moderate persistent asthma. Currently using Symbicort 1 puff 1-2 times a day at times and montelukast daily. Denies an ER/urgent care visits or prednisone use since the last visit.  2. Perennial and seasonal rhinitis (grasses, weeds, molds, dust mites) Using Flonase 1 spray per nostril once a day.  Assessment and Plan: Vicki Dennis is a 74 y.o. female with: Acute conjunctivitis of right eye Right eye erythema with minimal pruritus and discharge. Denies pain or vision changes. Used salt water with no benefit.   Based on exam, not sure what triggered this episode. Less likely to be allergic conjunctivitis.   Recommend seeing an eye doctor as soon as possible - staff give list with phone numbers.   Please only use refresh eye drops as needed.  Do not use any salt  water in the eye for now.  Moderate persistent asthma without complication Only using Symbicort as needed but takes montelukast daily. . Daily controller medication(s): continue with Symbicort 1-2 puffs 1-2 times a day.  . During upper respiratory infections/asthma flares: start Symbicort 2 puffs twice a day for 1-2 weeks until your breathing symptoms return to baseline.  . May use albuterol rescue inhaler 2 puffs every 4 to 6 hours as needed for shortness of breath, chest tightness, coughing, and wheezing. May use albuterol rescue inhaler 2 puffs 5 to 15 minutes prior to strenuous physical activities. Monitor frequency of use.  . Did not get spirometry today due to her eye issues and did not want to increase intraocular pressure.  . Get spirometry at next visit.   Seasonal and perennial allergic rhinitis Stable.  May use Flonase (fluticasone) nasal spray 1 spray per nostril twice a day as needed for nasal congestion.   Nasal saline spray (i.e., Simply Saline) or nasal saline lavage (i.e., NeilMed) is recommended as needed and prior to medicated nasal sprays.  May use over the counter antihistamines such as Zyrtec (cetirizine), Claritin (loratadine), Allegra (fexofenadine), or Xyzal (levocetirizine) daily as needed.  Return in about 2 months (around 05/26/2020).  Diagnostics: None.  Medication List:  Current Outpatient Medications  Medication Sig Dispense Refill  . ACCU-CHEK AVIVA PLUS test strip USE TO CHECK BLOOD SUGAR ONCE DAILY AS DIRECTED 100 each 1  . albuterol (PROVENTIL HFA) 108 (  90 Base) MCG/ACT inhaler Inhale 2 puffs into the lungs every 4 (four) hours as needed for wheezing or shortness of breath. 18 g 1  . aspirin 81 MG tablet Take 81 mg by mouth daily.     . blood glucose meter kit and supplies KIT Dispense based on patient and insurance preference. Use up to four times daily as directed. (FOR ICD-9 250.00, 250.01). 1 each 0  . budesonide-formoterol  (SYMBICORT) 160-4.5 MCG/ACT inhaler Inhale 2 puffs into the lungs 2 (two) times daily. 1 Inhaler 5  . enalapril (VASOTEC) 2.5 MG tablet TAKE ONE TABLET BY MOUTH ONE TIME DAILY 90 tablet 0  . EPIPEN 2-PAK 0.3 MG/0.3ML SOAJ injection Use as directed for severe allergic reactions. 2 Device 2  . fluticasone (FLONASE) 50 MCG/ACT nasal spray Place 2 sprays into both nostrils daily. 16 g 5  . ketotifen (ZADITOR) 0.025 % ophthalmic solution Place 1 drop into both eyes 2 (two) times daily. (Patient taking differently: Place 1 drop into both eyes 2 (two) times daily as needed.) 5 mL 0  . Lancets 30G MISC Use as directed once daily to check blood sugar.  Diagnosis code E11.9 100 each 5  . montelukast (SINGULAIR) 10 MG tablet Take 1 tablet (10 mg total) by mouth at bedtime. 90 tablet 5  . Multiple Vitamins-Minerals (MULTIVITAMIN WITH MINERALS) tablet Take 1 tablet by mouth daily.    . Omega-3 Fatty Acids (FISH OIL CONCENTRATE) 1000 MG CAPS Take by mouth 3 (three) times daily.     No current facility-administered medications for this visit.   Allergies: Allergies  Allergen Reactions  . Codeine Other (See Comments)    "makes me crazy"  . Sulfa Antibiotics Anaphylaxis    "died 3 times".  . Doxycycline Other (See Comments)    "Yeast infection"  . Oxycodone   . Amoxicillin Rash   I reviewed her past medical history, social history, family history, and environmental history and no significant changes have been reported from her previous visit.  Review of Systems  Constitutional: Negative for appetite change, chills, fever and unexpected weight change.  HENT: Negative for congestion and rhinorrhea.   Eyes: Positive for discharge, redness and itching. Negative for pain and visual disturbance.  Respiratory: Negative for cough, chest tightness, shortness of breath and wheezing.   Gastrointestinal: Negative for abdominal pain.  Skin: Negative for rash.  Allergic/Immunologic: Positive for environmental  allergies.  Neurological: Negative for headaches.   Objective: BP 120/67   Pulse 97   Temp 97.7 F (36.5 C) (Temporal)   Resp 18   Ht 5\' 6"  (1.676 m)   Wt 188 lb (85.3 kg)   SpO2 97%   BMI 30.34 kg/m  Body mass index is 30.34 kg/m. Physical Exam Vitals and nursing note reviewed.  Constitutional:      Appearance: Normal appearance. She is well-developed.  HENT:     Head: Normocephalic and atraumatic.     Right Ear: Tympanic membrane and external ear normal.     Left Ear: Tympanic membrane and external ear normal.     Nose: Nose normal.     Mouth/Throat:     Mouth: Mucous membranes are moist.     Pharynx: Oropharynx is clear.  Eyes:     Comments: Injected right eye. No discharge.  Cardiovascular:     Rate and Rhythm: Normal rate and regular rhythm.     Heart sounds: Normal heart sounds. No murmur heard.   Pulmonary:     Effort: Pulmonary effort is  normal.     Breath sounds: Normal breath sounds. No wheezing, rhonchi or rales.  Musculoskeletal:     Cervical back: Neck supple.  Skin:    General: Skin is warm.     Findings: No rash.  Neurological:     Mental Status: She is alert and oriented to person, place, and time.  Psychiatric:        Behavior: Behavior normal.    Previous notes and tests were reviewed. The plan was reviewed with the patient/family, and all questions/concerned were addressed.  It was my pleasure to see Audrianna today and participate in her care. Please feel free to contact me with any questions or concerns.  Sincerely,  Wyline Mood, DO Allergy & Immunology  [facility] office: [phone] [facility] office: [phone]

## 2020-03-25 NOTE — Patient Instructions (Addendum)
EYE  Recommend seeing an eye doctor as soon as possible.  Please only use refresh eye drops as needed.  Do not use any salt water in the eye for now.  Moderate persistent asthma . Daily controller medication(s): continue with Symbicort 161mcg 1-2 puffs 1-2 times a day.  . During upper respiratory infections/asthma flares: start Symbicort 179mcg 2 puffs twice a day for 1-2 weeks until your breathing symptoms return to baseline.  . May use albuterol rescue inhaler 2 puffs every 4 to 6 hours as needed for shortness of breath, chest tightness, coughing, and wheezing. May use albuterol rescue inhaler 2 puffs 5 to 15 minutes prior to strenuous physical activities. Monitor frequency of use.  . Asthma control goals:  o Full participation in all desired activities (may need albuterol before activity) o Albuterol use two times or less a week on average (not counting use with activity) o Cough interfering with sleep two times or less a month o Oral steroids no more than once a year o No hospitalizations  Perennial and seasonal rhinitis (grasses, weeds, molds, dust mites)  May use Flonase (fluticasone) nasal spray 1 spray per nostril twice a day as needed for nasal congestion.   Nasal saline spray (i.e., Simply Saline) or nasal saline lavage (i.e., NeilMed) is recommended as needed and prior to medicated nasal sprays.  May use over the counter antihistamines such as Zyrtec (cetirizine), Claritin (loratadine), Allegra (fexofenadine), or Xyzal (levocetirizine) daily as needed.  Follow up with Dr. Ernst Bowler in 2 months for the asthma and allergic rhinitis.

## 2020-03-25 NOTE — Assessment & Plan Note (Signed)
Stable.  May use Flonase (fluticasone) nasal spray 1 spray per nostril twice a day as needed for nasal congestion.   Nasal saline spray (i.e., Simply Saline) or nasal saline lavage (i.e., NeilMed) is recommended as needed and prior to medicated nasal sprays.  May use over the counter antihistamines such as Zyrtec (cetirizine), Claritin (loratadine), Allegra (fexofenadine), or Xyzal (levocetirizine) daily as needed.

## 2020-03-25 NOTE — Telephone Encounter (Signed)
Already scheduled for today.

## 2020-03-25 NOTE — Telephone Encounter (Signed)
Patient called stating she is needing to be seen since she believes she may have developed Pink eye. Patient was being seen by Dr. 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

## 2020-03-25 NOTE — Assessment & Plan Note (Signed)
Only using Symbicort as needed but takes montelukast daily. . Daily controller medication(s): continue with Symbicort 156mcg 1-2 puffs 1-2 times a day.  . During upper respiratory infections/asthma flares: start Symbicort 145mcg 2 puffs twice a day for 1-2 weeks until your breathing symptoms return to baseline.  . May use albuterol rescue inhaler 2 puffs every 4 to 6 hours as needed for shortness of breath, chest tightness, coughing, and wheezing. May use albuterol rescue inhaler 2 puffs 5 to 15 minutes prior to strenuous physical activities. Monitor frequency of use.  . Did not get spirometry today due to her eye issues and did not want to increase intraocular pressure.  . Get spirometry at next visit.

## 2020-03-26 ENCOUNTER — Ambulatory Visit
Admission: RE | Admit: 2020-03-26 | Discharge: 2020-03-26 | Disposition: A | Discharge status: Home / Self Care | Payer: Medicare PPO | Source: Ambulatory Visit | Attending: Family | Admitting: Family

## 2020-03-26 ENCOUNTER — Ambulatory Visit: Payer: Medicare PPO | Admitting: Allergy & Immunology

## 2020-03-26 DIAGNOSIS — Z1231 Encounter for screening mammogram for malignant neoplasm of breast: Secondary | ICD-10-CM | POA: Diagnosis not present

## 2020-03-26 DIAGNOSIS — Z0000 Encounter for general adult medical examination without abnormal findings: Secondary | ICD-10-CM

## 2020-04-15 DIAGNOSIS — H2513 Age-related nuclear cataract, bilateral: Secondary | ICD-10-CM | POA: Diagnosis not present

## 2020-04-15 DIAGNOSIS — H5213 Myopia, bilateral: Secondary | ICD-10-CM | POA: Diagnosis not present

## 2020-04-15 DIAGNOSIS — H524 Presbyopia: Secondary | ICD-10-CM | POA: Diagnosis not present

## 2020-05-14 ENCOUNTER — Telehealth: Payer: Self-pay

## 2020-05-14 NOTE — Telephone Encounter (Signed)
caller not sure what day or time her appt is for this month and asking the office to check and call her back Additional Comment patient asked that the office text her as she will be in testing today and cant pick up the phone and it cant ring, she's asking to please text her this information  Telephone: [phone]

## 2020-05-27 ENCOUNTER — Ambulatory Visit: Payer: Medicare PPO | Admitting: Family Medicine

## 2020-05-28 ENCOUNTER — Other Ambulatory Visit: Payer: Self-pay

## 2020-05-28 ENCOUNTER — Encounter: Payer: Self-pay | Admitting: Family Medicine

## 2020-05-28 ENCOUNTER — Ambulatory Visit: Payer: Medicare PPO | Admitting: Family Medicine

## 2020-05-28 ENCOUNTER — Other Ambulatory Visit (HOSPITAL_COMMUNITY): Payer: Self-pay | Admitting: Family Medicine

## 2020-05-28 VITALS — BP 120/68 | HR 79 | Temp 98.6°F | Resp 16 | Wt 187.6 lb

## 2020-05-28 DIAGNOSIS — N281 Cyst of kidney, acquired: Secondary | ICD-10-CM

## 2020-05-28 DIAGNOSIS — E782 Mixed hyperlipidemia: Secondary | ICD-10-CM | POA: Diagnosis not present

## 2020-05-28 DIAGNOSIS — J302 Other seasonal allergic rhinitis: Secondary | ICD-10-CM | POA: Diagnosis not present

## 2020-05-28 DIAGNOSIS — K219 Gastro-esophageal reflux disease without esophagitis: Secondary | ICD-10-CM

## 2020-05-28 DIAGNOSIS — B354 Tinea corporis: Secondary | ICD-10-CM

## 2020-05-28 DIAGNOSIS — J4520 Mild intermittent asthma, uncomplicated: Secondary | ICD-10-CM

## 2020-05-28 DIAGNOSIS — E669 Obesity, unspecified: Secondary | ICD-10-CM

## 2020-05-28 DIAGNOSIS — E1169 Type 2 diabetes mellitus with other specified complication: Secondary | ICD-10-CM | POA: Diagnosis not present

## 2020-05-28 DIAGNOSIS — J3089 Other allergic rhinitis: Secondary | ICD-10-CM

## 2020-05-28 MED ORDER — FLUCONAZOLE 150 MG PO TABS: 150.0000 mg | ORAL_TABLET | ORAL | 1 refills | Status: DC

## 2020-05-28 MED ORDER — NYSTATIN 100000 UNIT/GM EX CREA: 1.0000 "application " | TOPICAL_CREAM | Freq: Two times a day (BID) | CUTANEOUS | 2 refills | Status: DC

## 2020-05-28 MED FILL — NYSTATIN 100,000 UNIT/GM CR: 100000 | 15 days supply | Qty: 30 | Fill #0

## 2020-05-28 MED FILL — FLUCONAZOLE 150 MG TABS: 150 | 7 days supply | Qty: 2 | Fill #0

## 2020-05-28 NOTE — Patient Instructions (Signed)
Skin Yeast Infection  A skin yeast infection is a condition in which there is an overgrowth of yeast (candida) that normally lives on the skin. This condition usually occurs in areas of the skin that are constantly warm and moist, such as the armpits or the groin. What are the causes? This condition is caused by a change in the normal balance of the yeast and bacteria that live on the skin. What increases the risk? You are more likely to develop this condition if you:  Are obese.  Are pregnant.  Take birth control pills.  Have diabetes.  Take antibiotic medicines.  Take steroid medicines.  Are malnourished.  Have a weak body defense system (immune system).  Are 77 years of age or older.  Wear tight clothing. What are the signs or symptoms? The most common symptom of this condition is itchiness in the affected area. Other symptoms include:  Red, swollen area of the skin.  Bumps on the skin. How is this diagnosed?  This condition is diagnosed with a medical history and physical exam.  Your health care provider may check for yeast by taking light scrapings of the skin to be viewed under a microscope. How is this treated? This condition is treated with medicine. Medicines may be prescribed or be available over the counter. The medicines may be:  Taken by mouth (orally).  Applied as a cream or powder to your skin. Follow these instructions at home:  Take or apply over-the-counter and prescription medicines only as told by your health care provider.  Maintain a healthy weight. If you need help losing weight, talk with your health care provider.  Keep your skin clean and dry.  If you have diabetes, keep your blood sugar under control.  Keep all follow-up visits as told by your health care provider. This is important.   Contact a health care provider if:  Your symptoms go away and then return.  Your symptoms do not get better with treatment.  Your symptoms get  worse.  Your rash spreads.  You have a fever or chills.  You have new symptoms.  You have new warmth or redness of your skin. Summary  A skin yeast infection is a condition in which there is an overgrowth of yeast (candida) that normally lives on the skin. This condition is caused by a change in the normal balance of the yeast and bacteria that live on the skin.  Take or apply over-the-counter and prescription medicines only as told by your health care provider.  Keep your skin clean and dry.  Contact a health care provider if your symptoms do not get better with treatment. This information is not intended to replace advice given to you by your health care provider. Make sure you discuss any questions you have with your health care provider. Document Revised: 08/17/2017 Document Reviewed: 08/17/2017 Elsevier Patient Education  2021 [place]. Rash, Adult A rash is a change in the color of your skin. A rash can also change the way your skin feels. There are many different conditions and factors that can cause a rash. Some rashes may disappear after a few days, but some may last for a few weeks. Common causes of rashes include:  Viral infections, such as: ? Colds. ? Measles. ? Hand, foot, and mouth disease.  Bacterial infections, such as: ? Scarlet fever. ? Impetigo.  Fungal infections, such as Candida.  Allergic reactions to food, medicines, or skin care products. Follow these instructions at home: The  goal of treatment is to stop the itching and keep the rash from spreading. Pay attention to any changes in your symptoms. Follow these instructions to help with your condition: Medicine Take or apply over-the-counter and prescription medicines only as told by your health care provider. These may include:  Corticosteroid creams to treat red or swollen skin.  Anti-itch lotions.  Oral allergy medicines (antihistamines).  Oral corticosteroids for severe symptoms.   Skin  care  Apply cool compresses to the affected areas.  Do not scratch or rub your skin.  Avoid covering the rash. Make sure the rash is exposed to air as much as possible. Managing itching and discomfort  Avoid hot showers or baths, which can make itching worse. A cold shower may help.  Try taking a bath with: ? Epsom salts. Follow manufacturer instructions on the packaging. You can get these at your local pharmacy or grocery store. ? Baking soda. Pour a small amount into the bath as told by your health care provider. ? Colloidal oatmeal. Follow manufacturer instructions on the packaging. You can get this at your local pharmacy or grocery store.  Try applying baking soda paste to your skin. Stir water into baking soda until it reaches a paste-like consistency.  Try applying calamine lotion. This is an over-the-counter lotion that helps to relieve itchiness.  Keep cool and out of the sun. Sweating and being hot can make itching worse. General instructions  Rest as needed.  Drink enough fluid to keep your urine pale yellow.  Wear loose-fitting clothing.  Avoid scented soaps, detergents, and perfumes. Use gentle soaps, detergents, perfumes, and other cosmetic products.  Avoid any substance that causes your rash. Keep a journal to help track what causes your rash. Write down: ? What you eat. ? What cosmetic products you use. ? What you drink. ? What you wear. This includes jewelry.  Keep all follow-up visits as told by your health care provider. This is important.   Contact a health care provider if:  You sweat at night.  You lose weight.  You urinate more than normal.  You urinate less than normal, or you notice that your urine is a darker color than usual.  You feel weak.  You vomit.  Your skin or the whites of your eyes look yellow (jaundice).  Your skin: ? Tingles. ? Is numb.  Your rash: ? Does not go away after several days. ? Gets worse.  You  are: ? Unusually thirsty. ? More tired than normal.  You have: ? New symptoms. ? Pain in your abdomen. ? A fever. ? Diarrhea. Get help right away if you:  Have a fever and your symptoms suddenly get worse.  Develop confusion.  Have a severe headache or a stiff neck.  Have severe joint pains or stiffness.  Have a seizure.  Develop a rash that covers all or most of your body. The rash may or may not be painful.  Develop blisters that: ? Are on top of the rash. ? Grow larger or grow together. ? Are painful. ? Are inside your nose or mouth.  Develop a rash that: ? Looks like purple pinprick-sized spots all over your body. ? Has a "bull's eye" or looks like a target. ? Is not related to sun exposure, is red and painful, and causes your skin to peel. Summary  A rash is a change in the color of your skin. Some rashes disappear after a few days, but some may last for a  few weeks.  The goal of treatment is to stop the itching and keep the rash from spreading.  Take or apply over-the-counter and prescription medicines only as told by your health care provider.  Contact a health care provider if you have new or worsening symptoms.  Keep all follow-up visits as told by your health care provider. This is important. This information is not intended to replace advice given to you by your health care provider. Make sure you discuss any questions you have with your health care provider. Document Revised: 07/22/2018 Document Reviewed: 11/01/2017 Elsevier Patient Education  2021 [employer].

## 2020-05-29 DIAGNOSIS — B354 Tinea corporis: Secondary | ICD-10-CM | POA: Insufficient documentation

## 2020-05-29 DIAGNOSIS — L309 Dermatitis, unspecified: Secondary | ICD-10-CM | POA: Insufficient documentation

## 2020-05-29 NOTE — Assessment & Plan Note (Signed)
Improved control on current meds. 

## 2020-05-29 NOTE — Assessment & Plan Note (Signed)
In gluteal cleft. Started on Diflucan 150 mg tabs, 1 tab po weekly x 2 weeks. Also Nystatin cream to apply bid

## 2020-05-29 NOTE — Progress Notes (Signed)
Subjective:    Patient ID: Vicki Dennis, female    DOB: [birth year], 75 y.o.   MRN: [id number]  Chief Complaint  Patient presents with  . Follow-up    HPI Patient is in today for follow up on chronic medical concerns. No recent febrile illness or hospitalizations. 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 She has a rash on her back side at gluteal cleft that is pruritic at times. No other acute concerns. She is following with opthamology Dr Nile Riggs. Denies CP/palp/SOB/HA/congestion/fevers/GI or GU c/o. Taking meds as prescribed  Past Medical History:  Diagnosis Date  . Allergy   . Anemia    h/o  . Asthma   . Asthma, mild intermittent, well-controlled 02/10/2014  . Blood transfusion without reported diagnosis   . Chicken pox as a child  . Diabetes mellitus    type-II, per Dr. Parke Simmers, no meds. , pt. reports weight loss has contributed  to control of diabetes.  She reports last HgbA1c- wnl.  . Diabetes mellitus 11/05/2010  . Diabetes mellitus type 2 in obese (HCC) 11/05/2010  . Diabetes mellitus type 2, uncontrolled (HCC) 11/05/2010  . Esophageal reflux 02/10/2014  . GERD (gastroesophageal reflux disease)    rare use of OTC aid  . Hyperglycemia 09/02/2015  . Hyperlipidemia   . Hypertension   . Measles as a child  . Medicare annual wellness visit, subsequent 08/06/2014  . Mumps as a child  . Obesity 02/10/2014  . Pneumonia    as a child  . Portal vein thrombosis 03/09/2011   coumadin ended summer of 2013  . Preventative health care 08/12/2014  . Rash and nonspecific skin eruption 01/07/2015  . Seborrheic keratosis 02/10/2014   Follows with dermatology  . Sinusitis, acute 03/23/2016    Past Surgical History:  Procedure Laterality Date  . BREAST SURGERY     benign - cysts  . INSERTION OF MESH  03/16/2012   Procedure: INSERTION OF MESH;  Surgeon: Shelly Rubenstein, MD;  Location: [facility] OR;  Service: General;  Laterality: N/A;  . KNEE ARTHROSCOPY W/ MENISCAL  REPAIR     both knees  . TONSILLECTOMY    . UMBILICAL HERNIA REPAIR  03/16/2012   Procedure: HERNIA REPAIR UMBILICAL ADULT;  Surgeon: Shelly Rubenstein, MD;  Location: [facility] OR;  Service: General;  Laterality: N/A;    Family History  Problem Relation Age of Onset  . Cancer Father        prostate  . Aneurysm Mother 82       brain  . Cancer Maternal Aunt     Social History   Socioeconomic History  . Marital status: Divorced    Spouse name: Not on file  . Number of children: Not on file  . Years of education: Not on file  . Highest education level: Not on file  Occupational History  . Not on file  Tobacco Use  . Smoking status: Never Smoker  . Smokeless tobacco: Never Used  . Tobacco comment: never used tobacco  Vaping Use  . Vaping Use: Never used  Substance and Sexual Activity  . Alcohol use: Yes    Alcohol/week: 0.0 standard drinks    Comment: for holidays  . Drug use: No  . Sexual activity: Not Currently    Comment: lives alone   Other Topics Concern  . Not on file  Social History Narrative   Retired Runner, broadcasting/film/video   Social Determinants of Corporate investment banker Strain:  Not on file  Food Insecurity: Not on file  Transportation Needs: Not on file  Physical Activity: Not on file  Stress: Not on file  Social Connections: Not on file  Intimate Partner Violence: Not on file    Outpatient Medications Prior to Visit  Medication Sig Dispense Refill  . ACCU-CHEK AVIVA PLUS test strip USE TO CHECK BLOOD SUGAR ONCE DAILY AS DIRECTED 100 each 1  . albuterol (PROVENTIL HFA) 108 (90 Base) MCG/ACT inhaler Inhale 2 puffs into the lungs every 4 (four) hours as needed for wheezing or shortness of breath. 18 g 1  . blood glucose meter kit and supplies KIT Dispense based on patient and insurance preference. Use up to four times daily as directed. (FOR ICD-9 250.00, 250.01). 1 each 0  . budesonide-formoterol (SYMBICORT) 160-4.5 MCG/ACT inhaler Inhale 2 puffs into the lungs 2 (two)  times daily. 1 Inhaler 5  . enalapril (VASOTEC) 2.5 MG tablet TAKE ONE TABLET BY MOUTH ONE TIME DAILY 90 tablet 0  . EPIPEN 2-PAK 0.3 MG/0.3ML SOAJ injection Use as directed for severe allergic reactions. 2 Device 2  . fluticasone (FLONASE) 50 MCG/ACT nasal spray Place 2 sprays into both nostrils daily. 16 g 5  . Lancets 30G MISC Use as directed once daily to check blood sugar.  Diagnosis code E11.9 100 each 5  . montelukast (SINGULAIR) 10 MG tablet Take 1 tablet (10 mg total) by mouth at bedtime. 90 tablet 5  . Multiple Vitamins-Minerals (MULTIVITAMIN WITH MINERALS) tablet Take 1 tablet by mouth daily.    . Omega-3 Fatty Acids (FISH OIL CONCENTRATE) 1000 MG CAPS Take by mouth 3 (three) times daily.    Marland Kitchen aspirin 81 MG tablet Take 81 mg by mouth daily.     Marland Kitchen ketotifen (ZADITOR) 0.025 % ophthalmic solution Place 1 drop into both eyes 2 (two) times daily. (Patient taking differently: Place 1 drop into both eyes 2 (two) times daily as needed.) 5 mL 0   No facility-administered medications prior to visit.    Allergies  Allergen Reactions  . Codeine Other (See Comments)    "makes me crazy"  . Sulfa Antibiotics Anaphylaxis    "died 3 times".  . Doxycycline Other (See Comments)    "Yeast infection"  . Oxycodone   . Amoxicillin Rash    Review of Systems  Constitutional: Negative for fever and malaise/fatigue.  HENT: Negative for congestion.   Eyes: Negative for blurred vision.  Respiratory: Negative for shortness of breath.   Cardiovascular: Negative for chest pain, palpitations and leg swelling.  Gastrointestinal: Negative for abdominal pain, blood in stool and nausea.  Genitourinary: Negative for dysuria and frequency.  Musculoskeletal: Negative for falls.  Skin: Positive for rash.  Neurological: Negative for dizziness, loss of consciousness and headaches.  Endo/Heme/Allergies: Negative for environmental allergies.  Psychiatric/Behavioral: Negative for depression. The patient is not  nervous/anxious.        Objective:    Physical Exam Vitals and nursing note reviewed.  Constitutional:      General: She is not in acute distress.    Appearance: She is well-developed and well-nourished.  HENT:     Head: Normocephalic and atraumatic.     Nose: Nose normal.  Eyes:     General:        Right eye: No discharge.        Left eye: No discharge.  Cardiovascular:     Rate and Rhythm: Normal rate and regular rhythm.     Heart sounds: No murmur  heard.   Pulmonary:     Effort: Pulmonary effort is normal.     Breath sounds: Normal breath sounds.  Abdominal:     General: Bowel sounds are normal.     Palpations: Abdomen is soft.     Tenderness: There is no abdominal tenderness.  Musculoskeletal:        General: No edema.     Cervical back: Normal range of motion and neck supple.  Skin:    General: Skin is warm and dry.  Neurological:     Mental Status: She is alert and oriented to person, place, and time.  Psychiatric:        Mood and Affect: Mood and affect normal.     BP 120/68   Pulse 79   Temp 98.6 F (37 C)   Resp 16   Wt 187 lb 9.6 oz (85.1 kg)   SpO2 99%   BMI 30.28 kg/m  Wt Readings from Last 3 Encounters:  05/28/20 187 lb 9.6 oz (85.1 kg)  03/25/20 188 lb (85.3 kg)  01/24/20 191 lb (86.6 kg)    Diabetic Foot Exam - Simple   No data filed    Lab Results  Component Value Date   WBC 5.9 04/20/2019   HGB 14.0 04/20/2019   HCT 42.7 04/20/2019   PLT 188.0 04/20/2019   GLUCOSE 115 (H) 01/24/2020   CHOL 208 (H) 01/24/2020   TRIG 117 01/24/2020   HDL 71 01/24/2020   LDLDIRECT 111.2 02/06/2014   LDLCALC 115 (H) 01/24/2020   ALT 14 01/24/2020   AST 16 01/24/2020   NA 140 01/24/2020   K 4.7 01/24/2020   CL 106 01/24/2020   CREATININE 0.64 01/24/2020   BUN 16 01/24/2020   CO2 25 01/24/2020   TSH 1.06 04/20/2019   INR 2.2 09/21/2011   HGBA1C 6.7 (H) 01/24/2020   MICROALBUR 2.1 (H) 08/13/2017    Lab Results  Component Value Date    TSH 1.06 04/20/2019   Lab Results  Component Value Date   WBC 5.9 04/20/2019   HGB 14.0 04/20/2019   HCT 42.7 04/20/2019   MCV 90.3 04/20/2019   PLT 188.0 04/20/2019   Lab Results  Component Value Date   NA 140 01/24/2020   K 4.7 01/24/2020   CHLORIDE 110 (H) 10/10/2015   CO2 25 01/24/2020   GLUCOSE 115 (H) 01/24/2020   BUN 16 01/24/2020   CREATININE 0.64 01/24/2020   BILITOT 0.4 01/24/2020   ALKPHOS 60 04/20/2019   AST 16 01/24/2020   ALT 14 01/24/2020   PROT 6.2 01/24/2020   ALBUMIN 4.1 04/20/2019   CALCIUM 9.2 01/24/2020   ANIONGAP 8 10/10/2015   EGFR 86 (L) 10/10/2015   GFR 100.12 04/20/2019   Lab Results  Component Value Date   CHOL 208 (H) 01/24/2020   Lab Results  Component Value Date   HDL 71 01/24/2020   Lab Results  Component Value Date   LDLCALC 115 (H) 01/24/2020   Lab Results  Component Value Date   TRIG 117 01/24/2020   Lab Results  Component Value Date   CHOLHDL 2.9 01/24/2020   Lab Results  Component Value Date   HGBA1C 6.7 (H) 01/24/2020       Assessment & Plan:   Problem List Items Addressed This Visit    Diabetes mellitus type 2 in obese (HCC) - Primary    hgba1c acceptable, minimize simple carbs. Increase exercise as tolerated.       Relevant Orders  Hemoglobin A1c   CBC   Comprehensive metabolic panel   TSH   Hyperlipidemia    Encouraged heart healthy diet, increase exercise, avoid trans fats, consider a krill oil cap daily      Relevant Orders   CBC   Lipid panel   TSH   Gastroesophageal reflux disease    Avoid offending foods, start probiotics. Do not eat large meals in late evening and consider raising head of bed.       Asthma, mild intermittent, well-controlled    Improved control on current meds.       Seasonal and perennial allergic rhinitis   Relevant Orders   CBC   TSH   Renal cyst   Relevant Orders   CBC   TSH   Tinea corporis    In gluteal cleft. Started on Diflucan 150 mg tabs, 1 tab po  weekly x 2 weeks. Also Nystatin cream to apply bid      Relevant Medications   nystatin cream (MYCOSTATIN)   fluconazole (DIFLUCAN) 150 MG tablet      I have discontinued Fransico Him. Yingst's aspirin and ketotifen. I am also having her start on nystatin cream and fluconazole. Additionally, I am having her maintain her multivitamin with minerals, Fish Oil Concentrate, Lancets 30G, EpiPen 2-Pak, albuterol, fluticasone, montelukast, budesonide-formoterol, Accu-Chek Aviva Plus, enalapril, and blood glucose meter kit and supplies.  Meds ordered this encounter  Medications  . nystatin cream (MYCOSTATIN)    Sig: Apply 1 application topically 2 (two) times daily.    Dispense:  30 g    Refill:  2  . fluconazole (DIFLUCAN) 150 MG tablet    Sig: Take 1 tablet (150 mg total) by mouth once a week.    Dispense:  2 tablet    Refill:  1     Danise Edge, MD

## 2020-05-29 NOTE — Assessment & Plan Note (Signed)
Avoid offending foods, start probiotics. Do not eat large meals in late evening and consider raising head of bed.  

## 2020-05-29 NOTE — Assessment & Plan Note (Signed)
hgba1c acceptable, minimize simple carbs. Increase exercise as tolerated.  

## 2020-05-29 NOTE — Assessment & Plan Note (Signed)
Encouraged heart healthy diet, increase exercise, avoid trans fats, consider a krill oil cap daily 

## 2020-06-04 ENCOUNTER — Other Ambulatory Visit: Payer: Medicare PPO

## 2020-06-10 ENCOUNTER — Other Ambulatory Visit: Payer: Self-pay

## 2020-06-10 ENCOUNTER — Other Ambulatory Visit (INDEPENDENT_AMBULATORY_CARE_PROVIDER_SITE_OTHER): Payer: Medicare PPO

## 2020-06-10 DIAGNOSIS — E669 Obesity, unspecified: Secondary | ICD-10-CM | POA: Diagnosis not present

## 2020-06-10 DIAGNOSIS — E1169 Type 2 diabetes mellitus with other specified complication: Secondary | ICD-10-CM | POA: Diagnosis not present

## 2020-06-10 DIAGNOSIS — N281 Cyst of kidney, acquired: Secondary | ICD-10-CM

## 2020-06-10 DIAGNOSIS — J3089 Other allergic rhinitis: Secondary | ICD-10-CM

## 2020-06-10 DIAGNOSIS — E782 Mixed hyperlipidemia: Secondary | ICD-10-CM | POA: Diagnosis not present

## 2020-06-10 DIAGNOSIS — J302 Other seasonal allergic rhinitis: Secondary | ICD-10-CM | POA: Diagnosis not present

## 2020-06-10 LAB — LIPID PANEL
Cholesterol: 243 mg/dL — ABNORMAL HIGH (ref 0–200)
HDL: 66.9 mg/dL (ref >39.00)
LDL Cholesterol: 159 mg/dL — ABNORMAL HIGH (ref 0–99)
NonHDL: 176.29
Total CHOL/HDL Ratio: 4
Triglycerides: 87 mg/dL (ref 0.0–149.0)
VLDL: 17.4 mg/dL (ref 0.0–40.0)

## 2020-06-10 LAB — COMPREHENSIVE METABOLIC PANEL
ALT: 11 U/L (ref 0–35)
AST: 15 U/L (ref 0–37)
Albumin: 4.1 g/dL (ref 3.5–5.2)
Alkaline Phosphatase: 52 U/L (ref 39–117)
BUN: 20 mg/dL (ref 6–23)
CO2: 25 mEq/L (ref 19–32)
Calcium: 9.6 mg/dL (ref 8.4–10.5)
Chloride: 103 mEq/L (ref 96–112)
Creatinine, Ser: 0.84 mg/dL (ref 0.40–1.20)
GFR: 67.21 mL/min (ref >60.00)
Glucose, Bld: 114 mg/dL — ABNORMAL HIGH (ref 70–99)
Potassium: 4.3 mEq/L (ref 3.5–5.1)
Sodium: 137 mEq/L (ref 135–145)
Total Bilirubin: 0.6 mg/dL (ref 0.2–1.2)
Total Protein: 6.4 g/dL (ref 6.0–8.3)

## 2020-06-10 LAB — CBC
HCT: 42.8 % (ref 36.0–46.0)
Hemoglobin: 14.2 g/dL (ref 12.0–15.0)
MCHC: 33.1 g/dL (ref 30.0–36.0)
MCV: 89.3 fl (ref 78.0–100.0)
Platelets: 190 10*3/uL (ref 150.0–400.0)
RBC: 4.79 Mil/uL (ref 3.87–5.11)
RDW: 14.2 % (ref 11.5–15.5)
WBC: 6.4 10*3/uL (ref 4.0–10.5)

## 2020-06-10 LAB — TSH: TSH: 1.45 u[IU]/mL (ref 0.35–4.50)

## 2020-06-10 LAB — HEMOGLOBIN A1C: Hgb A1c MFr Bld: 6.8 % — ABNORMAL HIGH (ref 4.6–6.5)

## 2020-06-11 ENCOUNTER — Ambulatory Visit: Payer: Medicare PPO | Admitting: Family Medicine

## 2020-06-12 ENCOUNTER — Encounter: Payer: Self-pay | Admitting: *Deleted

## 2020-06-13 ENCOUNTER — Other Ambulatory Visit: Payer: Self-pay | Admitting: Allergy & Immunology

## 2020-06-14 ENCOUNTER — Telehealth: Payer: Self-pay | Admitting: Family Medicine

## 2020-06-14 ENCOUNTER — Telehealth: Payer: Self-pay

## 2020-06-14 NOTE — Telephone Encounter (Signed)
Opened to print lab letter

## 2020-06-14 NOTE — Telephone Encounter (Signed)
Patient would like a copy of her lab work done on 06/10/20. She will be coming by today to get it.

## 2020-06-14 NOTE — Telephone Encounter (Signed)
Lab letter printed

## 2020-06-20 ENCOUNTER — Ambulatory Visit: Payer: Medicare PPO | Admitting: Family Medicine

## 2020-06-23 NOTE — Patient Instructions (Addendum)
Moderate persistent asthma Continue Symbicort 160/4.5 mcg 1-2 puffs 1-2 times a day to help prevent cough and wheeze May use albuterol 2 puffs every 4-6 hours as needed for cough,wheeze, tightness in chest, or shortness of breath. May also use albuterol 2 puffs 5-15 minutes prior to exercise Asthma control goals:   Full participation in all desired activities (may need albuterol before activity)  Albuterol use two time or less a week on average (not counting use with activity)  Cough interfering with sleep two time or less a month  Oral steroids no more than once a year  No hospitalizations  Seasonal and perennial allergic rhinitis (grasses, weeds, mold, and dust mite) Continue Flonase 1 spray each nostril once a day as needed for stuffy nose May use an over the counter antihistamine once a day as needed such as Zyrtec (cetirizine), Claritin (loratadine), Allegra (fexofenadine), or Xyzal (levocetrizine ).  Bug bites Start triamcinolone 0.1% ointment using 1 application twice a day as needed to red itchy areas. Do not use on face, neck, groin, or armpit region  Please let us know if this treatment plan is not working well for you Schedule a follow up appointment in 4 months

## 2020-06-24 ENCOUNTER — Encounter: Payer: Self-pay | Admitting: Family

## 2020-06-24 ENCOUNTER — Other Ambulatory Visit: Payer: Self-pay

## 2020-06-24 ENCOUNTER — Ambulatory Visit: Payer: Medicare PPO | Admitting: Family

## 2020-06-24 VITALS — BP 122/64 | HR 76 | Temp 97.8°F | Resp 17 | Ht 66.0 in | Wt 188.4 lb

## 2020-06-24 DIAGNOSIS — J3089 Other allergic rhinitis: Secondary | ICD-10-CM

## 2020-06-24 DIAGNOSIS — J302 Other seasonal allergic rhinitis: Secondary | ICD-10-CM | POA: Diagnosis not present

## 2020-06-24 DIAGNOSIS — J4540 Moderate persistent asthma, uncomplicated: Secondary | ICD-10-CM

## 2020-06-24 MED ORDER — TRIAMCINOLONE ACETONIDE 0.1 % EX OINT: TOPICAL_OINTMENT | CUTANEOUS | 0 refills | Status: DC

## 2020-06-24 MED ORDER — EPIPEN 2-PAK 0.3 MG/0.3ML IJ SOAJ: INTRAMUSCULAR | 2 refills | Status: DC

## 2020-06-24 NOTE — Progress Notes (Signed)
[address] [place] [address] Dept: [phone]  FOLLOW UP NOTE  Patient ID: Vicki Dennis, female    DOB: [birth year]  Age: 75 y.o. MRN: [id number] Date of Office Visit: 06/24/2020  Assessment  Chief Complaint: Allergic Rhinitis   HPI Vicki Dennis is a 75 year old female who presents today for follow-up of moderate persistent asthma, perennial and seasonal allergic rhinitis, and acute conjunctivitis of the right eye.  She was last seen on March 25, 2020 by Dr. Selena Batten.  Moderate persistent asthma is reported as moderately controlled with Symbicort 160/4.5 mcg 1 puff once a day, Mucinex as needed, and albuterol as needed.  She reports coughing due to drainage, shortness of breath at times, but reports this is not as bad.  She denies any wheezing.  Since her last office visit she has not required any systemic steroids or made any trips to the emergency room or urgent care due to breathing problems.  She has not had to use her albuterol inhaler since her last office visit.  Seasonal and perennial allergic rhinitis is reported as controlled with fluticasone nasal spray as needed and Benadryl as needed.  She reports postnasal drip at times and denies rhinorrhea, and nasal congestion. She has not had any sinus infections since we last saw her.  She reports 2 days ago she was stung by red ants on both legs.  She reports some swelling but denied any concomitant cardiorespiratory, gastrointestinal and cutaneous symptoms.  She has been using calamine lotion, lemon juice, and vinegar for the itching.  She does report that she has an EpiPen on hand because she is allergic to hornets.  She is requesting a refill on her EpiPen  Acute conjunctivitis of the right eye is reported as doing well.  She saw the eye doctor after seeing Dr. Selena Batten and reports that her redness in her eye was due to subconjunctival hemorrhage.   Drug Allergies:  Allergies  Allergen Reactions  . Codeine Other (See  Comments)    "makes me crazy"  . Sulfa Antibiotics Anaphylaxis    "died 3 times".  . Doxycycline Other (See Comments)    "Yeast infection"  . Oxycodone   . Amoxicillin Rash    Review of Systems: Review of Systems  Constitutional: Negative for chills.  HENT:       Reports post nasal drip at times and denies rhinorrhea and nasal congestion  Eyes:       Denies itchy watery eyes  Respiratory: Positive for cough and shortness of breath. Negative for wheezing.        Reports cough due to post nasal drip and also shortness of breath at times, but not as bad  Cardiovascular: Negative for chest pain and palpitations.  Gastrointestinal: Negative for abdominal pain and heartburn.  Genitourinary: Negative for dysuria.  Skin: Positive for itching.       Bit by red ants 2 days ago  Neurological: Negative for headaches.  Endo/Heme/Allergies: Positive for environmental allergies.    Physical Exam: BP 122/64 (BP Location: Left Arm, Patient Position: Sitting, Cuff Size: Normal)   Pulse 76   Temp 97.8 F (36.6 C) (Temporal)   Resp 17   Ht 5\' 6"  (1.676 m)   Wt 188 lb 6.4 oz (85.5 kg)   SpO2 96%   BMI 30.41 kg/m    Physical Exam Constitutional:      Appearance: Normal appearance.  HENT:     Head: Normocephalic and atraumatic.     Comments:  Pharynx normal. Eyes normal. Ears: unable to visualize right tympanic membrane to due cerumen. Left ear normal. Nose bilateral lower turbinate mildly edematous and slightly erythematous with no drainage noted    Right Ear: Ear canal and external ear normal.     Left Ear: Tympanic membrane, ear canal and external ear normal.     Mouth/Throat:     Mouth: Mucous membranes are moist.     Pharynx: Oropharynx is clear.  Eyes:     Conjunctiva/sclera: Conjunctivae normal.  Cardiovascular:     Rate and Rhythm: Regular rhythm.     Heart sounds: Normal heart sounds.  Pulmonary:     Effort: Pulmonary effort is normal.     Breath sounds: Normal breath  sounds.     Comments: Lungs clear to auscultation Skin:    General: Skin is warm.     Comments: Multiple bites noted on bilateral lower legs with calamine lotion on legs  Neurological:     Mental Status: She is alert and oriented to person, place, and time.  Psychiatric:        Mood and Affect: Mood normal.        Behavior: Behavior normal.        Thought Content: Thought content normal.        Judgment: Judgment normal.     Diagnostics: FVC 2.91 L, FEV1 2.29 L.  Predicted FVC 2.38 L, FEV1 1.84 L.  Spirometry indicates normal ventilatory function.  Assessment and Plan: 1. Moderate persistent asthma without complication   2. Seasonal and perennial allergic rhinitis     Meds ordered this encounter  Medications  . EPIPEN 2-PAK 0.3 MG/0.3ML SOAJ injection    Sig: Use as directed for severe allergic reactions.    Dispense:  2 each    Refill:  2  . triamcinolone ointment (KENALOG) 0.1 %    Sig: 1 application 2 times daily as needed to red itchy areas. Avoid face, neck, groin, and armpit.    Dispense:  80 g    Refill:  0    Patient Instructions  Moderate persistent asthma Continue Symbicort 160/4.5 mcg 1-2 puffs 1-2 times a day to help prevent cough and wheeze May use albuterol 2 puffs every 4-6 hours as needed for cough,wheeze, tightness in chest, or shortness of breath. May also use albuterol 2 puffs 5-15 minutes prior to exercise Asthma control goals:   Full participation in all desired activities (may need albuterol before activity)  Albuterol use two time or less a week on average (not counting use with activity)  Cough interfering with sleep two time or less a month  Oral steroids no more than once a year  No hospitalizations  Seasonal and perennial allergic rhinitis (grasses, weeds, mold, and dust mite) Continue Flonase 1 spray each nostril once a day as needed for stuffy nose May use an over the counter antihistamine once a day as needed such as Zyrtec  (cetirizine), Claritin (loratadine), Allegra (fexofenadine), or Xyzal (levocetrizine ).  Bug bites Start triamcinolone 0.1% ointment using 1 application twice a day as needed to red itchy areas. Do not use on face, neck, groin, or armpit region  Please let us know if this treatment plan is not working well for you Schedule a follow up appointment in 4 months   Return in about 4 months (around 10/24/2020), or if symptoms worsen or fail to improve.    Thank you for the opportunity to care for this patient.  Please do not hesitate to  contact me with questions.  Nehemiah Settle, FNP [facility]

## 2020-07-03 ENCOUNTER — Other Ambulatory Visit: Payer: Self-pay | Admitting: *Deleted

## 2020-07-03 MED ORDER — EPINEPHRINE 0.3 MG/0.3ML IJ SOAJ: 0.3000 mg | Freq: Once | INTRAMUSCULAR | 1 refills | Status: AC

## 2020-07-17 ENCOUNTER — Other Ambulatory Visit: Payer: Self-pay

## 2020-07-17 MED ORDER — EPIPEN 2-PAK 0.3 MG/0.3ML IJ SOAJ: INTRAMUSCULAR | 2 refills | Status: DC

## 2020-07-24 ENCOUNTER — Other Ambulatory Visit: Payer: Self-pay | Admitting: *Deleted

## 2020-07-24 MED ORDER — EPINEPHRINE 0.3 MG/0.3ML IJ SOAJ: 0.3000 mg | Freq: Once | INTRAMUSCULAR | 1 refills | Status: AC

## 2020-08-01 ENCOUNTER — Emergency Department (HOSPITAL_COMMUNITY)
Admission: EM | Admit: 2020-08-01 | Discharge: 2020-08-01 | Disposition: A | Discharge status: Home / Self Care | Payer: Medicare PPO | Attending: Emergency Medicine | Admitting: Emergency Medicine

## 2020-08-01 ENCOUNTER — Encounter (HOSPITAL_COMMUNITY): Payer: Self-pay

## 2020-08-01 ENCOUNTER — Other Ambulatory Visit: Payer: Self-pay

## 2020-08-01 ENCOUNTER — Emergency Department (HOSPITAL_COMMUNITY): Payer: Medicare PPO

## 2020-08-01 DIAGNOSIS — Z5333 Arthroscopic surgical procedure converted to open procedure: Secondary | ICD-10-CM | POA: Insufficient documentation

## 2020-08-01 DIAGNOSIS — I10 Essential (primary) hypertension: Secondary | ICD-10-CM | POA: Insufficient documentation

## 2020-08-01 DIAGNOSIS — J4520 Mild intermittent asthma, uncomplicated: Secondary | ICD-10-CM | POA: Insufficient documentation

## 2020-08-01 DIAGNOSIS — Z79899 Other long term (current) drug therapy: Secondary | ICD-10-CM | POA: Insufficient documentation

## 2020-08-01 DIAGNOSIS — E119 Type 2 diabetes mellitus without complications: Secondary | ICD-10-CM | POA: Diagnosis not present

## 2020-08-01 DIAGNOSIS — S86912A Strain of unspecified muscle(s) and tendon(s) at lower leg level, left leg, initial encounter: Secondary | ICD-10-CM | POA: Insufficient documentation

## 2020-08-01 DIAGNOSIS — M25562 Pain in left knee: Secondary | ICD-10-CM | POA: Diagnosis not present

## 2020-08-01 DIAGNOSIS — S80912A Unspecified superficial injury of left knee, initial encounter: Secondary | ICD-10-CM | POA: Diagnosis present

## 2020-08-01 DIAGNOSIS — Y92410 Unspecified street and highway as the place of occurrence of the external cause: Secondary | ICD-10-CM | POA: Insufficient documentation

## 2020-08-01 DIAGNOSIS — Z7952 Long term (current) use of systemic steroids: Secondary | ICD-10-CM | POA: Diagnosis not present

## 2020-08-01 NOTE — ED Notes (Signed)
An After Visit Summary was printed and given to the patient. Discharge instructions given and no further questions at this time.  
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
## 2020-08-01 NOTE — Discharge Instructions (Signed)
Follow-up with your primary doctor.  Take Tylenol or Motrin as needed for pain control.  If you develop abdominal pain, chest pain, difficulty breathing, uncontrolled knee pain, inability to walk, return to ER for reassessment.

## 2020-08-07 ENCOUNTER — Other Ambulatory Visit: Payer: Self-pay

## 2020-08-07 ENCOUNTER — Ambulatory Visit: Payer: Medicare PPO | Admitting: Family

## 2020-08-07 ENCOUNTER — Ambulatory Visit: Payer: Medicare PPO | Admitting: Medical

## 2020-08-07 ENCOUNTER — Telehealth: Payer: Self-pay

## 2020-08-07 VITALS — BP 148/74 | HR 60 | Temp 98.5°F | Resp 16 | Ht 66.0 in | Wt 190.4 lb

## 2020-08-07 DIAGNOSIS — M25562 Pain in left knee: Secondary | ICD-10-CM | POA: Diagnosis not present
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
## 2020-08-07 NOTE — Telephone Encounter (Signed)
Called pt to see if she needed a refill on her epi pen because [name] sent in a refill request for it.

## 2020-08-07 NOTE — Patient Instructions (Signed)
Continue tylenol as needed for pain. You should be contacted about scheduling your appointment with the orthopedic doctor.

## 2020-08-12 ENCOUNTER — Encounter: Payer: Self-pay | Admitting: Orthopedic Surgery

## 2020-08-12 ENCOUNTER — Ambulatory Visit: Payer: Medicare PPO | Admitting: Physician Assistant

## 2020-08-12 ENCOUNTER — Telehealth: Payer: Self-pay | Admitting: Physician Assistant

## 2020-08-12 ENCOUNTER — Other Ambulatory Visit: Payer: Self-pay | Admitting: *Deleted

## 2020-08-12 DIAGNOSIS — M25562 Pain in left knee: Secondary | ICD-10-CM | POA: Diagnosis not present

## 2020-08-12 MED ORDER — MELOXICAM 15 MG PO TABS: 15.0000 mg | ORAL_TABLET | Freq: Every day | ORAL | 0 refills | Status: DC

## 2020-08-12 MED ORDER — EPIPEN 2-PAK 0.3 MG/0.3ML IJ SOAJ: INTRAMUSCULAR | 2 refills | Status: DC
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
## 2020-08-12 NOTE — Telephone Encounter (Signed)
Patient called requesting a call back from PA Pers[employer]. She states a note for work stating how long she will be out of work. Please call patient at [phone].

## 2020-08-13 ENCOUNTER — Telehealth: Payer: Self-pay | Admitting: Physician Assistant

## 2020-08-13 ENCOUNTER — Other Ambulatory Visit: Payer: Self-pay

## 2020-08-13 NOTE — Telephone Encounter (Signed)
This is a duplicate message,

## 2020-08-13 NOTE — Telephone Encounter (Signed)
Pt needs a note for her not working at this time. Cb [phone]

## 2020-08-13 NOTE — Telephone Encounter (Signed)
I called and sw pt and advised that letter is written for her to remain out of work until her appt 08/30/20 will discuss return to work plan at that time. Letter is ready for pick up at the front desk.

## 2020-08-26 ENCOUNTER — Telehealth: Payer: Self-pay | Admitting: *Deleted

## 2020-08-26 MED ORDER — EPINEPHRINE 0.3 MG/0.3ML IJ SOSY: PREFILLED_SYRINGE | INTRAMUSCULAR | 1 refills | Status: DC

## 2020-08-26 NOTE — Telephone Encounter (Signed)
Received prior auth for brand Epi Pen.  Generic is covered.  Was unsure of why brand name was given in first place. Called patient and she was not sure why either and was ok sending in generic.

## 2020-08-30 ENCOUNTER — Encounter: Payer: Self-pay | Admitting: Physician Assistant

## 2020-08-30 ENCOUNTER — Ambulatory Visit: Payer: Medicare PPO | Admitting: Physician Assistant

## 2020-08-30 DIAGNOSIS — M25562 Pain in left knee: Secondary | ICD-10-CM | POA: Diagnosis not present
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
## 2020-09-23 ENCOUNTER — Other Ambulatory Visit: Payer: Self-pay | Admitting: Allergy

## 2020-10-17 ENCOUNTER — Encounter: Payer: Self-pay | Admitting: Family Medicine

## 2020-10-17 ENCOUNTER — Ambulatory Visit: Payer: Medicare PPO | Admitting: Family Medicine

## 2020-10-17 ENCOUNTER — Other Ambulatory Visit: Payer: Self-pay

## 2020-10-17 VITALS — BP 136/80 | HR 72 | Temp 98.0°F | Resp 16 | Ht 66.0 in | Wt 193.4 lb

## 2020-10-17 DIAGNOSIS — J302 Other seasonal allergic rhinitis: Secondary | ICD-10-CM | POA: Diagnosis not present

## 2020-10-17 DIAGNOSIS — H1010 Acute atopic conjunctivitis, unspecified eye: Secondary | ICD-10-CM | POA: Insufficient documentation

## 2020-10-17 DIAGNOSIS — H1013 Acute atopic conjunctivitis, bilateral: Secondary | ICD-10-CM

## 2020-10-17 DIAGNOSIS — J4540 Moderate persistent asthma, uncomplicated: Secondary | ICD-10-CM | POA: Diagnosis not present

## 2020-10-17 DIAGNOSIS — J3089 Other allergic rhinitis: Secondary | ICD-10-CM

## 2020-10-17 MED ORDER — BUDESONIDE-FORMOTEROL FUMARATE 160-4.5 MCG/ACT IN AERO: 2.0000 | INHALATION_SPRAY | Freq: Two times a day (BID) | RESPIRATORY_TRACT | 5 refills | Status: DC

## 2020-10-17 MED ORDER — MONTELUKAST SODIUM 10 MG PO TABS: 10.0000 mg | ORAL_TABLET | Freq: Every day | ORAL | 5 refills | Status: AC

## 2020-10-17 MED ORDER — AZELASTINE HCL 0.1 % NA SOLN: 2.0000 | Freq: Two times a day (BID) | NASAL | 5 refills | Status: DC

## 2020-10-17 MED ORDER — ALBUTEROL SULFATE HFA 108 (90 BASE) MCG/ACT IN AERS: 2.0000 | INHALATION_SPRAY | RESPIRATORY_TRACT | 1 refills | Status: DC | PRN

## 2020-10-17 MED ORDER — FLUTICASONE PROPIONATE 50 MCG/ACT NA SUSP: 2.0000 | Freq: Every day | NASAL | 5 refills | Status: DC

## 2020-10-17 NOTE — Patient Instructions (Signed)
Moderate persistent asthma Continue Symbicort 160/4.5 mcg 1-2 puffs 1-2 times a day to help prevent cough and wheeze May use albuterol 2 puffs every 4-6 hours as needed for cough,wheeze, tightness in chest, or shortness of breath. May also use albuterol 2 puffs 5-15 minutes prior to exercise Asthma control goals:  Full participation in all desired activities (may need albuterol before activity) Albuterol use two time or less a week on average (not counting use with activity) Cough interfering with sleep two time or less a month Oral steroids no more than once a year No hospitalizations  Seasonal and perennial allergic rhinitis (grasses, weeds, mold, and dust mite) Continue allergen avoidance measures as listed below Continue saline nasal rinses as needed for nasal symptoms. Use this before any medicated nasal sprays for best result Continue Flonase 2 sprays in each nostril once a day Begin azelastine nasal spray 2 sprays in each nostril twice a day to help with drainage Begin prednisone 10 mg tablets. Take 2 tablets twice a day for 3 days, then take 2 tablets once a day for 1 day, then take 1 tablet on the 5th day, then stop  After one week, begin cetirizine 10 mg once a day as needed for a runny nose Call the clinic if your symptoms worsen or do not improve  Allergic conjunctivitis Begin olopatadine eye drops one drop in each eye once a day as needed for red or itchy eyes  Call the clinic if this treatment plan is not working well for you  Follow up in 1 month or sooner if needed.  Reducing Pollen Exposure The American Academy of Allergy, Asthma and Immunology suggests the following steps to reduce your exposure to pollen during allergy seasons. Do not hang sheets or clothing out to dry; pollen may collect on these items. Do not mow lawns or spend time around freshly cut grass; mowing stirs up pollen. Keep windows closed at night.  Keep car windows closed while driving. Minimize  morning activities outdoors, a time when pollen counts are usually at their highest. Stay indoors as much as possible when pollen counts or humidity is high and on windy days when pollen tends to remain in the air longer. Use air conditioning when possible.  Many air conditioners have filters that trap the pollen spores. Use a HEPA room air filter to remove pollen form the indoor air you breathe.  Control of Mold Allergen Mold and fungi can grow on a variety of surfaces provided certain temperature and moisture conditions exist.  Outdoor molds grow on plants, decaying vegetation and soil.  The major outdoor mold, Alternaria and Cladosporium, are found in very high numbers during hot and dry conditions.  Generally, a late Summer - Fall peak is seen for common outdoor fungal spores.  Rain will temporarily lower outdoor mold spore count, but counts rise rapidly when the rainy period ends.  The most important indoor molds are Aspergillus and Penicillium.  Dark, humid and poorly ventilated basements are ideal sites for mold growth.  The next most common sites of mold growth are the bathroom and the kitchen.  Outdoor [employer] Use air conditioning and keep windows closed Avoid exposure to decaying vegetation. Avoid leaf raking. Avoid grain handling. Consider wearing a face mask if working in moldy areas.  Indoor Mold Control Maintain humidity below 50%. Clean washable surfaces with 5% bleach solution. Remove sources e.g. Contaminated carpets.   Control of Dust Mite Allergen Dust mites play a major role in allergic asthma  and rhinitis. They occur in environments with high humidity wherever human skin is found. Dust mites absorb humidity from the atmosphere (ie, they do not drink) and feed on organic matter (including shed human and animal skin). Dust mites are a microscopic type of insect that you cannot see with the naked eye. High levels of dust mites have been detected from mattresses, pillows,  carpets, upholstered furniture, bed covers, clothes, soft toys and any woven material. The principal allergen of the dust mite is found in its feces. A gram of dust may contain 1,000 mites and 250,000 fecal particles. Mite antigen is easily measured in the air during house cleaning activities. Dust mites do not bite and do not cause harm to humans, other than by triggering allergies/asthma.  Ways to decrease your exposure to dust mites in your home:  1. Encase mattresses, box springs and pillows with a mite-impermeable barrier or cover  2. Wash sheets, blankets and drapes weekly in hot water (130 F) with detergent and dry them in a dryer on the hot setting.  3. Have the room cleaned frequently with a vacuum cleaner and a damp dust-mop. For carpeting or rugs, vacuuming with a vacuum cleaner equipped with a high-efficiency particulate air (HEPA) filter. The dust mite allergic individual should not be in a room which is being cleaned and should wait 1 hour after cleaning before going into the room.  4. Do not sleep on upholstered furniture (eg, couches).  5. If possible removing carpeting, upholstered furniture and drapery from the home is ideal. Horizontal blinds should be eliminated in the rooms where the person spends the most time (bedroom, study, television room). Washable vinyl, roller-type shades are optimal.  6. Remove all non-washable stuffed toys from the bedroom. Wash stuffed toys weekly like sheets and blankets above.  7. Reduce indoor humidity to less than 50%. Inexpensive humidity monitors can be purchased at most hardware stores. Do not use a humidifier as can make the problem worse and are not recommended.

## 2020-10-17 NOTE — Progress Notes (Signed)
[address] [place] [address] Dept: [phone]  FOLLOW UP NOTE  Patient ID: Vicki Dennis, female    DOB: [birth year]  Age: 75 y.o. MRN: [id number] Date of Office Visit: 10/17/2020  Assessment  Chief Complaint: Asthma (Says the heat causes flare ups.) and Allergic Rhinitis  (Post nasal drip. Swollen face. Eyes are swollen shut in the morning. )  HPI Vicki Dennis is a 75 year old female who presents to the clinic for follow-up visit.  She was last seen in this clinic on 06/24/2020 by Nehemiah Settle, FNP, for evaluation of asthma, allergic rhinitis, and allergic conjunctivitis.  At today's visit, she reports that her asthma has been well controlled with no shortness of breath, cough, or wheeze.  She continues Symbicort 160-1 puff once a day and has not used albuterol since her last visit to this clinic.  Allergic rhinitis has been poorly controlled with symptoms including nasal congestion, clear rhinorrhea, sneezing, and thick postnasal drainage for which she continues Flonase and Benadryl as needed.  She denies discolored mucus or headache or fever.  She reports that, for the last 3 to 4 days she has experienced facial fullness and swelling around her eyes.  She reports clear eye drainage as well as thick white drainage accumulating in the corner of her eye in the morning.  She denies eye pain or change in vision.  She reports this swelling begins to resolve during the day but never fully resolves.  She has been using cool compresses with mild relief of symptoms.  Her current medications are listed in the chart.   Drug Allergies:  Allergies  Allergen Reactions   Codeine Other (See Comments)    "makes me crazy"   Hornet Venom Anaphylaxis   Sulfa Antibiotics Anaphylaxis    "died 3 times".   Doxycycline Other (See Comments)    "Yeast infection"   Oxycodone    Amoxicillin Rash    Physical Exam: BP 136/80   Pulse 72   Temp 98 F (36.7 C) (Temporal)   Resp 16   Ht 5\' 6"   (1.676 m)   Wt 193 lb 6.4 oz (87.7 kg)   SpO2 97%   BMI 31.22 kg/m    Physical Exam Vitals reviewed.  Constitutional:      Appearance: Normal appearance.  HENT:     Head: Normocephalic and atraumatic.     Right Ear: Tympanic membrane normal.     Left Ear: Tympanic membrane normal.     Nose:     Comments: Bilateral nares edematous and pale with thick clear nasal drainage noted.  Pharynx slightly erythematous with no exudate.  Ears normal.  Eyes normal.  Slight swelling noted around both eyes    Mouth/Throat:     Pharynx: Oropharynx is clear.  Eyes:     Conjunctiva/sclera: Conjunctivae normal.  Cardiovascular:     Rate and Rhythm: Normal rate and regular rhythm.     Heart sounds: Normal heart sounds. No murmur heard. Pulmonary:     Effort: Pulmonary effort is normal.     Breath sounds: Normal breath sounds.     Comments: Lungs clear to auscultation Musculoskeletal:        General: Normal range of motion.     Cervical back: Normal range of motion and neck supple.  Skin:    General: Skin is warm and dry.  Neurological:     Mental Status: She is alert and oriented to person, place, and time.  Psychiatric:  Mood and Affect: Mood normal.        Behavior: Behavior normal.        Thought Content: Thought content normal.        Judgment: Judgment normal.    Diagnostics: FVC 2.43, FEV1 1.76.  Predicted FVC 2.38, predicted FEV1 1.84.  Spirometry indicates normal ventilatory function.  Assessment and Plan: 1. Moderate persistent asthma without complication   2. Seasonal and perennial allergic rhinitis   3. Seasonal allergic conjunctivitis     Meds ordered this encounter  Medications   montelukast (SINGULAIR) 10 MG tablet    Sig: Take 1 tablet (10 mg total) by mouth at bedtime.    Dispense:  90 tablet    Refill:  5   fluticasone (FLONASE) 50 MCG/ACT nasal spray    Sig: Place 2 sprays into both nostrils daily.    Dispense:  16 g    Refill:  5   budesonide-formoterol  (SYMBICORT) 160-4.5 MCG/ACT inhaler    Sig: Inhale 2 puffs into the lungs in the morning and at bedtime.    Dispense:  10.2 g    Refill:  5    No additional refills until pt schedules office visit   albuterol (PROVENTIL HFA) 108 (90 Base) MCG/ACT inhaler    Sig: Inhale 2 puffs into the lungs every 4 (four) hours as needed for wheezing or shortness of breath.    Dispense:  18 g    Refill:  1   azelastine (ASTELIN) 0.1 % nasal spray    Sig: Place 2 sprays into both nostrils 2 (two) times daily.    Dispense:  30 mL    Refill:  5     Patient Instructions  Moderate persistent asthma Continue Symbicort 160/4.5 mcg 1-2 puffs 1-2 times a day to help prevent cough and wheeze May use albuterol 2 puffs every 4-6 hours as needed for cough,wheeze, tightness in chest, or shortness of breath. May also use albuterol 2 puffs 5-15 minutes prior to exercise Asthma control goals:  Full participation in all desired activities (may need albuterol before activity) Albuterol use two time or less a week on average (not counting use with activity) Cough interfering with sleep two time or less a month Oral steroids no more than once a year No hospitalizations  Seasonal and perennial allergic rhinitis (grasses, weeds, mold, and dust mite) Continue allergen avoidance measures as listed below Continue saline nasal rinses as needed for nasal symptoms. Use this before any medicated nasal sprays for best result Continue Flonase 2 sprays in each nostril once a day Begin azelastine nasal spray 2 sprays in each nostril twice a day to help with drainage Begin prednisone 10 mg tablets. Take 2 tablets twice a day for 3 days, then take 2 tablets once a day for 1 day, then take 1 tablet on the 5th day, then stop  After one week, begin cetirizine 10 mg once a day as needed for a runny nose Call the clinic if your symptoms worsen or do not improve  Allergic conjunctivitis Begin olopatadine eye drops one drop in each eye  once a day as needed for red or itchy eyes  Call the clinic if this treatment plan is not working well for you  Follow up in 1 month or sooner if needed.   Return in about 4 weeks (around 11/14/2020), or if symptoms worsen or fail to improve.    Thank you for the opportunity to care for this patient.  Please do not hesitate  to contact me with questions.  Thermon Leyland, FNP [facility]

## 2020-11-21 ENCOUNTER — Telehealth: Payer: Self-pay

## 2020-11-21 NOTE — Telephone Encounter (Signed)
Patient called to report she is being charged a no show fee on 08/04/20 with Ed. Saguier, but she was seen by [name] that same day. "She does not recall what happened exactly but she came in" Please review this claim

## 2020-11-27 NOTE — Telephone Encounter (Signed)
Emailed charge correction for removal of NS fee.

## 2020-12-10 ENCOUNTER — Other Ambulatory Visit: Payer: Self-pay | Admitting: Family Medicine

## 2020-12-30 ENCOUNTER — Ambulatory Visit: Payer: Medicare PPO | Admitting: Family Medicine

## 2020-12-30 ENCOUNTER — Other Ambulatory Visit (HOSPITAL_BASED_OUTPATIENT_CLINIC_OR_DEPARTMENT_OTHER): Payer: Self-pay

## 2020-12-30 ENCOUNTER — Other Ambulatory Visit: Payer: Self-pay

## 2020-12-30 ENCOUNTER — Encounter: Payer: Self-pay | Admitting: Family Medicine

## 2020-12-30 VITALS — BP 120/74 | HR 83 | Resp 16 | Wt 187.0 lb

## 2020-12-30 DIAGNOSIS — Z1239 Encounter for other screening for malignant neoplasm of breast: Secondary | ICD-10-CM | POA: Diagnosis not present

## 2020-12-30 DIAGNOSIS — L309 Dermatitis, unspecified: Secondary | ICD-10-CM

## 2020-12-30 DIAGNOSIS — E782 Mixed hyperlipidemia: Secondary | ICD-10-CM | POA: Diagnosis not present

## 2020-12-30 DIAGNOSIS — Z0000 Encounter for general adult medical examination without abnormal findings: Secondary | ICD-10-CM | POA: Diagnosis not present

## 2020-12-30 DIAGNOSIS — H1010 Acute atopic conjunctivitis, unspecified eye: Secondary | ICD-10-CM

## 2020-12-30 DIAGNOSIS — J4520 Mild intermittent asthma, uncomplicated: Secondary | ICD-10-CM | POA: Diagnosis not present

## 2020-12-30 DIAGNOSIS — Z780 Asymptomatic menopausal state: Secondary | ICD-10-CM

## 2020-12-30 DIAGNOSIS — E669 Obesity, unspecified: Secondary | ICD-10-CM | POA: Diagnosis not present

## 2020-12-30 DIAGNOSIS — E1169 Type 2 diabetes mellitus with other specified complication: Secondary | ICD-10-CM | POA: Diagnosis not present

## 2020-12-30 DIAGNOSIS — E2839 Other primary ovarian failure: Secondary | ICD-10-CM

## 2020-12-30 DIAGNOSIS — K429 Umbilical hernia without obstruction or gangrene: Secondary | ICD-10-CM | POA: Diagnosis not present

## 2020-12-30 MED ORDER — TRIAMCINOLONE ACETONIDE 0.1 % EX OINT: 1.0000 "application " | TOPICAL_OINTMENT | Freq: Two times a day (BID) | CUTANEOUS | 2 refills | Status: DC

## 2020-12-30 NOTE — Patient Instructions (Addendum)
Paxlovid is the new COVID medication we can give you if you get COVID so make sure you test if you have symptoms because we have to treat by day 5 of symptoms for it to be effective. If you are positive let us know so we can treat. If a home test is negative and your symptoms are persistent get a PCR test. Can check testing locations at [email or web address] If you are positive we will make an appointment with [place] and we will send in Paxlovid if you would like it. Check with your pharmacy before we meet to confirm they have it in stock, if they do not then we can get the prescription at the [place] 65 Years and Older, Female Preventive care refers to lifestyle choices and visits with your health care provider that can promote health and wellness. This includes: A yearly physical exam. This is also called an annual wellness visit. Regular dental and eye exams. Immunizations. Screening for certain conditions. Healthy lifestyle choices, such as: Eating a healthy diet. Getting regular exercise. Not using drugs or products that contain nicotine and tobacco. Limiting alcohol use. What can I expect for my preventive care visit? Physical exam Your health care provider will check your: Height and weight. These may be used to calculate your BMI (body mass index). BMI is a measurement that tells if you are at a healthy weight. Heart rate and blood pressure. Body temperature. Skin for abnormal spots. Counseling Your health care provider may ask you questions about your: Past medical problems. Family's medical history. Alcohol, tobacco, and drug use. Emotional well-being. Home life and relationship well-being. Sexual activity. Diet, exercise, and sleep habits. History of falls. Memory and ability to understand (cognition). Work and work Statistician. Pregnancy and menstrual history. Access to firearms. What immunizations do I need? Vaccines  are usually given at various ages, according to a schedule. Your health care provider will recommend vaccines for you based on your age, medical history, and lifestyle or other factors, such as travel or where you work. What tests do I need? Blood tests Lipid and cholesterol levels. These may be checked every 5 years, or more often depending on your overall health. Hepatitis C test. Hepatitis B test. Screening Lung cancer screening. You may have this screening every year starting at age 7 if you have a 30-pack-year history of smoking and currently smoke or have quit within the past 15 years. Colorectal cancer screening. All adults should have this screening starting at age 2 and continuing until age 30. Your health care provider may recommend screening at age 56 if you are at increased risk. You will have tests every 1-10 years, depending on your results and the type of screening test. Diabetes screening. This is done by checking your blood sugar (glucose) after you have not eaten for a while (fasting). You may have this done every 1-3 years. Mammogram. This may be done every 1-2 years. Talk with your health care provider about how often you should have regular mammograms. Abdominal aortic aneurysm (AAA) screening. You may need this if you are a current or former smoker. BRCA-related cancer screening. This may be done if you have a family history of breast, ovarian, tubal, or peritoneal cancers. Other tests STD (sexually transmitted disease) testing, if you are at risk. Bone density scan. This is done to screen for osteoporosis. You may have this done starting at age 17. Talk with your health care provider  about your test results, treatment options, and if necessary, the need for more tests. Follow these instructions at home: Eating and drinking  Eat a diet that includes fresh fruits and vegetables, whole grains, lean protein, and low-fat dairy products. Limit your intake of foods with  high amounts of sugar, saturated fats, and salt. Take vitamin and mineral supplements as recommended by your health care provider. Do not drink alcohol if your health care provider tells you not to drink. If you drink alcohol: Limit how much you have to 0-1 drink a day. Be aware of how much alcohol is in your drink. In the [place] one drink equals one 12 oz bottle of beer (355 mL), one 5 oz glass of wine (148 mL), or one 1 oz glass of hard liquor (44 mL). Lifestyle Take daily care of your teeth and gums. Brush your teeth every morning and night with fluoride toothpaste. Floss one time each day. Stay active. Exercise for at least 30 minutes 5 or more days each week. Do not use any products that contain nicotine or tobacco, such as cigarettes, e-cigarettes, and chewing tobacco. If you need help quitting, ask your health care provider. Do not use drugs. If you are sexually active, practice safe sex. Use a condom or other form of protection in order to prevent STIs (sexually transmitted infections). Talk with your health care provider about taking a low-dose aspirin or statin. Find healthy ways to cope with stress, such as: Meditation, yoga, or listening to music. Journaling. Talking to a trusted person. Spending time with friends and family. Safety Always wear your seat belt while driving or riding in a vehicle. Do not drive: If you have been drinking alcohol. Do not ride with someone who has been drinking. When you are tired or distracted. While texting. Wear a helmet and other protective equipment during sports activities. If you have firearms in your house, make sure you follow all gun safety procedures. What's next? Visit your health care provider once a year for an annual wellness visit. Ask your health care provider how often you should have your eyes and teeth checked. Stay up to date on all vaccines. This information is not intended to replace advice given to you by your health care  provider. Make sure you discuss any questions you have with your health care provider. Document Revised: 06/07/2020 Document Reviewed: 03/24/2018 Elsevier Patient Education  2022 [employer].

## 2020-12-30 NOTE — Assessment & Plan Note (Addendum)
Encouraged DASH or MIND diet, decrease po intake and increase exercise as tolerated. Needs 7-8 hours of sleep nightly. Avoid trans fats, eat small, frequent meals every 4-5 hours with lean proteins, complex carbs and healthy fats. Minimize simple carbs, high fat foods and processed foods, several pound weight loss since last visit

## 2020-12-30 NOTE — Assessment & Plan Note (Signed)
Uses Triamcinolone cream infrequently is allowed a refill

## 2020-12-30 NOTE — Assessment & Plan Note (Addendum)
Persistent asymptomatic, patient unclear if enlarging but wants it looked at

## 2020-12-30 NOTE — Assessment & Plan Note (Signed)
Was really flared but she as seen her allergist and is better now.

## 2020-12-30 NOTE — Assessment & Plan Note (Signed)
Patient encouraged to maintain heart healthy diet, regular exercise, adequate sleep. Consider daily probiotics. Take medications as prescribed. Labs ordered and reviewed 

## 2020-12-30 NOTE — Assessment & Plan Note (Signed)
Encourage heart healthy diet such as MIND or DASH diet, increase exercise, avoid trans fats, simple carbohydrates and processed foods, consider a krill or fish or flaxseed oil cap daily.  °
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
## 2020-12-30 NOTE — Assessment & Plan Note (Signed)
hgba1c acceptable, minimize simple carbs. Increase exercise as tolerated. Continue current meds 

## 2021-01-06 ENCOUNTER — Other Ambulatory Visit (INDEPENDENT_AMBULATORY_CARE_PROVIDER_SITE_OTHER): Payer: Medicare PPO

## 2021-01-06 ENCOUNTER — Other Ambulatory Visit: Payer: Self-pay

## 2021-01-06 ENCOUNTER — Telehealth: Payer: Self-pay | Admitting: Family Medicine

## 2021-01-06 DIAGNOSIS — J4520 Mild intermittent asthma, uncomplicated: Secondary | ICD-10-CM

## 2021-01-06 DIAGNOSIS — E669 Obesity, unspecified: Secondary | ICD-10-CM

## 2021-01-06 DIAGNOSIS — E1169 Type 2 diabetes mellitus with other specified complication: Secondary | ICD-10-CM | POA: Diagnosis not present

## 2021-01-06 DIAGNOSIS — E782 Mixed hyperlipidemia: Secondary | ICD-10-CM

## 2021-01-06 LAB — COMPREHENSIVE METABOLIC PANEL
ALT: 12 U/L (ref 0–35)
AST: 14 U/L (ref 0–37)
Albumin: 4 g/dL (ref 3.5–5.2)
Alkaline Phosphatase: 53 U/L (ref 39–117)
BUN: 17 mg/dL (ref 6–23)
CO2: 27 mEq/L (ref 19–32)
Calcium: 9.3 mg/dL (ref 8.4–10.5)
Chloride: 105 mEq/L (ref 96–112)
Creatinine, Ser: 0.73 mg/dL (ref 0.40–1.20)
GFR: 79.22 mL/min (ref >60.00)
Glucose, Bld: 105 mg/dL — ABNORMAL HIGH (ref 70–99)
Potassium: 4.4 mEq/L (ref 3.5–5.1)
Sodium: 140 mEq/L (ref 135–145)
Total Bilirubin: 0.6 mg/dL (ref 0.2–1.2)
Total Protein: 6.1 g/dL (ref 6.0–8.3)

## 2021-01-06 LAB — TSH: TSH: 1.62 u[IU]/mL (ref 0.35–5.50)

## 2021-01-06 LAB — LIPID PANEL
Cholesterol: 224 mg/dL — ABNORMAL HIGH (ref 0–200)
HDL: 67.8 mg/dL (ref >39.00)
LDL Cholesterol: 135 mg/dL — ABNORMAL HIGH (ref 0–99)
NonHDL: 156.51
Total CHOL/HDL Ratio: 3
Triglycerides: 106 mg/dL (ref 0.0–149.0)
VLDL: 21.2 mg/dL (ref 0.0–40.0)

## 2021-01-06 LAB — CBC
HCT: 41.1 % (ref 36.0–46.0)
Hemoglobin: 13.5 g/dL (ref 12.0–15.0)
MCHC: 32.9 g/dL (ref 30.0–36.0)
MCV: 89.1 fl (ref 78.0–100.0)
Platelets: 180 10*3/uL (ref 150.0–400.0)
RBC: 4.61 Mil/uL (ref 3.87–5.11)
RDW: 14.1 % (ref 11.5–15.5)
WBC: 5.5 10*3/uL (ref 4.0–10.5)

## 2021-01-06 LAB — HEMOGLOBIN A1C: Hgb A1c MFr Bld: 7.1 % — ABNORMAL HIGH (ref 4.6–6.5)

## 2021-01-06 NOTE — Telephone Encounter (Signed)
Pt dropped off copy of her Flu shot vaccine done. (1 page [facility] document) Document put at front office tray under providers name.

## 2021-01-06 NOTE — Telephone Encounter (Signed)
Flu shot abstracted into chart.

## 2021-01-20 ENCOUNTER — Telehealth: Payer: Self-pay | Admitting: Family Medicine

## 2021-01-20 NOTE — Telephone Encounter (Signed)
Results printed and sent

## 2021-01-20 NOTE — Telephone Encounter (Signed)
Patient would like her lab results be mailed to her for her own records. Please advice.

## 2021-01-24 ENCOUNTER — Encounter: Payer: Medicare PPO | Admitting: Family

## 2021-02-24 ENCOUNTER — Encounter: Payer: Self-pay | Admitting: Allergy

## 2021-02-24 ENCOUNTER — Other Ambulatory Visit: Payer: Self-pay

## 2021-02-24 ENCOUNTER — Ambulatory Visit: Payer: Medicare PPO | Admitting: Allergy

## 2021-02-24 VITALS — BP 140/90 | HR 68 | Temp 98.0°F | Resp 18 | Ht 66.0 in | Wt 185.8 lb

## 2021-02-24 DIAGNOSIS — H1013 Acute atopic conjunctivitis, bilateral: Secondary | ICD-10-CM

## 2021-02-24 DIAGNOSIS — J4540 Moderate persistent asthma, uncomplicated: Secondary | ICD-10-CM

## 2021-02-24 DIAGNOSIS — J302 Other seasonal allergic rhinitis: Secondary | ICD-10-CM

## 2021-02-24 DIAGNOSIS — J3089 Other allergic rhinitis: Secondary | ICD-10-CM

## 2021-02-24 DIAGNOSIS — T63421A Toxic effect of venom of ants, accidental (unintentional), initial encounter: Secondary | ICD-10-CM | POA: Diagnosis not present

## 2021-02-24 DIAGNOSIS — H1010 Acute atopic conjunctivitis, unspecified eye: Secondary | ICD-10-CM

## 2021-02-24 MED ORDER — TRIAMCINOLONE ACETONIDE 0.1 % EX OINT: 1.0000 "application " | TOPICAL_OINTMENT | Freq: Two times a day (BID) | CUTANEOUS | 0 refills | Status: DC | PRN

## 2021-02-24 MED ORDER — METHYLPREDNISOLONE ACETATE 80 MG/ML IJ SUSP
80.0000 mg | Freq: Once | INTRAMUSCULAR | Status: AC
  Administered 2021-02-24: 80 mg via INTRAMUSCULAR

## 2021-02-24 MED ORDER — CEPHALEXIN 500 MG PO CAPS: 500.0000 mg | ORAL_CAPSULE | Freq: Four times a day (QID) | ORAL | 0 refills | Status: DC

## 2021-02-24 NOTE — Assessment & Plan Note (Signed)
Multiple fire ant bites on feet bilaterally which is extremely pruritic and swollen now. Denies any systemic reactions. Benadryl and topical cortisone not effective.  . Steroid injection (depo 80mg  IM) given today. . Start prednisone taper o Cautioned patient about monitoring sugar and blood pressure while taking steroids.  . Take keflex 500mg  every 6 hours for 7 days. . Use triamcinolone 0.1% ointment twice a day as needed for rash flares. Do not use on the face, neck, armpits or groin area. Do not use more than 3 weeks in a row.  . Use over the counter antihistamines such as Zyrtec (cetirizine), Claritin (loratadine), Allegra (fexofenadine), or Xyzal (levocetirizine) daily as needed. May take twice a day as needed for itching.

## 2021-02-24 NOTE — Progress Notes (Signed)
Follow Up Note  RE: Vicki Dennis MRN: [id number] DOB: 01/24/1946 Date of Office Visit: 02/24/2021  Referring provider: Bradd Canary, MD Primary care provider: Bradd Canary, MD  Chief Complaint: Insect Bite (Fire ants bites - Saturday - feet/ankles; behind left knee )  History of Present Illness: I had the pleasure of seeing Vicki Dennis for a follow up visit at the [facility] on 02/24/2021. She is a 75 y.o. female, who is being followed for asthma, allergic rhino conjunctivitis. Her previous allergy office visit was on 10/17/2020 with Thermon Leyland, FNP. Today is a new complaint visit of ant bite .  Patient was outdoors this past weekend and was powerwashing. She got bitten by multiple fire ants and now having swelling on her feet. This is painful and itchy. She couldn't go to work today.   Went to [employer] and she took some benadryl and hydrocortisone cream with no benefit. Benadryl causes elevated blood pressure.  Patient was covering her bites with witch hazel soaked paper towels.  No systemic reactions and asthma did not flare.   Not sure when she had bitten by fire ants in the past.   Assessment and Plan: Vicki Dennis is a 75 y.o. female with: Fire ant bite Multiple fire ant bites on feet bilaterally which is extremely pruritic and swollen now. Denies any systemic reactions. Benadryl and topical cortisone not effective.  Steroid injection (depo 80mg  IM) given today. Start prednisone taper Cautioned patient about monitoring sugar and blood pressure while taking steroids.  Take keflex 500mg  every 6 hours for 7 days. Use triamcinolone 0.1% ointment twice a day as needed for rash flares. Do not use on the face, neck, armpits or groin area. Do not use more than 3 Dennis in a row.  Use over the counter antihistamines such as Zyrtec (cetirizine), Claritin (loratadine), Allegra (fexofenadine), or Xyzal (levocetirizine) daily as needed. May take twice a day as needed  for itching.  Return in about 3 months (around 05/27/2021).  Meds ordered this encounter  Medications   cephALEXin (KEFLEX) 500 MG capsule    Sig: Take 1 capsule (500 mg total) by mouth 4 (four) times daily. For 7 days    Dispense:  28 capsule    Refill:  0   triamcinolone ointment (KENALOG) 0.1 %    Sig: Apply 1 application topically 2 (two) times daily as needed (rash flare). Do not use on the face, neck, armpits or groin area. Do not use more than 3 Dennis in a row.    Dispense:  80 g    Refill:  0   methylPREDNISolone acetate (DEPO-MEDROL) injection 80 mg    Lab Orders  No laboratory test(s) ordered today    Diagnostics None.   Medication List:  Current Outpatient Medications  Medication Sig Dispense Refill   ACCU-CHEK AVIVA PLUS test strip USE TO CHECK BLOOD SUGAR ONCE DAILY AS DIRECTED 100 each 1   albuterol (PROVENTIL HFA) 108 (90 Base) MCG/ACT inhaler Inhale 2 puffs into the lungs every 4 (four) hours as needed for wheezing or shortness of breath. 18 g 1   azelastine (ASTELIN) 0.1 % nasal spray Place 2 sprays into both nostrils 2 (two) times daily. 30 mL 5   blood glucose meter kit and supplies KIT Dispense based on patient and insurance preference. Use up to four times daily as directed. (FOR ICD-9 250.00, 250.01). 1 each 0   budesonide-formoterol (SYMBICORT) 160-4.5 MCG/ACT inhaler Inhale 2 puffs into the  lungs in the morning and at bedtime. 10.2 g 5   cephALEXin (KEFLEX) 500 MG capsule Take 1 capsule (500 mg total) by mouth 4 (four) times daily. For 7 days 28 capsule 0   enalapril (VASOTEC) 2.5 MG tablet TAKE ONE TABLET BY MOUTH ONE TIME DAILY 90 tablet 0   EPINEPHrine 0.3 MG/0.3ML SOSY Use as directed for severe allergic reactions 2 each 1   fluticasone (FLONASE) 50 MCG/ACT nasal spray Place 2 sprays into both nostrils daily. 16 g 5   Lancets 30G MISC Use as directed once daily to check blood sugar.  Diagnosis code E11.9 100 each 5   meloxicam (MOBIC) 15 MG tablet Take  1 tablet (15 mg total) by mouth daily. 30 tablet 0   montelukast (SINGULAIR) 10 MG tablet Take 1 tablet (10 mg total) by mouth at bedtime. 90 tablet 5   Multiple Vitamins-Minerals (MULTIVITAMIN WITH MINERALS) tablet Take 1 tablet by mouth daily.     nystatin cream (MYCOSTATIN) Apply 1 application topically 2 (two) times daily. 30 g 2   Omega-3 Fatty Acids (FISH OIL CONCENTRATE) 1000 MG CAPS Take by mouth 3 (three) times daily.     triamcinolone ointment (KENALOG) 0.1 % Apply 1 application topically 2 (two) times daily as needed (rash flare). Do not use on the face, neck, armpits or groin area. Do not use more than 3 Dennis in a row. 80 g 0   No current facility-administered medications for this visit.   Allergies: Allergies  Allergen Reactions   Codeine Other (See Comments)    "makes me crazy"   Hornet Venom Anaphylaxis   Sulfa Antibiotics Anaphylaxis    "died 3 times".   Doxycycline Other (See Comments)    "Yeast infection"   Oxycodone    Amoxicillin Rash   I reviewed her past medical history, social history, family history, and environmental history and no significant changes have been reported from her previous visit.  Review of Systems  Constitutional:  Negative for appetite change, chills, fever and unexpected weight change.  HENT:  Negative for congestion and rhinorrhea.   Eyes:  Negative for itching.  Respiratory:  Negative for cough, chest tightness, shortness of breath and wheezing.   Gastrointestinal:  Negative for abdominal pain.  Skin:  Positive for rash.  Allergic/Immunologic: Positive for environmental allergies.  Neurological:  Negative for headaches.   Objective: BP 140/90   Pulse 68   Temp 98 F (36.7 C)   Resp 18   Ht 5\' 6"  (1.676 m)   Wt 185 lb 12.8 oz (84.3 kg)   SpO2 98%   BMI 29.99 kg/m  Body mass index is 29.99 kg/m. Physical Exam Vitals and nursing note reviewed.  Constitutional:      Appearance: Normal appearance. She is well-developed.   HENT:     Head: Normocephalic and atraumatic.     Right Ear: Tympanic membrane and external ear normal.     Left Ear: Tympanic membrane and external ear normal.     Nose: Nose normal.     Mouth/Throat:     Mouth: Mucous membranes are moist.     Pharynx: Oropharynx is clear.  Eyes:     Conjunctiva/sclera: Conjunctivae normal.  Cardiovascular:     Rate and Rhythm: Normal rate and regular rhythm.     Heart sounds: Normal heart sounds. No murmur heard. Pulmonary:     Effort: Pulmonary effort is normal.     Breath sounds: Normal breath sounds. No wheezing, rhonchi or rales.  Musculoskeletal:  Cervical back: Neck supple.  Skin:    General: Skin is warm.     Findings: Rash present.     Comments: Swollen feet b/l with multiple circular erythematous bites. Not warm to touch.  Neurological:     Mental Status: She is alert and oriented to person, place, and time.  Psychiatric:        Behavior: Behavior normal.  Previous notes and tests were reviewed. The plan was reviewed with the patient/family, and all questions/concerned were addressed.  It was my pleasure to see Vicki Dennis today and participate in her care. Please feel free to contact me with any questions or concerns.  Sincerely,  Wyline Mood, DO Allergy & Immunology  [facility] office: [phone] [facility] office: [phone]

## 2021-02-24 NOTE — Patient Instructions (Addendum)
Ant bites Steroid injection given today.  Start prednisone taper: Prednisone 10mg  tablet pack: 2 tablets given in office today. Take 2 more tablets before bed today.  Then take 2 tablets twice a day for 2 more days. Then take 2 tablets once a day for 1 day. Then take 1 tablet once a day for 1 day.   Take keflex 500mg  every 6 hours for 7 days. Use triamcinolone 0.1% ointment twice a day as needed for rash flares. Do not use on the face, neck, armpits or groin area. Do not use more than 3 weeks in a row.  Use over the counter antihistamines such as Zyrtec (cetirizine), Claritin (loratadine), Allegra (fexofenadine), or Xyzal (levocetirizine) daily as needed. May take twice a day as needed for itching.  If not better let us know.

## 2021-03-09 ENCOUNTER — Other Ambulatory Visit: Payer: Self-pay | Admitting: Family Medicine

## 2021-04-17 DIAGNOSIS — H2513 Age-related nuclear cataract, bilateral: Secondary | ICD-10-CM | POA: Diagnosis not present

## 2021-04-21 ENCOUNTER — Ambulatory Visit (HOSPITAL_BASED_OUTPATIENT_CLINIC_OR_DEPARTMENT_OTHER)
Admission: RE | Admit: 2021-04-21 | Discharge: 2021-04-21 | Disposition: A | Discharge status: Home / Self Care | Payer: Medicare PPO | Source: Ambulatory Visit | Attending: Family Medicine | Admitting: Family Medicine

## 2021-04-21 ENCOUNTER — Encounter (HOSPITAL_BASED_OUTPATIENT_CLINIC_OR_DEPARTMENT_OTHER): Payer: Self-pay

## 2021-04-21 ENCOUNTER — Other Ambulatory Visit: Payer: Self-pay

## 2021-04-21 DIAGNOSIS — Z780 Asymptomatic menopausal state: Secondary | ICD-10-CM | POA: Insufficient documentation

## 2021-04-21 DIAGNOSIS — Z1239 Encounter for other screening for malignant neoplasm of breast: Secondary | ICD-10-CM | POA: Diagnosis present

## 2021-04-21 DIAGNOSIS — Z1231 Encounter for screening mammogram for malignant neoplasm of breast: Secondary | ICD-10-CM | POA: Diagnosis not present

## 2021-04-21 DIAGNOSIS — Z13820 Encounter for screening for osteoporosis: Secondary | ICD-10-CM | POA: Diagnosis not present

## 2021-04-21 DIAGNOSIS — E2839 Other primary ovarian failure: Secondary | ICD-10-CM | POA: Insufficient documentation

## 2021-05-09 ENCOUNTER — Other Ambulatory Visit: Payer: Self-pay | Admitting: Family Medicine

## 2021-06-30 ENCOUNTER — Ambulatory Visit: Payer: Medicare PPO | Admitting: Family Medicine

## 2021-07-01 ENCOUNTER — Encounter: Payer: Self-pay | Admitting: Family

## 2021-07-01 ENCOUNTER — Telehealth: Payer: Self-pay | Admitting: Family

## 2021-07-01 ENCOUNTER — Other Ambulatory Visit (HOSPITAL_BASED_OUTPATIENT_CLINIC_OR_DEPARTMENT_OTHER): Payer: Self-pay

## 2021-07-01 ENCOUNTER — Ambulatory Visit: Payer: Medicare PPO | Admitting: Family

## 2021-07-01 VITALS — BP 140/72 | HR 66 | Temp 98.1°F | Resp 16 | Wt 186.0 lb

## 2021-07-01 DIAGNOSIS — E1169 Type 2 diabetes mellitus with other specified complication: Secondary | ICD-10-CM | POA: Diagnosis not present

## 2021-07-01 DIAGNOSIS — E782 Mixed hyperlipidemia: Secondary | ICD-10-CM | POA: Diagnosis not present

## 2021-07-01 DIAGNOSIS — Z7185 Encounter for immunization safety counseling: Secondary | ICD-10-CM

## 2021-07-01 DIAGNOSIS — E669 Obesity, unspecified: Secondary | ICD-10-CM

## 2021-07-01 LAB — COMPREHENSIVE METABOLIC PANEL
ALT: 11 U/L (ref 0–35)
AST: 14 U/L (ref 0–37)
Albumin: 4.1 g/dL (ref 3.5–5.2)
Alkaline Phosphatase: 68 U/L (ref 39–117)
BUN: 19 mg/dL (ref 6–23)
CO2: 29 mEq/L (ref 19–32)
Calcium: 9.5 mg/dL (ref 8.4–10.5)
Chloride: 106 mEq/L (ref 96–112)
Creatinine, Ser: 0.65 mg/dL (ref 0.40–1.20)
GFR: 84.53 mL/min (ref >60.00)
Glucose, Bld: 105 mg/dL — ABNORMAL HIGH (ref 70–99)
Potassium: 4.3 mEq/L (ref 3.5–5.1)
Sodium: 140 mEq/L (ref 135–145)
Total Bilirubin: 0.5 mg/dL (ref 0.2–1.2)
Total Protein: 6.1 g/dL (ref 6.0–8.3)

## 2021-07-01 LAB — HEMOGLOBIN A1C: Hgb A1c MFr Bld: 6.8 % — ABNORMAL HIGH (ref 4.6–6.5)

## 2021-07-01 MED ORDER — ROSUVASTATIN CALCIUM 5 MG PO TABS
5.0000 mg | ORAL_TABLET | Freq: Every day | ORAL | 0 refills | Status: DC
  Filled 2021-07-01: qty 90, 90d supply, fill #0

## 2021-07-01 MED ORDER — VAXCHORA PO SUSR
ORAL | 0 refills | Status: DC
  Filled ????-??-??: fill #0

## 2021-07-01 MED ORDER — ATOVAQUONE-PROGUANIL HCL 250-100 MG PO TABS
ORAL_TABLET | ORAL | 0 refills | Status: DC
  Filled 2021-09-22: qty 50, 50d supply, fill #0

## 2021-07-01 NOTE — Assessment & Plan Note (Addendum)
Lab Results  ?Component Value Date  ? HGBA1C 7.1 (H) 01/06/2021  ? HGBA1C 6.8 (H) 06/10/2020  ? HGBA1C 6.7 (H) 01/24/2020  ? ?Lab Results  ?Component Value Date  ? MICROALBUR 2.1 (H) 08/13/2017  ? [place] 135 (H) 01/06/2021  ? CREATININE 0.73 01/06/2021  ? ?Continue ACE for renal protection. Reinforced diabetic diet.   ? ?Wt Readings from Last 3 Encounters:  ?07/01/21 186 lb (84.4 kg)  ?02/24/21 185 lb 12.8 oz (84.3 kg)  ?12/30/20 187 lb (84.8 kg)  ? ? ?

## 2021-07-01 NOTE — Telephone Encounter (Signed)
See mychart.  

## 2021-07-01 NOTE — Assessment & Plan Note (Addendum)
Lab Results  ?Component Value Date  ? CHOL 224 (H) 01/06/2021  ? HDL 67.80 01/06/2021  ? [place] 135 (H) 01/06/2021  ? LDLDIRECT 111.2 02/06/2014  ? TRIG 106.0 01/06/2021  ? CHOLHDL 3 01/06/2021  ? ?Agreeable to try statin for CV risk reduction. Calculated CV risk is 12.5%.  Rx sent for crestor 5 mg.  ?

## 2021-07-01 NOTE — Progress Notes (Signed)
? ?Subjective:  ? ?By signing my name below, I, Vicki Dennis, attest that this documentation has been prepared under the direction and in the presence of Vicki Craze NP, 07/01/2021 ? ? Patient ID: Vicki Dennis, female    DOB: 10-04-1945, 76 y.o.   MRN: [id number] ? ?Chief Complaint  ?Patient presents with  ? Diabetes  ?  Here for follow up  ? Asthma  ?  Here for follow up  ? ? ?HPI ?Patient is in today for an office visit. ? ?Asthma - She is taking 160 - 4.5 MCG/ACT of Symbicort. Her asthma symptoms have worsen because her neighbor has been using [place] on their lawn. ? ?Blood sugar - As of today's visit, her A1C levels have been elevating. She has been trying to improve her diet by eliminating the amount of sweets she consumes. Also, she has been taking a vitamin that is known to decrease blood sugar. She has also been exercising more frequently.  ?Lab Results  ?Component Value Date  ? HGBA1C 7.1 (H) 01/06/2021  ? ?Statins - Her LDL levels are elevating. She is interested in taking a very low dose of statin.  ?Lab Results  ?Component Value Date  ? CHOL 224 (H) 01/06/2021  ? HDL 67.80 01/06/2021  ? LDLCALC 135 (H) 01/06/2021  ? LDLDIRECT 111.2 02/06/2014  ? TRIG 106.0 01/06/2021  ? CHOLHDL 3 01/06/2021  ? ?Blood Pressure - As of today's visit, her blood pressure is 140/72. ?BP Readings from Last 3 Encounters:  ?07/01/21 140/72  ?02/24/21 140/90  ?12/30/20 120/74  ? ?Weight - Her weight has slightly increased. ?Wt Readings from Last 3 Encounters:  ?07/01/21 186 lb (84.4 kg)  ?02/24/21 185 lb 12.8 oz (84.3 kg)  ?12/30/20 187 lb (84.8 kg)  ? ?She will be travelling to [place] for 1 month this summer and wants to know if she needs any special vaccines.  ? ? ?Health Maintenance Due  ?Topic Date Due  ? OPHTHALMOLOGY EXAM  Never done  ? FOOT EXAM  06/15/2017  ? COVID-19 Vaccine (5 - Booster for Moderna series) 11/06/2020  ? ? ?Past Medical History:  ?Diagnosis Date  ? Allergy   ? Anemia   ? h/o  ? Asthma   ?  Asthma, mild intermittent, well-controlled 02/10/2014  ? Blood transfusion without reported diagnosis   ? Chicken pox as a child  ? Diabetes mellitus   ? type-II, per Dr. Parke Simmers, no meds. , pt. reports weight loss has contributed  to control of diabetes.  She reports last HgbA1c- wnl.  ? Diabetes mellitus 11/05/2010  ? Diabetes mellitus type 2 in obese (HCC) 11/05/2010  ? Diabetes mellitus type 2, uncontrolled 11/05/2010  ? Esophageal reflux 02/10/2014  ? GERD (gastroesophageal reflux disease)   ? rare use of OTC aid  ? Hyperglycemia 09/02/2015  ? Hyperlipidemia   ? Hypertension   ? Measles as a child  ? Measles   ? Medicare annual wellness visit, subsequent 08/06/2014  ? Mumps as a child  ? Obesity 02/10/2014  ? Pneumonia   ? as a child  ? Portal vein thrombosis 03/09/2011  ? coumadin ended summer of 2013  ? Preventative health care 08/12/2014  ? Rash and nonspecific skin eruption 01/07/2015  ? Seborrheic keratosis 02/10/2014  ? Follows with dermatology  ? Sinusitis, acute 03/23/2016  ? ? ?Past Surgical History:  ?Procedure Laterality Date  ? BREAST BIOPSY Bilateral   ? INSERTION OF MESH  03/16/2012  ? Procedure: INSERTION OF MESH;  Surgeon: Shelly Rubenstein, MD;  Location: [facility] OR;  Service: General;  Laterality: N/A;  ? KNEE ARTHROSCOPY W/ MENISCAL REPAIR    ? both knees  ? TONSILLECTOMY    ? UMBILICAL HERNIA REPAIR  03/16/2012  ? Procedure: HERNIA REPAIR UMBILICAL ADULT;  Surgeon: Shelly Rubenstein, MD;  Location: [facility] OR;  Service: General;  Laterality: N/A;  ? ? ?Family History  ?Problem Relation Age of Onset  ? Cancer Father   ?     prostate  ? Aneurysm Mother 12  ?     brain  ? Cancer Maternal Aunt   ? ? ?Social History  ? ?Socioeconomic History  ? Marital status: Divorced  ?  Spouse name: Not on file  ? Number of children: Not on file  ? Years of education: Not on file  ? Highest education level: Not on file  ?Occupational History  ? Not on file  ?Tobacco Use  ? Smoking status: Never  ? Smokeless tobacco: Never  ?  Tobacco comments:  ?  never used tobacco  ?Vaping Use  ? Vaping Use: Never used  ?Substance and Sexual Activity  ? Alcohol use: Yes  ?  Alcohol/week: 0.0 standard drinks  ?  Comment: for holidays  ? Drug use: No  ? Sexual activity: Not Currently  ?  Comment: lives alone   ?Other Topics Concern  ? Not on file  ?Social History Narrative  ? Retired Runner, broadcasting/film/video  ? ?Social Determinants of Health  ? ?Financial Resource Strain: Not on file  ?Food Insecurity: Not on file  ?Transportation Needs: Not on file  ?Physical Activity: Not on file  ?Stress: Not on file  ?Social Connections: Not on file  ?Intimate Partner Violence: Not on file  ? ? ?Outpatient Medications Prior to Visit  ?Medication Sig Dispense Refill  ? ACCU-CHEK AVIVA PLUS test strip Use to check blood sugar once daily as directed 100 each 3  ? albuterol (PROVENTIL HFA) 108 (90 Base) MCG/ACT inhaler Inhale 2 puffs into the lungs every 4 (four) hours as needed for wheezing or shortness of breath. 18 g 1  ? azelastine (ASTELIN) 0.1 % nasal spray Place 2 sprays into both nostrils 2 (two) times daily. 30 mL 5  ? blood glucose meter kit and supplies KIT Dispense based on patient and insurance preference. Use up to four times daily as directed. (FOR ICD-9 250.00, 250.01). 1 each 0  ? budesonide-formoterol (SYMBICORT) 160-4.5 MCG/ACT inhaler Inhale 2 puffs into the lungs in the morning and at bedtime. 10.2 g 5  ? enalapril (VASOTEC) 2.5 MG tablet TAKE ONE TABLET BY MOUTH ONE TIME DAILY 90 tablet 1  ? EPINEPHrine 0.3 MG/0.3ML SOSY Use as directed for severe allergic reactions 2 each 1  ? fluticasone (FLONASE) 50 MCG/ACT nasal spray Place 2 sprays into both nostrils daily. 16 g 5  ? Lancets 30G MISC Use as directed once daily to check blood sugar.  Diagnosis code E11.9 100 each 5  ? montelukast (SINGULAIR) 10 MG tablet Take 1 tablet (10 mg total) by mouth at bedtime. 90 tablet 5  ? Multiple Vitamins-Minerals (MULTIVITAMIN WITH MINERALS) tablet Take 1 tablet by mouth daily.     ? Omega-3 Fatty Acids (FISH OIL CONCENTRATE) 1000 MG CAPS Take by mouth 3 (three) times daily.    ? cephALEXin (KEFLEX) 500 MG capsule Take 1 capsule (500 mg total) by mouth 4 (four) times daily. For 7 days 28 capsule 0  ? meloxicam (MOBIC) 15 MG tablet Take 1  tablet (15 mg total) by mouth daily. 30 tablet 0  ? nystatin cream (MYCOSTATIN) Apply 1 application topically 2 (two) times daily. 30 g 2  ? triamcinolone ointment (KENALOG) 0.1 % Apply 1 application topically 2 (two) times daily as needed (rash flare). Do not use on the face, neck, armpits or groin area. Do not use more than 3 weeks in a row. 80 g 0  ? ?No facility-administered medications prior to visit.  ? ? ?Allergies  ?Allergen Reactions  ? Codeine Other (See Comments)  ?  "makes me crazy"  ? Hornet Venom Anaphylaxis  ? Sulfa Antibiotics Anaphylaxis  ?  "died 3 times".  ? Doxycycline Other (See Comments)  ?  "Yeast infection"  ? Oxycodone   ? Amoxicillin Rash  ? ? ?ROS ?See HPI ?   ?Objective:  ?  ?Physical Exam ?Constitutional:   ?   General: She is not in acute distress. ?   Appearance: Normal appearance. She is not ill-appearing.  ?HENT:  ?   Head: Normocephalic and atraumatic.  ?   Right Ear: External ear normal.  ?   Left Ear: External ear normal.  ?Eyes:  ?   Extraocular Movements: Extraocular movements intact.  ?   Pupils: Pupils are equal, round, and reactive to light.  ?Cardiovascular:  ?   Rate and Rhythm: Normal rate and regular rhythm.  ?   Heart sounds: Normal heart sounds. No murmur heard. ?  No gallop.  ?Pulmonary:  ?   Effort: Pulmonary effort is normal. No respiratory distress.  ?   Breath sounds: Normal breath sounds. No wheezing or rales.  ?Skin: ?   General: Skin is warm and dry.  ?Neurological:  ?   Mental Status: She is alert and oriented to person, place, and time.  ?Psychiatric:     ?   Mood and Affect: Mood normal.     ?   Behavior: Behavior normal.     ?   Judgment: Judgment normal.  ? ? ?BP 140/72   Pulse 66   Temp 98.1 ?F  (36.7 ?C) (Oral)   Resp 16   Wt 186 lb (84.4 kg)   SpO2 99%   BMI 30.02 kg/m?  ?Wt Readings from Last 3 Encounters:  ?07/01/21 186 lb (84.4 kg)  ?02/24/21 185 lb 12.8 oz (84.3 kg)  ?12/30/20 187 lb (84.8 kg)  ? ?

## 2021-07-01 NOTE — Assessment & Plan Note (Signed)
Vivotif is <78 years old.  Pt given printed rx's for malarone and vaxhora and instructions on when to take these.  She will update her Yellow Fever at the Health Department.  Hep A is up to date.   ?

## 2021-07-01 NOTE — Patient Instructions (Addendum)
Please complete lab work prior to leaving.  ?Please update your yellow fever vaccine at the health department. ?If you are travelling to any of the following places in [place] you should also receive cholera vaccination: ? ?Areas of active cholera transmission are localized to the counties of [place] (last case reported in the past 3 months), [place] (last case reported in the past 3 months), [place] (last case reported in the past 3 months), [place] (last case reported in the past 3 months), [place] (last case reported 9-12 months ago), [place] (last case reported in the past 3 months), [place] (last case reported in the past 3 months), [place] (last case reported in the past 3 months), [place] (last case reported in the past 3 months), [place] (last case reported in the past 3 months), [place] (last case reported in the past 3 months), [place] (last case reported in the past 3 months), [place] (last case reported in the past 3 months), [place] (last case reported in the past 3 months), [place] (last case reported 3-6 months ago), [place] (last case reported in the past 3 months) and [place] (last case reported in the past 3 months) in [place].  ?

## 2021-08-18 ENCOUNTER — Ambulatory Visit: Payer: Medicare PPO | Admitting: Family

## 2021-08-18 ENCOUNTER — Encounter: Payer: Self-pay | Admitting: Family

## 2021-08-18 VITALS — BP 144/72 | HR 68 | Temp 98.1°F | Ht 65.75 in | Wt 187.2 lb

## 2021-08-18 DIAGNOSIS — J3089 Other allergic rhinitis: Secondary | ICD-10-CM

## 2021-08-18 DIAGNOSIS — J4540 Moderate persistent asthma, uncomplicated: Secondary | ICD-10-CM

## 2021-08-18 DIAGNOSIS — J019 Acute sinusitis, unspecified: Secondary | ICD-10-CM

## 2021-08-18 DIAGNOSIS — H1013 Acute atopic conjunctivitis, bilateral: Secondary | ICD-10-CM

## 2021-08-18 DIAGNOSIS — H1010 Acute atopic conjunctivitis, unspecified eye: Secondary | ICD-10-CM

## 2021-08-18 DIAGNOSIS — J302 Other seasonal allergic rhinitis: Secondary | ICD-10-CM

## 2021-08-18 MED ORDER — EPINEPHRINE 0.3 MG/0.3ML IJ SOSY: PREFILLED_SYRINGE | INTRAMUSCULAR | 1 refills | Status: DC

## 2021-08-18 MED ORDER — AZELASTINE HCL 0.1 % NA SOLN: 2.0000 | Freq: Two times a day (BID) | NASAL | 5 refills | Status: DC

## 2021-08-18 MED ORDER — BUDESONIDE-FORMOTEROL FUMARATE 160-4.5 MCG/ACT IN AERO: 2.0000 | INHALATION_SPRAY | Freq: Two times a day (BID) | RESPIRATORY_TRACT | 5 refills | Status: DC

## 2021-08-18 MED ORDER — FLUTICASONE PROPIONATE 50 MCG/ACT NA SUSP: 2.0000 | Freq: Every day | NASAL | 5 refills | Status: DC

## 2021-08-18 MED ORDER — ALBUTEROL SULFATE HFA 108 (90 BASE) MCG/ACT IN AERS: 2.0000 | INHALATION_SPRAY | RESPIRATORY_TRACT | 1 refills | Status: DC | PRN

## 2021-08-18 MED ORDER — DOXYCYCLINE MONOHYDRATE 100 MG PO TABS: 100.0000 mg | ORAL_TABLET | Freq: Two times a day (BID) | ORAL | 0 refills | Status: AC

## 2021-08-18 MED ORDER — FLUCONAZOLE 100 MG PO TABS: ORAL_TABLET | ORAL | 0 refills | Status: DC

## 2021-08-18 NOTE — Progress Notes (Signed)
? ?[address] ?[place] [address] ?Dept: [phone] ? ?FOLLOW UP NOTE ? ?Patient ID: Vicki Dennis, female    DOB: [birth year]  Age: 76 y.o. MRN: [id number] ?Date of Office Visit: 08/18/2021 ? ?Assessment  ?Chief Complaint: Follow-up ? ?HPI ?Vicki Dennis is a 76 year old female who presents today for an acute visit of sinus infection.  She was last seen on February 24, 2021 by Dr. Selena Batten for fire ant bite. ? ?She reports approximately 2 weeks ago she developed a raspy voice that sometimes better than other days, stopped up sinuses that are swollen, sneezing out yellow-green rhinorrhea,  coughing up yellow-green rhinorrhea.and postnasal drip that causes her to cough.  She denies fever, chills, body aches, or sick contacts.  She has not had any sinus infections since we last saw her.  She currently takes azelastine nasal spray and does not have Flonase nasal spray.  She would like a prescription for this.  Allergic conjunctivitis is reported as moderately controlled with eyedrops as needed.  She reports occasional itchy watery eyes ? ?Moderate persistent asthma is reported as moderately controlled with Symbicort 160/4.5 mcg 1 puff twice a day without a spacer.  Instructed the importance of using a spacer daily to help get the medicine down in the lungs.  She reports she does have a spacer at home and she will use it.  Demonstration given on how to use a spacer.  She reports coughing due to postnasal drip and a little bit of wheezing.  The coughing will also wake her up at night.  She denies tightness in chest and shortness of breath.  Since her last office visit she has not required any systemic steroids or made any trips to the emergency room or urgent care due to breathing problems.  She has not had to use her albuterol inhaler. ? ? ?Drug Allergies:  ?Allergies  ?Allergen Reactions  ? Codeine Other (See Comments)  ?  "makes me crazy"  ? Hornet Venom Anaphylaxis  ? Sulfa Antibiotics Anaphylaxis  ?  "died  3 times".  ? Doxycycline Other (See Comments)  ?  "Yeast infection"  ? Oxycodone   ? Amoxicillin Rash  ? ? ?Review of Systems: ?Review of Systems  ?Constitutional:  Negative for chills and fever.  ?HENT:    ?     Reports rhinorrhea that is yellow-green, postnasal drip, and nasal congestion  ?Eyes:   ?     Reports itchy watery eyes at times  ?Respiratory:  Positive for cough and wheezing. Negative for shortness of breath.   ?     Reports cough due to postnasal drip and a little bit of wheezing.  The cough also wakes her up at night  ?Cardiovascular:  Negative for chest pain and palpitations.  ?Gastrointestinal:   ?     Reports reflux at times and denies heartburn  ?Genitourinary:  Negative for frequency.  ?Skin:  Negative for itching and rash.  ?Neurological:  Negative for headaches.  ?Endo/Heme/Allergies:  Positive for environmental allergies.  ? ? ?Physical Exam: ?BP (!) 144/72   Pulse 68   Temp 98.1 ?F (36.7 ?C) (Temporal)   Ht 5' 5.75" (1.67 m)   Wt 187 lb 3.2 oz (84.9 kg)   SpO2 97%   BMI 30.45 kg/m?   ? ?Physical Exam ?Constitutional:   ?   Appearance: Normal appearance.  ?HENT:  ?   Head: Normocephalic and atraumatic.  ?   Comments: Pharynx normal, eyes normal, ears normal, nose:  Bilateral lower turbinates mildly edematous and slightly erythematous with no drainage noted ?   Right Ear: Tympanic membrane, ear canal and external ear normal.  ?   Left Ear: Tympanic membrane, ear canal and external ear normal.  ?   Mouth/Throat:  ?   Mouth: Mucous membranes are moist.  ?   Pharynx: Oropharynx is clear.  ?Eyes:  ?   Conjunctiva/sclera: Conjunctivae normal.  ?Cardiovascular:  ?   Rate and Rhythm: Normal rate and regular rhythm.  ?   Heart sounds: Normal heart sounds.  ?Pulmonary:  ?   Effort: Pulmonary effort is normal.  ?   Breath sounds: Normal breath sounds.  ?   Comments: Lungs clear to auscultation ?Musculoskeletal:  ?   Cervical back: Neck supple.  ?Skin: ?   General: Skin is warm.  ?Neurological:  ?    Mental Status: She is alert and oriented to person, place, and time.  ?Psychiatric:     ?   Mood and Affect: Mood normal.     ?   Behavior: Behavior normal.     ?   Thought Content: Thought content normal.     ?   Judgment: Judgment normal.  ? ? ?Diagnostics: ?FVC 2.54 L (107%), FEV1 1.79 L (98%).  Predicted FVC 2.38 L, predicted FEV1 1.2 L.  Spirometry indicates normal respiratory function. ? ?Assessment and Plan: ?1. Acute non-recurrent sinusitis, unspecified location   ?2. Moderate persistent asthma without complication   ?3. Seasonal and perennial allergic rhinitis   ?4. Seasonal allergic conjunctivitis   ? ? ?Meds ordered this encounter  ?Medications  ? fluticasone (FLONASE) 50 MCG/ACT nasal spray  ?  Sig: Place 2 sprays into both nostrils daily.  ?  Dispense:  16 g  ?  Refill:  5  ? azelastine (ASTELIN) 0.1 % nasal spray  ?  Sig: Place 2 sprays into both nostrils 2 (two) times daily.  ?  Dispense:  30 mL  ?  Refill:  5  ? budesonide-formoterol (SYMBICORT) 160-4.5 MCG/ACT inhaler  ?  Sig: Inhale 2 puffs into the lungs in the morning and at bedtime.  ?  Dispense:  10.2 g  ?  Refill:  5  ? albuterol (PROVENTIL HFA) 108 (90 Base) MCG/ACT inhaler  ?  Sig: Inhale 2 puffs into the lungs every 4 (four) hours as needed for wheezing or shortness of breath.  ?  Dispense:  18 g  ?  Refill:  1  ? EPINEPHrine 0.3 MG/0.3ML SOSY  ?  Sig: Use as directed for severe allergic reactions  ?  Dispense:  2 each  ?  Refill:  1  ? doxycycline (ADOXA) 100 MG tablet  ?  Sig: Take 1 tablet (100 mg total) by mouth 2 (two) times daily for 7 days.  ?  Dispense:  14 tablet  ?  Refill:  0  ? fluconazole (DIFLUCAN) 100 MG tablet  ?  Sig: Take 1 tablet at first sign of symptoms and may repeat 3 days later if needed.  ?  Dispense:  2 tablet  ?  Refill:  0  ? ? ?Patient Instructions  ? ?Moderate persistent asthma ?Continue Symbicort 160/4.5 mcg 1-2 puffs 1-2 times a day to help prevent cough and wheeze ?May use albuterol 2 puffs every 4-6 hours  as needed for cough,wheeze, tightness in chest, or shortness of breath. May also use albuterol 2 puffs 5-15 minutes prior to exercise ?Asthma control goals:  ?Full participation in all desired activities (may need  albuterol before activity) ?Albuterol use two time or less a week on average (not counting use with activity) ?Cough interfering with sleep two time or less a month ?Oral steroids no more than once a year ?No hospitalizations ? ?Seasonal and perennial allergic rhinitis (grasses, weeds, mold, and dust mite) ?Continue allergen avoidance measures as listed below ?Continue saline nasal rinses as needed for nasal symptoms. Use this before any medicated nasal sprays for best result ?Start Flonase 2 sprays in each nostril once a day.  We will send in a prescription for this ?Continue azelastine nasal spray 2 sprays in each nostril twice a day to help with drainage ?Continue cetirizine 10 mg once a day as needed for a runny nose ? ? ?Allergic conjunctivitis ?Continue olopatadine eye drops one drop in each eye once a day as needed for red or itchy eyes ? ?Acute sinus infection ?Start doxycycline 100 mg taking 1 tablet twice a day for 7 days ?We will send in a prescription for diflucan ? ?Call the clinic if this treatment plan is not working well for you ? ?Follow up in 3 months or sooner if needed. ? ?Reducing Pollen Exposure ?The American Academy of Allergy, Asthma and Immunology suggests the following steps to reduce your exposure to pollen during allergy seasons. ?Do not hang sheets or clothing out to dry; pollen may collect on these items. ?Do not mow lawns or spend time around freshly cut grass; mowing stirs up pollen. ?Keep windows closed at night.  Keep car windows closed while driving. ?Minimize morning activities outdoors, a time when pollen counts are usually at their highest. ?Stay indoors as much as possible when pollen counts or humidity is high and on windy days when pollen tends to remain in the air  longer. ?Use air conditioning when possible.  Many air conditioners have filters that trap the pollen spores. ?Use a HEPA room air filter to remove pollen form the indoor air you breathe. ? ?Control of Mold

## 2021-08-18 NOTE — Patient Instructions (Addendum)
?Moderate persistent asthma ?Continue Symbicort 160/4.5 mcg 1-2 puffs 1-2 times a day to help prevent cough and wheeze ?May use albuterol 2 puffs every 4-6 hours as needed for cough,wheeze, tightness in chest, or shortness of breath. May also use albuterol 2 puffs 5-15 minutes prior to exercise ?Asthma control goals:  ?Full participation in all desired activities (may need albuterol before activity) ?Albuterol use two time or less a week on average (not counting use with activity) ?Cough interfering with sleep two time or less a month ?Oral steroids no more than once a year ?No hospitalizations ? ?Seasonal and perennial allergic rhinitis (grasses, weeds, mold, and dust mite) ?Continue allergen avoidance measures as listed below ?Continue saline nasal rinses as needed for nasal symptoms. Use this before any medicated nasal sprays for best result ?Start Flonase 2 sprays in each nostril once a day.  We will send in a prescription for this ?Continue azelastine nasal spray 2 sprays in each nostril twice a day to help with drainage ?Continue cetirizine 10 mg once a day as needed for a runny nose ? ? ?Allergic conjunctivitis ?Continue olopatadine eye drops one drop in each eye once a day as needed for red or itchy eyes ? ?Acute sinus infection ?Start doxycycline 100 mg taking 1 tablet twice a day for 7 days ?We will send in a prescription for diflucan ? ?Call the clinic if this treatment plan is not working well for you ? ?Follow up in 3 months or sooner if needed. ? ?Reducing Pollen Exposure ?The American Academy of Allergy, Asthma and Immunology suggests the following steps to reduce your exposure to pollen during allergy seasons. ?Do not hang sheets or clothing out to dry; pollen may collect on these items. ?Do not mow lawns or spend time around freshly cut grass; mowing stirs up pollen. ?Keep windows closed at night.  Keep car windows closed while driving. ?Minimize morning activities outdoors, a time when pollen  counts are usually at their highest. ?Stay indoors as much as possible when pollen counts or humidity is high and on windy days when pollen tends to remain in the air longer. ?Use air conditioning when possible.  Many air conditioners have filters that trap the pollen spores. ?Use a HEPA room air filter to remove pollen form the indoor air you breathe. ? ?Control of Mold Allergen ?Mold and fungi can grow on a variety of surfaces provided certain temperature and moisture conditions exist.  Outdoor molds grow on plants, decaying vegetation and soil.  The major outdoor mold, Alternaria and Cladosporium, are found in very high numbers during hot and dry conditions.  Generally, a late Summer - Fall peak is seen for common outdoor fungal spores.  Rain will temporarily lower outdoor mold spore count, but counts rise rapidly when the rainy period ends.  The most important indoor molds are Aspergillus and Penicillium.  Dark, humid and poorly ventilated basements are ideal sites for mold growth.  The next most common sites of mold growth are the bathroom and the kitchen. ? ?Outdoor [employer] ?Use air conditioning and keep windows closed ?Avoid exposure to decaying vegetation. ?Avoid leaf raking. ?Avoid grain handling. ?Consider wearing a face mask if working in moldy areas. ? ?Indoor Mold Control ?Maintain humidity below 50%. ?Clean washable surfaces with 5% bleach solution. ?Remove sources e.g. Contaminated carpets. ? ? ?Control of Dust Mite Allergen ?Dust mites play a major role in allergic asthma and rhinitis. They occur in environments with high humidity wherever human skin is found.  Dust mites absorb humidity from the atmosphere (ie, they do not drink) and feed on organic matter (including shed human and animal skin). Dust mites are a microscopic type of insect that you cannot see with the naked eye. High levels of dust mites have been detected from mattresses, pillows, carpets, upholstered furniture, bed covers,  clothes, soft toys and any woven material. The principal allergen of the dust mite is found in its feces. A gram of dust may contain 1,000 mites and 250,000 fecal particles. Mite antigen is easily measured in the air during house cleaning activities. Dust mites do not bite and do not cause harm to humans, other than by triggering allergies/asthma. ? ?Ways to decrease your exposure to dust mites in your home: ? ?1. Encase mattresses, box springs and pillows with a mite-impermeable barrier or cover ? ?2. Wash sheets, blankets and drapes weekly in hot water (130? F) with detergent and dry them in a dryer on the hot setting. ? ?3. Have the room cleaned frequently with a vacuum cleaner and a damp dust-mop. For carpeting or rugs, vacuuming with a vacuum cleaner equipped with a high-efficiency particulate air (HEPA) filter. The dust mite allergic individual should not be in a room which is being cleaned and should wait 1 hour after cleaning before going into the room. ? ?4. Do not sleep on upholstered furniture (eg, couches). ? ?5. If possible removing carpeting, upholstered furniture and drapery from the home is ideal. Horizontal blinds should be eliminated in the rooms where the person spends the most time (bedroom, study, television room). Washable vinyl, roller-type shades are optimal. ? ?6. Remove all non-washable stuffed toys from the bedroom. Wash stuffed toys weekly like sheets and blankets above. ? ?7. Reduce indoor humidity to less than 50%. Inexpensive humidity monitors can be purchased at most hardware stores. Do not use a humidifier as can make the problem worse and are not recommended. ? ?

## 2021-08-29 ENCOUNTER — Other Ambulatory Visit: Payer: Self-pay | Admitting: Family Medicine

## 2021-09-05 ENCOUNTER — Other Ambulatory Visit: Payer: Self-pay | Admitting: Family Medicine

## 2021-09-05 ENCOUNTER — Telehealth: Payer: Self-pay | Admitting: Family Medicine

## 2021-09-05 MED ORDER — DIPHENOXYLATE-ATROPINE 2.5-0.025 MG PO TABS: 1.0000 | ORAL_TABLET | Freq: Four times a day (QID) | ORAL | 0 refills | Status: AC | PRN

## 2021-09-05 NOTE — Telephone Encounter (Signed)
Pt called stating that she would like something to treat diarrhea as needed stronger than OTC. Pt is traveling outside of the country [place] a month and needs something just in case. Pt prefers a call back.

## 2021-09-05 NOTE — Telephone Encounter (Signed)
Pt has been notified.

## 2021-09-22 ENCOUNTER — Other Ambulatory Visit (HOSPITAL_BASED_OUTPATIENT_CLINIC_OR_DEPARTMENT_OTHER): Payer: Self-pay

## 2021-09-24 ENCOUNTER — Other Ambulatory Visit (HOSPITAL_BASED_OUTPATIENT_CLINIC_OR_DEPARTMENT_OTHER): Payer: Self-pay

## 2021-09-24 MED ORDER — AZITHROMYCIN 250 MG PO TABS
ORAL_TABLET | ORAL | 0 refills | Status: DC
  Filled 2021-09-24: qty 6, 5d supply, fill #0

## 2021-11-17 DIAGNOSIS — H5789 Other specified disorders of eye and adnexa: Secondary | ICD-10-CM | POA: Diagnosis not present

## 2021-11-17 DIAGNOSIS — W57XXXA Bitten or stung by nonvenomous insect and other nonvenomous arthropods, initial encounter: Secondary | ICD-10-CM | POA: Diagnosis not present

## 2021-11-20 ENCOUNTER — Other Ambulatory Visit (HOSPITAL_BASED_OUTPATIENT_CLINIC_OR_DEPARTMENT_OTHER): Payer: Self-pay

## 2021-11-20 ENCOUNTER — Ambulatory Visit: Payer: Medicare PPO | Admitting: Family

## 2021-11-20 VITALS — BP 146/78 | HR 66 | Temp 98.3°F | Resp 16 | Wt 180.0 lb

## 2021-11-20 DIAGNOSIS — R197 Diarrhea, unspecified: Secondary | ICD-10-CM

## 2021-11-20 MED ORDER — CIPROFLOXACIN HCL 500 MG PO TABS
500.0000 mg | ORAL_TABLET | Freq: Two times a day (BID) | ORAL | 0 refills | Status: AC
Start: 2021-11-20 — End: 2021-11-23
  Filled 2021-11-20: qty 6, 3d supply, fill #0

## 2021-11-20 NOTE — Progress Notes (Signed)
Subjective:     Patient ID: Vicki Dennis, female    DOB: 02-07-1946, 76 y.o.   MRN: [id number]  Chief Complaint  Patient presents with   Diarrhea    Complains of diarrhea for almost 2 weeks    Diarrhea     Patient recently travelled to [place].  She returned home on 11/11/21.  Within 2 days of returning home she developed diarrhea.  Took zpak that she was given by her dentist..  Notes some mild loose stool.  She has been using lomotil which stops her BM's.    Health Maintenance Due  Topic Date Due   OPHTHALMOLOGY EXAM  Never done   FOOT EXAM  06/15/2017   COVID-19 Vaccine (5 - Moderna risk series) 11/06/2020   INFLUENZA VACCINE  11/11/2021    Past Medical History:  Diagnosis Date   Allergy    Anemia    h/o   Asthma    Asthma, mild intermittent, well-controlled 02/10/2014   Blood transfusion without reported diagnosis    Chicken pox as a child   Diabetes mellitus    type-II, per Dr. Parke Simmers, no meds. , pt. reports weight loss has contributed  to control of diabetes.  She reports last HgbA1c- wnl.   Diabetes mellitus 11/05/2010   Diabetes mellitus type 2 in obese (HCC) 11/05/2010   Diabetes mellitus type 2, uncontrolled 11/05/2010   Esophageal reflux 02/10/2014   GERD (gastroesophageal reflux disease)    rare use of OTC aid   Hyperglycemia 09/02/2015   Hyperlipidemia    Hypertension    Measles as a child   Measles    Medicare annual wellness visit, subsequent 08/06/2014   Mumps as a child   Obesity 02/10/2014   Pneumonia    as a child   Portal vein thrombosis 03/09/2011   coumadin ended summer of 2013   Preventative health care 08/12/2014   Rash and nonspecific skin eruption 01/07/2015   Seborrheic keratosis 02/10/2014   Follows with dermatology   Sinusitis, acute 03/23/2016    Past Surgical History:  Procedure Laterality Date   BREAST BIOPSY Bilateral    INSERTION OF MESH  03/16/2012   Procedure: INSERTION OF MESH;  Surgeon: Shelly Rubenstein, MD;   Location: [facility] OR;  Service: General;  Laterality: N/A;   KNEE ARTHROSCOPY W/ MENISCAL REPAIR     both knees   TONSILLECTOMY     UMBILICAL HERNIA REPAIR  03/16/2012   Procedure: HERNIA REPAIR UMBILICAL ADULT;  Surgeon: Shelly Rubenstein, MD;  Location: [facility] OR;  Service: General;  Laterality: N/A;    Family History  Problem Relation Age of Onset   Cancer Father        prostate   Aneurysm Mother 83       brain   Cancer Maternal Aunt     Social History   Socioeconomic History   Marital status: Divorced    Spouse name: Not on file   Number of children: Not on file   Years of education: Not on file   Highest education level: Not on file  Occupational History   Not on file  Tobacco Use   Smoking status: Never   Smokeless tobacco: Never   Tobacco comments:    never used tobacco  Vaping Use   Vaping Use: Never used  Substance and Sexual Activity   Alcohol use: Yes    Alcohol/week: 0.0 standard drinks of alcohol    Comment: for holidays   Drug use: No  Sexual activity: Not Currently    Comment: lives alone   Other Topics Concern   Not on file  Social History Narrative   Retired Runner, broadcasting/film/video   Social Determinants of Corporate investment banker Strain: Not on file  Food Insecurity: Not on file  Transportation Needs: Not on file  Physical Activity: Not on file  Stress: Not on file  Social Connections: Not on file  Intimate Partner Violence: Not on file    Outpatient Medications Prior to Visit  Medication Sig Dispense Refill   ACCU-CHEK AVIVA PLUS test strip Use to check blood sugar once daily as directed 100 each 3   albuterol (PROVENTIL HFA) 108 (90 Base) MCG/ACT inhaler Inhale 2 puffs into the lungs every 4 (four) hours as needed for wheezing or shortness of breath. 18 g 1   atovaquone-proguanil (MALARONE) 250-100 MG TABS tablet Take 1 tablet by mouth once daily beginning 2 days prior to your trip. Continue once daily during trip and for 7 days after returning home. 50  tablet 0   azelastine (ASTELIN) 0.1 % nasal spray Place 2 sprays into both nostrils 2 (two) times daily. 30 mL 5   blood glucose meter kit and supplies KIT Dispense based on patient and insurance preference. Use up to four times daily as directed. (FOR ICD-9 250.00, 250.01). 1 each 0   budesonide-formoterol (SYMBICORT) 160-4.5 MCG/ACT inhaler Inhale 2 puffs into the lungs in the morning and at bedtime. 10.2 g 5   diphenoxylate-atropine (LOMOTIL) 2.5-0.025 MG tablet Take 1 tablet by mouth 4 (four) times daily as needed for diarrhea or loose stools. 30 tablet 0   enalapril (VASOTEC) 2.5 MG tablet TAKE ONE TABLET BY MOUTH ONE TIME DAILY 90 tablet 1   EPINEPHrine 0.3 MG/0.3ML SOSY Use as directed for severe allergic reactions 2 each 1   fluticasone (FLONASE) 50 MCG/ACT nasal spray Place 2 sprays into both nostrils daily. 16 g 5   Lancets 30G MISC Use as directed once daily to check blood sugar.  Diagnosis code E11.9 100 each 5   montelukast (SINGULAIR) 10 MG tablet Take 1 tablet (10 mg total) by mouth at bedtime. 90 tablet 5   Multiple Vitamins-Minerals (MULTIVITAMIN WITH MINERALS) tablet Take 1 tablet by mouth daily.     Omega-3 Fatty Acids (FISH OIL CONCENTRATE) 1000 MG CAPS Take by mouth 3 (three) times daily.     rosuvastatin (CRESTOR) 5 MG tablet Take 1 tablet (5 mg total) by mouth daily. 90 tablet 0   azithromycin (ZITHROMAX) 250 MG tablet Take 2 tablets by mouth today then take 1 tablet by mouth everyday for 4 days 6 tablet 0   Cholera Vac Live Attenuated (VAXCHORA) SUSR At least 10 days prior to your departure (Patient not taking: Reported on 08/18/2021) 100 mL 0   fluconazole (DIFLUCAN) 100 MG tablet Take 1 tablet at first sign of symptoms and may repeat 3 days later if needed. 2 tablet 0   No facility-administered medications prior to visit.    Allergies  Allergen Reactions   Codeine Other (See Comments)    "makes me crazy"   Hornet Venom Anaphylaxis   Sulfa Antibiotics Anaphylaxis     "died 3 times".   Doxycycline Other (See Comments)    "Yeast infection"   Oxycodone    Amoxicillin Rash    Review of Systems  Gastrointestinal:  Positive for diarrhea.   See HPI    Objective:    Physical Exam Constitutional:      General: She  is not in acute distress.    Appearance: Normal appearance. She is well-developed.  HENT:     Head: Normocephalic and atraumatic.     Right Ear: External ear normal.     Left Ear: External ear normal.  Eyes:     General: No scleral icterus. Neck:     Thyroid: No thyromegaly.  Cardiovascular:     Rate and Rhythm: Normal rate and regular rhythm.     Heart sounds: Normal heart sounds. No murmur heard. Pulmonary:     Effort: Pulmonary effort is normal. No respiratory distress.     Breath sounds: Normal breath sounds. No wheezing.  Abdominal:     Comments: + ventral hernia  Musculoskeletal:     Cervical back: Neck supple.  Skin:    General: Skin is warm and dry.  Neurological:     Mental Status: She is alert and oriented to person, place, and time.  Psychiatric:        Mood and Affect: Mood normal.        Behavior: Behavior normal.        Thought Content: Thought content normal.        Judgment: Judgment normal.     BP (!) 146/78 (BP Location: Right Arm, Patient Position: Sitting, Cuff Size: Small)   Pulse 66   Temp 98.3 F (36.8 C) (Oral)   Resp 16   Wt 180 lb (81.6 kg)   SpO2 99%   BMI 29.28 kg/m  Wt Readings from Last 3 Encounters:  11/20/21 180 lb (81.6 kg)  08/18/21 187 lb 3.2 oz (84.9 kg)  07/01/21 186 lb (84.4 kg)       Assessment & Plan:   Problem List Items Addressed This Visit       Unprioritized   Diarrhea - Primary    New. Will rx empirically with cipro.  Obtain stool studies. Pt is advised to discontinue lomotil and only use prn.  She has been using Q6 hr and has not had a BM recently. She is advised to call if symptoms worsen or if symptom do not improve.       Relevant Orders   Stool Culture    Ova and parasite examination   Clostridium Difficile by PCR([facility])    I have discontinued Fransico Him. Rocha's Vaxchora, fluconazole, and azithromycin. I am also having her start on ciprofloxacin. Additionally, I am having her maintain her multivitamin with minerals, Fish Oil Concentrate, Lancets 30G, blood glucose meter kit and supplies, montelukast, Accu-Chek Aviva Plus, rosuvastatin, atovaquone-proguanil, fluticasone, azelastine, budesonide-formoterol, albuterol, EPINEPHrine, enalapril, and diphenoxylate-atropine.  Meds ordered this encounter  Medications   ciprofloxacin (CIPRO) 500 MG tablet    Sig: Take 1 tablet (500 mg total) by mouth 2 (two) times daily for 3 days.    Dispense:  6 tablet    Refill:  0    Order Specific Question:   Supervising Provider    Answer:   Danise Edge A [[id number]]

## 2021-11-20 NOTE — Patient Instructions (Signed)
Please complete stool kits and return at your earliest convenience. Call if symptoms worsen or if symptoms do not improve.

## 2021-11-20 NOTE — Assessment & Plan Note (Signed)
New. Will rx empirically with cipro.  Obtain stool studies. Pt is advised to discontinue lomotil and only use prn.  She has been using Q6 hr and has not had a BM recently. She is advised to call if symptoms worsen or if symptom do not improve.

## 2021-11-21 ENCOUNTER — Other Ambulatory Visit (INDEPENDENT_AMBULATORY_CARE_PROVIDER_SITE_OTHER): Payer: Medicare PPO

## 2021-11-21 DIAGNOSIS — R197 Diarrhea, unspecified: Secondary | ICD-10-CM | POA: Diagnosis not present

## 2021-11-24 LAB — CLOSTRIDIUM DIFFICILE BY PCR: Toxigenic C. Difficile by PCR: NEGATIVE

## 2021-11-25 ENCOUNTER — Telehealth: Payer: Self-pay | Admitting: Family

## 2021-11-25 ENCOUNTER — Telehealth: Payer: Self-pay

## 2021-11-25 LAB — STOOL CULTURE: E coli, Shiga toxin Assay: NEGATIVE

## 2021-11-25 NOTE — Telephone Encounter (Signed)
Patient states no problems since last appt. Everything is looking good!

## 2021-11-25 NOTE — Telephone Encounter (Signed)
Patient came into office , lab letter printed and given to patient

## 2021-11-26 ENCOUNTER — Telehealth: Payer: Self-pay | Admitting: Family Medicine

## 2021-11-26 NOTE — Telephone Encounter (Signed)
Pt would like 8/11 results mailed to her. She stated she has already gone over one of the results but it waiting on two of them. She was not sure which one was already gone over.

## 2021-11-28 NOTE — Telephone Encounter (Signed)
Labs mailed out to pt

## 2021-12-01 ENCOUNTER — Ambulatory Visit: Payer: Medicare PPO | Admitting: Family

## 2021-12-02 ENCOUNTER — Ambulatory Visit: Payer: Medicare PPO | Admitting: Family Medicine

## 2021-12-02 LAB — OVA AND PARASITE EXAMINATION
CONCENTRATE RESULT:: NONE SEEN
MICRO NUMBER:: 13767724
SPECIMEN QUALITY:: ADEQUATE
TRICHROME RESULT:: NONE SEEN

## 2021-12-04 ENCOUNTER — Ambulatory Visit: Payer: Medicare PPO | Admitting: Family Medicine

## 2021-12-04 VITALS — BP 136/72 | HR 69 | Temp 97.6°F | Resp 16 | Ht 66.0 in | Wt 181.6 lb

## 2021-12-04 DIAGNOSIS — E782 Mixed hyperlipidemia: Secondary | ICD-10-CM

## 2021-12-04 DIAGNOSIS — J4520 Mild intermittent asthma, uncomplicated: Secondary | ICD-10-CM

## 2021-12-04 DIAGNOSIS — E669 Obesity, unspecified: Secondary | ICD-10-CM

## 2021-12-04 DIAGNOSIS — K219 Gastro-esophageal reflux disease without esophagitis: Secondary | ICD-10-CM | POA: Diagnosis not present

## 2021-12-04 DIAGNOSIS — E1169 Type 2 diabetes mellitus with other specified complication: Secondary | ICD-10-CM

## 2021-12-04 NOTE — Assessment & Plan Note (Signed)
Encourage heart healthy diet such as MIND or DASH diet, increase exercise, avoid trans fats, simple carbohydrates and processed foods, consider a krill or fish or flaxseed oil cap daily.  Tolerating Rosuvastatin 

## 2021-12-04 NOTE — Assessment & Plan Note (Signed)
Avoid offending foods, start probiotics. Do not eat large meals in late evening and consider raising head of bed.  

## 2021-12-04 NOTE — Assessment & Plan Note (Signed)
Well controlled no recent flares

## 2021-12-04 NOTE — Patient Instructions (Addendum)
[place]er[place] Matte tea do not drink  RSV (respiraory Syncitial virus) vaccine once  New Covid shot after October   Cholesterol Content in Foods Cholesterol is a waxy, fat-like substance that helps to carry fat in the blood. The body needs cholesterol in small amounts, but too much cholesterol can cause damage to the arteries and heart. What foods have cholesterol?  Cholesterol is found in animal-based foods, such as meat, seafood, and dairy. Generally, low-fat dairy and lean meats have less cholesterol than full-fat dairy and fatty meats. The milligrams of cholesterol per serving (mg per serving) of common cholesterol-containing foods are listed below. Meats and other proteins Egg -- one large whole egg has 186 mg. Veal shank -- 4 oz (113 g) has 141 mg. Lean ground [place] (93% lean) -- 4 oz (113 g) has 118 mg. Fat-trimmed lamb loin -- 4 oz (113 g) has 106 mg. Lean ground beef (90% lean) -- 4 oz (113 g) has 100 mg. Lobster -- 3.5 oz (99 g) has 90 mg. Pork loin chops -- 4 oz (113 g) has 86 mg. Canned salmon -- 3.5 oz (99 g) has 83 mg. Fat-trimmed beef top loin -- 4 oz (113 g) has 78 mg. Frankfurter -- 1 frank (3.5 oz or 99 g) has 77 mg. Crab -- 3.5 oz (99 g) has 71 mg. Roasted chicken without skin, white meat -- 4 oz (113 g) has 66 mg. Light bologna -- 2 oz (57 g) has 45 mg. Deli-cut [place] -- 2 oz (57 g) has 31 mg. Canned tuna -- 3.5 oz (99 g) has 31 mg. Berniece Salines -- 1 oz (28 g) has 29 mg. Oysters and mussels (raw) -- 3.5 oz (99 g) has 25 mg. Mackerel -- 1 oz (28 g) has 22 mg. Trout -- 1 oz (28 g) has 20 mg. Pork sausage -- 1 link (1 oz or 28 g) has 17 mg. Salmon -- 1 oz (28 g) has 16 mg. Tilapia -- 1 oz (28 g) has 14 mg. Dairy Soft-serve ice cream --  cup (4 oz or 86 g) has 103 mg. Whole-milk yogurt -- 1 cup (8 oz or 245 g) has 29 mg. Cheddar cheese -- 1 oz (28 g) has 28 mg. American cheese -- 1 oz (28 g) has 28 mg. Whole milk -- 1 cup (8 oz or 250 mL) has 23 mg. 2% milk -- 1 cup (8 oz  or 250 mL) has 18 mg. Cream cheese -- 1 tablespoon (Tbsp) (14.5 g) has 15 mg. Cottage cheese --  cup (4 oz or 113 g) has 14 mg. Low-fat (1%) milk -- 1 cup (8 oz or 250 mL) has 10 mg. Sour cream -- 1 Tbsp (12 g) has 8.5 mg. Low-fat yogurt -- 1 cup (8 oz or 245 g) has 8 mg. Nonfat Greek yogurt -- 1 cup (8 oz or 228 g) has 7 mg. Half-and-half cream -- 1 Tbsp (15 mL) has 5 mg. Fats and oils Cod liver oil -- 1 tablespoon (Tbsp) (13.6 g) has 82 mg. Butter -- 1 Tbsp (14 g) has 15 mg. Lard -- 1 Tbsp (12.8 g) has 14 mg. Bacon grease -- 1 Tbsp (12.9 g) has 14 mg. Mayonnaise -- 1 Tbsp (13.8 g) has 5-10 mg. Margarine -- 1 Tbsp (14 g) has 3-10 mg. The items listed above may not be a complete list of foods with cholesterol. Exact amounts of cholesterol in these foods may vary depending on specific ingredients and brands. Contact a dietitian for more information. What  foods do not have cholesterol? Most plant-based foods do not have cholesterol unless you combine them with a food that has cholesterol. Foods without cholesterol include: Grains and cereals. Vegetables. Fruits. Vegetable oils, such as olive, canola, and sunflower oil. Legumes, such as peas, beans, and lentils. Nuts and seeds. Egg whites. The items listed above may not be a complete list of foods that do not have cholesterol. Contact a dietitian for more information. Summary The body needs cholesterol in small amounts, but too much cholesterol can cause damage to the arteries and heart. Cholesterol is found in animal-based foods, such as meat, seafood, and dairy. Generally, low-fat dairy and lean meats have less cholesterol than full-fat dairy and fatty meats. This information is not intended to replace advice given to you by your health care provider. Make sure you discuss any questions you have with your health care provider. Document Revised: 08/09/2020 Document Reviewed: 08/09/2020 Elsevier Patient Education  [place].

## 2021-12-08 ENCOUNTER — Other Ambulatory Visit: Payer: Medicare PPO

## 2021-12-09 ENCOUNTER — Other Ambulatory Visit (INDEPENDENT_AMBULATORY_CARE_PROVIDER_SITE_OTHER): Payer: Medicare PPO

## 2021-12-09 DIAGNOSIS — E782 Mixed hyperlipidemia: Secondary | ICD-10-CM | POA: Diagnosis not present

## 2021-12-09 DIAGNOSIS — E1169 Type 2 diabetes mellitus with other specified complication: Secondary | ICD-10-CM | POA: Diagnosis not present

## 2021-12-09 DIAGNOSIS — E669 Obesity, unspecified: Secondary | ICD-10-CM | POA: Diagnosis not present

## 2021-12-09 DIAGNOSIS — K219 Gastro-esophageal reflux disease without esophagitis: Secondary | ICD-10-CM | POA: Diagnosis not present

## 2021-12-09 LAB — CBC
HCT: 40.4 % (ref 36.0–46.0)
Hemoglobin: 13.2 g/dL (ref 12.0–15.0)
MCHC: 32.7 g/dL (ref 30.0–36.0)
MCV: 89.3 fl (ref 78.0–100.0)
Platelets: 168 10*3/uL (ref 150.0–400.0)
RBC: 4.53 Mil/uL (ref 3.87–5.11)
RDW: 14.3 % (ref 11.5–15.5)
WBC: 5 10*3/uL (ref 4.0–10.5)

## 2021-12-09 LAB — LIPID PANEL
Cholesterol: 245 mg/dL — ABNORMAL HIGH (ref 0–200)
HDL: 69.4 mg/dL (ref >39.00)
LDL Cholesterol: 159 mg/dL — ABNORMAL HIGH (ref 0–99)
NonHDL: 175.42
Total CHOL/HDL Ratio: 4
Triglycerides: 83 mg/dL (ref 0.0–149.0)
VLDL: 16.6 mg/dL (ref 0.0–40.0)

## 2021-12-09 LAB — COMPREHENSIVE METABOLIC PANEL
ALT: 13 U/L (ref 0–35)
AST: 15 U/L (ref 0–37)
Albumin: 3.9 g/dL (ref 3.5–5.2)
Alkaline Phosphatase: 67 U/L (ref 39–117)
BUN: 16 mg/dL (ref 6–23)
CO2: 27 mEq/L (ref 19–32)
Calcium: 9.3 mg/dL (ref 8.4–10.5)
Chloride: 106 mEq/L (ref 96–112)
Creatinine, Ser: 0.72 mg/dL (ref 0.40–1.20)
GFR: 80.02 mL/min (ref >60.00)
Glucose, Bld: 103 mg/dL — ABNORMAL HIGH (ref 70–99)
Potassium: 4.5 mEq/L (ref 3.5–5.1)
Sodium: 141 mEq/L (ref 135–145)
Total Bilirubin: 0.6 mg/dL (ref 0.2–1.2)
Total Protein: 6.3 g/dL (ref 6.0–8.3)

## 2021-12-09 LAB — TSH: TSH: 1.08 u[IU]/mL (ref 0.35–5.50)

## 2021-12-09 LAB — HEMOGLOBIN A1C: Hgb A1c MFr Bld: 6.8 % — ABNORMAL HIGH (ref 4.6–6.5)

## 2021-12-10 ENCOUNTER — Other Ambulatory Visit: Payer: Self-pay

## 2021-12-10 ENCOUNTER — Other Ambulatory Visit (HOSPITAL_BASED_OUTPATIENT_CLINIC_OR_DEPARTMENT_OTHER): Payer: Self-pay

## 2021-12-10 MED ORDER — ROSUVASTATIN CALCIUM 10 MG PO TABS
10.0000 mg | ORAL_TABLET | Freq: Every day | ORAL | 3 refills | Status: DC
  Filled 2021-12-10 – 2022-09-08 (×2): qty 90, 90d supply, fill #0

## 2021-12-19 ENCOUNTER — Other Ambulatory Visit (HOSPITAL_BASED_OUTPATIENT_CLINIC_OR_DEPARTMENT_OTHER): Payer: Self-pay

## 2022-01-25 NOTE — Assessment & Plan Note (Signed)
No recent exacerbations.  

## 2022-01-25 NOTE — Progress Notes (Unsigned)
Subjective:    Patient ID: Vicki Dennis, female    DOB: [birth year], 76 y.o.   MRN: [id number]  No chief complaint on file.   HPI Patient is in today for annual preventative exam and follow up on chronic medical concerns. No recent febrile illness or acute hospitalizations. No complaints of polyuria or polydipsia  Past Medical History:  Diagnosis Date   Allergy    Anemia    h/o   Asthma    Asthma, mild intermittent, well-controlled 02/10/2014   Blood transfusion without reported diagnosis    Chicken pox as a child   Diabetes mellitus    type-II, per Dr. Parke Simmers, no meds. , pt. reports weight loss has contributed  to control of diabetes.  She reports last HgbA1c- wnl.   Diabetes mellitus 11/05/2010   Diabetes mellitus type 2 in obese (HCC) 11/05/2010   Diabetes mellitus type 2, uncontrolled 11/05/2010   Esophageal reflux 02/10/2014   GERD (gastroesophageal reflux disease)    rare use of OTC aid   Hyperglycemia 09/02/2015   Hyperlipidemia    Hypertension    Measles as a child   Measles    Medicare annual wellness visit, subsequent 08/06/2014   Mumps as a child   Obesity 02/10/2014   Pneumonia    as a child   Portal vein thrombosis 03/09/2011   coumadin ended summer of 2013   Preventative health care 08/12/2014   Rash and nonspecific skin eruption 01/07/2015   Seborrheic keratosis 02/10/2014   Follows with dermatology   Sinusitis, acute 03/23/2016    Past Surgical History:  Procedure Laterality Date   BREAST BIOPSY Bilateral    INSERTION OF MESH  03/16/2012   Procedure: INSERTION OF MESH;  Surgeon: Shelly Rubenstein, MD;  Location: [facility] OR;  Service: General;  Laterality: N/A;   KNEE ARTHROSCOPY W/ MENISCAL REPAIR     both knees   TONSILLECTOMY     UMBILICAL HERNIA REPAIR  03/16/2012   Procedure: HERNIA REPAIR UMBILICAL ADULT;  Surgeon: Shelly Rubenstein, MD;  Location: [facility] OR;  Service: General;  Laterality: N/A;    Family History  Problem Relation Age of Onset    Cancer Father        prostate   Aneurysm Mother 71       brain   Cancer Maternal Aunt     Social History   Socioeconomic History   Marital status: Divorced    Spouse name: Not on file   Number of children: Not on file   Years of education: Not on file   Highest education level: Not on file  Occupational History   Not on file  Tobacco Use   Smoking status: Never   Smokeless tobacco: Never   Tobacco comments:    never used tobacco  Vaping Use   Vaping Use: Never used  Substance and Sexual Activity   Alcohol use: Yes    Alcohol/week: 0.0 standard drinks of alcohol    Comment: for holidays   Drug use: No   Sexual activity: Not Currently    Comment: lives alone   Other Topics Concern   Not on file  Social History Narrative   Retired Runner, broadcasting/film/video   Social Determinants of Corporate investment banker Strain: Not on file  Food Insecurity: Not on file  Transportation Needs: Not on file  Physical Activity: Not on file  Stress: Not on file  Social Connections: Not on file  Intimate Partner Violence: Not on file  Outpatient Medications Prior to Visit  Medication Sig Dispense Refill   ACCU-CHEK AVIVA PLUS test strip Use to check blood sugar once daily as directed 100 each 3   albuterol (PROVENTIL HFA) 108 (90 Base) MCG/ACT inhaler Inhale 2 puffs into the lungs every 4 (four) hours as needed for wheezing or shortness of breath. 18 g 1   atovaquone-proguanil (MALARONE) 250-100 MG TABS tablet Take 1 tablet by mouth once daily beginning 2 days prior to your trip. Continue once daily during trip and for 7 days after returning home. 50 tablet 0   azelastine (ASTELIN) 0.1 % nasal spray Place 2 sprays into both nostrils 2 (two) times daily. 30 mL 5   blood glucose meter kit and supplies KIT Dispense based on patient and insurance preference. Use up to four times daily as directed. (FOR ICD-9 250.00, 250.01). 1 each 0   budesonide-formoterol (SYMBICORT) 160-4.5 MCG/ACT inhaler Inhale 2  puffs into the lungs in the morning and at bedtime. 10.2 g 5   diphenoxylate-atropine (LOMOTIL) 2.5-0.025 MG tablet Take 1 tablet by mouth 4 (four) times daily as needed for diarrhea or loose stools. 30 tablet 0   enalapril (VASOTEC) 2.5 MG tablet TAKE ONE TABLET BY MOUTH ONE TIME DAILY 90 tablet 1   EPINEPHrine 0.3 MG/0.3ML SOSY Use as directed for severe allergic reactions 2 each 1   fluticasone (FLONASE) 50 MCG/ACT nasal spray Place 2 sprays into both nostrils daily. 16 g 5   Lancets 30G MISC Use as directed once daily to check blood sugar.  Diagnosis code E11.9 100 each 5   montelukast (SINGULAIR) 10 MG tablet Take 1 tablet (10 mg total) by mouth at bedtime. 90 tablet 5   Multiple Vitamins-Minerals (MULTIVITAMIN WITH MINERALS) tablet Take 1 tablet by mouth daily.     Omega-3 Fatty Acids (FISH OIL CONCENTRATE) 1000 MG CAPS Take by mouth 3 (three) times daily.     rosuvastatin (CRESTOR) 10 MG tablet Take 1 tablet (10 mg total) by mouth daily. 90 tablet 3   No facility-administered medications prior to visit.    Allergies  Allergen Reactions   Codeine Other (See Comments)    "makes me crazy"   Hornet Venom Anaphylaxis   Sulfa Antibiotics Anaphylaxis    "died 3 times".   Doxycycline Other (See Comments)    "Yeast infection"   Oxycodone    Amoxicillin Rash    Review of Systems  Constitutional:  Negative for chills, fever and malaise/fatigue.  HENT:  Negative for congestion and hearing loss.   Eyes:  Negative for discharge.  Respiratory:  Negative for cough, sputum production and shortness of breath.   Cardiovascular:  Negative for chest pain, palpitations and leg swelling.  Gastrointestinal:  Negative for abdominal pain, blood in stool, constipation, diarrhea, heartburn, nausea and vomiting.  Genitourinary:  Negative for dysuria, frequency, hematuria and urgency.  Musculoskeletal:  Negative for back pain, falls and myalgias.  Skin:  Negative for rash.  Neurological:  Negative for  dizziness, sensory change, loss of consciousness, weakness and headaches.  Endo/Heme/Allergies:  Negative for environmental allergies. Does not bruise/bleed easily.  Psychiatric/Behavioral:  Negative for depression and suicidal ideas. The patient is not nervous/anxious and does not have insomnia.        Objective:    Physical Exam Constitutional:      General: She is not in acute distress.    Appearance: She is well-developed.  HENT:     Head: Normocephalic and atraumatic.  Eyes:     Conjunctiva/sclera:  Conjunctivae normal.  Neck:     Thyroid: No thyromegaly.  Cardiovascular:     Rate and Rhythm: Normal rate and regular rhythm.     Heart sounds: Normal heart sounds. No murmur heard. Pulmonary:     Effort: Pulmonary effort is normal. No respiratory distress.     Breath sounds: Normal breath sounds.  Abdominal:     General: Bowel sounds are normal. There is no distension.     Palpations: Abdomen is soft. There is no mass.     Tenderness: There is no abdominal tenderness.  Musculoskeletal:     Cervical back: Neck supple.  Lymphadenopathy:     Cervical: No cervical adenopathy.  Skin:    General: Skin is warm and dry.  Neurological:     Mental Status: She is alert and oriented to person, place, and time.  Psychiatric:        Behavior: Behavior normal.     There were no vitals taken for this visit. Wt Readings from Last 3 Encounters:  12/04/21 181 lb 9.6 oz (82.4 kg)  11/20/21 180 lb (81.6 kg)  08/18/21 187 lb 3.2 oz (84.9 kg)    Diabetic Foot Exam - Simple   No data filed    Lab Results  Component Value Date   WBC 5.0 12/09/2021   HGB 13.2 12/09/2021   HCT 40.4 12/09/2021   PLT 168.0 12/09/2021   GLUCOSE 103 (H) 12/09/2021   CHOL 245 (H) 12/09/2021   TRIG 83.0 12/09/2021   HDL 69.40 12/09/2021   LDLDIRECT 111.2 02/06/2014   LDLCALC 159 (H) 12/09/2021   ALT 13 12/09/2021   AST 15 12/09/2021   NA 141 12/09/2021   K 4.5 12/09/2021   CL 106 12/09/2021    CREATININE 0.72 12/09/2021   BUN 16 12/09/2021   CO2 27 12/09/2021   TSH 1.08 12/09/2021   INR 2.2 09/21/2011   HGBA1C 6.8 (H) 12/09/2021   MICROALBUR 2.1 (H) 08/13/2017    Lab Results  Component Value Date   TSH 1.08 12/09/2021   Lab Results  Component Value Date   WBC 5.0 12/09/2021   HGB 13.2 12/09/2021   HCT 40.4 12/09/2021   MCV 89.3 12/09/2021   PLT 168.0 12/09/2021   Lab Results  Component Value Date   NA 141 12/09/2021   K 4.5 12/09/2021   CHLORIDE 110 (H) 10/10/2015   CO2 27 12/09/2021   GLUCOSE 103 (H) 12/09/2021   BUN 16 12/09/2021   CREATININE 0.72 12/09/2021   BILITOT 0.6 12/09/2021   ALKPHOS 67 12/09/2021   AST 15 12/09/2021   ALT 13 12/09/2021   PROT 6.3 12/09/2021   ALBUMIN 3.9 12/09/2021   CALCIUM 9.3 12/09/2021   ANIONGAP 8 10/10/2015   EGFR 86 (L) 10/10/2015   GFR 80.02 12/09/2021   Lab Results  Component Value Date   CHOL 245 (H) 12/09/2021   Lab Results  Component Value Date   HDL 69.40 12/09/2021   Lab Results  Component Value Date   LDLCALC 159 (H) 12/09/2021   Lab Results  Component Value Date   TRIG 83.0 12/09/2021   Lab Results  Component Value Date   CHOLHDL 4 12/09/2021   Lab Results  Component Value Date   HGBA1C 6.8 (H) 12/09/2021       Assessment & Plan:   Problem List Items Addressed This Visit     Diabetes mellitus type 2 in obese (HCC)    hgba1c acceptable, minimize simple carbs. Increase exercise as tolerated. Continue  current meds Opthamology Foot exam Urine micro      Hyperlipidemia    Encourage heart healthy diet such as MIND or DASH diet, increase exercise, avoid trans fats, simple carbohydrates and processed foods, consider a krill or fish or flaxseed oil cap daily. Tolerating Rosuvastatin      Preventative health care    Patient encouraged to maintain heart healthy diet, regular exercise, adequate sleep. Consider daily probiotics. Take medications as prescribed. Labs reviewed.  Colonoscopy  2019 due in 2024 [facility] January 2023, repeat next year Pap  Dexa January 2023, repeat in 2028 RSV (respiratory syncitial virus) vaccine at pharmacy Covid booster when new version at pharmacy High dose flu shot Tetanus due      Moderate persistent asthma without complication    No recent exacerbations       I am having Heydi Dennehy. Steury maintain her multivitamin with minerals, Fish Oil Concentrate, Lancets 30G, blood glucose meter kit and supplies, montelukast, Accu-Chek Aviva Plus, atovaquone-proguanil, fluticasone, azelastine, budesonide-formoterol, albuterol, EPINEPHrine, enalapril, diphenoxylate-atropine, and rosuvastatin.  No orders of the defined types were placed in this encounter.    Danise Edge, MD

## 2022-01-25 NOTE — Assessment & Plan Note (Signed)
Encourage heart healthy diet such as MIND or DASH diet, increase exercise, avoid trans fats, simple carbohydrates and processed foods, consider a krill or fish or flaxseed oil cap daily.  Tolerating Rosuvastatin 

## 2022-01-25 NOTE — Assessment & Plan Note (Signed)
Patient encouraged to maintain heart healthy diet, regular exercise, adequate sleep. Consider daily probiotics. Take medications as prescribed. Labs reviewed.  Colonoscopy 2019 due in 2024 [facility] January 2023, repeat next year Pap  Dexa January 2023, repeat in 2028 RSV (respiratory syncitial virus) vaccine at pharmacy Covid booster when new version at pharmacy High dose flu shot Tetanus due

## 2022-01-25 NOTE — Assessment & Plan Note (Addendum)
hgba1c acceptable, minimize simple carbs. Increase exercise as tolerated. Continue current meds Opthamology Foot exam Urine micro

## 2022-01-26 ENCOUNTER — Ambulatory Visit (INDEPENDENT_AMBULATORY_CARE_PROVIDER_SITE_OTHER): Payer: Medicare PPO | Admitting: Family Medicine

## 2022-01-26 VITALS — BP 138/60 | HR 75 | Temp 98.0°F | Resp 16 | Ht 66.0 in | Wt 187.4 lb

## 2022-01-26 DIAGNOSIS — E669 Obesity, unspecified: Secondary | ICD-10-CM

## 2022-01-26 DIAGNOSIS — E782 Mixed hyperlipidemia: Secondary | ICD-10-CM

## 2022-01-26 DIAGNOSIS — Z0000 Encounter for general adult medical examination without abnormal findings: Secondary | ICD-10-CM | POA: Diagnosis not present

## 2022-01-26 DIAGNOSIS — E1169 Type 2 diabetes mellitus with other specified complication: Secondary | ICD-10-CM

## 2022-01-26 DIAGNOSIS — J4540 Moderate persistent asthma, uncomplicated: Secondary | ICD-10-CM | POA: Diagnosis not present

## 2022-01-26 NOTE — Patient Instructions (Addendum)
RSV (respiratory syncitial virus) vaccine at pharmacy, Arexvy  High dose flu shot Tetanus due   Preventive Care 65 Years and Older, Female Preventive care refers to lifestyle choices and visits with your health care provider that can promote health and wellness. Preventive care visits are also called wellness exams. What can I expect for my preventive care visit? Counseling Your health care provider may ask you questions about your: Medical history, including: Past medical problems. Family medical history. Pregnancy and menstrual history. History of falls. Current health, including: Memory and ability to understand (cognition). Emotional well-being. Home life and relationship well-being. Sexual activity and sexual health. Lifestyle, including: Alcohol, nicotine or tobacco, and drug use. Access to firearms. Diet, exercise, and sleep habits. Work and work Statistician. Sunscreen use. Safety issues such as seatbelt and bike helmet use. Physical exam Your health care provider will check your: Height and weight. These may be used to calculate your BMI (body mass index). BMI is a measurement that tells if you are at a healthy weight. Waist circumference. This measures the distance around your waistline. This measurement also tells if you are at a healthy weight and may help predict your risk of certain diseases, such as type 2 diabetes and high blood pressure. Heart rate and blood pressure. Body temperature. Skin for abnormal spots. What immunizations do I need?  Vaccines are usually given at various ages, according to a schedule. Your health care provider will recommend vaccines for you based on your age, medical history, and lifestyle or other factors, such as travel or where you work. What tests do I need? Screening Your health care provider may recommend screening tests for certain conditions. This may include: Lipid and cholesterol levels. Hepatitis C test. Hepatitis B  test. HIV (human immunodeficiency virus) test. STI (sexually transmitted infection) testing, if you are at risk. Lung cancer screening. Colorectal cancer screening. Diabetes screening. This is done by checking your blood sugar (glucose) after you have not eaten for a while (fasting). Mammogram. Talk with your health care provider about how often you should have regular mammograms. BRCA-related cancer screening. This may be done if you have a family history of breast, ovarian, tubal, or peritoneal cancers. Bone density scan. This is done to screen for osteoporosis. Talk with your health care provider about your test results, treatment options, and if necessary, the need for more tests. Follow these instructions at home: Eating and drinking  Eat a diet that includes fresh fruits and vegetables, whole grains, lean protein, and low-fat dairy products. Limit your intake of foods with high amounts of sugar, saturated fats, and salt. Take vitamin and mineral supplements as recommended by your health care provider. Do not drink alcohol if your health care provider tells you not to drink. If you drink alcohol: Limit how much you have to 0-1 drink a day. Know how much alcohol is in your drink. In the [place] one drink equals one 12 oz bottle of beer (355 mL), one 5 oz glass of wine (148 mL), or one 1 oz glass of hard liquor (44 mL). Lifestyle Brush your teeth every morning and night with fluoride toothpaste. Floss one time each day. Exercise for at least 30 minutes 5 or more days each week. Do not use any products that contain nicotine or tobacco. These products include cigarettes, chewing tobacco, and vaping devices, such as e-cigarettes. If you need help quitting, ask your health care provider. Do not use drugs. If you are sexually active, practice safe sex. Use a  condom or other form of protection in order to prevent STIs. Take aspirin only as told by your health care provider. Make sure that you  understand how much to take and what form to take. Work with your health care provider to find out whether it is safe and beneficial for you to take aspirin daily. Ask your health care provider if you need to take a cholesterol-lowering medicine (statin). Find healthy ways to manage stress, such as: Meditation, yoga, or listening to music. Journaling. Talking to a trusted person. Spending time with friends and family. Minimize exposure to UV radiation to reduce your risk of skin cancer. Safety Always wear your seat belt while driving or riding in a vehicle. Do not drive: If you have been drinking alcohol. Do not ride with someone who has been drinking. When you are tired or distracted. While texting. If you have been using any mind-altering substances or drugs. Wear a helmet and other protective equipment during sports activities. If you have firearms in your house, make sure you follow all gun safety procedures. What's next? Visit your health care provider once a year for an annual wellness visit. Ask your health care provider how often you should have your eyes and teeth checked. Stay up to date on all vaccines. This information is not intended to replace advice given to you by your health care provider. Make sure you discuss any questions you have with your health care provider. Document Revised: 09/25/2020 Document Reviewed: 09/25/2020 Elsevier Patient Education  [place].

## 2022-01-26 NOTE — Assessment & Plan Note (Signed)
Encouraged DASH or MIND diet, decrease po intake and increase exercise as tolerated. Needs 7-8 hours of sleep nightly. Avoid trans fats, eat small, frequent meals every 4-5 hours with lean proteins, complex carbs and healthy fats. Minimize simple carbs, high fat foods and processed foods 

## 2022-03-16 ENCOUNTER — Encounter: Payer: Self-pay | Admitting: Family Medicine

## 2022-03-16 ENCOUNTER — Ambulatory Visit (HOSPITAL_BASED_OUTPATIENT_CLINIC_OR_DEPARTMENT_OTHER)
Admission: RE | Admit: 2022-03-16 | Discharge: 2022-03-16 | Disposition: A | Discharge status: Home / Self Care | Payer: Medicare PPO | Source: Ambulatory Visit | Attending: Family Medicine | Admitting: Family Medicine

## 2022-03-16 ENCOUNTER — Ambulatory Visit: Payer: Medicare PPO | Admitting: Family Medicine

## 2022-03-16 VITALS — BP 130/78 | HR 72 | Temp 98.0°F | Resp 18 | Ht 66.0 in | Wt 185.2 lb

## 2022-03-16 DIAGNOSIS — M25562 Pain in left knee: Secondary | ICD-10-CM

## 2022-03-16 DIAGNOSIS — R42 Dizziness and giddiness: Secondary | ICD-10-CM | POA: Insufficient documentation

## 2022-03-16 DIAGNOSIS — R55 Syncope and collapse: Secondary | ICD-10-CM | POA: Insufficient documentation

## 2022-03-16 DIAGNOSIS — M1712 Unilateral primary osteoarthritis, left knee: Secondary | ICD-10-CM | POA: Diagnosis not present

## 2022-03-16 LAB — LIPID PANEL
Cholesterol: 226 mg/dL — ABNORMAL HIGH (ref 0–200)
HDL: 74.5 mg/dL (ref >39.00)
LDL Cholesterol: 130 mg/dL — ABNORMAL HIGH (ref 0–99)
NonHDL: 151.12
Total CHOL/HDL Ratio: 3
Triglycerides: 107 mg/dL (ref 0.0–149.0)
VLDL: 21.4 mg/dL (ref 0.0–40.0)

## 2022-03-16 LAB — COMPREHENSIVE METABOLIC PANEL
ALT: 10 U/L (ref 0–35)
AST: 14 U/L (ref 0–37)
Albumin: 4 g/dL (ref 3.5–5.2)
Alkaline Phosphatase: 69 U/L (ref 39–117)
BUN: 18 mg/dL (ref 6–23)
CO2: 28 mEq/L (ref 19–32)
Calcium: 9.1 mg/dL (ref 8.4–10.5)
Chloride: 105 mEq/L (ref 96–112)
Creatinine, Ser: 0.7 mg/dL (ref 0.40–1.20)
GFR: 82.62 mL/min (ref >60.00)
Glucose, Bld: 120 mg/dL — ABNORMAL HIGH (ref 70–99)
Potassium: 4.2 mEq/L (ref 3.5–5.1)
Sodium: 140 mEq/L (ref 135–145)
Total Bilirubin: 0.7 mg/dL (ref 0.2–1.2)
Total Protein: 6.3 g/dL (ref 6.0–8.3)

## 2022-03-16 LAB — VITAMIN B12: Vitamin B-12: 591 pg/mL (ref 211–911)

## 2022-03-16 LAB — VITAMIN D 25 HYDROXY (VIT D DEFICIENCY, FRACTURES): VITD: 38.09 ng/mL (ref 30.00–100.00)

## 2022-03-16 LAB — CBC WITH DIFFERENTIAL/PLATELET
Basophils Absolute: 0.1 10*3/uL (ref 0.0–0.1)
Basophils Relative: 0.9 % (ref 0.0–3.0)
Eosinophils Absolute: 0.2 10*3/uL (ref 0.0–0.7)
Eosinophils Relative: 2.5 % (ref 0.0–5.0)
HCT: 39.9 % (ref 36.0–46.0)
Hemoglobin: 13.1 g/dL (ref 12.0–15.0)
Lymphocytes Relative: 21.9 % (ref 12.0–46.0)
Lymphs Abs: 1.8 10*3/uL (ref 0.7–4.0)
MCHC: 32.7 g/dL (ref 30.0–36.0)
MCV: 88.7 fl (ref 78.0–100.0)
Monocytes Absolute: 0.7 10*3/uL (ref 0.1–1.0)
Monocytes Relative: 9 % (ref 3.0–12.0)
Neutro Abs: 5.4 10*3/uL (ref 1.4–7.7)
Neutrophils Relative %: 65.7 % (ref 43.0–77.0)
Platelets: 206 10*3/uL (ref 150.0–400.0)
RBC: 4.5 Mil/uL (ref 3.87–5.11)
RDW: 14.7 % (ref 11.5–15.5)
WBC: 8.3 10*3/uL (ref 4.0–10.5)

## 2022-03-16 LAB — TSH: TSH: 0.77 u[IU]/mL (ref 0.35–5.50)

## 2022-03-16 NOTE — Assessment & Plan Note (Signed)
EKG no changes Check labs  F/u pcp

## 2022-03-16 NOTE — Patient Instructions (Addendum)
Syncope, Adult  Syncope refers to a condition in which a person temporarily loses consciousness. Syncope may also be called fainting or passing out. It is caused by a sudden decrease in blood flow to the brain. This can happen for a variety of reasons. Most causes of syncope are not dangerous. It can be triggered by things such as needle sticks, seeing blood, pain, or intense emotion. However, syncope can also be a sign of a serious medical problem, such as a heart abnormality. Other causes can include dehydration, migraines, or taking medicines that lower blood pressure. Your health care provider may do tests to find the reason why you are having syncope. If you faint, get medical help right away. Call your local emergency services (911 in the [place] Follow these instructions at home: Pay attention to any changes in your symptoms. Take these actions to stay safe and to help relieve your symptoms: Knowing when you may be about to faint Signs that you may be about to faint include: Feeling dizzy, weak, light-headed, or like the room is spinning. Feeling nauseous. Seeing spots or seeing all white or all black in your field of vision. Having cold, clammy skin or feeling warm and sweaty. Hearing ringing in the ears (tinnitus). If you start to feel like you might faint, sit or lie down right away. If sitting, put your head down between your legs. If lying down, raise (elevate) your feet above the level of your heart. Breathe deeply and steadily. Wait until all the symptoms have passed. Have someone stay with you until you feel stable. Medicines Take over-the-counter and prescription medicines only as told by your health care provider. If you are taking blood pressure or heart medicine, get up slowly and take several minutes to sit and then stand. This can reduce dizziness and decrease the risk of syncope. Lifestyle Do not drive, use machinery, or play sports until your health care provider says it is  okay. Do not drink alcohol. Do not use any products that contain nicotine or tobacco. These products include cigarettes, chewing tobacco, and vaping devices, such as e-cigarettes. If you need help quitting, ask your health care provider. Avoid hot tubs and saunas. General instructions Talk with your health care provider about your symptoms. You may need to have testing to understand the cause of your syncope. Drink enough fluid to keep your urine pale yellow. Avoid prolonged standing. If you must stand for a long time, do movements such as: Moving your legs. Crossing your legs. Flexing and stretching your leg muscles. Squatting. Keep all follow-up visits. This is important. Contact a health care provider if: You have episodes of near fainting. Get help right away if: You faint. You hit your head or are injured after fainting. You have any of these symptoms that may indicate trouble with your heart: Fast or irregular heartbeats (palpitations). Unusual pain in your chest, abdomen, or back. Shortness of breath. You have a seizure. You have a severe headache. You are confused. You have vision problems. You have severe weakness or trouble walking. You are bleeding from your mouth or rectum, or you have black or tarry stool. These symptoms may represent a serious problem that is an emergency. Do not wait to see if your symptoms will go away. Get medical help right away. Call your local emergency services (911 in the [place] Do not drive yourself to the hospital. Summary Syncope refers to a condition in which a person temporarily loses consciousness. Syncope may also be called   fainting or passing out. It is caused by a sudden decrease in blood flow to the brain. Signs that you may be about to faint include dizziness, feeling light-headed, feeling nauseous, sudden vision changes, or cold, clammy skin. Even though most causes of syncope are not dangerous, syncope can be a sign of a serious  medical problem. Get help right away if you faint. If you start to feel like you might faint, sit or lie down right away. If sitting, put your head down between your legs. If lying down, raise (elevate) your feet above the level of your heart. This information is not intended to replace advice given to you by your health care provider. Make sure you discuss any questions you have with your health care provider. Document Revised: 08/08/2020 Document Reviewed: 08/08/2020 Elsevier Patient Education  [place]. Acute Knee Pain, Adult Acute knee pain is sudden and may be caused by damage, swelling, or irritation of the muscles and tissues that support the knee. Pain may result from: A fall. An injury to the knee from twisting motions. A hit to the knee. Infection. Acute knee pain may go away on its own with time and rest. If it does not, your health care provider may order tests to find the cause of the pain. These may include: Imaging tests, such as an X-ray, MRI, CT scan, or ultrasound. Joint aspiration. In this test, fluid is removed from the knee and evaluated. Arthroscopy. In this test, a lighted tube is inserted into the knee and an image is projected onto a TV screen. Biopsy. In this test, a sample of tissue is removed from the body and studied under a microscope. Follow these instructions at home: If you have a knee sleeve or brace:  Wear the knee sleeve or brace as told by your health care provider. Remove it only as told by your health care provider. Loosen it if your toes tingle, become numb, or turn cold and blue. Keep it clean. If the knee sleeve or brace is not waterproof: Do not let it get wet. Cover it with a watertight covering when you take a bath or shower. Activity Rest your knee. Do not do things that cause pain or make pain worse. Avoid high-impact activities or exercises, such as running, jumping rope, or doing jumping jacks. Work with a physical therapist to  make a safe exercise program, as recommended by your health care provider. Do exercises as told by your physical therapist. Managing pain, stiffness, and swelling  If directed, put ice on the affected knee. To do this: If you have a removable knee sleeve or brace, remove it as told by your health care provider. Put ice in a plastic bag. Place a towel between your skin and the bag. Leave the ice on for 20 minutes, 2-3 times a day. Remove the ice if your skin turns bright red. This is very important. If you cannot feel pain, heat, or cold, you have a greater risk of damage to the area. If directed, use an elastic bandage to put pressure (compression) on your injured knee. This may control swelling, give support, and help with discomfort. Raise (elevate) your knee above the level of your heart while you are sitting or lying down. Sleep with a pillow under your knee. General instructions Take over-the-counter and prescription medicines only as told by your health care provider. Do not use any products that contain nicotine or tobacco, such as cigarettes, e-cigarettes, and chewing tobacco. If you need  help quitting, ask your health care provider. If you are overweight, work with your health care provider and a dietitian to set a weight-loss goal that is healthy and reasonable for you. Extra weight can put pressure on your knee. Pay attention to any changes in your symptoms. Keep all follow-up visits. This is important. Contact a health care provider if: Your knee pain continues, changes, or gets worse. You have a fever along with knee pain. Your knee feels warm to the touch or is red. Your knee buckles or locks up. Get help right away if: Your knee swells, and the swelling becomes worse. You cannot move your knee. You have severe pain in your knee that cannot be managed with pain medicine. Summary Acute knee pain can be caused by a fall, an injury, an infection, or damage, swelling, or  irritation of the tissues that support your knee. Your health care provider may perform tests to find out the cause of the pain. Pay attention to any changes in your symptoms. Relieve your pain with rest, medicines, light activity, and the use of ice. Get help right away if your knee swells, you cannot move your knee, or you have severe pain that cannot be managed with medicine. This information is not intended to replace advice given to you by your health care provider. Make sure you discuss any questions you have with your health care provider. Document Revised: 09/13/2019 Document Reviewed: 09/13/2019 Elsevier Patient Education  [place].

## 2022-03-16 NOTE — Progress Notes (Signed)
Subjective:   By signing my name below, I, Vicki Dennis, attest that this documentation has been prepared under the direction and in the presence of Donato Schultz DO 03/16/2022   Patient ID: Vicki Dennis, female    DOB: May 08, 1945, 76 y.o.   MRN: [id number]  Chief Complaint  Patient presents with   Knee Injury    Left knee, pt states fell yesterday and fell on the left side. Pt thinks she passed out.     HPI Patient is in today for an office visit  Patient presents with a left knee that causes her pain ad tightness and right knee soreness. She states that on the morning of 03/15/2022 at home, she was watching church services and was thinking to herself. She suddenly became faint and woozy and fell. She was talking to herself as she became faint. Due to the fall, she twisted her left knee, bumped her head and her right knee is currently sore. She took two Ibuprofen's the morning of the appointment. She is currently using a walker for her symptoms. She denies of any chest pain, sweatiness, or SOB at the time of the episode. Normal EKG.  Past Medical History:  Diagnosis Date   Allergy    Anemia    h/o   Asthma    Asthma, mild intermittent, well-controlled 02/10/2014   Blood transfusion without reported diagnosis    Chicken pox as a child   Diabetes mellitus    type-II, per Dr. Parke Simmers, no meds. , pt. reports weight loss has contributed  to control of diabetes.  She reports last HgbA1c- wnl.   Diabetes mellitus 11/05/2010   Diabetes mellitus type 2 in obese (HCC) 11/05/2010   Diabetes mellitus type 2, uncontrolled 11/05/2010   Esophageal reflux 02/10/2014   GERD (gastroesophageal reflux disease)    rare use of OTC aid   Hyperglycemia 09/02/2015   Hyperlipidemia    Hypertension    Measles as a child   Measles    Medicare annual wellness visit, subsequent 08/06/2014   Mumps as a child   Obesity 02/10/2014   Pneumonia    as a child   Portal vein thrombosis 03/09/2011    coumadin ended summer of 2013   Preventative health care 08/12/2014   Rash and nonspecific skin eruption 01/07/2015   Seborrheic keratosis 02/10/2014   Follows with dermatology   Sinusitis, acute 03/23/2016    Past Surgical History:  Procedure Laterality Date   BREAST BIOPSY Bilateral    INSERTION OF MESH  03/16/2012   Procedure: INSERTION OF MESH;  Surgeon: Shelly Rubenstein, MD;  Location: [facility] OR;  Service: General;  Laterality: N/A;   KNEE ARTHROSCOPY W/ MENISCAL REPAIR     both knees   TONSILLECTOMY     UMBILICAL HERNIA REPAIR  03/16/2012   Procedure: HERNIA REPAIR UMBILICAL ADULT;  Surgeon: Shelly Rubenstein, MD;  Location: [facility] OR;  Service: General;  Laterality: N/A;    Family History  Problem Relation Age of Onset   Cancer Father        prostate   Aneurysm Mother 74       brain   Cancer Maternal Aunt     Social History   Socioeconomic History   Marital status: Divorced    Spouse name: Not on file   Number of children: Not on file   Years of education: Not on file   Highest education level: Not on file  Occupational History   Not on  file  Tobacco Use   Smoking status: Never   Smokeless tobacco: Never   Tobacco comments:    never used tobacco  Vaping Use   Vaping Use: Never used  Substance and Sexual Activity   Alcohol use: Yes    Alcohol/week: 0.0 standard drinks of alcohol    Comment: for holidays   Drug use: No   Sexual activity: Not Currently    Comment: lives alone   Other Topics Concern   Not on file  Social History Narrative   Retired Runner, broadcasting/film/video   Social Determinants of Corporate investment banker Strain: Not on file  Food Insecurity: Not on file  Transportation Needs: Not on file  Physical Activity: Not on file  Stress: Not on file  Social Connections: Not on file  Intimate Partner Violence: Not on file    Outpatient Medications Prior to Visit  Medication Sig Dispense Refill   ACCU-CHEK AVIVA PLUS test strip Use to check blood sugar once  daily as directed 100 each 3   albuterol (PROVENTIL HFA) 108 (90 Base) MCG/ACT inhaler Inhale 2 puffs into the lungs every 4 (four) hours as needed for wheezing or shortness of breath. 18 g 1   atovaquone-proguanil (MALARONE) 250-100 MG TABS tablet Take 1 tablet by mouth once daily beginning 2 days prior to your trip. Continue once daily during trip and for 7 days after returning home. 50 tablet 0   azelastine (ASTELIN) 0.1 % nasal spray Place 2 sprays into both nostrils 2 (two) times daily. 30 mL 5   blood glucose meter kit and supplies KIT Dispense based on patient and insurance preference. Use up to four times daily as directed. (FOR ICD-9 250.00, 250.01). 1 each 0   budesonide-formoterol (SYMBICORT) 160-4.5 MCG/ACT inhaler Inhale 2 puffs into the lungs in the morning and at bedtime. 10.2 g 5   diphenoxylate-atropine (LOMOTIL) 2.5-0.025 MG tablet Take 1 tablet by mouth 4 (four) times daily as needed for diarrhea or loose stools. 30 tablet 0   enalapril (VASOTEC) 2.5 MG tablet TAKE ONE TABLET BY MOUTH ONE TIME DAILY 90 tablet 1   EPINEPHrine 0.3 MG/0.3ML SOSY Use as directed for severe allergic reactions 2 each 1   fluticasone (FLONASE) 50 MCG/ACT nasal spray Place 2 sprays into both nostrils daily. 16 g 5   Lancets 30G MISC Use as directed once daily to check blood sugar.  Diagnosis code E11.9 100 each 5   montelukast (SINGULAIR) 10 MG tablet Take 1 tablet (10 mg total) by mouth at bedtime. 90 tablet 5   Multiple Vitamins-Minerals (MULTIVITAMIN WITH MINERALS) tablet Take 1 tablet by mouth daily.     Omega-3 Fatty Acids (FISH OIL CONCENTRATE) 1000 MG CAPS Take by mouth 3 (three) times daily.     rosuvastatin (CRESTOR) 10 MG tablet Take 1 tablet (10 mg total) by mouth daily. 90 tablet 3   No facility-administered medications prior to visit.    Allergies  Allergen Reactions   Codeine Other (See Comments)    "makes me crazy"   Hornet Venom Anaphylaxis   Sulfa Antibiotics Anaphylaxis    "died  3 times".   Doxycycline Other (See Comments)    "Yeast infection"   Oxycodone    Amoxicillin Rash    Review of Systems  Constitutional:  Negative for chills, fever and malaise/fatigue.  HENT:  Negative for congestion and hearing loss.   Eyes:  Negative for discharge.  Respiratory:  Negative for cough, sputum production and shortness of breath.  Cardiovascular:  Negative for chest pain, palpitations and leg swelling.  Gastrointestinal:  Negative for abdominal pain, blood in stool, constipation, diarrhea, heartburn, nausea and vomiting.  Genitourinary:  Negative for dysuria, frequency, hematuria and urgency.  Musculoskeletal:  Positive for falls and joint pain (Left Knee). Negative for back pain and myalgias.       (+) Soreness (Right Knee)  Skin:  Negative for rash.  Neurological:  Negative for dizziness, sensory change, loss of consciousness, weakness and headaches.  Endo/Heme/Allergies:  Negative for environmental allergies. Does not bruise/bleed easily.  Psychiatric/Behavioral:  Negative for depression and suicidal ideas. The patient is not nervous/anxious and does not have insomnia.        Objective:    Physical Exam Vitals and nursing note reviewed.  Constitutional:      General: She is not in acute distress.    Appearance: Normal appearance. She is not ill-appearing.  HENT:     Head: Normocephalic and atraumatic.     Right Ear: External ear normal.     Left Ear: External ear normal.  Eyes:     Extraocular Movements: Extraocular movements intact.     Pupils: Pupils are equal, round, and reactive to light.  Cardiovascular:     Rate and Rhythm: Normal rate and regular rhythm.     Heart sounds: Normal heart sounds. No murmur heard.    No gallop.  Pulmonary:     Effort: Pulmonary effort is normal. No respiratory distress.     Breath sounds: Normal breath sounds. No wheezing or rales.  Musculoskeletal:        General: Swelling, tenderness and signs of injury present. No  deformity.     Right knee: Normal.     Left knee: Swelling present. Tenderness present over the medial joint line and lateral joint line.  Skin:    General: Skin is warm and dry.  Neurological:     Mental Status: She is alert and oriented to person, place, and time.  Psychiatric:        Judgment: Judgment normal.     BP 130/78 (BP Location: Left Arm, Patient Position: Sitting, Cuff Size: Normal)   Pulse 72   Temp 98 F (36.7 C) (Oral)   Resp 18   Ht 5\' 6"  (1.676 m)   Wt 185 lb 3.2 oz (84 kg)   SpO2 98%   BMI 29.89 kg/m  Wt Readings from Last 3 Encounters:  03/16/22 185 lb 3.2 oz (84 kg)  01/26/22 187 lb 6.4 oz (85 kg)  12/04/21 181 lb 9.6 oz (82.4 kg)    Diabetic Foot Exam - Simple   No data filed    Lab Results  Component Value Date   WBC 8.3 03/16/2022   HGB 13.1 03/16/2022   HCT 39.9 03/16/2022   PLT 206.0 03/16/2022   GLUCOSE 120 (H) 03/16/2022   CHOL 226 (H) 03/16/2022   TRIG 107.0 03/16/2022   HDL 74.50 03/16/2022   LDLDIRECT 111.2 02/06/2014   LDLCALC 130 (H) 03/16/2022   ALT 10 03/16/2022   AST 14 03/16/2022   NA 140 03/16/2022   K 4.2 03/16/2022   CL 105 03/16/2022   CREATININE 0.70 03/16/2022   BUN 18 03/16/2022   CO2 28 03/16/2022   TSH 0.77 03/16/2022   INR 2.2 09/21/2011   HGBA1C 6.8 (H) 12/09/2021   MICROALBUR 2.1 (H) 08/13/2017    Lab Results  Component Value Date   TSH 0.77 03/16/2022   Lab Results  Component Value Date  WBC 8.3 03/16/2022   HGB 13.1 03/16/2022   HCT 39.9 03/16/2022   MCV 88.7 03/16/2022   PLT 206.0 03/16/2022   Lab Results  Component Value Date   NA 140 03/16/2022   K 4.2 03/16/2022   CHLORIDE 110 (H) 10/10/2015   CO2 28 03/16/2022   GLUCOSE 120 (H) 03/16/2022   BUN 18 03/16/2022   CREATININE 0.70 03/16/2022   BILITOT 0.7 03/16/2022   ALKPHOS 69 03/16/2022   AST 14 03/16/2022   ALT 10 03/16/2022   PROT 6.3 03/16/2022   ALBUMIN 4.0 03/16/2022   CALCIUM 9.1 03/16/2022   ANIONGAP 8 10/10/2015   EGFR  86 (L) 10/10/2015   GFR 82.62 03/16/2022   Lab Results  Component Value Date   CHOL 226 (H) 03/16/2022   Lab Results  Component Value Date   HDL 74.50 03/16/2022   Lab Results  Component Value Date   LDLCALC 130 (H) 03/16/2022   Lab Results  Component Value Date   TRIG 107.0 03/16/2022   Lab Results  Component Value Date   CHOLHDL 3 03/16/2022   Lab Results  Component Value Date   HGBA1C 6.8 (H) 12/09/2021  EKG-- no change from previous       Assessment & Plan:   Problem List Items Addressed This Visit       Unprioritized   Near syncope - Primary   Relevant Orders   EKG 12-Lead (Completed)   CBC with Differential/Platelet (Completed)   Comprehensive metabolic panel (Completed)   Lipid panel (Completed)   TSH (Completed)   Vitamin B12 (Completed)   VITAMIN D 25 Hydroxy (Vit-D Deficiency, Fractures) (Completed)   US Carotid Bilateral   Dizziness    EKG no changes Check labs  F/u pcp       Relevant Orders   CBC with Differential/Platelet (Completed)   Comprehensive metabolic panel (Completed)   Lipid panel (Completed)   TSH (Completed)   Vitamin B12 (Completed)   VITAMIN D 25 Hydroxy (Vit-D Deficiency, Fractures) (Completed)   US Carotid Bilateral   Other Visit Diagnoses     Acute pain of left knee       Relevant Orders   DG Knee Complete 4 Views Left (Completed)      No orders of the defined types were placed in this encounter.   IDonato Schultz, DO, personally preformed the services described in this documentation.  All medical record entries made by the scribe were at my direction and in my presence.  I have reviewed the chart and discharge instructions (if applicable) and agree that the record reflects my personal performance and is accurate and complete. 03/16/2022   I,Amber Collins,acting as a scribe for Donato Schultz, DO.,have documented all relevant documentation on the behalf of Donato Schultz, DO,as directed by   Donato Schultz, DO while in the presence of Donato Schultz, DO.    Donato Schultz, DO

## 2022-03-17 ENCOUNTER — Ambulatory Visit (HOSPITAL_BASED_OUTPATIENT_CLINIC_OR_DEPARTMENT_OTHER)
Admission: RE | Admit: 2022-03-17 | Discharge: 2022-03-17 | Disposition: A | Discharge status: Home / Self Care | Payer: Medicare PPO | Source: Ambulatory Visit | Attending: Family Medicine | Admitting: Family Medicine

## 2022-03-17 DIAGNOSIS — R55 Syncope and collapse: Secondary | ICD-10-CM | POA: Diagnosis not present

## 2022-03-17 DIAGNOSIS — R42 Dizziness and giddiness: Secondary | ICD-10-CM | POA: Insufficient documentation

## 2022-03-17 DIAGNOSIS — I6523 Occlusion and stenosis of bilateral carotid arteries: Secondary | ICD-10-CM | POA: Diagnosis not present

## 2022-03-18 ENCOUNTER — Other Ambulatory Visit: Payer: Self-pay

## 2022-03-18 ENCOUNTER — Other Ambulatory Visit (HOSPITAL_BASED_OUTPATIENT_CLINIC_OR_DEPARTMENT_OTHER): Payer: Self-pay

## 2022-03-18 DIAGNOSIS — M25562 Pain in left knee: Secondary | ICD-10-CM

## 2022-03-18 MED ORDER — MELOXICAM 7.5 MG PO TABS
7.5000 mg | ORAL_TABLET | Freq: Every day | ORAL | 3 refills | Status: AC | PRN
  Filled 2022-03-18: qty 60, 30d supply, fill #0
  Filled 2022-06-15: qty 60, 30d supply, fill #1
  Filled 2022-08-31: qty 60, 30d supply, fill #2
  Filled 2022-11-04: qty 60, 30d supply, fill #3

## 2022-03-20 ENCOUNTER — Ambulatory Visit (INDEPENDENT_AMBULATORY_CARE_PROVIDER_SITE_OTHER): Payer: Medicare PPO | Admitting: Surgery

## 2022-03-20 ENCOUNTER — Other Ambulatory Visit (HOSPITAL_BASED_OUTPATIENT_CLINIC_OR_DEPARTMENT_OTHER): Payer: Self-pay

## 2022-03-20 DIAGNOSIS — M1712 Unilateral primary osteoarthritis, left knee: Secondary | ICD-10-CM | POA: Diagnosis not present

## 2022-03-20 MED ORDER — BUPIVACAINE HCL 0.25 % IJ SOLN
6.0000 mL | INTRAMUSCULAR | Status: AC | PRN
  Administered 2022-03-20: 6 mL via INTRA_ARTICULAR

## 2022-03-20 MED ORDER — METHYLPREDNISOLONE ACETATE 40 MG/ML IJ SUSP
40.0000 mg | INTRAMUSCULAR | Status: AC | PRN
  Administered 2022-03-20: 40 mg via INTRA_ARTICULAR

## 2022-03-20 MED ORDER — LIDOCAINE HCL 1 % IJ SOLN
3.0000 mL | INTRAMUSCULAR | Status: AC | PRN
  Administered 2022-03-20: 3 mL

## 2022-03-20 NOTE — Progress Notes (Signed)
Office Visit Note   Patient: Vicki Dennis           Date of Birth: 1945-12-21           MRN: [id number] Visit Date: 03/20/2022              Requested by: [address], [place], [place] [address] [place],  [place] [address] PCP: Bradd Canary, MD   Assessment & Plan: Visit Diagnoses:  1. Arthritis of left knee     Plan: In hopes of giving patient relief of her left knee pain offered injection.  After patient consent left knee was prepped with Betadine and intra-articular Marcaine/Depo-Medrol injection was performed.  Follow-up in the office in 4 weeks with Dr. August Saucer for recheck.  Advised patient that with the degenerative changes that are seen on her x-ray that ultimately will come down to her needing definitive treatment with total knee replacement.  She would like to get exhaust all conservative measures before going that route.  When she returns in a few weeks if she continues have ongoing symptoms advised that Dr. August Saucer may consider trying viscosupplementation.  York Spaniel that she may be interested in having a total knee replacement next summer.  Follow-Up Instructions: Return in about 4 weeks (around 04/17/2022) for with dr dean recheck left knee.  possible visco injections .   Orders:  No orders of the defined types were placed in this encounter.  No orders of the defined types were placed in this encounter.     Procedures: Large Joint Inj: L knee on 03/20/2022 3:58 PM Indications: pain Details: 25 G 1.5 in needle, anteromedial approach Medications: 3 mL lidocaine 1 %; 6 mL bupivacaine 0.25 %; 40 mg methylPREDNISolone acetate 40 MG/ML Outcome: tolerated well, no immediate complications Consent was given by the patient.       Clinical Data: No additional findings.   Subjective: Chief Complaint  Patient presents with   Left Knee - Pain    HPI 76 year old black female with known history of left knee DJD comes in with complaints of knee pain.  Patient states  that she has had knee arthroscopy by Dr. Terrilee Croak several years ago.  That physician is currently retired.  Left knee pain flared up when she had a fall a few days ago.  Currently using a cane. Review of Systems No current complaints of cardiopulmonary GI/GU issues  Objective: Vital Signs: There were no vitals taken for this visit.  Physical Exam HENT:     Head: Normocephalic and atraumatic.  Eyes:     Extraocular Movements: Extraocular movements intact.  Pulmonary:     Effort: No respiratory distress.  Musculoskeletal:     Comments: Gait is antalgic.  Left knee range of motion about 0 to 115 degrees.  Medial lateral joint line tenderness.  Positive patellofemoral crepitus.  Neurological:     Mental Status: She is alert.  Psychiatric:        Mood and Affect: Mood normal.     Ortho Exam  Specialty Comments:  No specialty comments available.  Imaging: No results found.   PMFS History: Patient Active Problem List   Diagnosis Date Noted   Dizziness 03/16/2022   Near syncope 03/16/2022   Diarrhea 11/20/2021   Seasonal and perennial allergic rhinoconjunctivitis 10/17/2020   Dermatitis 05/29/2020   Hiatal hernia 01/31/2018   Hemangioma of liver 01/31/2018   Renal cyst 01/31/2018   Seasonal and perennial allergic rhinitis 06/17/2017   Moderate  persistent asthma without complication 06/17/2017   Abdominal mass 04/09/2017   Preventative health care 08/06/2014   Obesity 02/10/2014   History of colonic polyps 02/10/2014   Gastroesophageal reflux disease 02/10/2014   Seborrheic keratosis 02/10/2014   Asthma, mild intermittent, well-controlled 02/10/2014   History of chicken pox    Mumps    Allergy    Umbilical hernia 02/29/2012   Portal vein thrombosis 03/09/2011   Diabetes mellitus type 2 in obese (HCC) 11/05/2010   Hyperlipidemia 11/05/2010   Past Medical History:  Diagnosis Date   Allergy    Anemia    h/o   Asthma    Asthma, mild intermittent, well-controlled  02/10/2014   Blood transfusion without reported diagnosis    Chicken pox as a child   Diabetes mellitus    type-II, per Dr. Parke Simmers, no meds. , pt. reports weight loss has contributed  to control of diabetes.  She reports last HgbA1c- wnl.   Diabetes mellitus 11/05/2010   Diabetes mellitus type 2 in obese (HCC) 11/05/2010   Diabetes mellitus type 2, uncontrolled 11/05/2010   Esophageal reflux 02/10/2014   GERD (gastroesophageal reflux disease)    rare use of OTC aid   Hyperglycemia 09/02/2015   Hyperlipidemia    Hypertension    Measles as a child   Measles    Medicare annual wellness visit, subsequent 08/06/2014   Mumps as a child   Obesity 02/10/2014   Pneumonia    as a child   Portal vein thrombosis 03/09/2011   coumadin ended summer of 2013   Preventative health care 08/12/2014   Rash and nonspecific skin eruption 01/07/2015   Seborrheic keratosis 02/10/2014   Follows with dermatology   Sinusitis, acute 03/23/2016    Family History  Problem Relation Age of Onset   Cancer Father        prostate   Aneurysm Mother 106       brain   Cancer Maternal Aunt     Past Surgical History:  Procedure Laterality Date   BREAST BIOPSY Bilateral    INSERTION OF MESH  03/16/2012   Procedure: INSERTION OF MESH;  Surgeon: Shelly Rubenstein, MD;  Location: [facility] OR;  Service: General;  Laterality: N/A;   KNEE ARTHROSCOPY W/ MENISCAL REPAIR     both knees   TONSILLECTOMY     UMBILICAL HERNIA REPAIR  03/16/2012   Procedure: HERNIA REPAIR UMBILICAL ADULT;  Surgeon: Shelly Rubenstein, MD;  Location: [facility] OR;  Service: General;  Laterality: N/A;   Social History   Occupational History   Not on file  Tobacco Use   Smoking status: Never   Smokeless tobacco: Never   Tobacco comments:    never used tobacco  Vaping Use   Vaping Use: Never used  Substance and Sexual Activity   Alcohol use: Yes    Alcohol/week: 0.0 standard drinks of alcohol    Comment: for holidays   Drug use: No   Sexual  activity: Not Currently    Comment: lives alone

## 2022-03-23 ENCOUNTER — Encounter: Payer: Self-pay | Admitting: *Deleted

## 2022-03-23 ENCOUNTER — Telehealth: Payer: Self-pay | Admitting: Family Medicine

## 2022-03-23 NOTE — Telephone Encounter (Signed)
Pt is needing a dr's note to excuse her from work 12/4 and 12/5 (imaging appt) when she was seen by Dr.Lowne. She will be here around 2.

## 2022-03-24 ENCOUNTER — Ambulatory Visit: Payer: Medicare PPO | Admitting: Surgery

## 2022-03-24 NOTE — Telephone Encounter (Signed)
Note given yesterday

## 2022-04-20 ENCOUNTER — Encounter: Payer: Self-pay | Admitting: Orthopedic Surgery

## 2022-04-20 ENCOUNTER — Telehealth: Payer: Self-pay

## 2022-04-20 ENCOUNTER — Ambulatory Visit: Payer: Medicare PPO | Admitting: Orthopedic Surgery

## 2022-04-20 DIAGNOSIS — M1712 Unilateral primary osteoarthritis, left knee: Secondary | ICD-10-CM | POA: Diagnosis not present

## 2022-04-20 NOTE — Telephone Encounter (Signed)
Auth needed for bilat knee gel injections  

## 2022-04-20 NOTE — Progress Notes (Signed)
Office Visit Note   Patient: Vicki Dennis           Date of Birth: [birth year]           MRN: [id number] Visit Date: 04/20/2022 Requested by: Bradd Canary, MD [address] [place],  [place] [address] PCP: Bradd Canary, MD  Subjective: Chief Complaint  Patient presents with   Left Knee - Follow-up    HPI: Vicki Dennis is a 77 y.o. female who presents to the office reporting left knee pain.  She had an injection 03/20/2022 which has helped her significantly.  She does report a gradual return of some symptoms.  Pain does not wake her from sleep.  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  She does have a remote history of bilateral arthroscopy and meniscal surgery.  A fall on the left knee precipitated a general decline in her left knee function..                ROS: All systems reviewed are negative as they relate to the chief complaint within the history of present illness.  Patient denies fevers or chills.  Assessment & Plan: Visit Diagnoses:  1. Arthritis of left knee   Impression is severe end-stage left knee arthritis radiographically with  Clinical symptoms which are not as severe.  Plan is preapproved for Visco injection in the left knee in 6 weeks.  I think she is heading for knee replacement sometime in the future but she will know when it is the best time based on her clinical symptoms.  Some of the rehabilitation and rationale behind total knee replacement is discussed using models and drawings.   Plan: This patient is diagnosed with osteoarthritis of the knee(s).    Radiographs show evidence of joint space narrowing, osteophytes, subchondral sclerosis and/or subchondral cysts.  This patient has knee pain which interferes with functional and activities of daily living.    This patient has experienced inadequate response, adverse effects and/or intolerance with conservative treatments such as  acetaminophen, NSAIDS, topical creams, physical therapy or regular exercise, knee bracing and/or weight loss.   This patient has experienced inadequate response or has a contraindication to intra articular steroid injections for at least 3 months.   This patient is not scheduled to have a total knee replacement within 6 months of starting treatment with viscosupplementation.   Follow-Up Instructions: No follow-ups on file.   Orders:  No orders of the defined types were placed in this encounter.  No orders of the defined types were placed in this encounter.     Procedures: No procedures performed   Clinical Data: No additional findings.  Objective: Vital Signs: There were no vitals taken for this visit.  Physical Exam:  Constitutional: Patient appears well-developed HEENT:  Head: Normocephalic Eyes:EOM are normal Neck: Normal range of motion Cardiovascular: Normal rate Pulmonary/chest: Effort normal Neurologic: Patient is alert Skin: Skin is warm Psychiatric: Patient has normal mood and affect  Ortho Exam: Ortho exam demonstrates range of motion on the left of 10 to 90 degrees on the right 5 to 100 degrees.  No effusion in either knee.  Pedal pulses palpable.  No groin pain on the right or left-hand side with internal/external rotation of the leg.  Extensor mechanism nontender and intact on the left.  Specialty Comments:  No specialty comments available.  Imaging: No results found.   PMFS History: Patient  Active Problem List   Diagnosis Date Noted   Dizziness 03/16/2022   Near syncope 03/16/2022   Diarrhea 11/20/2021   Seasonal and perennial allergic rhinoconjunctivitis 10/17/2020   Dermatitis 05/29/2020   Hiatal hernia 01/31/2018   Hemangioma of liver 01/31/2018   Renal cyst 01/31/2018   Seasonal and perennial allergic rhinitis 06/17/2017   Moderate persistent asthma without complication 06/17/2017   Abdominal mass 04/09/2017   Preventative health care  08/06/2014   Obesity 02/10/2014   History of colonic polyps 02/10/2014   Gastroesophageal reflux disease 02/10/2014   Seborrheic keratosis 02/10/2014   Asthma, mild intermittent, well-controlled 02/10/2014   History of chicken pox    Mumps    Allergy    Umbilical hernia 02/29/2012   Portal vein thrombosis 03/09/2011   Diabetes mellitus type 2 in obese (HCC) 11/05/2010   Hyperlipidemia 11/05/2010   Past Medical History:  Diagnosis Date   Allergy    Anemia    h/o   Asthma    Asthma, mild intermittent, well-controlled 02/10/2014   Blood transfusion without reported diagnosis    Chicken pox as a child   Diabetes mellitus    type-II, per Dr. Parke Simmers, no meds. , pt. reports weight loss has contributed  to control of diabetes.  She reports last HgbA1c- wnl.   Diabetes mellitus 11/05/2010   Diabetes mellitus type 2 in obese (HCC) 11/05/2010   Diabetes mellitus type 2, uncontrolled 11/05/2010   Esophageal reflux 02/10/2014   GERD (gastroesophageal reflux disease)    rare use of OTC aid   Hyperglycemia 09/02/2015   Hyperlipidemia    Hypertension    Measles as a child   Measles    Medicare annual wellness visit, subsequent 08/06/2014   Mumps as a child   Obesity 02/10/2014   Pneumonia    as a child   Portal vein thrombosis 03/09/2011   coumadin ended summer of 2013   Preventative health care 08/12/2014   Rash and nonspecific skin eruption 01/07/2015   Seborrheic keratosis 02/10/2014   Follows with dermatology   Sinusitis, acute 03/23/2016    Family History  Problem Relation Age of Onset   Cancer Father        prostate   Aneurysm Mother 41       brain   Cancer Maternal Aunt     Past Surgical History:  Procedure Laterality Date   BREAST BIOPSY Bilateral    INSERTION OF MESH  03/16/2012   Procedure: INSERTION OF MESH;  Surgeon: Shelly Rubenstein, MD;  Location: [facility] OR;  Service: General;  Laterality: N/A;   KNEE ARTHROSCOPY W/ MENISCAL REPAIR     both knees   TONSILLECTOMY      UMBILICAL HERNIA REPAIR  03/16/2012   Procedure: HERNIA REPAIR UMBILICAL ADULT;  Surgeon: Shelly Rubenstein, MD;  Location: [facility] OR;  Service: General;  Laterality: N/A;   Social History   Occupational History   Not on file  Tobacco Use   Smoking status: Never   Smokeless tobacco: Never   Tobacco comments:    never used tobacco  Vaping Use   Vaping Use: Never used  Substance and Sexual Activity   Alcohol use: Yes    Alcohol/week: 0.0 standard drinks of alcohol    Comment: for holidays   Drug use: No   Sexual activity: Not Currently    Comment: lives alone

## 2022-04-24 NOTE — Telephone Encounter (Signed)
VOB submitted for Monovisc, bilateral knee.  

## 2022-05-14 ENCOUNTER — Telehealth: Payer: Self-pay

## 2022-05-14 DIAGNOSIS — M1711 Unilateral primary osteoarthritis, right knee: Secondary | ICD-10-CM

## 2022-05-14 DIAGNOSIS — M1712 Unilateral primary osteoarthritis, left knee: Secondary | ICD-10-CM

## 2022-05-14 NOTE — Telephone Encounter (Signed)
Called and left a VM for patient to CB to schedule with Dr. Marlou Sa or Lurena Joiner for gel injection.   See referral tab

## 2022-05-25 ENCOUNTER — Other Ambulatory Visit: Payer: Medicare PPO

## 2022-05-26 ENCOUNTER — Other Ambulatory Visit (INDEPENDENT_AMBULATORY_CARE_PROVIDER_SITE_OTHER): Payer: Medicare PPO

## 2022-05-26 DIAGNOSIS — J4540 Moderate persistent asthma, uncomplicated: Secondary | ICD-10-CM | POA: Diagnosis not present

## 2022-05-26 DIAGNOSIS — E782 Mixed hyperlipidemia: Secondary | ICD-10-CM

## 2022-05-26 DIAGNOSIS — E1169 Type 2 diabetes mellitus with other specified complication: Secondary | ICD-10-CM | POA: Diagnosis not present

## 2022-05-26 DIAGNOSIS — E669 Obesity, unspecified: Secondary | ICD-10-CM | POA: Diagnosis not present

## 2022-05-26 LAB — CBC WITH DIFFERENTIAL/PLATELET
Basophils Absolute: 0.1 10*3/uL (ref 0.0–0.1)
Basophils Relative: 1.2 % (ref 0.0–3.0)
Eosinophils Absolute: 0.4 10*3/uL (ref 0.0–0.7)
Eosinophils Relative: 4.3 % (ref 0.0–5.0)
HCT: 39.4 % (ref 36.0–46.0)
Hemoglobin: 12.9 g/dL (ref 12.0–15.0)
Lymphocytes Relative: 19.4 % (ref 12.0–46.0)
Lymphs Abs: 1.7 10*3/uL (ref 0.7–4.0)
MCHC: 32.8 g/dL (ref 30.0–36.0)
MCV: 89.2 fl (ref 78.0–100.0)
Monocytes Absolute: 0.7 10*3/uL (ref 0.1–1.0)
Monocytes Relative: 8 % (ref 3.0–12.0)
Neutro Abs: 5.8 10*3/uL (ref 1.4–7.7)
Neutrophils Relative %: 67.1 % (ref 43.0–77.0)
Platelets: 199 10*3/uL (ref 150.0–400.0)
RBC: 4.42 Mil/uL (ref 3.87–5.11)
RDW: 14.5 % (ref 11.5–15.5)
WBC: 8.6 10*3/uL (ref 4.0–10.5)

## 2022-05-26 LAB — COMPREHENSIVE METABOLIC PANEL
ALT: 11 U/L (ref 0–35)
AST: 13 U/L (ref 0–37)
Albumin: 3.8 g/dL (ref 3.5–5.2)
Alkaline Phosphatase: 68 U/L (ref 39–117)
BUN: 14 mg/dL (ref 6–23)
CO2: 28 mEq/L (ref 19–32)
Calcium: 9.2 mg/dL (ref 8.4–10.5)
Chloride: 103 mEq/L (ref 96–112)
Creatinine, Ser: 0.69 mg/dL (ref 0.40–1.20)
GFR: 82.79 mL/min (ref >60.00)
Glucose, Bld: 106 mg/dL — ABNORMAL HIGH (ref 70–99)
Potassium: 4.2 mEq/L (ref 3.5–5.1)
Sodium: 137 mEq/L (ref 135–145)
Total Bilirubin: 0.6 mg/dL (ref 0.2–1.2)
Total Protein: 5.8 g/dL — ABNORMAL LOW (ref 6.0–8.3)

## 2022-05-26 LAB — TSH: TSH: 1.11 u[IU]/mL (ref 0.35–5.50)

## 2022-05-26 LAB — LIPID PANEL
Cholesterol: 173 mg/dL (ref 0–200)
HDL: 68.1 mg/dL (ref >39.00)
LDL Cholesterol: 88 mg/dL (ref 0–99)
NonHDL: 104.41
Total CHOL/HDL Ratio: 3
Triglycerides: 81 mg/dL (ref 0.0–149.0)
VLDL: 16.2 mg/dL (ref 0.0–40.0)

## 2022-05-26 LAB — HEMOGLOBIN A1C: Hgb A1c MFr Bld: 7.1 % — ABNORMAL HIGH (ref 4.6–6.5)

## 2022-05-26 NOTE — Addendum Note (Signed)
Addended by: Manuela Schwartz on: 05/26/2022 08:15 AM   Modules accepted: Orders

## 2022-05-28 ENCOUNTER — Other Ambulatory Visit (INDEPENDENT_AMBULATORY_CARE_PROVIDER_SITE_OTHER): Payer: Medicare PPO

## 2022-05-28 DIAGNOSIS — E669 Obesity, unspecified: Secondary | ICD-10-CM

## 2022-05-28 DIAGNOSIS — E782 Mixed hyperlipidemia: Secondary | ICD-10-CM

## 2022-05-28 DIAGNOSIS — E1169 Type 2 diabetes mellitus with other specified complication: Secondary | ICD-10-CM

## 2022-05-28 LAB — MICROALBUMIN / CREATININE URINE RATIO
Creatinine,U: 107 mg/dL
Microalb Creat Ratio: 1.2 mg/g (ref 0.0–30.0)
Microalb, Ur: 1.3 mg/dL (ref 0.0–1.9)

## 2022-05-28 NOTE — Progress Notes (Signed)
No charge today. Pt unable to urinate at visit yesterday and returned specimen today.

## 2022-05-29 ENCOUNTER — Other Ambulatory Visit (HOSPITAL_BASED_OUTPATIENT_CLINIC_OR_DEPARTMENT_OTHER): Payer: Self-pay

## 2022-05-31 NOTE — Assessment & Plan Note (Signed)
Encouraged DASH or MIND diet, decrease po intake and increase exercise as tolerated. Needs 7-8 hours of sleep nightly. Avoid trans fats, eat small, frequent meals every 4-5 hours with lean proteins, complex carbs and healthy fats. Minimize simple carbs, high fat foods and processed foods 

## 2022-05-31 NOTE — Assessment & Plan Note (Signed)
Encourage heart healthy diet such as MIND or DASH diet, increase exercise, avoid trans fats, simple carbohydrates and processed foods, consider a krill or fish or flaxseed oil cap daily. Tolerating Rosuvastatin

## 2022-05-31 NOTE — Assessment & Plan Note (Signed)
hgba1c acceptable, minimize simple carbs. Increase exercise as tolerated. Continue current meds 

## 2022-05-31 NOTE — Assessment & Plan Note (Signed)
No recent exacerbations, uses Symbicort daily and it helps but she does note after she takes the dose she can vomit sometimes. Encouraged to try Albuterol prior to using Symbicort and let us know if it continues.

## 2022-06-01 ENCOUNTER — Ambulatory Visit: Payer: Medicare PPO | Admitting: Family Medicine

## 2022-06-01 VITALS — BP 126/74 | HR 74 | Temp 98.0°F | Resp 16 | Ht 66.0 in | Wt 185.0 lb

## 2022-06-01 DIAGNOSIS — M25562 Pain in left knee: Secondary | ICD-10-CM

## 2022-06-01 DIAGNOSIS — E1169 Type 2 diabetes mellitus with other specified complication: Secondary | ICD-10-CM

## 2022-06-01 DIAGNOSIS — J4520 Mild intermittent asthma, uncomplicated: Secondary | ICD-10-CM

## 2022-06-01 DIAGNOSIS — E669 Obesity, unspecified: Secondary | ICD-10-CM | POA: Diagnosis not present

## 2022-06-01 DIAGNOSIS — T7840XA Allergy, unspecified, initial encounter: Secondary | ICD-10-CM | POA: Diagnosis not present

## 2022-06-01 DIAGNOSIS — G8929 Other chronic pain: Secondary | ICD-10-CM | POA: Diagnosis not present

## 2022-06-01 DIAGNOSIS — E782 Mixed hyperlipidemia: Secondary | ICD-10-CM

## 2022-06-01 NOTE — Progress Notes (Signed)
Subjective:    Patient ID: Vicki Dennis, female    DOB: 1945/10/01, 78 y.o.   MRN: [id number]  Chief Complaint  Patient presents with   Follow-up    Follow Up     HPI Patient is in today for follow up on chronic medical concerns. No recent febrile illness or hospitalizations. Denies CP/palp/SOB/Hell but is noting ongoing knee pain, A/congestion/fevers/GI or GU c/o. Taking meds as prescribed. She is feeling well otherwise. She is eating well and gets her rest. She stays busy and continues to teach.    Past Medical History:  Diagnosis Date   Allergy    Anemia    h/o   Asthma    Asthma, mild intermittent, well-controlled 02/10/2014   Blood transfusion without reported diagnosis    Chicken pox as a child   Diabetes mellitus    type-II, per Dr. Parke Simmers, no meds. , pt. reports weight loss has contributed  to control of diabetes.  She reports last HgbA1c- wnl.   Diabetes mellitus 11/05/2010   Diabetes mellitus type 2 in obese (HCC) 11/05/2010   Diabetes mellitus type 2, uncontrolled 11/05/2010   Esophageal reflux 02/10/2014   GERD (gastroesophageal reflux disease)    rare use of OTC aid   Hyperglycemia 09/02/2015   Hyperlipidemia    Hypertension    Measles as a child   Measles    Medicare annual wellness visit, subsequent 08/06/2014   Mumps as a child   Obesity 02/10/2014   Pneumonia    as a child   Portal vein thrombosis 03/09/2011   coumadin ended summer of 2013   Preventative health care 08/12/2014   Rash and nonspecific skin eruption 01/07/2015   Seborrheic keratosis 02/10/2014   Follows with dermatology   Sinusitis, acute 03/23/2016    Past Surgical History:  Procedure Laterality Date   BREAST BIOPSY Bilateral    INSERTION OF MESH  03/16/2012   Procedure: INSERTION OF MESH;  Surgeon: Shelly Rubenstein, MD;  Location: [facility] OR;  Service: General;  Laterality: N/A;   KNEE ARTHROSCOPY W/ MENISCAL REPAIR     both knees   TONSILLECTOMY     UMBILICAL HERNIA REPAIR   03/16/2012   Procedure: HERNIA REPAIR UMBILICAL ADULT;  Surgeon: Shelly Rubenstein, MD;  Location: [facility] OR;  Service: General;  Laterality: N/A;    Family History  Problem Relation Age of Onset   Cancer Father        prostate   Aneurysm Mother 1       brain   Cancer Maternal Aunt     Social History   Socioeconomic History   Marital status: Divorced    Spouse name: Not on file   Number of children: Not on file   Years of education: Not on file   Highest education level: Not on file  Occupational History   Not on file  Tobacco Use   Smoking status: Never   Smokeless tobacco: Never   Tobacco comments:    never used tobacco  Vaping Use   Vaping Use: Never used  Substance and Sexual Activity   Alcohol use: Yes    Alcohol/week: 0.0 standard drinks of alcohol    Comment: for holidays   Drug use: No   Sexual activity: Not Currently    Comment: lives alone   Other Topics Concern   Not on file  Social History Narrative   Retired Runner, broadcasting/film/video   Social Determinants of Corporate investment banker Strain: Not on file  Food Insecurity: Not on file  Transportation Needs: Not on file  Physical Activity: Not on file  Stress: Not on file  Social Connections: Not on file  Intimate Partner Violence: Not on file    Outpatient Medications Prior to Visit  Medication Sig Dispense Refill   ACCU-CHEK AVIVA PLUS test strip Use to check blood sugar once daily as directed 100 each 3   albuterol (PROVENTIL HFA) 108 (90 Base) MCG/ACT inhaler Inhale 2 puffs into the lungs every 4 (four) hours as needed for wheezing or shortness of breath. 18 g 1   azelastine (ASTELIN) 0.1 % nasal spray Place 2 sprays into both nostrils 2 (two) times daily. 30 mL 5   blood glucose meter kit and supplies KIT Dispense based on patient and insurance preference. Use up to four times daily as directed. (FOR ICD-9 250.00, 250.01). 1 each 0   budesonide-formoterol (SYMBICORT) 160-4.5 MCG/ACT inhaler Inhale 2 puffs into  the lungs in the morning and at bedtime. 10.2 g 5   diphenoxylate-atropine (LOMOTIL) 2.5-0.025 MG tablet Take 1 tablet by mouth 4 (four) times daily as needed for diarrhea or loose stools. 30 tablet 0   enalapril (VASOTEC) 2.5 MG tablet TAKE ONE TABLET BY MOUTH ONE TIME DAILY 90 tablet 1   EPINEPHrine 0.3 MG/0.3ML SOSY Use as directed for severe allergic reactions 2 each 1   fluticasone (FLONASE) 50 MCG/ACT nasal spray Place 2 sprays into both nostrils daily. 16 g 5   Lancets 30G MISC Use as directed once daily to check blood sugar.  Diagnosis code E11.9 100 each 5   meloxicam (MOBIC) 7.5 MG tablet Take 1-2 tablets (7.5-15 mg total) by mouth daily as needed for pain. 60 tablet 3   montelukast (SINGULAIR) 10 MG tablet Take 1 tablet (10 mg total) by mouth at bedtime. 90 tablet 5   Multiple Vitamins-Minerals (MULTIVITAMIN WITH MINERALS) tablet Take 1 tablet by mouth daily.     Omega-3 Fatty Acids (FISH OIL CONCENTRATE) 1000 MG CAPS Take by mouth 3 (three) times daily.     rosuvastatin (CRESTOR) 10 MG tablet Take 1 tablet (10 mg total) by mouth daily. 90 tablet 3   atovaquone-proguanil (MALARONE) 250-100 MG TABS tablet Take 1 tablet by mouth once daily beginning 2 days prior to your trip. Continue once daily during trip and for 7 days after returning home. 50 tablet 0   No facility-administered medications prior to visit.    Allergies  Allergen Reactions   Codeine Other (See Comments)    "makes me crazy"   Hornet Venom Anaphylaxis   Sulfa Antibiotics Anaphylaxis    "died 3 times".   Doxycycline Other (See Comments)    "Yeast infection"   Oxycodone    Amoxicillin Rash    Review of Systems  Constitutional:  Negative for fever and malaise/fatigue.  HENT:  Negative for congestion.   Eyes:  Negative for blurred vision.  Respiratory:  Negative for shortness of breath.   Cardiovascular:  Negative for chest pain, palpitations and leg swelling.  Gastrointestinal:  Negative for abdominal pain,  blood in stool and nausea.  Genitourinary:  Negative for dysuria and frequency.  Musculoskeletal:  Negative for falls.  Skin:  Negative for rash.  Neurological:  Negative for dizziness, loss of consciousness and headaches.  Endo/Heme/Allergies:  Negative for environmental allergies.  Psychiatric/Behavioral:  Negative for depression. The patient is not nervous/anxious.        Objective:    Physical Exam Constitutional:      General: She  is not in acute distress.    Appearance: Normal appearance. She is well-developed. She is not toxic-appearing.  HENT:     Head: Normocephalic and atraumatic.     Right Ear: External ear normal.     Left Ear: External ear normal.     Nose: Nose normal.  Eyes:     General:        Right eye: No discharge.        Left eye: No discharge.     Conjunctiva/sclera: Conjunctivae normal.  Neck:     Thyroid: No thyromegaly.  Cardiovascular:     Rate and Rhythm: Normal rate and regular rhythm.     Heart sounds: Normal heart sounds. No murmur heard. Pulmonary:     Effort: Pulmonary effort is normal. No respiratory distress.     Breath sounds: Normal breath sounds.  Abdominal:     General: Bowel sounds are normal.     Palpations: Abdomen is soft.     Tenderness: There is no abdominal tenderness. There is no guarding.  Musculoskeletal:        General: Normal range of motion.     Cervical back: Neck supple.  Lymphadenopathy:     Cervical: No cervical adenopathy.  Skin:    General: Skin is warm and dry.  Neurological:     Mental Status: She is alert and oriented to person, place, and time.  Psychiatric:        Mood and Affect: Mood normal.        Behavior: Behavior normal.        Thought Content: Thought content normal.        Judgment: Judgment normal.    BP 126/74 (BP Location: Right Arm, Patient Position: Sitting, Cuff Size: Normal)   Pulse 74   Temp 98 F (36.7 C) (Oral)   Resp 16   Ht 5\' 6"  (1.676 m)   Wt 185 lb (83.9 kg)   SpO2 96%    BMI 29.86 kg/m  Wt Readings from Last 3 Encounters:  06/01/22 185 lb (83.9 kg)  03/16/22 185 lb 3.2 oz (84 kg)  01/26/22 187 lb 6.4 oz (85 kg)    Diabetic Foot Exam - Simple   No data filed    Lab Results  Component Value Date   WBC 8.6 05/26/2022   HGB 12.9 05/26/2022   HCT 39.4 05/26/2022   PLT 199.0 05/26/2022   GLUCOSE 106 (H) 05/26/2022   CHOL 173 05/26/2022   TRIG 81.0 05/26/2022   HDL 68.10 05/26/2022   LDLDIRECT 111.2 02/06/2014   LDLCALC 88 05/26/2022   ALT 11 05/26/2022   AST 13 05/26/2022   NA 137 05/26/2022   K 4.2 05/26/2022   CL 103 05/26/2022   CREATININE 0.69 05/26/2022   BUN 14 05/26/2022   CO2 28 05/26/2022   TSH 1.11 05/26/2022   INR 2.2 09/21/2011   HGBA1C 7.1 (H) 05/26/2022   MICROALBUR 1.3 05/28/2022    Lab Results  Component Value Date   TSH 1.11 05/26/2022   Lab Results  Component Value Date   WBC 8.6 05/26/2022   HGB 12.9 05/26/2022   HCT 39.4 05/26/2022   MCV 89.2 05/26/2022   PLT 199.0 05/26/2022   Lab Results  Component Value Date   NA 137 05/26/2022   K 4.2 05/26/2022   CHLORIDE 110 (H) 10/10/2015   CO2 28 05/26/2022   GLUCOSE 106 (H) 05/26/2022   BUN 14 05/26/2022   CREATININE 0.69 05/26/2022   BILITOT 0.6  05/26/2022   ALKPHOS 68 05/26/2022   AST 13 05/26/2022   ALT 11 05/26/2022   PROT 5.8 (L) 05/26/2022   ALBUMIN 3.8 05/26/2022   CALCIUM 9.2 05/26/2022   ANIONGAP 8 10/10/2015   EGFR 86 (L) 10/10/2015   GFR 82.79 05/26/2022   Lab Results  Component Value Date   CHOL 173 05/26/2022   Lab Results  Component Value Date   HDL 68.10 05/26/2022   Lab Results  Component Value Date   LDLCALC 88 05/26/2022   Lab Results  Component Value Date   TRIG 81.0 05/26/2022   Lab Results  Component Value Date   CHOLHDL 3 05/26/2022   Lab Results  Component Value Date   HGBA1C 7.1 (H) 05/26/2022       Assessment & Plan:  Diabetes mellitus type 2 in obese [facility]) Assessment & Plan: hgba1c acceptable, minimize  simple carbs. Increase exercise as tolerated. Continue current meds   Orders: -     Hemoglobin A1c; Future -     Comprehensive metabolic panel; Future -     TSH; Future  Mixed hyperlipidemia Assessment & Plan: Encourage heart healthy diet such as MIND or DASH diet, increase exercise, avoid trans fats, simple carbohydrates and processed foods, consider a krill or fish or flaxseed oil cap daily. Tolerating Rosuvastatin  Orders: -     Lipid panel; Future -     TSH; Future  Obesity with serious comorbidity, unspecified classification, unspecified obesity type Assessment & Plan: Encouraged DASH or MIND diet, decrease po intake and increase exercise as tolerated. Needs 7-8 hours of sleep nightly. Avoid trans fats, eat small, frequent meals every 4-5 hours with lean proteins, complex carbs and healthy fats. Minimize simple carbs, high fat foods and processed foods    Asthma, mild intermittent, well-controlled Assessment & Plan: No recent exacerbations, uses Symbicort daily and it helps but she does note after she takes the dose she can vomit sometimes. Encouraged to try Albuterol prior to using Symbicort and let us know if it continues.   Orders: -     CBC with Differential/Platelet; Future  Allergy, initial encounter Assessment & Plan: She is using Flonase and Singulair and can add Astelin and Zyrtec if worsens   Chronic pain of left knee Assessment & Plan: She is requesting a referral to [facility] orthor Dr Willette Cluster for consideration of TKR referral placed  Orders: -     Ambulatory referral to Orthopedic Surgery    Danise Edge, MD

## 2022-06-01 NOTE — Patient Instructions (Addendum)
[employer] website No artificial sweeteners, annulose, suralose, splenda etc.   Add Whey powder, collageen, yellow split pea powder, chia seeds, flaxseeds, egg white powder and protein powders without too many carbs or artificial sweeteners.    Asthma, Adult  Asthma is a long-term (chronic) condition that causes recurrent episodes in which the lower airways in the lungs become tight and narrow. The narrowing is caused by inflammation and tightening of the smooth muscle around the lower airways. Asthma episodes, also called asthma attacks or asthma flares, may cause coughing, making high-pitched whistling sounds when you breathe, most often when you breathe out (wheezing), shortness of breath, and chest pain. The airways may produce extra mucus caused by the inflammation and irritation. During an attack, it can be difficult to breathe. Asthma attacks can range from minor to life-threatening. Asthma cannot be cured, but medicines and lifestyle changes can help control it and treat acute attacks. It is important to keep your asthma well controlled so the condition does not interfere with your daily life. What are the causes? This condition is believed to be caused by inherited (genetic) and environmental factors, but its exact cause is not known. What can trigger an asthma attack? Many things can bring on an asthma attack or make symptoms worse. These triggers are different for every person. Common triggers include: Allergens and irritants like mold, dust, pet dander, cockroaches, pollen, air pollution, and chemical odors. Cigarette smoke. Weather changes and cold air. Stress and strong emotional responses such as crying or laughing hard. Certain medications such as aspirin or beta blockers. Infections and inflammatory conditions, such as the flu, a cold, pneumonia, or inflammation of the nasal membranes (rhinitis). Gastroesophageal reflux disease (GERD). What are the signs or symptoms? Symptoms  may occur right after exposure to an asthma trigger or hours later and can vary by person. Common signs and symptoms include: Wheezing. Trouble breathing (shortness of breath). Excessive nighttime or early morning coughing. Chest tightness. Tiredness (fatigue) with minimal activity. Difficulty talking in complete sentences. Poor exercise tolerance. How is this diagnosed? This condition is diagnosed based on: A physical exam and your medical history. Tests, which may include: Lung function studies to evaluate the flow of air in your lungs. Allergy tests. Imaging tests, such as X-rays. How is this treated? There is no cure, but symptoms can be controlled with proper treatment. Treatment usually involves: Identifying and avoiding your asthma triggers. Inhaled medicines. Two types are commonly used to treat asthma, depending on severity: Controller medicines. These help prevent asthma symptoms from occurring. They are taken every day. Fast-acting reliever or rescue medicines. These quickly relieve asthma symptoms. They are used as needed and provide short-term relief. Using other medicines, such as: Allergy medicines, such as antihistamines, if your asthma attacks are triggered by allergens. Immune medicines (immunomodulators). These are medicines that help control the immune system. Using supplemental oxygen. This is only needed during a severe episode. Creating an asthma action plan. An asthma action plan is a written plan for managing and treating your asthma attacks. This plan includes: A list of your asthma triggers and how to avoid them. Information about when medicines should be taken and when their dosage should be changed. Instructions about using a device called a peak flow meter. A peak flow meter measures how well the lungs are working and the severity of your asthma. It helps you monitor your condition. Follow these instructions at home: Take over-the-counter and prescription  medicines only as told by your health care provider.  Stay up to date on all vaccinations as recommended by your healthcare provider, including vaccines for the flu and pneumonia. Use a peak flow meter and keep track of your peak flow readings. Understand and use your asthma action plan to address any asthma flares. Do not smoke or allow anyone to smoke in your home. Contact a health care provider if: You have wheezing, shortness of breath, or a cough that is not responding to medicines. Your medicines are causing side effects, such as a rash, itching, swelling, or trouble breathing. You need to use a reliever medicine more than 2-3 times a week. Your peak flow reading is still at 50-79% of your personal best after following your action plan for 1 hour. You have a fever and shortness of breath. Get help right away if: You are getting worse and do not respond to treatment during an asthma attack. You are short of breath when at rest or when doing very little physical activity. You have difficulty eating, drinking, or talking. You have chest pain or tightness. You develop a fast heartbeat or palpitations. You have a bluish color to your lips or fingernails. You are light-headed or dizzy, or you faint. Your peak flow reading is less than 50% of your personal best. You feel too tired to breathe normally. These symptoms may be an emergency. Get help right away. Call 911. Do not wait to see if the symptoms will go away. Do not drive yourself to the hospital. Summary Asthma is a long-term (chronic) condition that causes recurrent episodes in which the airways become tight and narrow. Asthma episodes, also called asthma attacks or asthma flares, can cause coughing, wheezing, shortness of breath, and chest pain. Asthma cannot be cured, but medicines and lifestyle changes can help keep it well controlled and prevent asthma flares. Make sure you understand how to avoid triggers and how and when to use  your medicines. Asthma attacks can range from minor to life-threatening. Get help right away if you have an asthma attack and do not respond to treatment with your usual rescue medicines. This information is not intended to replace advice given to you by your health care provider. Make sure you discuss any questions you have with your health care provider. Document Revised: 01/15/2021 Document Reviewed: 01/06/2021 Elsevier Patient Education  Vicki Dennis.

## 2022-06-01 NOTE — Assessment & Plan Note (Signed)
She is using Flonase and Singulair and can add Astelin and Zyrtec if worsens

## 2022-06-01 NOTE — Assessment & Plan Note (Signed)
She is requesting a referral to [facility] orthor Dr Baron Hamper for consideration of TKR referral placed

## 2022-06-15 ENCOUNTER — Other Ambulatory Visit (HOSPITAL_BASED_OUTPATIENT_CLINIC_OR_DEPARTMENT_OTHER): Payer: Self-pay

## 2022-07-07 DIAGNOSIS — H25813 Combined forms of age-related cataract, bilateral: Secondary | ICD-10-CM | POA: Diagnosis not present

## 2022-07-07 DIAGNOSIS — H524 Presbyopia: Secondary | ICD-10-CM | POA: Diagnosis not present

## 2022-07-07 DIAGNOSIS — H5203 Hypermetropia, bilateral: Secondary | ICD-10-CM | POA: Diagnosis not present

## 2022-08-01 NOTE — Patient Instructions (Incomplete)
Moderate persistent asthma Stop Symbicort 160/4.5 mcg due to possible side effect Start Advair 115/21 mcg - taking 2 puffs twice a day with spacer to help prevent cough and wheeze May use albuterol 2 puffs every 4-6 hours as needed for cough,wheeze, tightness in chest, or shortness of breath. May also use albuterol 2 puffs 5-15 minutes prior to exercise Asthma control goals:  Full participation in all desired activities (may need albuterol before activity) Albuterol use two time or less a week on average (not counting use with activity) Cough interfering with sleep two time or less a month Oral steroids no more than once a year No hospitalizations  Seasonal and perennial allergic rhinitis (grasses, weeds, mold, and dust mite) Continue allergen avoidance measures as listed below May use saline nasal rinses as needed for nasal symptoms. Use this before any medicated nasal sprays for best result Start Flonase 2 sprays in each nostril once a day.  We will send in a prescription for this Continue azelastine nasal spray 2 sprays in each nostril twice a day to help with drainage/runny nose May use cetirizine 10 mg once a day as needed for a runny nose   Allergic conjunctivitis Recommend Pataday (olopatadine) eye drops one drop in each eye once a day as needed for red or itchy eyes. If insurance does not cover this you can buy this over the counter. Recommend stopping home made saline solution. You can speak with your eye doctor to see if he is ok with using your home made saline solution with iodized salt in your eyes  If you continue to have symptoms with vomiting, please schedule a follow up appointment with primary care physician. Consider starting reflux medication  Call the clinic if this treatment plan is not working well for you  Follow up in 6 weeks or sooner if needed.  Reducing Pollen Exposure The American Academy of Allergy, Asthma and Immunology suggests the following steps to  reduce your exposure to pollen during allergy seasons. Do not hang sheets or clothing out to dry; pollen may collect on these items. Do not mow lawns or spend time around freshly cut grass; mowing stirs up pollen. Keep windows closed at night.  Keep car windows closed while driving. Minimize morning activities outdoors, a time when pollen counts are usually at their highest. Stay indoors as much as possible when pollen counts or humidity is high and on windy days when pollen tends to remain in the air longer. Use air conditioning when possible.  Many air conditioners have filters that trap the pollen spores. Use a HEPA room air filter to remove pollen form the indoor air you breathe.  Control of Mold Allergen Mold and fungi can grow on a variety of surfaces provided certain temperature and moisture conditions exist.  Outdoor molds grow on plants, decaying vegetation and soil.  The major outdoor mold, Alternaria and Cladosporium, are found in very high numbers during hot and dry conditions.  Generally, a late Summer - Fall peak is seen for common outdoor fungal spores.  Rain will temporarily lower outdoor mold spore count, but counts rise rapidly when the rainy period ends.  The most important indoor molds are Aspergillus and Penicillium.  Dark, humid and poorly ventilated basements are ideal sites for mold growth.  The next most common sites of mold growth are the bathroom and the kitchen.  Outdoor [employer] Use air conditioning and keep windows closed Avoid exposure to decaying vegetation. Avoid leaf raking. Avoid grain handling. Consider  wearing a face mask if working in moldy areas.  Indoor Mold Control Maintain humidity below 50%. Clean washable surfaces with 5% bleach solution. Remove sources e.g. Contaminated carpets.   Control of Dust Mite Allergen Dust mites play a major role in allergic asthma and rhinitis. They occur in environments with high humidity wherever human skin is  found. Dust mites absorb humidity from the atmosphere (ie, they do not drink) and feed on organic matter (including shed human and animal skin). Dust mites are a microscopic type of insect that you cannot see with the naked eye. High levels of dust mites have been detected from mattresses, pillows, carpets, upholstered furniture, bed covers, clothes, soft toys and any woven material. The principal allergen of the dust mite is found in its feces. A gram of dust may contain 1,000 mites and 250,000 fecal particles. Mite antigen is easily measured in the air during house cleaning activities. Dust mites do not bite and do not cause harm to humans, other than by triggering allergies/asthma.  Ways to decrease your exposure to dust mites in your home:  1. Encase mattresses, box springs and pillows with a mite-impermeable barrier or cover  2. Wash sheets, blankets and drapes weekly in hot water (130 F) with detergent and dry them in a dryer on the hot setting.  3. Have the room cleaned frequently with a vacuum cleaner and a damp dust-mop. For carpeting or rugs, vacuuming with a vacuum cleaner equipped with a high-efficiency particulate air (HEPA) filter. The dust mite allergic individual should not be in a room which is being cleaned and should wait 1 hour after cleaning before going into the room.  4. Do not sleep on upholstered furniture (eg, couches).  5. If possible removing carpeting, upholstered furniture and drapery from the home is ideal. Horizontal blinds should be eliminated in the rooms where the person spends the most time (bedroom, study, television room). Washable vinyl, roller-type shades are optimal.  6. Remove all non-washable stuffed toys from the bedroom. Wash stuffed toys weekly like sheets and blankets above.  7. Reduce indoor humidity to less than 50%. Inexpensive humidity monitors can be purchased at most hardware stores. Do not use a humidifier as can make the problem worse and are  not recommended.

## 2022-08-03 ENCOUNTER — Encounter: Payer: Self-pay | Admitting: Family

## 2022-08-03 ENCOUNTER — Other Ambulatory Visit: Payer: Self-pay

## 2022-08-03 ENCOUNTER — Ambulatory Visit: Payer: Medicare PPO | Admitting: Family

## 2022-08-03 VITALS — BP 160/80 | HR 67 | Temp 98.4°F | Ht 66.0 in | Wt 188.7 lb

## 2022-08-03 DIAGNOSIS — T6391XD Toxic effect of contact with unspecified venomous animal, accidental (unintentional), subsequent encounter: Secondary | ICD-10-CM | POA: Diagnosis not present

## 2022-08-03 DIAGNOSIS — H1010 Acute atopic conjunctivitis, unspecified eye: Secondary | ICD-10-CM

## 2022-08-03 DIAGNOSIS — J302 Other seasonal allergic rhinitis: Secondary | ICD-10-CM

## 2022-08-03 DIAGNOSIS — H1013 Acute atopic conjunctivitis, bilateral: Secondary | ICD-10-CM | POA: Diagnosis not present

## 2022-08-03 DIAGNOSIS — J4540 Moderate persistent asthma, uncomplicated: Secondary | ICD-10-CM | POA: Diagnosis not present

## 2022-08-03 MED ORDER — ALBUTEROL SULFATE HFA 108 (90 BASE) MCG/ACT IN AERS: 2.0000 | INHALATION_SPRAY | RESPIRATORY_TRACT | 1 refills | Status: DC | PRN

## 2022-08-03 MED ORDER — OLOPATADINE HCL 0.2 % OP SOLN: OPHTHALMIC | 5 refills | Status: DC

## 2022-08-03 MED ORDER — FLUTICASONE-SALMETEROL 115-21 MCG/ACT IN AERO: 2.0000 | INHALATION_SPRAY | Freq: Two times a day (BID) | RESPIRATORY_TRACT | 5 refills | Status: DC

## 2022-08-03 MED ORDER — EPINEPHRINE 0.3 MG/0.3ML IJ SOSY: PREFILLED_SYRINGE | INTRAMUSCULAR | 1 refills | Status: AC

## 2022-08-03 MED ORDER — FLUTICASONE PROPIONATE 50 MCG/ACT NA SUSP: NASAL | 5 refills | Status: AC

## 2022-08-03 MED ORDER — AZELASTINE HCL 0.1 % NA SOLN: NASAL | 5 refills | Status: DC

## 2022-08-03 NOTE — Progress Notes (Signed)
[address] [place] [address] Dept: [phone]  FOLLOW UP NOTE  Patient ID: Vicki Dennis, female    DOB: [birth year]  Age: 77 y.o. MRN: [id number] Date of Office Visit: 08/03/2022  Assessment  Chief Complaint: Follow-up, Nasal Congestion, Cough, and Sinus Problem  HPI Vicki Dennis is a 77 year old female who presents today for follow-up of acute nonrecurrent sinusitis, moderate persistent asthma without complication, seasonal and perennial allergic rhinitis, and seasonal allergic conjunctivitis.  She was last seen on Aug 18, 2021 by myself.  She denies any new diagnosis or surgeries, but reports that she  will soon be getting gel injections for her left knee because it is bone-on-bone.  Moderate persistent asthma: She is currently using Symbicort 160/4.5 mcg 1 puff in the morning.  She just recently started this medication back in March and will usually stay on this until the pollen stops.  She reports a cough that is at times productive with clear sputum and other times it is not as clear.  She also reports, wheezing, tightness in chest, nocturnal awakenings due to phlegm, and shortness of breath sometimes.  She denies fever or chills.  Since her last office visit she has not required any systemic steroids or made any trips to the emergency room or urgent care due to breathing problems.  She has not used her albuterol inhaler.  She has noticed that sometimes if she eats breakfast and then uses her Symbicort that she will throw up.  She reports that she cannot get past the gag reflux.  She reports that Symbicort has a bitter taste and she feels like this is the cause of her throwing up.  She does rinse her mouth out after using Symbicort.  This occurs approximately once a week or less than that.  She also reports that once asthma season is over this stops.  She feels that tree pollen is the main issue.  Sometimes if she takes Symbicort before breakfast and then eats she will throw up maybe  2 hours later.  She eats different things for breakfast such as eggs or salads.  Seasonal and perennial allergic rhinitis: She reports nasal congestion and postnasal drip.  She denies rhinorrhea.  She is using azelastine nasal spray daily and Mucinex daily.  She is not taking an antihistamine.  She tries not to mix up too much medications.  She is not using Flonase nasal spray.  She has not had any sinus infections since we last saw her.  Allergic conjunctivitis: She reports itchy watery eyes.  She has been using a homemade saline solution with iodized salt.  She reports that she has been doing this ever since she was little.   Drug Allergies:  Allergies  Allergen Reactions   Codeine Other (See Comments)    "makes me crazy"   Hornet Venom Anaphylaxis   Sulfa Antibiotics Anaphylaxis    "died 3 times".   Doxycycline Other (See Comments)    "Yeast infection"   Oxycodone    Amoxicillin Rash    Review of Systems: Review of Systems  Constitutional:  Negative for chills and fever.  HENT:         Reports nasal congestion and postnasal drip.  Denies rhinorrhea  Respiratory:  Positive for cough, shortness of breath and wheezing.   Cardiovascular:  Negative for chest pain and palpitations.  Gastrointestinal:        Reports probable reflux  Skin:  Positive for itching. Negative for rash.  Reports itchy skin at times.  Denies rash  Neurological:  Negative for headaches.  Endo/Heme/Allergies:  Positive for environmental allergies.     Physical Exam: BP (!) 160/80   Pulse 67   Temp 98.4 F (36.9 C)   Ht 5\' 6"  (1.676 m)   Wt 188 lb 11.2 oz (85.6 kg)   SpO2 98%   BMI 30.46 kg/m    Physical Exam Constitutional:      Appearance: Normal appearance.  HENT:     Head: Normocephalic and atraumatic.     Comments: Pharynx normal, eyes normal, ears normal, nose: Bilateral lower turbinates mildly edematous with no drainage noted    Right Ear: Tympanic membrane, ear canal and external ear  normal.     Left Ear: Tympanic membrane, ear canal and external ear normal.     Mouth/Throat:     Mouth: Mucous membranes are moist.     Pharynx: Oropharynx is clear.  Eyes:     Conjunctiva/sclera: Conjunctivae normal.  Cardiovascular:     Rate and Rhythm: Regular rhythm.     Heart sounds: Normal heart sounds.  Pulmonary:     Effort: Pulmonary effort is normal.     Breath sounds: Normal breath sounds.     Comments: Lungs clear to auscultation Musculoskeletal:     Cervical back: Neck supple.  Skin:    General: Skin is warm.  Neurological:     Mental Status: She is alert and oriented to person, place, and time.  Psychiatric:        Mood and Affect: Mood normal.        Behavior: Behavior normal.        Thought Content: Thought content normal.        Judgment: Judgment normal.     Diagnostics: FVC 2.60 L (110%), FEV1 2.00 L (111%).  Spirometry indicates normal spirometry.  Assessment and Plan: 1. Not well controlled moderate persistent asthma   2. Seasonal and perennial allergic rhinoconjunctivitis   3. Venom-induced anaphylaxis, accidental or unintentional, subsequent encounter     Meds ordered this encounter  Medications   fluticasone-salmeterol (ADVAIR HFA) 115-21 MCG/ACT inhaler    Sig: Inhale 2 puffs into the lungs 2 (two) times daily.    Dispense:  1 each    Refill:  5   EPINEPHrine 0.3 MG/0.3ML SOSY    Sig: Use as directed for severe allergic reactions    Dispense:  2 each    Refill:  1   fluticasone (FLONASE) 50 MCG/ACT nasal spray    Sig: Place 2 sprays in each nostril once a day as needed for stuffy nose    Dispense:  16 g    Refill:  5   albuterol (PROVENTIL HFA) 108 (90 Base) MCG/ACT inhaler    Sig: Inhale 2 puffs into the lungs every 4 (four) hours as needed for wheezing or shortness of breath.    Dispense:  18 g    Refill:  1   azelastine (ASTELIN) 0.1 % nasal spray    Sig: Place 1 to 2 sprays in each nostril 1-2 times a day as needed for runny  nose/drainage down throat    Dispense:  30 mL    Refill:  5   Olopatadine HCl 0.2 % SOLN    Sig: Place 1 drop in each eye once a day as needed for itchy watery eyes    Dispense:  2.5 mL    Refill:  5    Patient Instructions  Moderate persistent asthma  Stop Symbicort 160/4.5 mcg due to possible side effect Start Advair 115/21 mcg - taking 2 puffs twice a day with spacer to help prevent cough and wheeze May use albuterol 2 puffs every 4-6 hours as needed for cough,wheeze, tightness in chest, or shortness of breath. May also use albuterol 2 puffs 5-15 minutes prior to exercise Asthma control goals:  Full participation in all desired activities (may need albuterol before activity) Albuterol use two time or less a week on average (not counting use with activity) Cough interfering with sleep two time or less a month Oral steroids no more than once a year No hospitalizations  Seasonal and perennial allergic rhinitis (grasses, weeds, mold, and dust mite) Continue allergen avoidance measures as listed below May use saline nasal rinses as needed for nasal symptoms. Use this before any medicated nasal sprays for best result Start Flonase 2 sprays in each nostril once a day.  We will send in a prescription for this Continue azelastine nasal spray 2 sprays in each nostril twice a day to help with drainage/runny nose May use cetirizine 10 mg once a day as needed for a runny nose   Allergic conjunctivitis Recommend Pataday (olopatadine) eye drops one drop in each eye once a day as needed for red or itchy eyes. If insurance does not cover this you can buy this over the counter. Recommend stopping home made saline solution. You can speak with your eye doctor to see if he is ok with using your home made saline solution with iodized salt in your eyes  If you continue to have symptoms with vomiting, please schedule a follow up appointment with primary care physician. Consider starting reflux medication   Call the clinic if this treatment plan is not working well for you  Follow up in 6 weeks or sooner if needed.  Reducing Pollen Exposure The American Academy of Allergy, Asthma and Immunology suggests the following steps to reduce your exposure to pollen during allergy seasons. Do not hang sheets or clothing out to dry; pollen may collect on these items. Do not mow lawns or spend time around freshly cut grass; mowing stirs up pollen. Keep windows closed at night.  Keep car windows closed while driving. Minimize morning activities outdoors, a time when pollen counts are usually at their highest. Stay indoors as much as possible when pollen counts or humidity is high and on windy days when pollen tends to remain in the air longer. Use air conditioning when possible.  Many air conditioners have filters that trap the pollen spores. Use a HEPA room air filter to remove pollen form the indoor air you breathe.  Control of Mold Allergen Mold and fungi can grow on a variety of surfaces provided certain temperature and moisture conditions exist.  Outdoor molds grow on plants, decaying vegetation and soil.  The major outdoor mold, Alternaria and Cladosporium, are found in very high numbers during hot and dry conditions.  Generally, a late Summer - Fall peak is seen for common outdoor fungal spores.  Rain will temporarily lower outdoor mold spore count, but counts rise rapidly when the rainy period ends.  The most important indoor molds are Aspergillus and Penicillium.  Dark, humid and poorly ventilated basements are ideal sites for mold growth.  The next most common sites of mold growth are the bathroom and the kitchen.  Outdoor [employer] Use air conditioning and keep windows closed Avoid exposure to decaying vegetation. Avoid leaf raking. Avoid grain handling. Consider wearing a face  mask if working in moldy areas.  Indoor Mold Control Maintain humidity below 50%. Clean washable surfaces with 5%  bleach solution. Remove sources e.g. Contaminated carpets.   Control of Dust Mite Allergen Dust mites play a major role in allergic asthma and rhinitis. They occur in environments with high humidity wherever human skin is found. Dust mites absorb humidity from the atmosphere (ie, they do not drink) and feed on organic matter (including shed human and animal skin). Dust mites are a microscopic type of insect that you cannot see with the naked eye. High levels of dust mites have been detected from mattresses, pillows, carpets, upholstered furniture, bed covers, clothes, soft toys and any woven material. The principal allergen of the dust mite is found in its feces. A gram of dust may contain 1,000 mites and 250,000 fecal particles. Mite antigen is easily measured in the air during house cleaning activities. Dust mites do not bite and do not cause harm to humans, other than by triggering allergies/asthma.  Ways to decrease your exposure to dust mites in your home:  1. Encase mattresses, box springs and pillows with a mite-impermeable barrier or cover  2. Wash sheets, blankets and drapes weekly in hot water (130 F) with detergent and dry them in a dryer on the hot setting.  3. Have the room cleaned frequently with a vacuum cleaner and a damp dust-mop. For carpeting or rugs, vacuuming with a vacuum cleaner equipped with a high-efficiency particulate air (HEPA) filter. The dust mite allergic individual should not be in a room which is being cleaned and should wait 1 hour after cleaning before going into the room.  4. Do not sleep on upholstered furniture (eg, couches).  5. If possible removing carpeting, upholstered furniture and drapery from the home is ideal. Horizontal blinds should be eliminated in the rooms where the person spends the most time (bedroom, study, television room). Washable vinyl, roller-type shades are optimal.  6. Remove all non-washable stuffed toys from the bedroom. Wash stuffed  toys weekly like sheets and blankets above.  7. Reduce indoor humidity to less than 50%. Inexpensive humidity monitors can be purchased at most hardware stores. Do not use a humidifier as can make the problem worse and are not recommended.  Return in about 6 weeks (around 09/14/2022).    Thank you for the opportunity to care for this patient.  Please do not hesitate to contact me with questions.  Nehemiah Settle, FNP [facility]

## 2022-08-04 NOTE — Addendum Note (Signed)
Addended by: Kellie Simmering, Mallarie Voorhies on: 08/04/2022 12:09 PM   Modules accepted: Orders

## 2022-08-31 ENCOUNTER — Other Ambulatory Visit (HOSPITAL_BASED_OUTPATIENT_CLINIC_OR_DEPARTMENT_OTHER): Payer: Self-pay

## 2022-09-07 NOTE — Assessment & Plan Note (Signed)
No recent exacerbation 

## 2022-09-07 NOTE — Assessment & Plan Note (Signed)
hgba1c acceptable, minimize simple carbs. Increase exercise as tolerated. Continue current meds 

## 2022-09-07 NOTE — Assessment & Plan Note (Signed)
Encourage heart healthy diet such as MIND or DASH diet, increase exercise, avoid trans fats, simple carbohydrates and processed foods, consider a krill or fish or flaxseed oil cap daily. Tolerating Rosuvastatin 

## 2022-09-08 ENCOUNTER — Ambulatory Visit: Payer: Medicare PPO | Admitting: Family

## 2022-09-08 ENCOUNTER — Other Ambulatory Visit (HOSPITAL_BASED_OUTPATIENT_CLINIC_OR_DEPARTMENT_OTHER): Payer: Self-pay

## 2022-09-08 ENCOUNTER — Ambulatory Visit: Payer: Medicare PPO | Admitting: Family Medicine

## 2022-09-08 VITALS — BP 128/76 | HR 75 | Temp 98.0°F | Resp 16 | Ht 66.0 in | Wt 185.6 lb

## 2022-09-08 DIAGNOSIS — E669 Obesity, unspecified: Secondary | ICD-10-CM | POA: Diagnosis not present

## 2022-09-08 DIAGNOSIS — J4540 Moderate persistent asthma, uncomplicated: Secondary | ICD-10-CM | POA: Diagnosis not present

## 2022-09-08 DIAGNOSIS — Z7984 Long term (current) use of oral hypoglycemic drugs: Secondary | ICD-10-CM

## 2022-09-08 DIAGNOSIS — E663 Overweight: Secondary | ICD-10-CM | POA: Diagnosis not present

## 2022-09-08 DIAGNOSIS — Z6829 Body mass index (BMI) 29.0-29.9, adult: Secondary | ICD-10-CM | POA: Diagnosis not present

## 2022-09-08 DIAGNOSIS — J4520 Mild intermittent asthma, uncomplicated: Secondary | ICD-10-CM

## 2022-09-08 DIAGNOSIS — E782 Mixed hyperlipidemia: Secondary | ICD-10-CM | POA: Diagnosis not present

## 2022-09-08 DIAGNOSIS — E1169 Type 2 diabetes mellitus with other specified complication: Secondary | ICD-10-CM | POA: Diagnosis not present

## 2022-09-08 LAB — COMPREHENSIVE METABOLIC PANEL
ALT: 10 U/L (ref 0–35)
AST: 13 U/L (ref 0–37)
Albumin: 4 g/dL (ref 3.5–5.2)
Alkaline Phosphatase: 69 U/L (ref 39–117)
BUN: 14 mg/dL (ref 6–23)
CO2: 26 mEq/L (ref 19–32)
Calcium: 9.3 mg/dL (ref 8.4–10.5)
Chloride: 104 mEq/L (ref 96–112)
Creatinine, Ser: 0.69 mg/dL (ref 0.40–1.20)
GFR: 82.63 mL/min (ref >60.00)
Glucose, Bld: 98 mg/dL (ref 70–99)
Potassium: 4.6 mEq/L (ref 3.5–5.1)
Sodium: 139 mEq/L (ref 135–145)
Total Bilirubin: 0.4 mg/dL (ref 0.2–1.2)
Total Protein: 6.3 g/dL (ref 6.0–8.3)

## 2022-09-08 LAB — LIPID PANEL
Cholesterol: 167 mg/dL (ref 0–200)
HDL: 77.7 mg/dL (ref >39.00)
LDL Cholesterol: 70 mg/dL (ref 0–99)
NonHDL: 89.12
Total CHOL/HDL Ratio: 2
Triglycerides: 96 mg/dL (ref 0.0–149.0)
VLDL: 19.2 mg/dL (ref 0.0–40.0)

## 2022-09-08 LAB — CBC WITH DIFFERENTIAL/PLATELET
Basophils Absolute: 0.1 10*3/uL (ref 0.0–0.1)
Basophils Relative: 1 % (ref 0.0–3.0)
Eosinophils Absolute: 0.3 10*3/uL (ref 0.0–0.7)
Eosinophils Relative: 3.9 % (ref 0.0–5.0)
HCT: 41.8 % (ref 36.0–46.0)
Hemoglobin: 13.5 g/dL (ref 12.0–15.0)
Lymphocytes Relative: 27.9 % (ref 12.0–46.0)
Lymphs Abs: 2.1 10*3/uL (ref 0.7–4.0)
MCHC: 32.3 g/dL (ref 30.0–36.0)
MCV: 88.9 fl (ref 78.0–100.0)
Monocytes Absolute: 0.6 10*3/uL (ref 0.1–1.0)
Monocytes Relative: 8.1 % (ref 3.0–12.0)
Neutro Abs: 4.4 10*3/uL (ref 1.4–7.7)
Neutrophils Relative %: 59.1 % (ref 43.0–77.0)
Platelets: 216 10*3/uL (ref 150.0–400.0)
RBC: 4.7 Mil/uL (ref 3.87–5.11)
RDW: 14.2 % (ref 11.5–15.5)
WBC: 7.5 10*3/uL (ref 4.0–10.5)

## 2022-09-08 LAB — TSH: TSH: 1.02 u[IU]/mL (ref 0.35–5.50)

## 2022-09-08 LAB — HEMOGLOBIN A1C: Hgb A1c MFr Bld: 7 % — ABNORMAL HIGH (ref 4.6–6.5)

## 2022-09-08 NOTE — Patient Instructions (Addendum)
Respiratory Syncitial Virus vaccine (RSV), Arexvy at pharmacy   Asthma, Adult  Asthma is a long-term (chronic) condition that causes recurrent episodes in which the lower airways in the lungs become tight and narrow. The narrowing is caused by inflammation and tightening of the smooth muscle around the lower airways. Asthma episodes, also called asthma attacks or asthma flares, may cause coughing, making high-pitched whistling sounds when you breathe, most often when you breathe out (wheezing), shortness of breath, and chest pain. The airways may produce extra mucus caused by the inflammation and irritation. During an attack, it can be difficult to breathe. Asthma attacks can range from minor to life-threatening. Asthma cannot be cured, but medicines and lifestyle changes can help control it and treat acute attacks. It is important to keep your asthma well controlled so the condition does not interfere with your daily life. What are the causes? This condition is believed to be caused by inherited (genetic) and environmental factors, but its exact cause is not known. What can trigger an asthma attack? Many things can bring on an asthma attack or make symptoms worse. These triggers are different for every person. Common triggers include: Allergens and irritants like mold, dust, pet dander, cockroaches, pollen, air pollution, and chemical odors. Cigarette smoke. Weather changes and cold air. Stress and strong emotional responses such as crying or laughing hard. Certain medications such as aspirin or beta blockers. Infections and inflammatory conditions, such as the flu, a cold, pneumonia, or inflammation of the nasal membranes (rhinitis). Gastroesophageal reflux disease (GERD). What are the signs or symptoms? Symptoms may occur right after exposure to an asthma trigger or hours later and can vary by person. Common signs and symptoms include: Wheezing. Trouble breathing (shortness of  breath). Excessive nighttime or early morning coughing. Chest tightness. Tiredness (fatigue) with minimal activity. Difficulty talking in complete sentences. Poor exercise tolerance. How is this diagnosed? This condition is diagnosed based on: A physical exam and your medical history. Tests, which may include: Lung function studies to evaluate the flow of air in your lungs. Allergy tests. Imaging tests, such as X-rays. How is this treated? There is no cure, but symptoms can be controlled with proper treatment. Treatment usually involves: Identifying and avoiding your asthma triggers. Inhaled medicines. Two types are commonly used to treat asthma, depending on severity: Controller medicines. These help prevent asthma symptoms from occurring. They are taken every day. Fast-acting reliever or rescue medicines. These quickly relieve asthma symptoms. They are used as needed and provide short-term relief. Using other medicines, such as: Allergy medicines, such as antihistamines, if your asthma attacks are triggered by allergens. Immune medicines (immunomodulators). These are medicines that help control the immune system. Using supplemental oxygen. This is only needed during a severe episode. Creating an asthma action plan. An asthma action plan is a written plan for managing and treating your asthma attacks. This plan includes: A list of your asthma triggers and how to avoid them. Information about when medicines should be taken and when their dosage should be changed. Instructions about using a device called a peak flow meter. A peak flow meter measures how well the lungs are working and the severity of your asthma. It helps you monitor your condition. Follow these instructions at home: Take over-the-counter and prescription medicines only as told by your health care provider. Stay up to date on all vaccinations as recommended by your healthcare provider, including vaccines for the flu and  pneumonia. Use a peak flow meter and keep track  of your peak flow readings. Understand and use your asthma action plan to address any asthma flares. Do not smoke or allow anyone to smoke in your home. Contact a health care provider if: You have wheezing, shortness of breath, or a cough that is not responding to medicines. Your medicines are causing side effects, such as a rash, itching, swelling, or trouble breathing. You need to use a reliever medicine more than 2-3 times a week. Your peak flow reading is still at 50-79% of your personal best after following your action plan for 1 hour. You have a fever and shortness of breath. Get help right away if: You are getting worse and do not respond to treatment during an asthma attack. You are short of breath when at rest or when doing very little physical activity. You have difficulty eating, drinking, or talking. You have chest pain or tightness. You develop a fast heartbeat or palpitations. You have a bluish color to your lips or fingernails. You are light-headed or dizzy, or you faint. Your peak flow reading is less than 50% of your personal best. You feel too tired to breathe normally. These symptoms may be an emergency. Get help right away. Call 911. Do not wait to see if the symptoms will go away. Do not drive yourself to the hospital. Summary Asthma is a long-term (chronic) condition that causes recurrent episodes in which the airways become tight and narrow. Asthma episodes, also called asthma attacks or asthma flares, can cause coughing, wheezing, shortness of breath, and chest pain. Asthma cannot be cured, but medicines and lifestyle changes can help keep it well controlled and prevent asthma flares. Make sure you understand how to avoid triggers and how and when to use your medicines. Asthma attacks can range from minor to life-threatening. Get help right away if you have an asthma attack and do not respond to treatment with your  usual rescue medicines. This information is not intended to replace advice given to you by your health care provider. Make sure you discuss any questions you have with your health care provider. Document Revised: 01/15/2021 Document Reviewed: 01/06/2021 Elsevier Patient Education  2024 [employer].

## 2022-09-08 NOTE — Progress Notes (Signed)
Subjective:   By signing my name below, I, Vicki Dennis, attest that this documentation has been prepared under the direction and in the presence of Bradd Canary, MD., 09/08/2022.   Patient ID: Vicki Dennis, female    DOB: Dec 05, 1945, 77 y.o.   MRN: [id number]  No chief complaint on file.  HPI Patient is in today for an office visit. She denies recent hospitalization, febrile illness, CP/palpitations/SOB/HA/fever/chills/GI or GU symptoms.  Asthma/Allergies Patient continues using Albuterol inhaler, Azelastine 0.1% nasal spray, Fluticasone nasal spray, and taking Montelukast 10 mg to manage her asthma and allergies. These have been tolerable.  Hyperlipidemia Patient continues taking Rosuvastatin 10 mg daily which has been tolerable.  Seborrhea Keratoses Patient presents with multiple seborrhea keratoses bilaterally across her arms and legs. She denies associated pain or itchiness and will follow with a dermatologist.   Type 2 Diabetes Mellitus Patient continues to monitor her blood glucose and has been attempting to maintain a balanced diet. Lab Results  Component Value Date   HGBA1C 7.1 (H) 05/26/2022   Wt Readings from Last 3 Encounters:  09/08/22 185 lb 9.6 oz (84.2 kg)  08/03/22 188 lb 11.2 oz (85.6 kg)  06/01/22 185 lb (83.9 kg)   Past Medical History:  Diagnosis Date   Allergy    Anemia    h/o   Asthma    Asthma, mild intermittent, well-controlled 02/10/2014   Blood transfusion without reported diagnosis    Chicken pox as a child   Diabetes mellitus    type-II, per Dr. Parke Simmers, no meds. , pt. reports weight loss has contributed  to control of diabetes.  She reports last HgbA1c- wnl.   Diabetes mellitus 11/05/2010   Diabetes mellitus type 2 in obese 11/05/2010   Diabetes mellitus type 2, uncontrolled 11/05/2010   Esophageal reflux 02/10/2014   GERD (gastroesophageal reflux disease)    rare use of OTC aid   Hyperglycemia 09/02/2015   Hyperlipidemia     Hypertension    Measles as a child   Measles    Medicare annual wellness visit, subsequent 08/06/2014   Mumps as a child   Obesity 02/10/2014   Pneumonia    as a child   Portal vein thrombosis 03/09/2011   coumadin ended summer of 2013   Preventative health care 08/12/2014   Rash and nonspecific skin eruption 01/07/2015   Seborrheic keratosis 02/10/2014   Follows with dermatology   Sinusitis, acute 03/23/2016    Past Surgical History:  Procedure Laterality Date   BREAST BIOPSY Bilateral    INSERTION OF MESH  03/16/2012   Procedure: INSERTION OF MESH;  Surgeon: Shelly Rubenstein, MD;  Location: [facility] OR;  Service: General;  Laterality: N/A;   KNEE ARTHROSCOPY W/ MENISCAL REPAIR     both knees   TONSILLECTOMY     UMBILICAL HERNIA REPAIR  03/16/2012   Procedure: HERNIA REPAIR UMBILICAL ADULT;  Surgeon: Shelly Rubenstein, MD;  Location: [facility] OR;  Service: General;  Laterality: N/A;    Family History  Problem Relation Age of Onset   Cancer Father        prostate   Aneurysm Mother 35       brain   Cancer Maternal Aunt     Social History   Socioeconomic History   Marital status: Divorced    Spouse name: Not on file   Number of children: Not on file   Years of education: Not on file   Highest education level: Not on file  Occupational History   Not on file  Tobacco Use   Smoking status: Never   Smokeless tobacco: Never   Tobacco comments:    never used tobacco  Vaping Use   Vaping Use: Never used  Substance and Sexual Activity   Alcohol use: Yes    Alcohol/week: 0.0 standard drinks of alcohol    Comment: for holidays   Drug use: No   Sexual activity: Not Currently    Comment: lives alone   Other Topics Concern   Not on file  Social History Narrative   Retired Runner, broadcasting/film/video   Social Determinants of Corporate investment banker Strain: Not on file  Food Insecurity: Not on file  Transportation Needs: Not on file  Physical Activity: Not on file  Stress: Not on file   Social Connections: Not on file  Intimate Partner Violence: Not on file    Outpatient Medications Prior to Visit  Medication Sig Dispense Refill   ACCU-CHEK AVIVA PLUS test strip Use to check blood sugar once daily as directed 100 each 3   albuterol (PROVENTIL HFA) 108 (90 Base) MCG/ACT inhaler Inhale 2 puffs into the lungs every 4 (four) hours as needed for wheezing or shortness of breath. 18 g 1   azelastine (ASTELIN) 0.1 % nasal spray Place 1 to 2 sprays in each nostril 1-2 times a day as needed for runny nose/drainage down throat 30 mL 5   blood glucose meter kit and supplies KIT Dispense based on patient and insurance preference. Use up to four times daily as directed. (FOR ICD-9 250.00, 250.01). 1 each 0   diphenoxylate-atropine (LOMOTIL) 2.5-0.025 MG tablet Take 1 tablet by mouth 4 (four) times daily as needed for diarrhea or loose stools. 30 tablet 0   enalapril (VASOTEC) 2.5 MG tablet TAKE ONE TABLET BY MOUTH ONE TIME DAILY 90 tablet 1   EPINEPHrine 0.3 MG/0.3ML SOSY Use as directed for severe allergic reactions 2 each 1   fluticasone (FLONASE) 50 MCG/ACT nasal spray Place 2 sprays in each nostril once a day as needed for stuffy nose 16 g 5   fluticasone-salmeterol (ADVAIR HFA) 115-21 MCG/ACT inhaler Inhale 2 puffs into the lungs 2 (two) times daily. 1 each 5   Lancets 30G MISC Use as directed once daily to check blood sugar.  Diagnosis code E11.9 100 each 5   meloxicam (MOBIC) 7.5 MG tablet Take 1-2 tablets (7.5-15 mg total) by mouth daily as needed for pain. (Patient not taking: Reported on 08/03/2022) 60 tablet 3   montelukast (SINGULAIR) 10 MG tablet Take 1 tablet (10 mg total) by mouth at bedtime. 90 tablet 5   Multiple Vitamins-Minerals (MULTIVITAMIN WITH MINERALS) tablet Take 1 tablet by mouth daily.     Olopatadine HCl 0.2 % SOLN Place 1 drop in each eye once a day as needed for itchy watery eyes 2.5 mL 5   Omega-3 Fatty Acids (FISH OIL CONCENTRATE) 1000 MG CAPS Take by mouth 3  (three) times daily.     rosuvastatin (CRESTOR) 10 MG tablet Take 1 tablet (10 mg total) by mouth daily. 90 tablet 3   No facility-administered medications prior to visit.    Allergies  Allergen Reactions   Codeine Other (See Comments)    "makes me crazy"   Hornet Venom Anaphylaxis   Sulfa Antibiotics Anaphylaxis    "died 3 times".   Doxycycline Other (See Comments)    "Yeast infection"   Oxycodone    Amoxicillin Rash    Review of Systems  Constitutional:  Negative for chills and fever.  Respiratory:  Negative for shortness of breath.   Cardiovascular:  Negative for chest pain and palpitations.  Gastrointestinal:  Negative for abdominal pain, blood in stool, constipation, diarrhea, nausea and vomiting.  Genitourinary:  Negative for dysuria, frequency, hematuria and urgency.  Skin:           Neurological:  Negative for headaches.       Objective:    Physical Exam Constitutional:      General: She is not in acute distress.    Appearance: Normal appearance. She is not ill-appearing.  HENT:     Head: Normocephalic and atraumatic.     Right Ear: External ear normal.     Left Ear: External ear normal.     Nose: Nose normal.     Mouth/Throat:     Mouth: Mucous membranes are moist.     Pharynx: Oropharynx is clear.  Eyes:     General:        Right eye: No discharge.        Left eye: No discharge.     Extraocular Movements: Extraocular movements intact.     Conjunctiva/sclera: Conjunctivae normal.     Pupils: Pupils are equal, round, and reactive to light.  Cardiovascular:     Rate and Rhythm: Normal rate and regular rhythm.     Pulses: Normal pulses.     Heart sounds: Normal heart sounds. No murmur heard.    No gallop.  Pulmonary:     Effort: Pulmonary effort is normal. No respiratory distress.     Breath sounds: Normal breath sounds. No wheezing or rales.  Abdominal:     General: Bowel sounds are normal.     Palpations: Abdomen is soft.     Tenderness: There is  no abdominal tenderness. There is no guarding.  Musculoskeletal:        General: Normal range of motion.     Cervical back: Normal range of motion.     Right lower leg: No edema.     Left lower leg: No edema.  Skin:    General: Skin is warm and dry.     Comments: Multiple seborrhea keratoses bilaterally across the arms and legs.  Neurological:     Mental Status: She is alert and oriented to person, place, and time.  Psychiatric:        Mood and Affect: Mood normal.        Behavior: Behavior normal.        Judgment: Judgment normal.     There were no vitals taken for this visit. Wt Readings from Last 3 Encounters:  08/03/22 188 lb 11.2 oz (85.6 kg)  06/01/22 185 lb (83.9 kg)  03/16/22 185 lb 3.2 oz (84 kg)    Diabetic Foot Exam - Simple   No data filed    Lab Results  Component Value Date   WBC 8.6 05/26/2022   HGB 12.9 05/26/2022   HCT 39.4 05/26/2022   PLT 199.0 05/26/2022   GLUCOSE 106 (H) 05/26/2022   CHOL 173 05/26/2022   TRIG 81.0 05/26/2022   HDL 68.10 05/26/2022   LDLDIRECT 111.2 02/06/2014   LDLCALC 88 05/26/2022   ALT 11 05/26/2022   AST 13 05/26/2022   NA 137 05/26/2022   K 4.2 05/26/2022   CL 103 05/26/2022   CREATININE 0.69 05/26/2022   BUN 14 05/26/2022   CO2 28 05/26/2022   TSH 1.11 05/26/2022   INR 2.2 09/21/2011  HGBA1C 7.1 (H) 05/26/2022   MICROALBUR 1.3 05/28/2022    Lab Results  Component Value Date   TSH 1.11 05/26/2022   Lab Results  Component Value Date   WBC 8.6 05/26/2022   HGB 12.9 05/26/2022   HCT 39.4 05/26/2022   MCV 89.2 05/26/2022   PLT 199.0 05/26/2022   Lab Results  Component Value Date   NA 137 05/26/2022   K 4.2 05/26/2022   CHLORIDE 110 (H) 10/10/2015   CO2 28 05/26/2022   GLUCOSE 106 (H) 05/26/2022   BUN 14 05/26/2022   CREATININE 0.69 05/26/2022   BILITOT 0.6 05/26/2022   ALKPHOS 68 05/26/2022   AST 13 05/26/2022   ALT 11 05/26/2022   PROT 5.8 (L) 05/26/2022   ALBUMIN 3.8 05/26/2022   CALCIUM 9.2  05/26/2022   ANIONGAP 8 10/10/2015   EGFR 86 (L) 10/10/2015   GFR 82.79 05/26/2022   Lab Results  Component Value Date   CHOL 173 05/26/2022   Lab Results  Component Value Date   HDL 68.10 05/26/2022   Lab Results  Component Value Date   LDLCALC 88 05/26/2022   Lab Results  Component Value Date   TRIG 81.0 05/26/2022   Lab Results  Component Value Date   CHOLHDL 3 05/26/2022   Lab Results  Component Value Date   HGBA1C 7.1 (H) 05/26/2022      Assessment & Plan:  Asthma/Allergies: This is well-controlled with Albuterol inhaler, Azelastine 0.1% nasal spray, Fluticasone nasal spray, and Montelukast 10 mg.  Healthy Lifestyle: Encouraged 6-8 hours of sleep, heart healthy diet, 60-80 oz of non-alcohol/non-caffeinated fluids, and 4000-8000 steps daily.  Hyperlipidemia: This is well-controlled with Rosuvastatin 10 mg.  Immunizations: Encouraged patient to consider annual COVID-19 and Influenza vaccinations. Record of RSV vaccination needed.  Labs: Routine blood work ordered.  Seborrhea Keratoses: Patient will follow with dermatologist.  Type 2 Diabetes Mellitus: Patient continues to monitor blood glucose. Problem List Items Addressed This Visit       Respiratory   Moderate persistent asthma without complication     Endocrine   Type 2 diabetes mellitus with obesity (HCC) - Primary     Other   Hyperlipidemia   No orders of the defined types were placed in this encounter.  I, Vicki Dennis, personally preformed the services described in this documentation.  All medical record entries made by the scribe were at my direction and in my presence.  I have reviewed the chart and discharge instructions (if applicable) and agree that the record reflects my personal performance and is accurate and complete. 09/08/2022  I,Mohammed Iqbal,acting as a scribe for Danise Edge, MD.,have documented all relevant documentation on the behalf of Danise Edge, MD,as directed by  Danise Edge, MD while in the presence of Danise Edge, MD.  Vicki Dennis

## 2022-09-09 ENCOUNTER — Encounter: Payer: Self-pay | Admitting: Family Medicine

## 2022-09-09 NOTE — Assessment & Plan Note (Signed)
Has moved down to overweight category, continue to stay active and maintain a heart healthy diet

## 2022-09-09 NOTE — Assessment & Plan Note (Signed)
Stable and doing well although she has struggled with a cough since suffering from a viral URI. She is largely better now but will notify us if her symptoms worsen again

## 2022-09-11 ENCOUNTER — Ambulatory Visit: Payer: Medicare PPO | Admitting: Allergy

## 2022-09-15 ENCOUNTER — Other Ambulatory Visit (INDEPENDENT_AMBULATORY_CARE_PROVIDER_SITE_OTHER): Payer: Medicare PPO

## 2022-09-15 ENCOUNTER — Telehealth: Payer: Self-pay | Admitting: Family Medicine

## 2022-09-15 DIAGNOSIS — E669 Obesity, unspecified: Secondary | ICD-10-CM | POA: Diagnosis not present

## 2022-09-15 DIAGNOSIS — E1169 Type 2 diabetes mellitus with other specified complication: Secondary | ICD-10-CM | POA: Diagnosis not present

## 2022-09-15 LAB — MICROALBUMIN / CREATININE URINE RATIO
Creatinine,U: 26.1 mg/dL
Microalb Creat Ratio: 2.7 mg/g (ref 0.0–30.0)
Microalb, Ur: <0.7 mg/dL (ref 0.0–1.9)

## 2022-09-15 NOTE — Telephone Encounter (Signed)
Pt dropped off paperwork to be filled out for a new job. Placed in pcp's folder up front. She would like a call once it's ready.

## 2022-09-15 NOTE — Telephone Encounter (Signed)
Will have form ready when pt comes for nurse visit

## 2022-09-21 NOTE — Progress Notes (Unsigned)
Pt presents today for Vision and Hearing Screens for Form Completion:   Vision: Corrected/ Uncorrected:  Bilateral: R: L:   Hearing:    Form has been signed, copy given to [place], CMA for scan [employer], RMA

## 2022-09-22 ENCOUNTER — Ambulatory Visit (INDEPENDENT_AMBULATORY_CARE_PROVIDER_SITE_OTHER): Payer: Medicare PPO

## 2022-09-22 DIAGNOSIS — Z0100 Encounter for examination of eyes and vision without abnormal findings: Secondary | ICD-10-CM | POA: Diagnosis not present

## 2022-09-22 DIAGNOSIS — Z021 Encounter for pre-employment examination: Secondary | ICD-10-CM

## 2022-09-22 DIAGNOSIS — Z111 Encounter for screening for respiratory tuberculosis: Secondary | ICD-10-CM

## 2022-09-22 DIAGNOSIS — Z0110 Encounter for examination of ears and hearing without abnormal findings: Secondary | ICD-10-CM

## 2022-09-24 ENCOUNTER — Ambulatory Visit: Payer: Medicare PPO

## 2022-09-24 LAB — TB SKIN TEST: TB Skin Test: NEGATIVE

## 2022-09-24 NOTE — Progress Notes (Signed)
PPD Reading Note  PPD read and results entered in [employer].  Result: 0 mm induration.  Interpretation: Negative  If test not read within 48-72 hours of initial placement, patient advised to repeat in other arm 1-3 weeks after this test.  Allergic reaction: no

## 2022-09-28 ENCOUNTER — Ambulatory Visit: Payer: Medicare PPO | Admitting: Orthopedic Surgery

## 2022-09-28 ENCOUNTER — Encounter: Payer: Self-pay | Admitting: Orthopedic Surgery

## 2022-09-28 DIAGNOSIS — M170 Bilateral primary osteoarthritis of knee: Secondary | ICD-10-CM

## 2022-09-28 DIAGNOSIS — M1712 Unilateral primary osteoarthritis, left knee: Secondary | ICD-10-CM

## 2022-09-28 DIAGNOSIS — M1711 Unilateral primary osteoarthritis, right knee: Secondary | ICD-10-CM

## 2022-09-28 MED ORDER — LIDOCAINE HCL 1 % IJ SOLN
5.0000 mL | INTRAMUSCULAR | Status: AC | PRN
  Administered 2022-09-28: 5 mL

## 2022-09-28 MED ORDER — HYALURONAN 88 MG/4ML IX SOSY
88.0000 mg | PREFILLED_SYRINGE | INTRA_ARTICULAR | Status: AC | PRN
  Administered 2022-09-28: 88 mg via INTRA_ARTICULAR

## 2022-09-28 NOTE — Progress Notes (Signed)
Procedure Note  Patient: Vicki Dennis             Date of Birth: 1945/06/25           MRN: [id number]             Visit Date: 09/28/2022  Procedures: Visit Diagnoses:  1. Unilateral primary osteoarthritis, right knee   2. Arthritis of left knee     Large Joint Inj: R knee on 09/28/2022 10:09 PM Indications: pain, joint swelling and diagnostic evaluation Details: 18 G 1.5 in needle, superolateral approach  Arthrogram: No  Medications: 5 mL lidocaine 1 %; 88 mg Hyaluronan 88 MG/4ML Outcome: tolerated well, no immediate complications Procedure, treatment alternatives, risks and benefits explained, specific risks discussed. Consent was given by the patient. Immediately prior to procedure a time out was called to verify the correct patient, procedure, equipment, support staff and site/side marked as required. Patient was prepped and draped in the usual sterile fashion.    Large Joint Inj: L knee on 09/28/2022 10:09 PM Indications: pain, joint swelling and diagnostic evaluation Details: 18 G 1.5 in needle, superolateral approach  Arthrogram: No  Medications: 5 mL lidocaine 1 %; 88 mg Hyaluronan 88 MG/4ML Outcome: tolerated well, no immediate complications Procedure, treatment alternatives, risks and benefits explained, specific risks discussed. Consent was given by the patient. Immediately prior to procedure a time out was called to verify the correct patient, procedure, equipment, support staff and site/side marked as required. Patient was prepped and draped in the usual sterile fashion.

## 2022-10-02 ENCOUNTER — Other Ambulatory Visit (HOSPITAL_BASED_OUTPATIENT_CLINIC_OR_DEPARTMENT_OTHER): Payer: Self-pay

## 2022-10-05 ENCOUNTER — Other Ambulatory Visit (HOSPITAL_BASED_OUTPATIENT_CLINIC_OR_DEPARTMENT_OTHER): Payer: Self-pay

## 2022-10-12 ENCOUNTER — Ambulatory Visit: Payer: Medicare PPO | Admitting: Orthopedic Surgery

## 2022-11-02 ENCOUNTER — Telehealth: Payer: Self-pay | Admitting: Orthopedic Surgery

## 2022-11-02 NOTE — Telephone Encounter (Signed)
This is a Dr. August Saucer patient.  Can you call to reschedule their appointment? Thank you.

## 2022-11-02 NOTE — Telephone Encounter (Signed)
Patient called to reschedule her appt. °

## 2022-11-04 ENCOUNTER — Other Ambulatory Visit: Payer: Self-pay | Admitting: Family Medicine

## 2022-11-04 ENCOUNTER — Encounter: Payer: Self-pay | Admitting: Family

## 2022-11-04 ENCOUNTER — Other Ambulatory Visit (HOSPITAL_BASED_OUTPATIENT_CLINIC_OR_DEPARTMENT_OTHER): Payer: Self-pay

## 2022-11-04 ENCOUNTER — Ambulatory Visit: Payer: Medicare PPO | Admitting: Family

## 2022-11-04 ENCOUNTER — Ambulatory Visit (HOSPITAL_BASED_OUTPATIENT_CLINIC_OR_DEPARTMENT_OTHER)
Admission: RE | Admit: 2022-11-04 | Discharge: 2022-11-04 | Disposition: A | Discharge status: Home / Self Care | Payer: No Typology Code available for payment source | Source: Ambulatory Visit | Attending: Family | Admitting: Family

## 2022-11-04 VITALS — BP 155/64 | HR 66 | Ht 66.0 in | Wt 184.6 lb

## 2022-11-04 DIAGNOSIS — S0592XA Unspecified injury of left eye and orbit, initial encounter: Secondary | ICD-10-CM | POA: Insufficient documentation

## 2022-11-04 DIAGNOSIS — H5712 Ocular pain, left eye: Secondary | ICD-10-CM | POA: Diagnosis not present

## 2022-11-04 MED ORDER — OLOPATADINE HCL 0.2 % OP SOLN
OPHTHALMIC | 5 refills | Status: DC
  Filled 2022-11-04: qty 2.5, 25d supply, fill #0

## 2022-11-04 NOTE — Patient Instructions (Signed)
VISIT SUMMARY:  During your visit, we discussed the eye injury you sustained from a fall down wooden steps. You have significant swelling and bruising around the eye, and you're experiencing pain in the area. You also mentioned a minor hand injury from the fall. You have not been able to drive since the incident and are concerned about returning to work due to the injury. We also discussed your need for a refill of your allergy eye drops.  YOUR PLAN:  -FACIAL TRAUMA: This is the injury to your face, specifically around your eye, from your fall. We will order an orbital x-ray to check for any fractures. Continue using cold packs to help reduce the swelling. I am also prescribing Meloxicam, a medication to help manage your pain.  -WORK EXCUSE: You've requested a work excuse due to your inability to drive and perform duties because of your facial trauma. I will provide a work excuse for you from November 04, 2022, to November 11, 2022.  -ALLERGIES: You've requested a refill of your allergy eye drops. I will refill this prescription as requested.  INSTRUCTIONS:  Please schedule an appointment for an orbital x-ray as soon as possible. Continue using cold packs on your eye to help with the swelling. Start taking the prescribed Meloxicam for pain as directed. Remember to pick up your refill of allergy eye drops from the pharmacy. Lastly, please rest and avoid any strenuous activities until you've recovered.

## 2022-11-04 NOTE — Progress Notes (Signed)
Subjective:     Patient ID: Vicki Dennis, female    DOB: 04/16/1945, 77 y.o.   MRN: [id number]  Chief Complaint  Patient presents with   Follow-up    Pt had a fall down front steps onset 11/03/2022 24hrs Would like to make sure no bones are broken in the face Would like Meloxicam     HPI  Discussed the use of AI scribe software for clinical note transcription with the patient, who gave verbal consent to proceed.  History of Present Illness   The patient presents with an eye injury sustained over 24 hours ago after falling down wooden steps due to being startled by a frog. The patient describes the injury as a result of her eye 'dragging down the steps' during the fall. She reports significant swelling and bruising around the eye, which has not improved despite the application of cold packs. The patient also reports pain in the area of the injury. She denies any loss of vision or lumps in the head but reports tenderness around the injured area. The patient also has a minor hand injury from the fall. She has not been able to drive since the incident and is concerned about returning to work due to the injury.      The patient presents with an eye injury sustained over 24 hours ago after falling down wooden steps due to being startled by a frog. The patient describes the injury as a result of her eye 'dragging down the steps' during the fall. She reports significant swelling and bruising around the eye, which has not improved despite the application of cold packs. The patient also reports pain in the area of the injury. She denies any loss of vision, but reports tenderness around the injured area. The patient also has a minor hand injury from the fall. She has not been able to drive since the incident and is concerned about returning to work due to the injury.    Health Maintenance Due  Topic Date Due   FOOT EXAM  06/15/2017   Medicare Annual Wellness (AWV)  06/15/2017   COVID-19 Vaccine  (6 - 2023-24 season) 05/28/2022    Past Medical History:  Diagnosis Date   Allergy    Anemia    h/o   Asthma    Asthma, mild intermittent, well-controlled 02/10/2014   Blood transfusion without reported diagnosis    Chicken pox as a child   Diabetes mellitus    type-II, per Dr. Parke Simmers, no meds. , pt. reports weight loss has contributed  to control of diabetes.  She reports last HgbA1c- wnl.   Diabetes mellitus 11/05/2010   Diabetes mellitus type 2 in obese 11/05/2010   Diabetes mellitus type 2, uncontrolled 11/05/2010   Esophageal reflux 02/10/2014   GERD (gastroesophageal reflux disease)    rare use of OTC aid   Hyperglycemia 09/02/2015   Hyperlipidemia    Hypertension    Measles as a child   Measles    Medicare annual wellness visit, subsequent 08/06/2014   Mumps as a child   Obesity 02/10/2014   Pneumonia    as a child   Portal vein thrombosis 03/09/2011   coumadin ended summer of 2013   Preventative health care 08/12/2014   Rash and nonspecific skin eruption 01/07/2015   Seborrheic keratosis 02/10/2014   Follows with dermatology   Sinusitis, acute 03/23/2016    Past Surgical History:  Procedure Laterality Date   BREAST BIOPSY Bilateral    INSERTION  OF MESH  03/16/2012   Procedure: INSERTION OF MESH;  Surgeon: Shelly Rubenstein, MD;  Location: [facility] OR;  Service: General;  Laterality: N/A;   KNEE ARTHROSCOPY W/ MENISCAL REPAIR     both knees   TONSILLECTOMY     UMBILICAL HERNIA REPAIR  03/16/2012   Procedure: HERNIA REPAIR UMBILICAL ADULT;  Surgeon: Shelly Rubenstein, MD;  Location: [facility] OR;  Service: General;  Laterality: N/A;    Family History  Problem Relation Age of Onset   Cancer Father        prostate   Aneurysm Mother 25       brain   Cancer Maternal Aunt     Social History   Socioeconomic History   Marital status: Divorced    Spouse name: Not on file   Number of children: Not on file   Years of education: Not on file   Highest education level: Not on  file  Occupational History   Not on file  Tobacco Use   Smoking status: Never   Smokeless tobacco: Never   Tobacco comments:    never used tobacco  Vaping Use   Vaping status: Never Used  Substance and Sexual Activity   Alcohol use: Yes    Alcohol/week: 0.0 standard drinks of alcohol    Comment: for holidays   Drug use: No   Sexual activity: Not Currently    Comment: lives alone   Other Topics Concern   Not on file  Social History Narrative   Retired Runner, broadcasting/film/video   Social Determinants of Corporate investment banker Strain: Not on file  Food Insecurity: Not on file  Transportation Needs: Not on file  Physical Activity: Not on file  Stress: Not on file  Social Connections: Not on file  Intimate Partner Violence: Not on file    Outpatient Medications Prior to Visit  Medication Sig Dispense Refill   ACCU-CHEK AVIVA PLUS test strip Use to check blood sugar once daily as directed 100 each 3   albuterol (PROVENTIL HFA) 108 (90 Base) MCG/ACT inhaler Inhale 2 puffs into the lungs every 4 (four) hours as needed for wheezing or shortness of breath. 18 g 1   azelastine (ASTELIN) 0.1 % nasal spray Place 1 to 2 sprays in each nostril 1-2 times a day as needed for runny nose/drainage down throat 30 mL 5   blood glucose meter kit and supplies KIT Dispense based on patient and insurance preference. Use up to four times daily as directed. (FOR ICD-9 250.00, 250.01). 1 each 0   EPINEPHrine 0.3 MG/0.3ML SOSY Use as directed for severe allergic reactions 2 each 1   fluticasone (FLONASE) 50 MCG/ACT nasal spray Place 2 sprays in each nostril once a day as needed for stuffy nose 16 g 5   fluticasone-salmeterol (ADVAIR HFA) 115-21 MCG/ACT inhaler Inhale 2 puffs into the lungs 2 (two) times daily. 1 each 5   Lancets 30G MISC Use as directed once daily to check blood sugar.  Diagnosis code E11.9 100 each 5   montelukast (SINGULAIR) 10 MG tablet Take 1 tablet (10 mg total) by mouth at bedtime. 90 tablet 5    Multiple Vitamins-Minerals (MULTIVITAMIN WITH MINERALS) tablet Take 1 tablet by mouth daily.     Omega-3 Fatty Acids (FISH OIL CONCENTRATE) 1000 MG CAPS Take by mouth 3 (three) times daily.     rosuvastatin (CRESTOR) 10 MG tablet Take 1 tablet (10 mg total) by mouth daily. 90 tablet 3   enalapril (VASOTEC) 2.5  MG tablet TAKE ONE TABLET BY MOUTH ONE TIME DAILY 90 tablet 1   Olopatadine HCl 0.2 % SOLN Place 1 drop in each eye once a day as needed for itchy watery eyes 2.5 mL 5   diphenoxylate-atropine (LOMOTIL) 2.5-0.025 MG tablet Take 1 tablet by mouth 4 (four) times daily as needed for diarrhea or loose stools. (Patient not taking: Reported on 11/04/2022) 30 tablet 0   meloxicam (MOBIC) 7.5 MG tablet Take 1-2 tablets (7.5-15 mg total) by mouth daily as needed for pain. (Patient not taking: Reported on 11/04/2022) 60 tablet 3   No facility-administered medications prior to visit.    Allergies  Allergen Reactions   Codeine Other (See Comments)    "makes me crazy"   Hornet Venom Anaphylaxis   Sulfa Antibiotics Anaphylaxis    "died 3 times".   Doxycycline Other (See Comments)    "Yeast infection"   Oxycodone    Amoxicillin Rash    ROS     Objective:    Physical Exam HENT:     Head: Normocephalic.  Eyes:     Comments: Swelling ane ecchymosis of left upper eyelid and some swelling on the lower.  Surrounding swelling and tenderness of orbit on the left  Pulmonary:     Effort: Pulmonary effort is normal.  Neurological:     Mental Status: She is alert and oriented to person, place, and time.  Psychiatric:        Attention and Perception: Attention normal.        Speech: Speech is rapid and pressured.        Behavior: Behavior is cooperative.        Thought Content: Thought content normal.        Cognition and Memory: Cognition normal.        Judgment: Judgment normal.      BP (!) 155/64 (BP Location: Right Arm, Patient Position: Sitting, Cuff Size: Normal)   Pulse 66   Ht  5\' 6"  (1.676 m)   Wt 184 lb 9.6 oz (83.7 kg)   SpO2 100%   BMI 29.80 kg/m  Wt Readings from Last 3 Encounters:  11/04/22 184 lb 9.6 oz (83.7 kg)  09/08/22 185 lb 9.6 oz (84.2 kg)  08/03/22 188 lb 11.2 oz (85.6 kg)       Assessment & Plan:   Problem List Items Addressed This Visit       Unprioritized   Left eye injury - Primary    Facial Trauma: Patient fell down wooden steps after being startled by a frog, resulting in significant bruising and swelling around the left eye. No visual deficits. Tenderness on palpation of the left orbital area. -Order orbital x-ray to rule out fracture. -Continue cold packs for swelling. -Prescribe Meloxicam for pain.  Work Excuse: Patient requests work excuse due to inability to drive and perform duties due to facial trauma. -Provide work excuse from 11/04/2022 to 11/11/2022.      Relevant Orders   DG Orbits (Completed)    I am having Vicki Dennis. Alvira maintain her multivitamin with minerals, Fish Oil Concentrate, Lancets 30G, blood glucose meter kit and supplies, montelukast, Accu-Chek Aviva Plus, diphenoxylate-atropine, rosuvastatin, meloxicam, fluticasone-salmeterol, EPINEPHrine, fluticasone, albuterol, azelastine, and Olopatadine HCl.  Meds ordered this encounter  Medications   Olopatadine HCl 0.2 % SOLN    Sig: Place 1 drop in each eye once a day as needed for itchy watery eyes    Dispense:  2.5 mL    Refill:  5  Order Specific Question:   Supervising Provider    Answer:   Danise Edge A [[id number]]

## 2022-11-04 NOTE — Assessment & Plan Note (Signed)
Facial Trauma: Patient fell down wooden steps after being startled by a frog, resulting in significant bruising and swelling around the left eye. No visual deficits. Tenderness on palpation of the left orbital area. -Order orbital x-ray to rule out fracture. -Continue cold packs for swelling. -Prescribe Meloxicam for pain.  Work Excuse: Patient requests work excuse due to inability to drive and perform duties due to facial trauma. -Provide work excuse from 11/04/2022 to 11/11/2022.

## 2022-11-12 DIAGNOSIS — I4430 Unspecified atrioventricular block: Secondary | ICD-10-CM | POA: Diagnosis not present

## 2022-11-12 DIAGNOSIS — I2510 Atherosclerotic heart disease of native coronary artery without angina pectoris: Secondary | ICD-10-CM | POA: Diagnosis not present

## 2022-11-12 DIAGNOSIS — I442 Atrioventricular block, complete: Secondary | ICD-10-CM | POA: Diagnosis not present

## 2022-11-12 DIAGNOSIS — E119 Type 2 diabetes mellitus without complications: Secondary | ICD-10-CM | POA: Diagnosis not present

## 2022-11-12 DIAGNOSIS — R531 Weakness: Secondary | ICD-10-CM | POA: Diagnosis not present

## 2022-11-12 DIAGNOSIS — I350 Nonrheumatic aortic (valve) stenosis: Secondary | ICD-10-CM | POA: Diagnosis not present

## 2022-11-12 DIAGNOSIS — Z950 Presence of cardiac pacemaker: Secondary | ICD-10-CM | POA: Diagnosis not present

## 2022-11-12 DIAGNOSIS — J45909 Unspecified asthma, uncomplicated: Secondary | ICD-10-CM | POA: Diagnosis not present

## 2022-11-12 DIAGNOSIS — E782 Mixed hyperlipidemia: Secondary | ICD-10-CM | POA: Diagnosis not present

## 2022-11-12 DIAGNOSIS — Z880 Allergy status to penicillin: Secondary | ICD-10-CM | POA: Diagnosis not present

## 2022-11-12 DIAGNOSIS — I441 Atrioventricular block, second degree: Secondary | ICD-10-CM | POA: Diagnosis not present

## 2022-11-12 DIAGNOSIS — I359 Nonrheumatic aortic valve disorder, unspecified: Secondary | ICD-10-CM | POA: Diagnosis not present

## 2022-11-12 DIAGNOSIS — R069 Unspecified abnormalities of breathing: Secondary | ICD-10-CM | POA: Diagnosis not present

## 2022-11-12 DIAGNOSIS — R001 Bradycardia, unspecified: Secondary | ICD-10-CM | POA: Diagnosis not present

## 2022-11-12 DIAGNOSIS — Z881 Allergy status to other antibiotic agents status: Secondary | ICD-10-CM | POA: Diagnosis not present

## 2022-11-12 DIAGNOSIS — I10 Essential (primary) hypertension: Secondary | ICD-10-CM | POA: Diagnosis not present

## 2022-11-17 ENCOUNTER — Other Ambulatory Visit (HOSPITAL_BASED_OUTPATIENT_CLINIC_OR_DEPARTMENT_OTHER): Payer: Self-pay

## 2022-11-17 ENCOUNTER — Ambulatory Visit: Payer: Medicare PPO | Admitting: Family Medicine

## 2022-11-17 ENCOUNTER — Telehealth: Payer: Self-pay | Admitting: *Deleted

## 2022-11-17 ENCOUNTER — Other Ambulatory Visit: Payer: Self-pay | Admitting: Family Medicine

## 2022-11-17 ENCOUNTER — Encounter: Payer: Self-pay | Admitting: *Deleted

## 2022-11-17 VITALS — BP 128/68 | HR 72 | Temp 98.4°F | Resp 16 | Ht 66.0 in | Wt 184.0 lb

## 2022-11-17 DIAGNOSIS — J4520 Mild intermittent asthma, uncomplicated: Secondary | ICD-10-CM | POA: Diagnosis not present

## 2022-11-17 DIAGNOSIS — Z6829 Body mass index (BMI) 29.0-29.9, adult: Secondary | ICD-10-CM

## 2022-11-17 DIAGNOSIS — E782 Mixed hyperlipidemia: Secondary | ICD-10-CM | POA: Diagnosis not present

## 2022-11-17 DIAGNOSIS — E1169 Type 2 diabetes mellitus with other specified complication: Secondary | ICD-10-CM

## 2022-11-17 DIAGNOSIS — E669 Obesity, unspecified: Secondary | ICD-10-CM | POA: Diagnosis not present

## 2022-11-17 DIAGNOSIS — L239 Allergic contact dermatitis, unspecified cause: Secondary | ICD-10-CM | POA: Diagnosis not present

## 2022-11-17 DIAGNOSIS — I459 Conduction disorder, unspecified: Secondary | ICD-10-CM

## 2022-11-17 MED ORDER — CEPHALEXIN 500 MG PO CAPS
500.0000 mg | ORAL_CAPSULE | Freq: Three times a day (TID) | ORAL | 0 refills | Status: DC
  Filled 2022-11-17: qty 21, 7d supply, fill #0

## 2022-11-17 NOTE — Patient Instructions (Signed)
Second-Degree Atrioventricular Block Second-degree atrioventricular (AV) block is a condition that causes the electrical signals that travel from the heart's upper chambers (atria) to the lower chambers (ventricles) to move too slowly or be interrupted. This can cause the heart to miss beats. There are two types of second-degree AV block: Mobitz type 1 block. This type does not cause symptoms. In most cases, it does not require treatment. Mobitz type 2 block. This type is more serious. It causes a very irregular heartbeat and can lead to fainting or cardiac arrest. It can also lead to third-degree AV block and signal stopping between the atria and ventricles. If you have this type, you may need a pacemaker. What are the causes? A second-degree AV block may be caused by: A heart attack or any other condition that affects your heart's rate and rhythm. Too much stimulation of the vagus nerve. This is the nerve that slows down your heart rate. You may be more likely to have this if you play sports. Medicines that slow down your heart rate, such as beta blockers or calcium channel blockers. Surgery that harms the heart. In some cases, you may be born with this condition (congenital heart block). In most cases, people get it over time. What increases the risk? The risk for this condition goes up with age. You are also more likely to get second-degree AV block if: You have had a heart attack before. You have a heart problem, such as: Heart failure. Coronary artery disease. Myocarditis. This is inflammation of the heart muscle. Cardiomyopathy. This is a disease of the heart muscle. Endocarditis. This is an infection of the heart valves. You get an infection or disease that affects your heart. These include: Lyme disease. Sarcoidosis. Hemochromatosis. Rheumatic fever. Certain muscle disorders. You have thyroid conditions like hyperthyroidism or hypothyroidism. Children are more likely to be born  with or develop heart block if: Their mother has an autoimmune disease, such as lupus. An autoimmune disease is when the body's defense system (immune system) attacks and damages healthy tissues and organs. They are born with a heart problem that affects the structure of their heart (heart defect). They have a parent who was born with a heart defect. What are the signs or symptoms? Symptoms of this condition include: Tiredness. Shortness of breath. Dizziness. Light-headedness. Fainting. Chest pain. How is this diagnosed? This condition may be diagnosed based on a physical exam and your medical history. Your pulse or heart rate may be measured. You may also have tests done. These may include: An electrocardiogram (EKG). This checks for problems with electrical activity in your heart. Ambulatory cardiac monitoring. This is an EKG that you can carry around and wear. It checks your heart's rhythm. An electrophysiology (EP) study. Soft tubes (catheters) are placed in your heart to check your heart's electrical signals. How is this treated? Treatment depends on the type of block you have and how severe it is. You may need to: Get treatment for a condition that is causing the block. Change or stop any heart medicines that can cause heart block. Have a pacemaker placed in your chest. A pacemaker uses electrical pulses to help your heart beat like it should. It is often placed under the skin on your chest. Follow these instructions at home: Alcohol use Do not drink alcohol if: Your health care provider tells you not to drink. You are pregnant, may be pregnant, or plan to become pregnant. If you drink alcohol: Limit how much you have to:  0-1 drink a day if you are female. 0-2 drinks a day if you are female. Know how much alcohol is in your drink. In the [place] one drink is one 12 oz bottle of beer (355 mL), one 5 oz glass of wine (148 mL), or one 1 oz glass of hard liquor (44 mL). Lifestyle  Do  not use any products that contain nicotine or tobacco. These products include cigarettes, chewing tobacco, and vaping devices, such as e-cigarettes. If you need help quitting, ask your provider. Work to reduce your risk of heart disease as told by your provider. You may be told to: Exercise at least 30 minutes on 5 or more days each week (150 minutes). Ask your provider what type of exercise is safe for you. Eat a diet that is good for your heart. This may include fruits and vegetables, whole grains, low-fat dairy products, and lean proteins like poultry and eggs. Talk to your provider or a dietician about how to make healthy choices. Stay at a healthy weight. General instructions Take over-the-counter and prescription medicines only as told by your provider. Keep all follow-up visits. Your provider will need to watch your heart rate and heart function. Where to find more information American Heart Association (AHA): heart.org National Heart, Lung, and Blood Institute (NHLBI): [employer].gl Contact a health care provider if: Your symptoms change or get worse. You get new symptoms. You feel like your heart is skipping beats. You feel more tired than normal. You feel light-headed. Your hands, feet, or lower legs swell. Get help right away if: You have chest pain that: Feels like crushing or pressure. Spreads to your arms, back, neck, or jaw. You feel short of breath. You pass out. These symptoms may be an emergency. Get help right away. Call 911. Do not wait to see if the symptoms will go away. Do not drive yourself to the hospital. This information is not intended to replace advice given to you by your health care provider. Make sure you discuss any questions you have with your health care provider. Document Revised: 01/28/2022 Document Reviewed: 01/28/2022 Elsevier Patient Education  2024 [employer].

## 2022-11-17 NOTE — Assessment & Plan Note (Addendum)
Was recently hospitalized for pacemaker placement after complete heart block and she has had pruritic rashes on right arm and chest from tape and EKG leads. Try adding some Loratadine 10 mg twice daily and Famotidine 10 mg twice daily for 1-2 weeks keep lesions clean and dry. If any increase in lesion size or any discharge or increase pain will give an antibiotic to take. Keflex 500 mg po tid

## 2022-11-17 NOTE — Transitions of Care (Post Inpatient/ED Visit) (Signed)
11/17/2022  Name: Vicki Dennis MRN: [id number] DOB: [birth year]  Today's TOC FU Call Status: Today's TOC FU Call Status:: Unsuccessful Call (1st Attempt) Unsuccessful Call (1st Attempt) Date: 11/17/22  Attempted to reach the patient regarding the most recent Inpatient visit; Received automated outgoing voice message that patient has a voice mail box that is full-- unable to leave voice message requesting call back   Follow Up Plan: Additional outreach attempts will be made to reach the patient to complete the Transitions of Care (Post Inpatient visit) call.   Caryl Pina, RN, BSN, CCRN Alumnus RN CM Care Coordination/ Transition of Care- [facility] Care Management [phone]: direct office

## 2022-11-18 ENCOUNTER — Encounter: Payer: Self-pay | Admitting: *Deleted

## 2022-11-18 ENCOUNTER — Telehealth: Payer: Self-pay | Admitting: *Deleted

## 2022-11-18 ENCOUNTER — Other Ambulatory Visit (HOSPITAL_BASED_OUTPATIENT_CLINIC_OR_DEPARTMENT_OTHER): Payer: Self-pay

## 2022-11-18 NOTE — Transitions of Care (Post Inpatient/ED Visit) (Signed)
11/18/2022  Name: Vicki Dennis MRN: [id number] DOB: November 19, 1945  Today's TOC FU Call Status: Today's TOC FU Call Status:: Successful TOC FU Call Completed TOC FU Call Complete Date: 11/18/22  Transition Care Management Follow-up Telephone Call Date of Discharge: 11/14/22 Discharge Facility: Other ([facility]) Name of Other ([facility]) Discharge Facility: [facility] Type of Discharge: Inpatient Admission Primary Inpatient Discharge Diagnosis:: Complete Heart Block with pacemaker insertion How have you been since you were released from the hospital?: Better ("I am doing fine; I am back to my normal activities, just trying to rest up and recuperate after the hospital visit") Any questions or concerns?: No  Items Reviewed: Did you receive and understand the discharge instructions provided?: Yes (briefly reviewed with patient who verbalizes good understanding of same - outside hospital AVS) Medications obtained,verified, and reconciled?: Yes (Medications Reviewed) (Full medication reconciliation/ review completed; no concerns or discrepancies identified; confirmed patient obtained/ is taking all newly Rx'd medications as instructed; self-manages medications and denies questions/ concerns around medications today) Any new allergies since your discharge?: No Dietary orders reviewed?: Yes Type of Diet Ordered:: "As healthy as I can" Do you have support at home?: Yes People in Home: alone Name of Support/Comfort Primary Source: Reports resides alone/ independent in self-care activities; supportive neighbors assists as/ if needed/ indicated  Medications Reviewed Today: Medications Reviewed Today     Reviewed by Michaela Corner, RN (Registered Nurse) on 11/18/22 at 1032  Med List Status: <None>   Medication Order Taking? Sig Documenting Provider Last Dose Status Informant  ACCU-CHEK AVIVA PLUS test strip [id number]  Use to check blood sugar once daily as directed Bradd Canary, MD  Active    albuterol (PROVENTIL HFA) 108 (90 Base) MCG/ACT inhaler [id number]  Inhale 2 puffs into the lungs every 4 (four) hours as needed for wheezing or shortness of breath. Nehemiah Settle, FNP  Active   azelastine (ASTELIN) 0.1 % nasal spray [id number]  Place 1 to 2 sprays in each nostril 1-2 times a day as needed for runny nose/drainage down throat Nehemiah Settle, FNP  Active   blood glucose meter kit and supplies KIT [id number]  Dispense based on patient and insurance preference. Use up to four times daily as directed. (FOR ICD-9 250.00, 250.01). Bradd Canary, MD  Active   cephALEXin (KEFLEX) 500 MG capsule [id number]  Take 1 capsule (500 mg total) by mouth 3 (three) times daily. Bradd Canary, MD  Active   diphenoxylate-atropine (LOMOTIL) 2.5-0.025 MG tablet [id number]  Take 1 tablet by mouth 4 (four) times daily as needed for diarrhea or loose stools.  Patient not taking: Reported on 11/04/2022   Bradd Canary, MD  Active   enalapril (VASOTEC) 2.5 MG tablet [id number]  Take 1 tablet (2.5 mg total) by mouth daily. Bradd Canary, MD  Active   EPINEPHrine 0.3 MG/0.3ML Konrad Penta [id number]  Use as directed for severe allergic reactions Nehemiah Settle, FNP  Active   fluticasone [facility]) 50 MCG/ACT nasal spray [id number]  Place 2 sprays in each nostril once a day as needed for stuffy nose Nehemiah Settle, FNP  Active   fluticasone-salmeterol (ADVAIR [facility]) 301-60 MCG/ACT inhaler [id number]  Inhale 2 puffs into the lungs 2 (two) times daily. Nehemiah Settle, FNP  Active   Lancets 30G MISC [id number]  Use as directed once daily to check blood sugar.  Diagnosis code E11.9 Bradd Canary, MD  Active   meloxicam (MOBIC) 7.5 MG tablet [id number]  Take 1-2 tablets (7.5-15 mg total) by  mouth daily as needed for pain.  Patient not taking: Reported on 11/04/2022   Zola Button, Grayling Congress, DO  Active   montelukast (SINGULAIR) 10 MG tablet [id number]  Take 1 tablet (10 mg total) by mouth at bedtime. Hetty Blend, FNP  Active    Multiple Vitamins-Minerals (MULTIVITAMIN WITH MINERALS) tablet [id number]  Take 1 tablet by mouth daily. [provider]  Active   Olopatadine HCl 0.2 % SOLN [id number]  Place 1 drop in each eye once a day as needed for itchy watery eyes [name], NP  Active   Omega-3 Fatty Acids (FISH OIL CONCENTRATE) 1000 MG CAPS [id number]  Take by mouth 3 (three) times daily. [provider]  Active   rosuvastatin (CRESTOR) 10 MG tablet [id number]  Take 1 tablet (10 mg total) by mouth daily. Bradd Canary, MD  Active            Home Care and Equipment/Supplies: Were Home Health Services Ordered?: No Any new equipment or medical supplies ordered?: No  Functional Questionnaire: Do you need assistance with bathing/showering or dressing?: No Do you need assistance with meal preparation?: No Do you need assistance with eating?: No Do you have difficulty maintaining continence: No Do you need assistance with getting out of bed/getting out of a chair/moving?: No Do you have difficulty managing or taking your medications?: No  Follow up appointments reviewed: PCP Follow-up appointment confirmed?: Yes Date of PCP follow-up appointment?: 11/17/22 Follow-up Provider: PCP- verified attended as scheduled Specialist Hospital Follow-up appointment confirmed?: Yes Date of Specialist follow-up appointment?: 11/20/22 Follow-Up Specialty Provider:: [facility]- post-pacemaker check Do you need transportation to your follow-up appointment?: No Do you understand care options if your condition(s) worsen?: Yes-patient verbalized understanding  SDOH Interventions Today    Flowsheet Row Most Recent Value  SDOH Interventions   Food Insecurity Interventions Intervention Not Indicated  Transportation Interventions Intervention Not Indicated  [drives self,  neighbor assists as/ if needed]      TOC Interventions Today    Flowsheet Row Most Recent Value  TOC Interventions   TOC  Interventions Discussed/Reviewed TOC Interventions Discussed, Post op wound/incision care  [Patient declines need for ongoing/ further care coordination outreach,  no care coordination needs identified at time of TOC call today,  provided my direct contact information should questions/ concerns/ needs arise post-TOC call]      Interventions Today    Flowsheet Row Most Recent Value  Chronic Disease   Chronic disease during today's visit Other  [complete heart block with pacemaker insertion]  General Interventions   General Interventions Discussed/Reviewed General Interventions Discussed, Doctor Visits, Durable Medical Equipment (DME)  Doctor Visits Discussed/Reviewed PCP, Specialist, Doctor Visits Discussed, Doctor Visits Reviewed  Durable Medical Equipment (DME) Glucomoter  [confirmed not currently requiring/ using assistive devices]  PCP/Specialist Visits Compliance with follow-up visit  Exercise Interventions   Exercise Discussed/Reviewed Exercise Discussed  [cardiac rehabilitation referral placed 11/17/22 by PCP]  Education Interventions   Education Provided Provided Education  Provided Verbal Education On Other  [basics of pacemaker]  Nutrition Interventions   Nutrition Discussed/Reviewed Nutrition Discussed  Pharmacy Interventions   Pharmacy Dicussed/Reviewed Pharmacy Topics Discussed  [Full medication review with updating medication list in EHR per patient report]      Caryl Pina, RN, BSN, CCRN Alumnus RN CM Care Coordination/ Transition of Care- [facility] Care Management [phone]: direct office

## 2022-11-19 ENCOUNTER — Encounter: Payer: Self-pay | Admitting: Family Medicine

## 2022-11-19 ENCOUNTER — Encounter (HOSPITAL_COMMUNITY): Payer: Self-pay

## 2022-11-19 DIAGNOSIS — I459 Conduction disorder, unspecified: Secondary | ICD-10-CM | POA: Insufficient documentation

## 2022-11-19 NOTE — Progress Notes (Signed)
Subjective:    Patient ID: Vicki Dennis, female    DOB: 08/09/1945, 77 y.o.   MRN: [id number]  Chief Complaint  Patient presents with   Follow-up    Follow up    HPI Discussed the use of AI scribe software for clinical note transcription with the patient, who gave verbal consent to proceed.  History of Present Illness   The patient, with a recent history of complete heart block and subsequent pacemaker placement, presents with concerns about an allergic reaction to the adhesive used post-surgery. The patient reports skin irritation and itching around the surgical area, which they have been managing with Betadine. The patient also notes a small area of serous drainage, which has been persistent for the past three days. The patient has been experiencing sleep disturbances due to frequent hospital interruptions and reports feeling off-balance with their sleep schedule. she is feeling weaker than usual     The episode occurred at work and she will need a few more weeks off of work to recover. Currently Denies CP/palp/SOB/HA/congestion/fevers/GI or GU c/o. Taking meds as prescribed    Past Medical History:  Diagnosis Date   Allergy    Anemia    h/o   Asthma    Asthma, mild intermittent, well-controlled 02/10/2014   Blood transfusion without reported diagnosis    Chicken pox as a child   Diabetes mellitus    type-II, per Dr. Parke Simmers, no meds. , pt. reports weight loss has contributed  to control of diabetes.  She reports last HgbA1c- wnl.   Diabetes mellitus 11/05/2010   Diabetes mellitus type 2 in obese 11/05/2010   Diabetes mellitus type 2, uncontrolled 11/05/2010   Esophageal reflux 02/10/2014   GERD (gastroesophageal reflux disease)    rare use of OTC aid   Hyperglycemia 09/02/2015   Hyperlipidemia    Hypertension    Measles as a child   Measles    Medicare annual wellness visit, subsequent 08/06/2014   Mumps as a child   Obesity 02/10/2014   Pneumonia    as a child   Portal  vein thrombosis 03/09/2011   coumadin ended summer of 2013   Preventative health care 08/12/2014   Rash and nonspecific skin eruption 01/07/2015   Seborrheic keratosis 02/10/2014   Follows with dermatology   Sinusitis, acute 03/23/2016    Past Surgical History:  Procedure Laterality Date   BREAST BIOPSY Bilateral    INSERTION OF MESH  03/16/2012   Procedure: INSERTION OF MESH;  Surgeon: Shelly Rubenstein, MD;  Location: [facility] OR;  Service: General;  Laterality: N/A;   KNEE ARTHROSCOPY W/ MENISCAL REPAIR     both knees   TONSILLECTOMY     UMBILICAL HERNIA REPAIR  03/16/2012   Procedure: HERNIA REPAIR UMBILICAL ADULT;  Surgeon: Shelly Rubenstein, MD;  Location: [facility] OR;  Service: General;  Laterality: N/A;    Family History  Problem Relation Age of Onset   Cancer Father        prostate   Aneurysm Mother 14       brain   Cancer Maternal Aunt     Social History   Socioeconomic History   Marital status: Divorced    Spouse name: Not on file   Number of children: Not on file   Years of education: Not on file   Highest education level: Not on file  Occupational History   Not on file  Tobacco Use   Smoking status: Never   Smokeless tobacco: Never  Tobacco comments:    never used tobacco  Vaping Use   Vaping status: Never Used  Substance and Sexual Activity   Alcohol use: Yes    Alcohol/week: 0.0 standard drinks of alcohol    Comment: for holidays   Drug use: No   Sexual activity: Not Currently    Comment: lives alone   Other Topics Concern   Not on file  Social History Narrative   Retired Runner, broadcasting/film/video   Social Determinants of Corporate investment banker Strain: Not on file  Food Insecurity: No Food Insecurity (11/18/2022)   Hunger Vital Sign    Worried About Running Out of Food in the Last Year: Never true    Ran Out of Food in the Last Year: Never true  Transportation Needs: No Transportation Needs (11/18/2022)   PRAPARE - Administrator, Civil Service  (Medical): No    Lack of Transportation (Non-Medical): No  Physical Activity: Not on file  Stress: Not on file  Social Connections: Unknown (11/12/2022)   [facility]   Social Network    Social Network: Not on file  Intimate Partner Violence: Not At Risk (11/12/2022)   Received from [facility]   HITS    Over the last 12 months how often did your partner physically hurt you?: 1    Over the last 12 months how often did your partner insult you or talk down to you?: 1    Over the last 12 months how often did your partner threaten you with physical harm?: 1    Over the last 12 months how often did your partner scream or curse at you?: 1    Outpatient Medications Prior to Visit  Medication Sig Dispense Refill   ACCU-CHEK AVIVA PLUS test strip Use to check blood sugar once daily as directed 100 each 3   albuterol (PROVENTIL HFA) 108 (90 Base) MCG/ACT inhaler Inhale 2 puffs into the lungs every 4 (four) hours as needed for wheezing or shortness of breath. 18 g 1   azelastine (ASTELIN) 0.1 % nasal spray Place 1 to 2 sprays in each nostril 1-2 times a day as needed for runny nose/drainage down throat 30 mL 5   blood glucose meter kit and supplies KIT Dispense based on patient and insurance preference. Use up to four times daily as directed. (FOR ICD-9 250.00, 250.01). 1 each 0   diphenoxylate-atropine (LOMOTIL) 2.5-0.025 MG tablet Take 1 tablet by mouth 4 (four) times daily as needed for diarrhea or loose stools. (Patient not taking: Reported on 11/04/2022) 30 tablet 0   enalapril (VASOTEC) 2.5 MG tablet Take 1 tablet (2.5 mg total) by mouth daily. 90 tablet 0   EPINEPHrine 0.3 MG/0.3ML SOSY Use as directed for severe allergic reactions 2 each 1   fluticasone (FLONASE) 50 MCG/ACT nasal spray Place 2 sprays in each nostril once a day as needed for stuffy nose 16 g 5   fluticasone-salmeterol (ADVAIR HFA) 115-21 MCG/ACT inhaler Inhale 2 puffs into the lungs 2 (two) times daily. 1 each 5    Lancets 30G MISC Use as directed once daily to check blood sugar.  Diagnosis code E11.9 100 each 5   meloxicam (MOBIC) 7.5 MG tablet Take 1-2 tablets (7.5-15 mg total) by mouth daily as needed for pain. 60 tablet 3   montelukast (SINGULAIR) 10 MG tablet Take 1 tablet (10 mg total) by mouth at bedtime. 90 tablet 5   Multiple Vitamins-Minerals (MULTIVITAMIN WITH MINERALS) tablet Take 1 tablet  by mouth daily.     Olopatadine HCl 0.2 % SOLN Place 1 drop in each eye once a day as needed for itchy watery eyes 2.5 mL 5   Omega-3 Fatty Acids (FISH OIL CONCENTRATE) 1000 MG CAPS Take by mouth 3 (three) times daily.     rosuvastatin (CRESTOR) 10 MG tablet Take 1 tablet (10 mg total) by mouth daily. 90 tablet 3   No facility-administered medications prior to visit.    Allergies  Allergen Reactions   Codeine Other (See Comments)    "makes me crazy"   Hornet Venom Anaphylaxis   Sulfa Antibiotics Anaphylaxis    "died 3 times".   Doxycycline Other (See Comments)    "Yeast infection"   Oxycodone    Wound Dressing Adhesive    Amoxicillin Rash    Review of Systems  Constitutional:  Negative for fever and malaise/fatigue.  HENT:  Negative for congestion.   Eyes:  Negative for blurred vision.  Respiratory:  Negative for shortness of breath.   Cardiovascular:  Negative for chest pain, palpitations and leg swelling.  Gastrointestinal:  Negative for abdominal pain, blood in stool and nausea.  Genitourinary:  Negative for dysuria and frequency.  Musculoskeletal:  Negative for falls.  Skin:  Negative for rash.  Neurological:  Negative for dizziness, loss of consciousness and headaches.  Endo/Heme/Allergies:  Negative for environmental allergies.  Psychiatric/Behavioral:  Negative for depression. The patient is not nervous/anxious.        Objective:    Physical Exam Constitutional:      General: She is not in acute distress.    Appearance: Normal appearance. She is well-developed. She is not  toxic-appearing.  HENT:     Head: Normocephalic and atraumatic.     Right Ear: External ear normal.     Left Ear: External ear normal.     Nose: Nose normal.  Eyes:     General:        Right eye: No discharge.        Left eye: No discharge.     Conjunctiva/sclera: Conjunctivae normal.  Neck:     Thyroid: No thyromegaly.  Cardiovascular:     Rate and Rhythm: Normal rate and regular rhythm.     Heart sounds: Normal heart sounds. No murmur heard. Pulmonary:     Effort: Pulmonary effort is normal. No respiratory distress.     Breath sounds: Normal breath sounds.  Abdominal:     General: Bowel sounds are normal.     Palpations: Abdomen is soft.     Tenderness: There is no abdominal tenderness. There is no guarding.  Musculoskeletal:        General: Normal range of motion.     Cervical back: Neck supple.  Lymphadenopathy:     Cervical: No cervical adenopathy.  Skin:    General: Skin is warm and dry.  Neurological:     Mental Status: She is alert and oriented to person, place, and time.  Psychiatric:        Mood and Affect: Mood normal.        Behavior: Behavior normal.        Thought Content: Thought content normal.        Judgment: Judgment normal.     BP 128/68 (BP Location: Left Arm, Patient Position: Sitting, Cuff Size: Normal)   Pulse 72   Temp 98.4 F (36.9 C) (Oral)   Resp 16   Ht 5\' 6"  (1.676 m)   Wt 184 lb (83.5 kg)  SpO2 95%   BMI 29.70 kg/m  Wt Readings from Last 3 Encounters:  11/17/22 184 lb (83.5 kg)  11/04/22 184 lb 9.6 oz (83.7 kg)  09/08/22 185 lb 9.6 oz (84.2 kg)    Diabetic Foot Exam - Simple   No data filed    Lab Results  Component Value Date   WBC 7.5 09/08/2022   HGB 13.5 09/08/2022   HCT 41.8 09/08/2022   PLT 216.0 09/08/2022   GLUCOSE 98 09/08/2022   CHOL 167 09/08/2022   TRIG 96.0 09/08/2022   HDL 77.70 09/08/2022   LDLDIRECT 111.2 02/06/2014   LDLCALC 70 09/08/2022   ALT 10 09/08/2022   AST 13 09/08/2022   NA 139  09/08/2022   K 4.6 09/08/2022   CL 104 09/08/2022   CREATININE 0.69 09/08/2022   BUN 14 09/08/2022   CO2 26 09/08/2022   TSH 1.02 09/08/2022   INR 2.2 09/21/2011   HGBA1C 7.0 (H) 09/08/2022   MICROALBUR <0.7 09/15/2022    Lab Results  Component Value Date   TSH 1.02 09/08/2022   Lab Results  Component Value Date   WBC 7.5 09/08/2022   HGB 13.5 09/08/2022   HCT 41.8 09/08/2022   MCV 88.9 09/08/2022   PLT 216.0 09/08/2022   Lab Results  Component Value Date   NA 139 09/08/2022   K 4.6 09/08/2022   CHLORIDE 110 (H) 10/10/2015   CO2 26 09/08/2022   GLUCOSE 98 09/08/2022   BUN 14 09/08/2022   CREATININE 0.69 09/08/2022   BILITOT 0.4 09/08/2022   ALKPHOS 69 09/08/2022   AST 13 09/08/2022   ALT 10 09/08/2022   PROT 6.3 09/08/2022   ALBUMIN 4.0 09/08/2022   CALCIUM 9.3 09/08/2022   ANIONGAP 8 10/10/2015   EGFR 86 (L) 10/10/2015   GFR 82.63 09/08/2022   Lab Results  Component Value Date   CHOL 167 09/08/2022   Lab Results  Component Value Date   HDL 77.70 09/08/2022   Lab Results  Component Value Date   LDLCALC 70 09/08/2022   Lab Results  Component Value Date   TRIG 96.0 09/08/2022   Lab Results  Component Value Date   CHOLHDL 2 09/08/2022   Lab Results  Component Value Date   HGBA1C 7.0 (H) 09/08/2022       Assessment & Plan:  Allergic dermatitis Assessment & Plan: Was recently hospitalized for pacemaker placement after complete heart block and she has had pruritic rashes on right arm and chest from tape and EKG leads. Try adding some Loratadine 10 mg twice daily and Famotidine 10 mg twice daily for 1-2 weeks keep lesions clean and dry. If any increase in lesion size or any discharge or increase pain will give an antibiotic to take. Keflex 500 mg po tid   Heart block Assessment & Plan: S/p pacemaker placement last week. She is feeling much better but is weak still  Orders: -     AMB referral to cardiac rehabilitation  Mixed  hyperlipidemia -     AMB referral to cardiac rehabilitation  Type 2 diabetes mellitus with obesity (HCC) -     AMB referral to cardiac rehabilitation  Asthma, mild intermittent, well-controlled Assessment & Plan: Referred to cardiopulmonary rehab to try and get her strength back up   Other orders -     Cephalexin; Take 1 capsule (500 mg total) by mouth 3 (three) times daily. (Patient not taking: Reported on 11/18/2022)  Dispense: 21 capsule; Refill: 0    Assessment  and Plan    Complete Heart Block Status post pacemaker placement. No current chest pain or heavy feelings. Noted serosanguinous drainage from a site on her arm after tape placement but no signs of infection. -Continue wound care with Betadine and hydrogen peroxide. -Start Keflex if signs of infection develop (increased drainage, pus, increased swelling or pain).  Allergic Reaction Likely allergic reaction to adhesive used during pacemaker placement. Noted itching and rash, but no signs of infection. -Start loratadine (Claritin) and famotidine (Pepcid) twice daily for one week. -Consider use of witch hazel for itching.  Cardiopulmonary Rehabilitation Status post pacemaker placement. -Set up cardiopulmonary rehabilitation at [facility] for asthma and deconditioning  Follow-up in October.         Danise Edge, MD

## 2022-11-19 NOTE — Assessment & Plan Note (Signed)
Referred to cardiopulmonary rehab to try and get her strength back up

## 2022-11-19 NOTE — Assessment & Plan Note (Signed)
S/p pacemaker placement last week. She is feeling much better but is weak still

## 2022-12-02 ENCOUNTER — Other Ambulatory Visit: Payer: Self-pay

## 2022-12-02 ENCOUNTER — Other Ambulatory Visit (HOSPITAL_COMMUNITY): Payer: Self-pay

## 2022-12-02 ENCOUNTER — Other Ambulatory Visit (HOSPITAL_BASED_OUTPATIENT_CLINIC_OR_DEPARTMENT_OTHER): Payer: Self-pay

## 2022-12-02 ENCOUNTER — Telehealth: Payer: Self-pay | Admitting: Family Medicine

## 2022-12-02 ENCOUNTER — Other Ambulatory Visit: Payer: Self-pay | Admitting: Family Medicine

## 2022-12-02 MED ORDER — CEPHALEXIN 500 MG PO CAPS
500.0000 mg | ORAL_CAPSULE | Freq: Three times a day (TID) | ORAL | 0 refills | Status: DC
  Filled 2022-12-02: qty 21, 7d supply, fill #0

## 2022-12-02 NOTE — Telephone Encounter (Signed)
Refill sent.

## 2022-12-02 NOTE — Telephone Encounter (Signed)
Prescription Request  12/02/2022  Is this a "Controlled Substance" medicine? No  LOV: 11/17/2022  What is the name of the medication or equipment?   [id number] cephALEXin (KEFLEX) 500 MG capsule  Have you contacted your pharmacy to request a refill? No   Which pharmacy would you like this sent to?   [facility] - [facility] [address] [place] [address] Phone: [phone] Fax: [phone]   Patient notified that their request is being sent to the clinical staff for review and that they should receive a response within 2 business days.   Please advise at [facility] [phone]

## 2022-12-03 ENCOUNTER — Other Ambulatory Visit (HOSPITAL_COMMUNITY): Payer: Self-pay

## 2022-12-04 ENCOUNTER — Other Ambulatory Visit (HOSPITAL_BASED_OUTPATIENT_CLINIC_OR_DEPARTMENT_OTHER): Payer: Self-pay

## 2022-12-11 ENCOUNTER — Telehealth (HOSPITAL_COMMUNITY): Payer: Self-pay

## 2022-12-11 NOTE — Telephone Encounter (Signed)
Pt is unable to do the pulmonary rehab program at this time do to work. Would like to see if she can do program next summer.   Closed referral.

## 2022-12-29 DIAGNOSIS — I442 Atrioventricular block, complete: Secondary | ICD-10-CM | POA: Diagnosis not present

## 2022-12-30 ENCOUNTER — Encounter: Payer: Self-pay | Admitting: Orthopedic Surgery

## 2022-12-30 ENCOUNTER — Ambulatory Visit: Payer: Medicare PPO | Admitting: Orthopedic Surgery

## 2022-12-30 ENCOUNTER — Ambulatory Visit: Payer: Medicare PPO | Admitting: Surgical

## 2022-12-30 ENCOUNTER — Telehealth: Payer: Self-pay

## 2022-12-30 DIAGNOSIS — M1711 Unilateral primary osteoarthritis, right knee: Secondary | ICD-10-CM

## 2022-12-30 DIAGNOSIS — M170 Bilateral primary osteoarthritis of knee: Secondary | ICD-10-CM | POA: Diagnosis not present

## 2022-12-30 DIAGNOSIS — M1712 Unilateral primary osteoarthritis, left knee: Secondary | ICD-10-CM | POA: Diagnosis not present

## 2022-12-30 NOTE — Telephone Encounter (Signed)
Auth needed for bilat knee gel to be done 03/30/23 or later

## 2022-12-31 NOTE — Telephone Encounter (Signed)
Next available gel injection will need to be after 03/30/2023.   Will submit in November, 2024.

## 2023-01-04 NOTE — Progress Notes (Signed)
Follow-up Office Visit Note   Patient: Vicki Dennis           Date of Birth: [birth year]           MRN: [id number] Visit Date: 12/30/2022 Requested by: Bradd Canary, MD [address] [place],  [place] [address] PCP: Bradd Canary, MD  Subjective: Chief Complaint  Patient presents with   Right Knee - Pain   Left Knee - Pain    HPI: Vicki Dennis is a 77 y.o. female who returns to the office for follow-up visit.    Plan at last visit was: Bilateral knee gel injection  Since then, patient notes significant relief from gel injections on 09/28/2022 by Dr. August Saucer.  Very pleased with how her knees are feeling and aside from some occasional soreness she really does not have much in the way of pain.  She is fairly active with consistent walking and working as a Runner, broadcasting/film/video.  No groin pain or radicular pain.  No new injury.              ROS: All systems reviewed are negative as they relate to the chief complaint within the history of present illness.  Patient denies fevers or chills.  Assessment & Plan: Visit Diagnoses:  1. Unilateral primary osteoarthritis, right knee   2. Arthritis of left knee     Plan: Vicki Dennis is a 77 y.o. female who returns to the office for follow-up visit.  Plan from last visit was noted above in HPI.  They now return with significant substantial relief from bilateral knee gel injections.  Very pleased with how her knees are feeling even now about 3 months later.  She is not having enough pain that she would like to proceed with cortisone injections today.  We will plan to preapproved her for gel injections at the next possible date which would be 6 months out from her last injection; plan for around March 30, 2023.  If she has flareup of pain in the meantime, could try cortisone injection.  Follow-up in about 3 months.  Follow-Up Instructions: No follow-ups on file.   Orders:  No orders of the defined types were placed in this  encounter.  No orders of the defined types were placed in this encounter.     Procedures: No procedures performed   Clinical Data: No additional findings.  Objective: Vital Signs: There were no vitals taken for this visit.  Physical Exam:  Constitutional: Patient appears well-developed HEENT:  Head: Normocephalic Eyes:EOM are normal Neck: Normal range of motion Cardiovascular: Normal rate Pulmonary/chest: Effort normal Neurologic: Patient is alert Skin: Skin is warm Psychiatric: Patient has normal mood and affect  Ortho Exam: Ortho exam demonstrates bilateral knees without effusion.  10 degree flexion contracture in the left knee and 5 degree flexion contracture in the right knee.  No cellulitis or skin changes noted.  No significant tenderness over the medial or lateral joint lines.  No pain with hip range of motion bilaterally.  Specialty Comments:  No specialty comments available.  Imaging: No results found.   PMFS History: Patient Active Problem List   Diagnosis Date Noted   Heart block 11/19/2022   Allergic dermatitis 11/17/2022   Left eye injury 11/04/2022   Chronic pain of left knee 06/01/2022   Seasonal and perennial allergic rhinoconjunctivitis 10/17/2020   Hiatal hernia 01/31/2018   Hemangioma of liver 01/31/2018   Renal cyst 01/31/2018   Seasonal and  perennial allergic rhinitis 06/17/2017   Abdominal mass 04/09/2017   Preventative health care 08/06/2014   Overweight (BMI 25.0-29.9) 02/10/2014   History of colonic polyps 02/10/2014   Gastroesophageal reflux disease 02/10/2014   Seborrheic keratosis 02/10/2014   Asthma, mild intermittent, well-controlled 02/10/2014   History of chicken pox    Mumps    Allergy    Umbilical hernia 02/29/2012   Portal vein thrombosis 03/09/2011   Type 2 diabetes mellitus with obesity (HCC) 11/05/2010   Hyperlipidemia 11/05/2010   Past Medical History:  Diagnosis Date   Allergy    Anemia    h/o   Asthma     Asthma, mild intermittent, well-controlled 02/10/2014   Blood transfusion without reported diagnosis    Chicken pox as a child   Diabetes mellitus    type-II, per Dr. Parke Simmers, no meds. , pt. reports weight loss has contributed  to control of diabetes.  She reports last HgbA1c- wnl.   Diabetes mellitus 11/05/2010   Diabetes mellitus type 2 in obese 11/05/2010   Diabetes mellitus type 2, uncontrolled 11/05/2010   Esophageal reflux 02/10/2014   GERD (gastroesophageal reflux disease)    rare use of OTC aid   Hyperglycemia 09/02/2015   Hyperlipidemia    Hypertension    Measles as a child   Measles    Medicare annual wellness visit, subsequent 08/06/2014   Mumps as a child   Obesity 02/10/2014   Pneumonia    as a child   Portal vein thrombosis 03/09/2011   coumadin ended summer of 2013   Preventative health care 08/12/2014   Rash and nonspecific skin eruption 01/07/2015   Seborrheic keratosis 02/10/2014   Follows with dermatology   Sinusitis, acute 03/23/2016    Family History  Problem Relation Age of Onset   Cancer Father        prostate   Aneurysm Mother 20       brain   Cancer Maternal Aunt     Past Surgical History:  Procedure Laterality Date   BREAST BIOPSY Bilateral    INSERTION OF MESH  03/16/2012   Procedure: INSERTION OF MESH;  Surgeon: Shelly Rubenstein, MD;  Location: [facility] OR;  Service: General;  Laterality: N/A;   KNEE ARTHROSCOPY W/ MENISCAL REPAIR     both knees   TONSILLECTOMY     UMBILICAL HERNIA REPAIR  03/16/2012   Procedure: HERNIA REPAIR UMBILICAL ADULT;  Surgeon: Shelly Rubenstein, MD;  Location: [facility] OR;  Service: General;  Laterality: N/A;   Social History   Occupational History   Not on file  Tobacco Use   Smoking status: Never   Smokeless tobacco: Never   Tobacco comments:    never used tobacco  Vaping Use   Vaping status: Never Used  Substance and Sexual Activity   Alcohol use: Yes    Alcohol/week: 0.0 standard drinks of alcohol    Comment: for  holidays   Drug use: No   Sexual activity: Not Currently    Comment: lives alone

## 2023-01-12 ENCOUNTER — Other Ambulatory Visit: Payer: Self-pay | Admitting: Family Medicine

## 2023-01-18 ENCOUNTER — Other Ambulatory Visit (HOSPITAL_BASED_OUTPATIENT_CLINIC_OR_DEPARTMENT_OTHER): Payer: Self-pay

## 2023-02-03 ENCOUNTER — Telehealth: Payer: Self-pay | Admitting: Family Medicine

## 2023-02-03 NOTE — Telephone Encounter (Signed)
Copied from CRM [id number]. Topic: Medicare AWV >> Feb 03, 2023 10:11 AM Payton Doughty wrote: Reason for CRM: Called LVM 02/03/2023 to schedule Annual Wellness Visit  Verlee Rossetti; Care Guide Ambulatory Clinical Support Lynn l [facility] Direct Dial: [phone]

## 2023-02-07 NOTE — Assessment & Plan Note (Signed)
Encourage heart healthy diet such as MIND or DASH diet, increase exercise, avoid trans fats, simple carbohydrates and processed foods, consider a krill or fish or flaxseed oil cap daily.  Tolerating Rosuvastatin 

## 2023-02-07 NOTE — Assessment & Plan Note (Signed)
Patient encouraged to maintain heart healthy diet, regular exercise, adequate sleep. Consider daily probiotics. Take medications as prescribed. Labs reviewed.  Colonoscopy 2019 due in 2024 [facility] January 2023, repeat next year Pap  Dexa January 2023, repeat in 2028 RSV (respiratory syncitial virus) vaccine at pharmacy Covid booster when new version at pharmacy High dose flu shot

## 2023-02-09 ENCOUNTER — Other Ambulatory Visit (HOSPITAL_BASED_OUTPATIENT_CLINIC_OR_DEPARTMENT_OTHER): Payer: Self-pay

## 2023-02-09 ENCOUNTER — Ambulatory Visit (INDEPENDENT_AMBULATORY_CARE_PROVIDER_SITE_OTHER): Payer: Medicare PPO | Admitting: *Deleted

## 2023-02-09 ENCOUNTER — Ambulatory Visit (INDEPENDENT_AMBULATORY_CARE_PROVIDER_SITE_OTHER): Payer: Medicare PPO | Admitting: Family Medicine

## 2023-02-09 ENCOUNTER — Other Ambulatory Visit: Payer: Self-pay | Admitting: Family Medicine

## 2023-02-09 VITALS — BP 154/80 | HR 87 | Temp 98.6°F | Resp 18 | Ht 67.0 in | Wt 182.0 lb

## 2023-02-09 DIAGNOSIS — E669 Obesity, unspecified: Secondary | ICD-10-CM | POA: Diagnosis not present

## 2023-02-09 DIAGNOSIS — Z1211 Encounter for screening for malignant neoplasm of colon: Secondary | ICD-10-CM

## 2023-02-09 DIAGNOSIS — J4520 Mild intermittent asthma, uncomplicated: Secondary | ICD-10-CM | POA: Diagnosis not present

## 2023-02-09 DIAGNOSIS — Z1231 Encounter for screening mammogram for malignant neoplasm of breast: Secondary | ICD-10-CM | POA: Diagnosis not present

## 2023-02-09 DIAGNOSIS — E782 Mixed hyperlipidemia: Secondary | ICD-10-CM | POA: Diagnosis not present

## 2023-02-09 DIAGNOSIS — Z0000 Encounter for general adult medical examination without abnormal findings: Secondary | ICD-10-CM

## 2023-02-09 DIAGNOSIS — E1169 Type 2 diabetes mellitus with other specified complication: Secondary | ICD-10-CM

## 2023-02-09 MED ORDER — DESOXIMETASONE 0.25 % EX CREA
1.0000 | TOPICAL_CREAM | Freq: Two times a day (BID) | CUTANEOUS | 2 refills | Status: AC
  Filled 2023-02-09: qty 30, 15d supply, fill #0

## 2023-02-09 MED ORDER — ROSUVASTATIN CALCIUM 10 MG PO TABS
10.0000 mg | ORAL_TABLET | Freq: Every day | ORAL | 0 refills | Status: DC
  Filled 2023-02-09: qty 90, 90d supply, fill #0

## 2023-02-09 MED ORDER — TACROLIMUS 0.1 % EX OINT
1.0000 | TOPICAL_OINTMENT | Freq: Two times a day (BID) | CUTANEOUS | 2 refills | Status: AC
  Filled 2023-02-09: qty 100, 50d supply, fill #0

## 2023-02-09 NOTE — Patient Instructions (Addendum)
RSV, Respiratory Syncitial Virus Vaccine, Arexvy vaccine at pharmacy Tetanus in 2027 or sooner if injured  Try topical Lidocaine gel over the counter to the elbow Netflix show: Live to 100 The Secrets of The [employer]  Preventive Care 65 Years and Older, Female Preventive care refers to lifestyle choices and visits with your health care provider that can promote health and wellness. Preventive care visits are also called wellness exams. What can I expect for my preventive care visit? Counseling Your health care provider may ask you questions about your: Medical history, including: Past medical problems. Family medical history. Pregnancy and menstrual history. History of falls. Current health, including: Memory and ability to understand (cognition). Emotional well-being. Home life and relationship well-being. Sexual activity and sexual health. Lifestyle, including: Alcohol, nicotine or tobacco, and drug use. Access to firearms. Diet, exercise, and sleep habits. Work and work Astronomer. Sunscreen use. Safety issues such as seatbelt and bike helmet use. Physical exam Your health care provider will check your: Height and weight. These may be used to calculate your BMI (body mass index). BMI is a measurement that tells if you are at a healthy weight. Waist circumference. This measures the distance around your waistline. This measurement also tells if you are at a healthy weight and may help predict your risk of certain diseases, such as type 2 diabetes and high blood pressure. Heart rate and blood pressure. Body temperature. Skin for abnormal spots. What immunizations do I need?  Vaccines are usually given at various ages, according to a schedule. Your health care provider will recommend vaccines for you based on your age, medical history, and lifestyle or other factors, such as travel or where you work. What tests do I need? Screening Your health care provider may recommend  screening tests for certain conditions. This may include: Lipid and cholesterol levels. Hepatitis C test. Hepatitis B test. HIV (human immunodeficiency virus) test. STI (sexually transmitted infection) testing, if you are at risk. Lung cancer screening. Colorectal cancer screening. Diabetes screening. This is done by checking your blood sugar (glucose) after you have not eaten for a while (fasting). Mammogram. Talk with your health care provider about how often you should have regular mammograms. BRCA-related cancer screening. This may be done if you have a family history of breast, ovarian, tubal, or peritoneal cancers. Bone density scan. This is done to screen for osteoporosis. Talk with your health care provider about your test results, treatment options, and if necessary, the need for more tests. Follow these instructions at home: Eating and drinking  Eat a diet that includes fresh fruits and vegetables, whole grains, lean protein, and low-fat dairy products. Limit your intake of foods with high amounts of sugar, saturated fats, and salt. Take vitamin and mineral supplements as recommended by your health care provider. Do not drink alcohol if your health care provider tells you not to drink. If you drink alcohol: Limit how much you have to 0-1 drink a day. Know how much alcohol is in your drink. In the [place] one drink equals one 12 oz bottle of beer (355 mL), one 5 oz glass of wine (148 mL), or one 1 oz glass of hard liquor (44 mL). Lifestyle Brush your teeth every morning and night with fluoride toothpaste. Floss one time each day. Exercise for at least 30 minutes 5 or more days each week. Do not use any products that contain nicotine or tobacco. These products include cigarettes, chewing tobacco, and vaping devices, such as e-cigarettes. If you  need help quitting, ask your health care provider. Do not use drugs. If you are sexually active, practice safe sex. Use a condom or other  form of protection in order to prevent STIs. Take aspirin only as told by your health care provider. Make sure that you understand how much to take and what form to take. Work with your health care provider to find out whether it is safe and beneficial for you to take aspirin daily. Ask your health care provider if you need to take a cholesterol-lowering medicine (statin). Find healthy ways to manage stress, such as: Meditation, yoga, or listening to music. Journaling. Talking to a trusted person. Spending time with friends and family. Minimize exposure to UV radiation to reduce your risk of skin cancer. Safety Always wear your seat belt while driving or riding in a vehicle. Do not drive: If you have been drinking alcohol. Do not ride with someone who has been drinking. When you are tired or distracted. While texting. If you have been using any mind-altering substances or drugs. Wear a helmet and other protective equipment during sports activities. If you have firearms in your house, make sure you follow all gun safety procedures. What's next? Visit your health care provider once a year for an annual wellness visit. Ask your health care provider how often you should have your eyes and teeth checked. Stay up to date on all vaccines. This information is not intended to replace advice given to you by your health care provider. Make sure you discuss any questions you have with your health care provider. Document Revised: 09/25/2020 Document Reviewed: 09/25/2020 Elsevier Patient Education  2024 [employer].

## 2023-02-09 NOTE — Progress Notes (Signed)
Subjective:   Vicki Dennis is a 77 y.o. female who presents for Medicare Annual (Subsequent) preventive examination.  Visit Complete: Virtual I connected with  Antonieta Pert on 02/09/23 by a audio enabled telemedicine application and verified that I am speaking with the correct person using two identifiers.  Patient Location: Home  Provider Location: Office/Clinic  I discussed the limitations of evaluation and management by telemedicine. The patient expressed understanding and agreed to proceed.  Vital Signs: Because this visit was a virtual/telehealth visit, some criteria may be missing or patient reported. Any vitals not documented were not able to be obtained and vitals that have been documented are patient reported.  Cardiac Risk Factors include: advanced age (>6men, >46 women);diabetes mellitus;dyslipidemia     Objective:    There were no vitals filed for this visit. There is no height or weight on file to calculate BMI.     02/09/2023   11:11 AM 08/01/2020    7:13 PM 06/15/2016    2:39 PM 10/10/2015   10:49 AM 02/22/2015    9:44 AM 02/22/2015    9:41 AM 10/05/2014   10:52 AM  Advanced Directives  Does Patient Have a Medical Advance Directive? Yes No Yes Yes;No No No No  Type of Diplomatic Services operational officer;Living will  Living will;Healthcare Power of Attorney      Does patient want to make changes to medical advance directive? No - Patient declined        Copy of Healthcare Power of Attorney in Chart? Yes - validated most recent copy scanned in chart (See row information)  No - copy requested      Would patient like information on creating a medical advance directive?  No - Patient declined   No - patient declined information No - patient declined information No - patient declined information    Current Medications (verified) Outpatient Encounter Medications as of 02/09/2023  Medication Sig   ACCU-CHEK AVIVA PLUS test strip use to check blood sugar  once daily as directed   albuterol (PROVENTIL HFA) 108 (90 Base) MCG/ACT inhaler Inhale 2 puffs into the lungs every 4 (four) hours as needed for wheezing or shortness of breath.   azelastine (ASTELIN) 0.1 % nasal spray Place 1 to 2 sprays in each nostril 1-2 times a day as needed for runny nose/drainage down throat   blood glucose meter kit and supplies KIT Dispense based on patient and insurance preference. Use up to four times daily as directed. (FOR ICD-9 250.00, 250.01).   diphenoxylate-atropine (LOMOTIL) 2.5-0.025 MG tablet Take 1 tablet by mouth 4 (four) times daily as needed for diarrhea or loose stools. (Patient not taking: Reported on 11/04/2022)   enalapril (VASOTEC) 2.5 MG tablet Take 1 tablet (2.5 mg total) by mouth daily.   EPINEPHrine 0.3 MG/0.3ML SOSY Use as directed for severe allergic reactions   fluticasone (FLONASE) 50 MCG/ACT nasal spray Place 2 sprays in each nostril once a day as needed for stuffy nose   fluticasone-salmeterol (ADVAIR HFA) 115-21 MCG/ACT inhaler Inhale 2 puffs into the lungs 2 (two) times daily.   Lancets 30G MISC Use as directed once daily to check blood sugar.  Diagnosis code E11.9   meloxicam (MOBIC) 7.5 MG tablet Take 1-2 tablets (7.5-15 mg total) by mouth daily as needed for pain.   montelukast (SINGULAIR) 10 MG tablet Take 1 tablet (10 mg total) by mouth at bedtime.   Multiple Vitamins-Minerals (MULTIVITAMIN WITH MINERALS) tablet Take 1 tablet by mouth  daily.   Olopatadine HCl 0.2 % SOLN Place 1 drop in each eye once a day as needed for itchy watery eyes   Omega-3 Fatty Acids (FISH OIL CONCENTRATE) 1000 MG CAPS Take by mouth 3 (three) times daily.   rosuvastatin (CRESTOR) 10 MG tablet Take 1 tablet (10 mg total) by mouth daily.   [DISCONTINUED] cephALEXin (KEFLEX) 500 MG capsule Take 1 capsule (500 mg total) by mouth 3 (three) times daily.   No facility-administered encounter medications on file as of 02/09/2023.    Allergies (verified) Codeine,  Hornet venom, Sulfa antibiotics, Doxycycline, Oxycodone, Wound dressing adhesive, and Amoxicillin   History: Past Medical History:  Diagnosis Date   Allergy    Anemia    h/o   Asthma    Asthma, mild intermittent, well-controlled 02/10/2014   Blood transfusion without reported diagnosis    Chicken pox as a child   Diabetes mellitus    type-II, per Dr. Parke Simmers, no meds. , pt. reports weight loss has contributed  to control of diabetes.  She reports last HgbA1c- wnl.   Diabetes mellitus 11/05/2010   Diabetes mellitus type 2 in obese 11/05/2010   Diabetes mellitus type 2, uncontrolled 11/05/2010   Esophageal reflux 02/10/2014   GERD (gastroesophageal reflux disease)    rare use of OTC aid   Hyperglycemia 09/02/2015   Hyperlipidemia    Hypertension    Measles as a child   Measles    Medicare annual wellness visit, subsequent 08/06/2014   Mumps as a child   Obesity 02/10/2014   Pneumonia    as a child   Portal vein thrombosis 03/09/2011   coumadin ended summer of 2013   Preventative health care 08/12/2014   Rash and nonspecific skin eruption 01/07/2015   Seborrheic keratosis 02/10/2014   Follows with dermatology   Sinusitis, acute 03/23/2016   Past Surgical History:  Procedure Laterality Date   BREAST BIOPSY Bilateral    INSERTION OF MESH  03/16/2012   Procedure: INSERTION OF MESH;  Surgeon: Shelly Rubenstein, MD;  Location: [facility] OR;  Service: General;  Laterality: N/A;   KNEE ARTHROSCOPY W/ MENISCAL REPAIR     both knees   TONSILLECTOMY     UMBILICAL HERNIA REPAIR  03/16/2012   Procedure: HERNIA REPAIR UMBILICAL ADULT;  Surgeon: Shelly Rubenstein, MD;  Location: [facility] OR;  Service: General;  Laterality: N/A;   Family History  Problem Relation Age of Onset   Cancer Father        prostate   Aneurysm Mother 52       brain   Cancer Maternal Aunt    Social History   Socioeconomic History   Marital status: Divorced    Spouse name: Not on file   Number of children: Not on file    Years of education: Not on file   Highest education level: Not on file  Occupational History   Not on file  Tobacco Use   Smoking status: Never   Smokeless tobacco: Never   Tobacco comments:    never used tobacco  Vaping Use   Vaping status: Never Used  Substance and Sexual Activity   Alcohol use: Yes    Alcohol/week: 0.0 standard drinks of alcohol    Comment: for holidays   Drug use: No   Sexual activity: Not Currently    Comment: lives alone   Other Topics Concern   Not on file  Social History Narrative   Retired Runner, broadcasting/film/video   Social Determinants of Health  Financial Resource Strain: Low Risk  (02/09/2023)   Overall Financial Resource Strain (CARDIA)    Difficulty of Paying Living Expenses: Not very hard  Food Insecurity: No Food Insecurity (02/09/2023)   Hunger Vital Sign    Worried About Running Out of Food in the Last Year: Never true    Ran Out of Food in the Last Year: Never true  Transportation Needs: No Transportation Needs (11/18/2022)   PRAPARE - Administrator, Civil Service (Medical): No    Lack of Transportation (Non-Medical): No  Physical Activity: Insufficiently Active (02/09/2023)   Exercise Vital Sign    Days of Exercise per Week: 2 days    Minutes of Exercise per Session: 60 min  Stress: No Stress Concern Present (02/09/2023)   [employer] of Occupational Health - Occupational Stress Questionnaire    Feeling of Stress : Only a little  Social Connections: Moderately Integrated (02/09/2023)   Social Connection and Isolation Panel [NHANES]    Frequency of Communication with Friends and Family: Three times a week    Frequency of Social Gatherings with Friends and Family: More than three times a week    Attends Religious Services: More than 4 times per year    Active Member of [employer] or Organizations: Yes    Attends Engineer, structural: More than 4 times per year    Marital Status: Divorced    Tobacco Counseling Counseling  given: Not Answered Tobacco comments: never used tobacco   Clinical Intake:  Pre-visit preparation completed: Yes  Pain : No/denies pain  Nutritional Risks: None Diabetes: Yes CBG done?: No Did pt. bring in CBG monitor from home?: No  How often do you need to have someone help you when you read instructions, pamphlets, or other written materials from your doctor or pharmacy?: 1 - Never  Interpreter Needed?: No  Information entered by :: Donne Anon, CMA   Activities of Daily Living    02/09/2023   11:08 AM  In your present state of health, do you have any difficulty performing the following activities:  Hearing? 0  Vision? 0  Difficulty concentrating or making decisions? 0  Walking or climbing stairs? 0  Dressing or bathing? 0  Doing errands, shopping? 0  Preparing Food and eating ? N  Using the Toilet? N  In the past six months, have you accidently leaked urine? N  Do you have problems with loss of bowel control? N  Managing your Medications? N  Managing your Finances? N  Housekeeping or managing your Housekeeping? N    Patient Care Team: Bradd Canary, MD as PCP - General (Family Medicine) Cherlyn Roberts, MD as Consulting Physician (Dermatology) Charna Elizabeth, MD as Consulting Physician (Gastroenterology)  Indicate any recent Medical Services you may have received from other than [facility] providers in the past year (date may be approximate).     Assessment:   This is a routine wellness examination for [place].  Hearing/Vision screen No results found.   Goals Addressed   None    Depression Screen    02/09/2023   11:33 AM 11/17/2022    1:57 PM 09/08/2022   11:00 AM 06/01/2022    8:38 AM 01/26/2022    8:22 AM 12/04/2021    9:11 AM 12/04/2021    9:10 AM  PHQ 2/9 Scores  PHQ - 2 Score 0 0 0 0 0 0 0  PHQ- 9 Score   0 0 0 0     Fall Risk  02/09/2023   11:19 AM 11/17/2022    1:57 PM 09/08/2022   11:01 AM 09/08/2022   11:00 AM 06/01/2022    8:36 AM   Fall Risk   Falls in the past year? 1 0 0 0 0  Number falls in past yr: 0 0 0 0 0  Injury with Fall? 0 0 0 0 0  Risk for fall due to : No Fall Risks      Follow up Falls evaluation completed Falls evaluation completed Falls evaluation completed Falls evaluation completed Falls evaluation completed    MEDICARE RISK AT HOME: Medicare Risk at Home Any stairs in or around the home?: Yes If so, are there any without handrails?: No Home free of loose throw rugs in walkways, pet beds, electrical cords, etc?: Yes Adequate lighting in your home to reduce risk of falls?: Yes Life alert?: No Use of a cane, walker or w/c?: No Grab bars in the bathroom?: No Shower chair or bench in shower?: No Elevated toilet seat or a handicapped toilet?: No  TIMED UP AND GO:  Was the test performed?  No    Cognitive Function:    06/15/2016    2:41 PM  MMSE - Mini Mental State Exam  Orientation to time 4  Orientation to Place 5  Registration 3  Attention/ Calculation 5  Recall 3  Language- name 2 objects 2  Language- repeat 1  Language- follow 3 step command 3  Language- read & follow direction 1  Write a sentence 1  Copy design 1  Total score 29        02/09/2023   11:37 AM  6CIT Screen  What Year? 0 points  What month? 0 points  What time? 0 points  Count back from 20 0 points  Months in reverse 0 points  Repeat phrase 4 points  Total Score 4 points    Immunizations Immunization History  Administered Date(s) Administered   Fluad Quad(high Dose 65+) 12/07/2018, 12/23/2019   Hepatitis A 04/16/2002, 10/01/2004   Hepatitis B 08/10/2002, 09/14/2011, 06/13/2012   IPV 04/14/2002   Influenza, High Dose Seasonal PF 12/20/2015, 12/28/2020   Influenza,inj,Quad PF,6+ Mos 01/07/2015, 12/20/2016   Influenza,inj,quad, With Preservative 12/08/2017   Influenza-Unspecified 01/16/2014, 12/20/2016, 12/28/2020   Moderna Covid-19 Vaccine Bivalent Booster 53yrs & up 04/02/2022   Moderna  Sars-Covid-2 Vaccination 04/27/2019, 05/25/2019, 02/09/2020, 09/11/2020   PPD Test 09/22/2022   Pneumococcal Conjugate-13 10/04/2015   Pneumococcal Polysaccharide-23 06/30/2008   Td 04/13/2005, 12/20/2015   Tdap 06/16/2011   Typhoid Inactivated 05/31/2017   Typhoid Live 05/31/2017   Yellow Fever 05/19/2002, 06/13/2017   Zoster Recombinant(Shingrix) 01/31/2018, 04/04/2018    TDAP status: Up to date  Flu Vaccine status: Due, Education has been provided regarding the importance of this vaccine. Advised may receive this vaccine at local pharmacy or Health Dept. Aware to provide a copy of the vaccination record if obtained from local pharmacy or Health Dept. Verbalized acceptance and understanding.  Pneumococcal vaccine status: Up to date  Covid-19 vaccine status: Information provided on how to obtain vaccines.   Qualifies for Shingles Vaccine? Yes   Zostavax completed No   Shingrix Completed?: Yes  Screening Tests Health Maintenance  Topic Date Due   FOOT EXAM  06/15/2017   Medicare Annual Wellness (AWV)  06/15/2017   INFLUENZA VACCINE  11/12/2022   COVID-19 Vaccine (6 - 2023-24 season) 12/13/2022   HEMOGLOBIN A1C  03/11/2023   OPHTHALMOLOGY EXAM  07/07/2023   Diabetic kidney evaluation - eGFR measurement  09/08/2023   Diabetic kidney evaluation - Urine ACR  09/15/2023   DTaP/Tdap/Td (4 - Td or Tdap) 12/19/2025   Pneumonia Vaccine 2+ Years old  Completed   DEXA SCAN  Completed   Hepatitis C Screening  Completed   Zoster Vaccines- Shingrix  Completed   HPV VACCINES  Aged Out   Colonoscopy  Discontinued    Health Maintenance  Health Maintenance Due  Topic Date Due   FOOT EXAM  06/15/2017   Medicare Annual Wellness (AWV)  06/15/2017   INFLUENZA VACCINE  11/12/2022   COVID-19 Vaccine (6 - 2023-24 season) 12/13/2022    Colorectal cancer screening: No longer required.   Mammogram status: Ordered 02/09/23. Pt provided with contact info and advised to call to schedule  appt.   Bone Density status: Completed 04/21/21. Results reflect: Bone density results: NORMAL. Repeat every 2 years.  Lung Cancer Screening: (Low Dose CT Chest recommended if Age 20-80 years, 20 pack-year currently smoking OR have quit w/in 15years.) does not qualify.   Additional Screening:  Hepatitis C Screening: does qualify; Completed 10/10/10  Vision Screening: Recommended annual ophthalmology exams for early detection of glaucoma and other disorders of the eye. Is the patient up to date with their annual eye exam?  Yes  Who is the provider or what is the name of the office in which the patient attends annual eye exams? Dr. Nile Riggs but will be getting a new eye doctor next year If pt is not established with a provider, would they like to be referred to a provider to establish care? No .   Dental Screening: Recommended annual dental exams for proper oral hygiene  Diabetic Foot Exam: Diabetic Foot Exam: Overdue, Pt has been advised about the importance in completing this exam. Pt is scheduled for diabetic foot exam on N/a.  Community Resource Referral / Chronic Care Management: CRR required this visit?  No   CCM required this visit?  No     Plan:     I have personally reviewed and noted the following in the patient's chart:   Medical and social history Use of alcohol, tobacco or illicit drugs  Current medications and supplements including opioid prescriptions. Patient is not currently taking opioid prescriptions. Functional ability and status Nutritional status Physical activity Advanced directives List of other physicians Hospitalizations, surgeries, and ER visits in previous 12 months Vitals Screenings to include cognitive, depression, and falls Referrals and appointments  In addition, I have reviewed and discussed with patient certain preventive protocols, quality metrics, and best practice recommendations. A written personalized care plan for preventive services as  well as general preventive health recommendations were provided to patient.     Donne Anon, CMA   02/09/2023   After Visit Summary: (MyChart) Due to this being a telephonic visit, the after visit summary with patients personalized plan was offered to patient via MyChart   Nurse Notes: None

## 2023-02-09 NOTE — Assessment & Plan Note (Signed)
hgba1c acceptable, minimize simple carbs. Increase exercise as tolerated. Continue current meds 

## 2023-02-09 NOTE — Patient Instructions (Signed)
Vicki Dennis , Thank you for taking time to come for your Medicare Wellness Visit. I appreciate your ongoing commitment to your health goals. Please review the following plan we discussed and let me know if I can assist you in the future.     This is a list of the screening recommended for you and due dates:  Health Maintenance  Topic Date Due   Complete foot exam   06/15/2017   Flu Shot  11/12/2022   COVID-19 Vaccine (6 - 2023-24 season) 12/13/2022   Hemoglobin A1C  03/11/2023   Eye exam for diabetics  07/07/2023   Yearly kidney function blood test for diabetes  09/08/2023   Yearly kidney health urinalysis for diabetes  09/15/2023   Medicare Annual Wellness Visit  02/09/2024   DTaP/Tdap/Td vaccine (4 - Td or Tdap) 12/19/2025   Pneumonia Vaccine  Completed   DEXA scan (bone density measurement)  Completed   Hepatitis C Screening  Completed   Zoster (Shingles) Vaccine  Completed   HPV Vaccine  Aged Out   Colon Cancer Screening  Discontinued     Next appointment: Follow up in one year for your annual wellness visit.   Preventive Care 70 Years and Older, Female Preventive care refers to lifestyle choices and visits with your health care provider that can promote health and wellness. What does preventive care include? A yearly physical exam. This is also called an annual well check. Dental exams once or twice a year. Routine eye exams. Ask your health care provider how often you should have your eyes checked. Personal lifestyle choices, including: Daily care of your teeth and gums. Regular physical activity. Eating a healthy diet. Avoiding tobacco and drug use. Limiting alcohol use. Practicing safe sex. Taking low-dose aspirin every day. Taking vitamin and mineral supplements as recommended by your health care provider. What happens during an annual well check? The services and screenings done by your health care provider during your annual well check will depend on your age,  overall health, lifestyle risk factors, and family history of disease. Counseling  Your health care provider may ask you questions about your: Alcohol use. Tobacco use. Drug use. Emotional well-being. Home and relationship well-being. Sexual activity. Eating habits. History of falls. Memory and ability to understand (cognition). Work and work Astronomer. Reproductive health. Screening  You may have the following tests or measurements: Height, weight, and BMI. Blood pressure. Lipid and cholesterol levels. These may be checked every 5 years, or more frequently if you are over 84 years old. Skin check. Lung cancer screening. You may have this screening every year starting at age 79 if you have a 30-pack-year history of smoking and currently smoke or have quit within the past 15 years. Fecal occult blood test (FOBT) of the stool. You may have this test every year starting at age 79. Flexible sigmoidoscopy or colonoscopy. You may have a sigmoidoscopy every 5 years or a colonoscopy every 10 years starting at age 79. Hepatitis C blood test. Hepatitis B blood test. Sexually transmitted disease (STD) testing. Diabetes screening. This is done by checking your blood sugar (glucose) after you have not eaten for a while (fasting). You may have this done every 1-3 years. Bone density scan. This is done to screen for osteoporosis. You may have this done starting at age 79. Mammogram. This may be done every 79 years. Talk to your health care provider about how often you should have regular mammograms. Talk with your health care provider about your  test results, treatment options, and if necessary, the need for more tests. Vaccines  Your health care provider may recommend certain vaccines, such as: Influenza vaccine. This is recommended every year. Tetanus, diphtheria, and acellular pertussis (Tdap, Td) vaccine. You may need a Td booster every 10 years. Zoster vaccine. You may need this after age  79. Pneumococcal 13-valent conjugate (PCV13) vaccine. One dose is recommended after age 79. Pneumococcal polysaccharide (PPSV23) vaccine. One dose is recommended after age 79. Talk to your health care provider about which screenings and vaccines you need and how often you need them. This information is not intended to replace advice given to you by your health care provider. Make sure you discuss any questions you have with your health care provider. Document Released: 04/26/2015 Document Revised: 12/18/2015 Document Reviewed: 01/29/2015 Elsevier Interactive Patient Education  2017 [employer].  Fall Prevention in the Home Falls can cause injuries. They can happen to people of all ages. There are many things you can do to make your home safe and to help prevent falls. What can I do on the outside of my home? Regularly fix the edges of walkways and driveways and fix any cracks. Remove anything that might make you trip as you walk through a door, such as a raised step or threshold. Trim any bushes or trees on the path to your home. Use bright outdoor lighting. Clear any walking paths of anything that might make someone trip, such as rocks or tools. Regularly check to see if handrails are loose or broken. Make sure that both sides of any steps have handrails. Any raised decks and porches should have guardrails on the edges. Have any leaves, snow, or ice cleared regularly. Use sand or salt on walking paths during winter. Clean up any spills in your garage right away. This includes oil or grease spills. What can I do in the bathroom? Use night lights. Install grab bars by the toilet and in the tub and shower. Do not use towel bars as grab bars. Use non-skid mats or decals in the tub or shower. If you need to sit down in the shower, use a plastic, non-slip stool. Keep the floor dry. Clean up any water that spills on the floor as soon as it happens. Remove soap buildup in the tub or shower  regularly. Attach bath mats securely with double-sided non-slip rug tape. Do not have throw rugs and other things on the floor that can make you trip. What can I do in the bedroom? Use night lights. Make sure that you have a light by your bed that is easy to reach. Do not use any sheets or blankets that are too big for your bed. They should not hang down onto the floor. Have a firm chair that has side arms. You can use this for support while you get dressed. Do not have throw rugs and other things on the floor that can make you trip. What can I do in the kitchen? Clean up any spills right away. Avoid walking on wet floors. Keep items that you use a lot in easy-to-reach places. If you need to reach something above you, use a strong step stool that has a grab bar. Keep electrical cords out of the way. Do not use floor polish or wax that makes floors slippery. If you must use wax, use non-skid floor wax. Do not have throw rugs and other things on the floor that can make you trip. What can I do with my  stairs? Do not leave any items on the stairs. Make sure that there are handrails on both sides of the stairs and use them. Fix handrails that are broken or loose. Make sure that handrails are as long as the stairways. Check any carpeting to make sure that it is firmly attached to the stairs. Fix any carpet that is loose or worn. Avoid having throw rugs at the top or bottom of the stairs. If you do have throw rugs, attach them to the floor with carpet tape. Make sure that you have a light switch at the top of the stairs and the bottom of the stairs. If you do not have them, ask someone to add them for you. What else can I do to help prevent falls? Wear shoes that: Do not have high heels. Have rubber bottoms. Are comfortable and fit you well. Are closed at the toe. Do not wear sandals. If you use a stepladder: Make sure that it is fully opened. Do not climb a closed stepladder. Make sure that  both sides of the stepladder are locked into place. Ask someone to hold it for you, if possible. Clearly mark and make sure that you can see: Any grab bars or handrails. First and last steps. Where the edge of each step is. Use tools that help you move around (mobility aids) if they are needed. These include: Canes. Walkers. Scooters. Crutches. Turn on the lights when you go into a dark area. Replace any light bulbs as soon as they burn out. Set up your furniture so you have a clear path. Avoid moving your furniture around. If any of your floors are uneven, fix them. If there are any pets around you, be aware of where they are. Review your medicines with your doctor. Some medicines can make you feel dizzy. This can increase your chance of falling. Ask your doctor what other things that you can do to help prevent falls. This information is not intended to replace advice given to you by your health care provider. Make sure you discuss any questions you have with your health care provider. Document Released: 01/24/2009 Document Revised: 09/05/2015 Document Reviewed: 05/04/2014 Elsevier Interactive Patient Education  2017 [employer].

## 2023-02-09 NOTE — Assessment & Plan Note (Signed)
No recent exacerbation, no changes 

## 2023-02-10 ENCOUNTER — Other Ambulatory Visit (HOSPITAL_BASED_OUTPATIENT_CLINIC_OR_DEPARTMENT_OTHER): Payer: Self-pay

## 2023-02-10 NOTE — Progress Notes (Signed)
Subjective:    Patient ID: Vicki Dennis, female    DOB: [birth year], 77 y.o.   MRN: [id number]  No chief complaint on file.   HPI Discussed the use of AI scribe software for clinical note transcription with the patient, who gave verbal consent to proceed.  History of Present Illness   The patient, a retired Runner, broadcasting/film/video with a history of allergies and a hernia, presents with persistent pain in her arm. She believes this pain is due to a nerve being hit during a previous hospital visit. The pain is deep and does not seem to be improving. Over-the-counter medications such as Tylenol provide little relief. The patient also mentions having a hernia, but does not elaborate on any symptoms or complications related to this condition. The patient is currently working on publishing an autobiographical book and is actively involved in her sorority and family. She reports forgetting to take her blood pressure medication due to being distracted by current events.     Denies CP/palp/SOB/HA/congestion/fevers/GI or GU c/o. Taking meds as prescribed    Past Medical History:  Diagnosis Date   Allergy    Anemia    h/o   Asthma    Asthma, mild intermittent, well-controlled 02/10/2014   Blood transfusion without reported diagnosis    Chicken pox as a child   Diabetes mellitus    type-II, per Dr. Parke Simmers, no meds. , pt. reports weight loss has contributed  to control of diabetes.  She reports last HgbA1c- wnl.   Diabetes mellitus 11/05/2010   Diabetes mellitus type 2 in obese 11/05/2010   Diabetes mellitus type 2, uncontrolled 11/05/2010   Esophageal reflux 02/10/2014   GERD (gastroesophageal reflux disease)    rare use of OTC aid   Hyperglycemia 09/02/2015   Hyperlipidemia    Hypertension    Measles as a child   Measles    Medicare annual wellness visit, subsequent 08/06/2014   Mumps as a child   Obesity 02/10/2014   Pneumonia    as a child   Portal vein thrombosis 03/09/2011   coumadin ended summer  of 2013   Preventative health care 08/12/2014   Rash and nonspecific skin eruption 01/07/2015   Seborrheic keratosis 02/10/2014   Follows with dermatology   Sinusitis, acute 03/23/2016    Past Surgical History:  Procedure Laterality Date   BREAST BIOPSY Bilateral    INSERTION OF MESH  03/16/2012   Procedure: INSERTION OF MESH;  Surgeon: Shelly Rubenstein, MD;  Location: [facility] OR;  Service: General;  Laterality: N/A;   KNEE ARTHROSCOPY W/ MENISCAL REPAIR     both knees   TONSILLECTOMY     UMBILICAL HERNIA REPAIR  03/16/2012   Procedure: HERNIA REPAIR UMBILICAL ADULT;  Surgeon: Shelly Rubenstein, MD;  Location: [facility] OR;  Service: General;  Laterality: N/A;    Family History  Problem Relation Age of Onset   Cancer Father        prostate   Aneurysm Mother 48       brain   Cancer Maternal Aunt     Social History   Socioeconomic History   Marital status: Divorced    Spouse name: Not on file   Number of children: Not on file   Years of education: Not on file   Highest education level: Not on file  Occupational History   Not on file  Tobacco Use   Smoking status: Never   Smokeless tobacco: Never   Tobacco comments:    never  used tobacco  Vaping Use   Vaping status: Never Used  Substance and Sexual Activity   Alcohol use: Yes    Alcohol/week: 0.0 standard drinks of alcohol    Comment: for holidays   Drug use: No   Sexual activity: Not Currently    Comment: lives alone   Other Topics Concern   Not on file  Social History Narrative   Retired Runner, broadcasting/film/video   Social Determinants of Health   Financial Resource Strain: Low Risk  (02/09/2023)   Overall Financial Resource Strain (CARDIA)    Difficulty of Paying Living Expenses: Not very hard  Food Insecurity: No Food Insecurity (02/09/2023)   Hunger Vital Sign    Worried About Running Out of Food in the Last Year: Never true    Ran Out of Food in the Last Year: Never true  Transportation Needs: No Transportation Needs (11/18/2022)    PRAPARE - Administrator, Civil Service (Medical): No    Lack of Transportation (Non-Medical): No  Physical Activity: Insufficiently Active (02/09/2023)   Exercise Vital Sign    Days of Exercise per Week: 2 days    Minutes of Exercise per Session: 60 min  Stress: No Stress Concern Present (02/09/2023)   [employer] of Occupational Health - Occupational Stress Questionnaire    Feeling of Stress : Only a little  Social Connections: Moderately Integrated (02/09/2023)   Social Connection and Isolation Panel [NHANES]    Frequency of Communication with Friends and Family: Three times a week    Frequency of Social Gatherings with Friends and Family: More than three times a week    Attends Religious Services: More than 4 times per year    Active Member of [employer] or Organizations: Yes    Attends Engineer, structural: More than 4 times per year    Marital Status: Divorced  Intimate Partner Violence: Not At Risk (02/09/2023)   Humiliation, Afraid, Rape, and Kick questionnaire    Fear of Current or Ex-Partner: No    Emotionally Abused: No    Physically Abused: No    Sexually Abused: No    Outpatient Medications Prior to Visit  Medication Sig Dispense Refill   ACCU-CHEK AVIVA PLUS test strip use to check blood sugar once daily as directed 100 strip 0   albuterol (PROVENTIL HFA) 108 (90 Base) MCG/ACT inhaler Inhale 2 puffs into the lungs every 4 (four) hours as needed for wheezing or shortness of breath. 18 g 1   azelastine (ASTELIN) 0.1 % nasal spray Place 1 to 2 sprays in each nostril 1-2 times a day as needed for runny nose/drainage down throat 30 mL 5   blood glucose meter kit and supplies KIT Dispense based on patient and insurance preference. Use up to four times daily as directed. (FOR ICD-9 250.00, 250.01). 1 each 0   diphenoxylate-atropine (LOMOTIL) 2.5-0.025 MG tablet Take 1 tablet by mouth 4 (four) times daily as needed for diarrhea or loose stools. 30 tablet  0   enalapril (VASOTEC) 2.5 MG tablet Take 1 tablet (2.5 mg total) by mouth daily. 90 tablet 0   EPINEPHrine 0.3 MG/0.3ML SOSY Use as directed for severe allergic reactions 2 each 1   fluticasone (FLONASE) 50 MCG/ACT nasal spray Place 2 sprays in each nostril once a day as needed for stuffy nose 16 g 5   fluticasone-salmeterol (ADVAIR HFA) 115-21 MCG/ACT inhaler Inhale 2 puffs into the lungs 2 (two) times daily. 1 each 5   Lancets 30G MISC Use  as directed once daily to check blood sugar.  Diagnosis code E11.9 100 each 5   meloxicam (MOBIC) 7.5 MG tablet Take 1-2 tablets (7.5-15 mg total) by mouth daily as needed for pain. 60 tablet 3   montelukast (SINGULAIR) 10 MG tablet Take 1 tablet (10 mg total) by mouth at bedtime. 90 tablet 5   Multiple Vitamins-Minerals (MULTIVITAMIN WITH MINERALS) tablet Take 1 tablet by mouth daily.     Olopatadine HCl 0.2 % SOLN Place 1 drop in each eye once a day as needed for itchy watery eyes 2.5 mL 5   Omega-3 Fatty Acids (FISH OIL CONCENTRATE) 1000 MG CAPS Take by mouth 3 (three) times daily.     rosuvastatin (CRESTOR) 10 MG tablet Take 1 tablet (10 mg total) by mouth daily. 90 tablet 3   No facility-administered medications prior to visit.    Allergies  Allergen Reactions   Codeine Other (See Comments)    "makes me crazy"   Hornet Venom Anaphylaxis   Sulfa Antibiotics Anaphylaxis    "died 3 times".   Doxycycline Other (See Comments)    "Yeast infection"   Oxycodone    Wound Dressing Adhesive    Amoxicillin Rash    Review of Systems  Constitutional:  Negative for fever and malaise/fatigue.  HENT:  Negative for congestion.   Eyes:  Negative for blurred vision.  Respiratory:  Negative for shortness of breath.   Cardiovascular:  Negative for chest pain, palpitations and leg swelling.  Gastrointestinal:  Negative for abdominal pain, blood in stool and nausea.  Genitourinary:  Negative for dysuria and frequency.  Musculoskeletal:  Positive for joint  pain. Negative for falls.  Skin:  Negative for rash.  Neurological:  Negative for dizziness, loss of consciousness and headaches.  Endo/Heme/Allergies:  Negative for environmental allergies.  Psychiatric/Behavioral:  Negative for depression. The patient is nervous/anxious.        Objective:    Physical Exam Constitutional:      General: She is not in acute distress.    Appearance: Normal appearance. She is well-developed. She is not toxic-appearing.  HENT:     Head: Normocephalic and atraumatic.     Right Ear: External ear normal.     Left Ear: External ear normal.     Nose: Nose normal.  Eyes:     General:        Right eye: No discharge.        Left eye: No discharge.     Conjunctiva/sclera: Conjunctivae normal.  Neck:     Thyroid: No thyromegaly.  Cardiovascular:     Rate and Rhythm: Normal rate and regular rhythm.     Heart sounds: Normal heart sounds. No murmur heard. Pulmonary:     Effort: Pulmonary effort is normal. No respiratory distress.     Breath sounds: Normal breath sounds.  Abdominal:     General: Bowel sounds are normal.     Palpations: Abdomen is soft.     Tenderness: There is no abdominal tenderness. There is no guarding.  Musculoskeletal:        General: Normal range of motion.     Cervical back: Neck supple.  Lymphadenopathy:     Cervical: No cervical adenopathy.  Skin:    General: Skin is warm and dry.  Neurological:     Mental Status: She is alert and oriented to person, place, and time.  Psychiatric:        Mood and Affect: Mood normal.  Behavior: Behavior normal.        Thought Content: Thought content normal.        Judgment: Judgment normal.     BP (!) 154/80 (BP Location: Right Arm, Patient Position: Sitting, Cuff Size: Normal)   Pulse 87   Temp 98.6 F (37 C) (Oral)   Resp 18   Ht 5\' 7"  (1.702 m)   Wt 182 lb (82.6 kg)   SpO2 97%   BMI 28.51 kg/m  Wt Readings from Last 3 Encounters:  02/09/23 182 lb (82.6 kg)  11/17/22  184 lb (83.5 kg)  11/04/22 184 lb 9.6 oz (83.7 kg)    Diabetic Foot Exam - Simple   No data filed    Lab Results  Component Value Date   WBC 7.5 09/08/2022   HGB 13.5 09/08/2022   HCT 41.8 09/08/2022   PLT 216.0 09/08/2022   GLUCOSE 98 09/08/2022   CHOL 167 09/08/2022   TRIG 96.0 09/08/2022   HDL 77.70 09/08/2022   LDLDIRECT 111.2 02/06/2014   LDLCALC 70 09/08/2022   ALT 10 09/08/2022   AST 13 09/08/2022   NA 139 09/08/2022   K 4.6 09/08/2022   CL 104 09/08/2022   CREATININE 0.69 09/08/2022   BUN 14 09/08/2022   CO2 26 09/08/2022   TSH 1.02 09/08/2022   INR 2.2 09/21/2011   HGBA1C 7.0 (H) 09/08/2022   MICROALBUR <0.7 09/15/2022    Lab Results  Component Value Date   TSH 1.02 09/08/2022   Lab Results  Component Value Date   WBC 7.5 09/08/2022   HGB 13.5 09/08/2022   HCT 41.8 09/08/2022   MCV 88.9 09/08/2022   PLT 216.0 09/08/2022   Lab Results  Component Value Date   NA 139 09/08/2022   K 4.6 09/08/2022   CHLORIDE 110 (H) 10/10/2015   CO2 26 09/08/2022   GLUCOSE 98 09/08/2022   BUN 14 09/08/2022   CREATININE 0.69 09/08/2022   BILITOT 0.4 09/08/2022   ALKPHOS 69 09/08/2022   AST 13 09/08/2022   ALT 10 09/08/2022   PROT 6.3 09/08/2022   ALBUMIN 4.0 09/08/2022   CALCIUM 9.3 09/08/2022   ANIONGAP 8 10/10/2015   EGFR 86 (L) 10/10/2015   GFR 82.63 09/08/2022   Lab Results  Component Value Date   CHOL 167 09/08/2022   Lab Results  Component Value Date   HDL 77.70 09/08/2022   Lab Results  Component Value Date   LDLCALC 70 09/08/2022   Lab Results  Component Value Date   TRIG 96.0 09/08/2022   Lab Results  Component Value Date   CHOLHDL 2 09/08/2022   Lab Results  Component Value Date   HGBA1C 7.0 (H) 09/08/2022       Assessment & Plan:  Mixed hyperlipidemia Assessment & Plan: Encourage heart healthy diet such as MIND or DASH diet, increase exercise, avoid trans fats, simple carbohydrates and processed foods, consider a krill or  fish or flaxseed oil cap daily. Tolerating Rosuvastatin  Orders: -     Lipid panel; Future -     TSH; Future  Preventative health care Assessment & Plan: Patient encouraged to maintain heart healthy diet, regular exercise, adequate sleep. Consider daily probiotics. Take medications as prescribed. Labs reviewed.  Colonoscopy 2019 due in 2024 but is now aged out so we will proceed with iFOB [facility] January 2023, repeat next year Pap has aged out Dexa January 2023, repeat in 2028 RSV (respiratory syncitial virus) vaccine at pharmacy Covid booster when new version  at pharmacy High dose flu shot    Type 2 diabetes mellitus with obesity (HCC) Assessment & Plan: hgba1c acceptable, minimize simple carbs. Increase exercise as tolerated. Continue current meds   Orders: -     Comprehensive metabolic panel; Future -     Hemoglobin A1c; Future -     TSH; Future -     Microalbumin / creatinine urine ratio; Future  Asthma, mild intermittent, well-controlled Assessment & Plan: No recent exacerbation, no changes  Orders: -     CBC with Differential/Platelet; Future  Encounter for screening mammogram for malignant neoplasm of breast -     3D Screening Mammogram, Left and Right; Future  Screening for colon cancer -     Fecal occult blood, imunochemical; Future  Other orders -     Tacrolimus; Apply 1 Application topically 2 (two) times daily.  Dispense: 100 g; Refill: 2 -     Desoximetasone; Apply 1 Application topically 2 (two) times daily.  Dispense: 30 g; Refill: 2    Assessment and Plan    Post-procedural nerve pain Persistent deep pain in the left arm following a procedure, likely due to nerve injury. No relief with Tylenol. -Try topical lidocaine gel over the counter for pain relief. -Continue taking fish oil and multivitamin daily to aid nerve regeneration.  Hypertension Patient forgot to take blood pressure medication for two days, resulting in elevated blood pressure during  the visit. -Resume regular blood pressure medication regimen. -Return in one month for a blood pressure check and lab work.  General Health Maintenance -Order mammogram to be done within the next six months. -Order iFOBT for colorectal cancer screening due to history of polyp. -Stop Pap smears due to patient's age and no history of abnormal PAPs. -Check RSV vaccine status at [facility]. -Next tetanus shot due in 2027 unless injury occurs.  Follow-up -Return in one month for blood pressure check and lab work. -Return in four months for routine visit.         Danise Edge, MD

## 2023-02-11 ENCOUNTER — Other Ambulatory Visit (HOSPITAL_BASED_OUTPATIENT_CLINIC_OR_DEPARTMENT_OTHER): Payer: Self-pay

## 2023-02-15 DIAGNOSIS — I442 Atrioventricular block, complete: Secondary | ICD-10-CM | POA: Diagnosis not present

## 2023-02-15 DIAGNOSIS — Z950 Presence of cardiac pacemaker: Secondary | ICD-10-CM | POA: Diagnosis not present

## 2023-02-16 DIAGNOSIS — Z1330 Encounter for screening examination for mental health and behavioral disorders, unspecified: Secondary | ICD-10-CM | POA: Diagnosis not present

## 2023-02-16 DIAGNOSIS — I442 Atrioventricular block, complete: Secondary | ICD-10-CM | POA: Diagnosis not present

## 2023-02-16 DIAGNOSIS — I10 Essential (primary) hypertension: Secondary | ICD-10-CM | POA: Diagnosis not present

## 2023-02-16 DIAGNOSIS — I350 Nonrheumatic aortic (valve) stenosis: Secondary | ICD-10-CM | POA: Diagnosis not present

## 2023-02-23 ENCOUNTER — Encounter (HOSPITAL_BASED_OUTPATIENT_CLINIC_OR_DEPARTMENT_OTHER): Payer: Self-pay

## 2023-02-23 ENCOUNTER — Ambulatory Visit (HOSPITAL_BASED_OUTPATIENT_CLINIC_OR_DEPARTMENT_OTHER)
Admission: RE | Admit: 2023-02-23 | Discharge: 2023-02-23 | Disposition: A | Discharge status: Home / Self Care | Payer: Medicare PPO | Source: Ambulatory Visit | Attending: Family Medicine | Admitting: Family Medicine

## 2023-02-23 DIAGNOSIS — Z1231 Encounter for screening mammogram for malignant neoplasm of breast: Secondary | ICD-10-CM | POA: Insufficient documentation

## 2023-03-03 ENCOUNTER — Other Ambulatory Visit: Payer: Medicare PPO

## 2023-03-16 ENCOUNTER — Telehealth: Payer: Self-pay

## 2023-03-16 ENCOUNTER — Other Ambulatory Visit (INDEPENDENT_AMBULATORY_CARE_PROVIDER_SITE_OTHER): Payer: Medicare PPO

## 2023-03-16 DIAGNOSIS — E1169 Type 2 diabetes mellitus with other specified complication: Secondary | ICD-10-CM

## 2023-03-16 DIAGNOSIS — J4520 Mild intermittent asthma, uncomplicated: Secondary | ICD-10-CM | POA: Diagnosis not present

## 2023-03-16 DIAGNOSIS — E782 Mixed hyperlipidemia: Secondary | ICD-10-CM

## 2023-03-16 DIAGNOSIS — E669 Obesity, unspecified: Secondary | ICD-10-CM | POA: Diagnosis not present

## 2023-03-16 LAB — CBC WITH DIFFERENTIAL/PLATELET
Basophils Absolute: 0.1 10*3/uL (ref 0.0–0.1)
Basophils Relative: 1.1 % (ref 0.0–3.0)
Eosinophils Absolute: 0.3 10*3/uL (ref 0.0–0.7)
Eosinophils Relative: 4.5 % (ref 0.0–5.0)
HCT: 41.4 % (ref 36.0–46.0)
Hemoglobin: 13.3 g/dL (ref 12.0–15.0)
Lymphocytes Relative: 28.9 % (ref 12.0–46.0)
Lymphs Abs: 2 10*3/uL (ref 0.7–4.0)
MCHC: 32.2 g/dL (ref 30.0–36.0)
MCV: 89.5 fL (ref 78.0–100.0)
Monocytes Absolute: 0.6 10*3/uL (ref 0.1–1.0)
Monocytes Relative: 8.8 % (ref 3.0–12.0)
Neutro Abs: 3.9 10*3/uL (ref 1.4–7.7)
Neutrophils Relative %: 56.7 % (ref 43.0–77.0)
Platelets: 207 10*3/uL (ref 150.0–400.0)
RBC: 4.62 Mil/uL (ref 3.87–5.11)
RDW: 14.2 % (ref 11.5–15.5)
WBC: 6.8 10*3/uL (ref 4.0–10.5)

## 2023-03-16 LAB — HEMOGLOBIN A1C: Hgb A1c MFr Bld: 7.1 % — ABNORMAL HIGH (ref 4.6–6.5)

## 2023-03-16 NOTE — Telephone Encounter (Signed)
Left message on machine to call back.  Pt does need the lab work done.  Labs reordered.

## 2023-03-16 NOTE — Telephone Encounter (Signed)
Patient came in for labs the only active order was cbc and a1-c and IFOB I saw expired orders for Lipid , comp and TSH and Micro albumin.  Only collect the cbc and al-c today.  If the expired orders are needed can place an active future order.

## 2023-03-17 NOTE — Telephone Encounter (Signed)
Patent already scheduled.

## 2023-03-17 NOTE — Telephone Encounter (Signed)
Followed up with a second call today. No answer. LVM

## 2023-03-17 NOTE — Telephone Encounter (Signed)
Pt called back to follow up

## 2023-03-18 ENCOUNTER — Other Ambulatory Visit: Payer: Self-pay | Admitting: *Deleted

## 2023-03-18 MED ORDER — ENALAPRIL MALEATE 2.5 MG PO TABS: 2.5000 mg | ORAL_TABLET | Freq: Every day | ORAL | 1 refills | Status: DC

## 2023-03-22 ENCOUNTER — Other Ambulatory Visit (INDEPENDENT_AMBULATORY_CARE_PROVIDER_SITE_OTHER): Payer: Medicare PPO

## 2023-03-22 ENCOUNTER — Telehealth: Payer: Self-pay | Admitting: Emergency Medicine

## 2023-03-22 DIAGNOSIS — Z1211 Encounter for screening for malignant neoplasm of colon: Secondary | ICD-10-CM

## 2023-03-22 NOTE — Telephone Encounter (Signed)
Lab results mailed to patient.

## 2023-03-23 LAB — FECAL OCCULT BLOOD, IMMUNOCHEMICAL: Fecal Occult Bld: NEGATIVE

## 2023-03-24 ENCOUNTER — Other Ambulatory Visit (INDEPENDENT_AMBULATORY_CARE_PROVIDER_SITE_OTHER): Payer: Medicare PPO

## 2023-03-24 DIAGNOSIS — E1169 Type 2 diabetes mellitus with other specified complication: Secondary | ICD-10-CM | POA: Diagnosis not present

## 2023-03-24 DIAGNOSIS — E669 Obesity, unspecified: Secondary | ICD-10-CM | POA: Diagnosis not present

## 2023-03-24 DIAGNOSIS — E782 Mixed hyperlipidemia: Secondary | ICD-10-CM | POA: Diagnosis not present

## 2023-03-24 LAB — COMPREHENSIVE METABOLIC PANEL
ALT: 12 U/L (ref 0–35)
AST: 14 U/L (ref 0–37)
Albumin: 4 g/dL (ref 3.5–5.2)
Alkaline Phosphatase: 76 U/L (ref 39–117)
BUN: 19 mg/dL (ref 6–23)
CO2: 27 meq/L (ref 19–32)
Calcium: 9.1 mg/dL (ref 8.4–10.5)
Chloride: 106 meq/L (ref 96–112)
Creatinine, Ser: 0.71 mg/dL (ref 0.40–1.20)
GFR: 80.64 mL/min (ref >60.00)
Glucose, Bld: 121 mg/dL — ABNORMAL HIGH (ref 70–99)
Potassium: 4.3 meq/L (ref 3.5–5.1)
Sodium: 140 meq/L (ref 135–145)
Total Bilirubin: 0.6 mg/dL (ref 0.2–1.2)
Total Protein: 6.2 g/dL (ref 6.0–8.3)

## 2023-03-24 LAB — TSH: TSH: 1.06 u[IU]/mL (ref 0.35–5.50)

## 2023-03-24 LAB — MICROALBUMIN / CREATININE URINE RATIO
Creatinine,U: 81.4 mg/dL
Microalb Creat Ratio: 3.2 mg/g (ref 0.0–30.0)
Microalb, Ur: 2.6 mg/dL — ABNORMAL HIGH (ref 0.0–1.9)

## 2023-03-24 LAB — LIPID PANEL
Cholesterol: 155 mg/dL (ref 0–200)
HDL: 67.5 mg/dL (ref >39.00)
LDL Cholesterol: 74 mg/dL (ref 0–99)
NonHDL: 87.22
Total CHOL/HDL Ratio: 2
Triglycerides: 64 mg/dL (ref 0.0–149.0)
VLDL: 12.8 mg/dL (ref 0.0–40.0)

## 2023-03-31 ENCOUNTER — Ambulatory Visit: Payer: Medicare PPO | Admitting: Orthopedic Surgery

## 2023-03-31 ENCOUNTER — Other Ambulatory Visit: Payer: Self-pay

## 2023-03-31 DIAGNOSIS — M1711 Unilateral primary osteoarthritis, right knee: Secondary | ICD-10-CM

## 2023-03-31 DIAGNOSIS — M1712 Unilateral primary osteoarthritis, left knee: Secondary | ICD-10-CM

## 2023-04-02 ENCOUNTER — Ambulatory Visit: Payer: Medicare PPO | Admitting: Physician Assistant

## 2023-04-02 ENCOUNTER — Encounter: Payer: Self-pay | Admitting: Physician Assistant

## 2023-04-02 DIAGNOSIS — M170 Bilateral primary osteoarthritis of knee: Secondary | ICD-10-CM

## 2023-04-02 DIAGNOSIS — M1711 Unilateral primary osteoarthritis, right knee: Secondary | ICD-10-CM

## 2023-04-02 DIAGNOSIS — M1712 Unilateral primary osteoarthritis, left knee: Secondary | ICD-10-CM

## 2023-04-02 MED ORDER — HYALURONAN 88 MG/4ML IX SOSY
88.0000 mg | PREFILLED_SYRINGE | INTRA_ARTICULAR | Status: AC | PRN
  Administered 2023-04-02: 88 mg via INTRA_ARTICULAR

## 2023-04-02 NOTE — Progress Notes (Signed)
Office Visit Note   Patient: Vicki Dennis           Date of Birth: 1945-09-03           MRN: [id number] Visit Date: 04/02/2023              Requested by: Bradd Canary, MD [address] [place],  [place] [address] PCP: Bradd Canary, MD  Chief Complaint  Patient presents with  . Right Knee - Follow-up    Monovisc  . Left Knee - Follow-up    Monovisc      HPI: Ms. Vicki Dennis is a pleasant 77 year old woman with a history of osteoarthritis of both of her knees.  She periodically gets Monovisc injections that seem to help her quite a bit.  Would like to go forward with them today  Assessment & Plan: Visit Diagnoses: Osteoarthritis bilateral knees  Plan: Went forward with Monovisc injections without difficulty may follow-up as needed  Follow-Up Instructions: No follow-ups on file.   Ortho Exam  Patient is alert, oriented, no adenopathy, well-dressed, normal affect, normal respiratory effort. Bilateral knees no effusion no erythema compartments are soft and compressible she is neurovascularly intact  Imaging: No results found. No images are attached to the encounter.  Labs: Lab Results  Component Value Date   HGBA1C 7.1 (H) 03/16/2023   HGBA1C 7.0 (H) 09/08/2022   HGBA1C 7.1 (H) 05/26/2022   REPTSTATUS 10/16/2010 FINAL 10/12/2010   REPTSTATUS 10/14/2010 FINAL 10/12/2010   CULT  10/12/2010    NO SALMONELLA, SHIGELLA, CAMPYLOBACTER, OR YERSINIA ISOLATED Note: REDUCED NORMAL FLORA PRESENT     Lab Results  Component Value Date   ALBUMIN 4.0 03/24/2023   ALBUMIN 4.0 09/08/2022   ALBUMIN 3.8 05/26/2022    No results found for: "MG" Lab Results  Component Value Date   VD25OH 38.09 03/16/2022    No results found for: "PREALBUMIN"    Latest Ref Rng & Units 03/16/2023    8:22 AM 09/08/2022   11:46 AM 05/26/2022    7:59 AM  CBC EXTENDED  WBC 4.0 - 10.5 K/uL 6.8  7.5  8.6   RBC 3.87 - 5.11 Mil/uL 4.62  4.70  4.42   Hemoglobin 12.0 - 15.0 g/dL  19.1  47.8  29.5   HCT 36.0 - 46.0 % 41.4  41.8  39.4   Platelets 150.0 - 400.0 K/uL 207.0  216.0  199.0   NEUT# 1.4 - 7.7 K/uL 3.9  4.4  5.8   Lymph# 0.7 - 4.0 K/uL 2.0  2.1  1.7      There is no height or weight on file to calculate BMI.  Orders:  No orders of the defined types were placed in this encounter.  No orders of the defined types were placed in this encounter.    Procedures: Large Joint Inj: bilateral knee on 04/02/2023 2:44 PM Indications: pain and diagnostic evaluation Details: 22 G 1.5 in needle, anteromedial approach  Arthrogram: No  Medications (Right): 88 mg Hyaluronan 88 MG/4ML Medications (Left): 88 mg Hyaluronan 88 MG/4ML Outcome: tolerated well, no immediate complications Procedure, treatment alternatives, risks and benefits explained, specific risks discussed. Consent was given by the patient.    Clinical Data: No additional findings.  ROS:  All other systems negative, except as noted in the HPI. Review of Systems  Objective: Vital Signs: There were no vitals taken for this visit.  Specialty Comments:  No specialty comments available.  PMFS History: Patient Active Problem  List   Diagnosis Date Noted  . Heart block 11/19/2022  . Allergic dermatitis 11/17/2022  . Left eye injury 11/04/2022  . Chronic pain of left knee 06/01/2022  . Seasonal and perennial allergic rhinoconjunctivitis 10/17/2020  . Hiatal hernia 01/31/2018  . Hemangioma of liver 01/31/2018  . Renal cyst 01/31/2018  . Seasonal and perennial allergic rhinitis 06/17/2017  . Abdominal mass 04/09/2017  . Preventative health care 08/06/2014  . Overweight (BMI 25.0-29.9) 02/10/2014  . History of colonic polyps 02/10/2014  . Gastroesophageal reflux disease 02/10/2014  . Seborrheic keratosis 02/10/2014  . Asthma, mild intermittent, well-controlled 02/10/2014  . History of chicken pox   . Mumps   . Allergy   . Umbilical hernia 02/29/2012  . Portal vein thrombosis 03/09/2011   . Type 2 diabetes mellitus with obesity (HCC) 11/05/2010  . Hyperlipidemia 11/05/2010   Past Medical History:  Diagnosis Date  . Allergy   . Anemia    h/o  . Asthma   . Asthma, mild intermittent, well-controlled 02/10/2014  . Blood transfusion without reported diagnosis   . Chicken pox as a child  . Diabetes mellitus    type-II, per Dr. Parke Simmers, no meds. , pt. reports weight loss has contributed  to control of diabetes.  She reports last HgbA1c- wnl.  . Diabetes mellitus 11/05/2010  . Diabetes mellitus type 2 in obese 11/05/2010  . Diabetes mellitus type 2, uncontrolled 11/05/2010  . Esophageal reflux 02/10/2014  . GERD (gastroesophageal reflux disease)    rare use of OTC aid  . Hyperglycemia 09/02/2015  . Hyperlipidemia   . Hypertension   . Measles as a child  . Measles   . Medicare annual wellness visit, subsequent 08/06/2014  . Mumps as a child  . Obesity 02/10/2014  . Pneumonia    as a child  . Portal vein thrombosis 03/09/2011   coumadin ended summer of 2013  . Preventative health care 08/12/2014  . Rash and nonspecific skin eruption 01/07/2015  . Seborrheic keratosis 02/10/2014   Follows with dermatology  . Sinusitis, acute 03/23/2016    Family History  Problem Relation Age of Onset  . Cancer Father        prostate  . Aneurysm Mother 21       brain  . Cancer Maternal Aunt     Past Surgical History:  Procedure Laterality Date  . BREAST BIOPSY Bilateral   . INSERTION OF MESH  03/16/2012   Procedure: INSERTION OF MESH;  Surgeon: Shelly Rubenstein, MD;  Location: [facility] OR;  Service: General;  Laterality: N/A;  . KNEE ARTHROSCOPY W/ MENISCAL REPAIR     both knees  . TONSILLECTOMY    . UMBILICAL HERNIA REPAIR  03/16/2012   Procedure: HERNIA REPAIR UMBILICAL ADULT;  Surgeon: Shelly Rubenstein, MD;  Location: [facility] OR;  Service: General;  Laterality: N/A;   Social History   Occupational History  . Not on file  Tobacco Use  . Smoking status: Never  . Smokeless tobacco:  Never  . Tobacco comments:    never used tobacco  Vaping Use  . Vaping status: Never Used  Substance and Sexual Activity  . Alcohol use: Yes    Alcohol/week: 0.0 standard drinks of alcohol    Comment: for holidays  . Drug use: No  . Sexual activity: Not Currently    Comment: lives alone

## 2023-04-28 ENCOUNTER — Other Ambulatory Visit: Payer: Self-pay

## 2023-04-28 ENCOUNTER — Encounter: Payer: Self-pay | Admitting: Allergy

## 2023-04-28 ENCOUNTER — Other Ambulatory Visit (HOSPITAL_BASED_OUTPATIENT_CLINIC_OR_DEPARTMENT_OTHER): Payer: Self-pay

## 2023-04-28 ENCOUNTER — Ambulatory Visit: Payer: Medicare PPO | Admitting: Allergy

## 2023-04-28 VITALS — BP 124/82 | HR 89 | Temp 98.2°F | Resp 12

## 2023-04-28 DIAGNOSIS — J3089 Other allergic rhinitis: Secondary | ICD-10-CM | POA: Diagnosis not present

## 2023-04-28 DIAGNOSIS — J302 Other seasonal allergic rhinitis: Secondary | ICD-10-CM

## 2023-04-28 DIAGNOSIS — J4540 Moderate persistent asthma, uncomplicated: Secondary | ICD-10-CM

## 2023-04-28 DIAGNOSIS — H1013 Acute atopic conjunctivitis, bilateral: Secondary | ICD-10-CM

## 2023-04-28 MED ORDER — AZELASTINE HCL 0.1 % NA SOLN
2.0000 | Freq: Two times a day (BID) | NASAL | 5 refills | Status: DC
  Filled 2023-04-28: qty 30, 50d supply, fill #0
  Filled 2023-09-14: qty 30, 30d supply, fill #0

## 2023-04-28 MED ORDER — OLOPATADINE HCL 0.2 % OP SOLN
1.0000 [drp] | Freq: Every day | OPHTHALMIC | 5 refills | Status: AC | PRN
  Filled 2023-04-28: qty 2.5, 25d supply, fill #0
  Filled 2023-07-22: qty 2.5, 30d supply, fill #0
  Filled 2023-10-22 – 2023-11-02 (×2): qty 2.5, 30d supply, fill #1
  Filled 2024-01-05 (×2): qty 2.5, 30d supply, fill #2

## 2023-04-28 MED ORDER — FLUTICASONE-SALMETEROL 115-21 MCG/ACT IN AERO
2.0000 | INHALATION_SPRAY | Freq: Two times a day (BID) | RESPIRATORY_TRACT | 5 refills | Status: DC
  Filled 2023-04-28 – 2023-05-21 (×2): qty 12, 30d supply, fill #0
  Filled 2023-08-10 (×2): qty 12, 30d supply, fill #1

## 2023-04-28 MED ORDER — ALBUTEROL SULFATE HFA 108 (90 BASE) MCG/ACT IN AERS
2.0000 | INHALATION_SPRAY | RESPIRATORY_TRACT | 1 refills | Status: AC | PRN
  Filled 2023-04-28: qty 18, 17d supply, fill #0
  Filled 2024-01-05 (×2): qty 18, 30d supply, fill #0

## 2023-04-28 MED ORDER — CETIRIZINE HCL 10 MG PO TABS
10.0000 mg | ORAL_TABLET | Freq: Every day | ORAL | 1 refills | Status: AC
  Filled 2023-04-28: qty 100, 100d supply, fill #0

## 2023-04-28 NOTE — Progress Notes (Signed)
 Follow-up Note  RE: Vicki Dennis MRN: [id number] DOB: [birth year] Date of Office Visit: 04/28/2023   History of present illness: Vicki Dennis is a 78 y.o. female presenting today for follow-up of asthma, allergic rhinitis or conjunctivitis.  She was last seen in the office on 08/03/2022 by our nurse practitioner Eddie Good.  Discussed the use of AI scribe software for clinical note transcription with the patient, who gave verbal consent to proceed.  She reports seasonal exacerbation of respiratory symptoms. She describes the need to increase the use of Advair  during the fall and spring seasons, often requiring two doses daily.  She otherwise typically needs 1 puff once a day.  She does prefer Advair  over the Symbicort  she used in the past.  the patient also uses a rescue inhaler, Albuterol  as needed.  She uses Azelastine  nasal spray, which she finds helpful for controlling nasal congestion and drainage.  She states the other medication she has handy is her eye drop that also helps.  The patient's symptoms are reportedly worse in certain environments, such as at school where she encounters mold and varying temperatures when she has had to move from room to room. To manage these symptoms, the patient keeps her Advair  inhaler, Azelastine , and eye drops on hand at school in her bag.  In addition to prescribed medications, the patient uses over-the-counter Mucinex when needed.  To help thin mucus and improve breathing. She also takes an allergy pill, Cetirizine , as needed. The patient has a preference for natural remedies and maintains a healthy diet, rich in fruits and vegetables. She also mentions the occasional use of small amount of brandy as a home remedy for respiratory symptoms.  The patient had a pacemaker inserted recently, the most significant medical intervention since her last visit. Despite her chronic conditions, the patient leads an active lifestyle, including regular travel to [place].  She is currently in the process of writing another book.    Review of systems: 10pt ROS negative unless noted above in HPI   All other systems negative unless noted above in HPI  Past medical/social/surgical/family history have been reviewed and are unchanged unless specifically indicated below.  No changes  Medication List: Current Outpatient Medications  Medication Sig Dispense Refill   ACCU-CHEK AVIVA PLUS test strip use to check blood sugar once daily as directed 100 strip 0   blood glucose meter kit and supplies KIT Dispense based on patient and insurance preference. Use up to four times daily as directed. (FOR ICD-9 250.00, 250.01). 1 each 0   cetirizine  (ZYRTEC ) 10 MG tablet Take 1 tablet (10 mg total) by mouth daily. 100 tablet 1   desoximetasone  (TOPICORT ) 0.25 % cream Apply 1 Application topically 2 (two) times daily. 30 g 2   diphenoxylate -atropine  (LOMOTIL ) 2.5-0.025 MG tablet Take 1 tablet by mouth 4 (four) times daily as needed for diarrhea or loose stools. 30 tablet 0   enalapril  (VASOTEC ) 2.5 MG tablet Take 1 tablet (2.5 mg total) by mouth daily. 90 tablet 1   EPINEPHrine  0.3 MG/0.3ML SOSY Use as directed for severe allergic reactions 2 each 1   fluticasone  (FLONASE ) 50 MCG/ACT nasal spray Place 2 sprays in each nostril once a day as needed for stuffy nose 16 g 5   Lancets 30G MISC Use as directed once daily to check blood sugar.  Diagnosis code E11.9 100 each 5   meloxicam  (MOBIC ) 7.5 MG tablet Take 1-2 tablets (7.5-15 mg total) by mouth daily as  needed for pain. 60 tablet 3   montelukast  (SINGULAIR ) 10 MG tablet Take 1 tablet (10 mg total) by mouth at bedtime. 90 tablet 5   Multiple Vitamins-Minerals (MULTIVITAMIN WITH MINERALS) tablet Take 1 tablet by mouth daily.     Omega-3 Fatty Acids (FISH OIL CONCENTRATE) 1000 MG CAPS Take by mouth 3 (three) times daily.     rosuvastatin  (CRESTOR ) 10 MG tablet Take 1 tablet (10 mg total) by mouth daily. 90 tablet 0    tacrolimus  (PROTOPIC ) 0.1 % ointment Apply 1 Application topically 2 (two) times daily. 100 g 2   albuterol  (PROVENTIL  HFA) 108 (90 Base) MCG/ACT inhaler Inhale 2 puffs into the lungs every 4 (four) hours as needed for wheezing or shortness of breath. 18 g 1   azelastine  (ASTELIN ) 0.1 % nasal spray Place 2 sprays into both nostrils 2 (two) times daily. 30 mL 5   fluticasone -salmeterol (ADVAIR  HFA) 115-21 MCG/ACT inhaler Inhale 2 puffs into the lungs 2 (two) times daily. 12 g 5   Olopatadine  HCl 0.2 % SOLN Place 1 drop in each eye once a day as needed for itchy watery eyes 2.5 mL 5   No current facility-administered medications for this visit.     Known medication allergies: Allergies  Allergen Reactions   Codeine Other (See Comments)    "makes me crazy"   Hornet Venom Anaphylaxis   Sulfa Antibiotics Anaphylaxis    "died 3 times".   Doxycycline  Other (See Comments)    "Yeast infection"   Oxycodone     Wound Dressing Adhesive    Amoxicillin  Rash     Physical examination: Blood pressure 124/82, pulse 89, temperature 98.2 F (36.8 C), temperature source Temporal, resp. rate 12, SpO2 99%.  General: Alert, interactive, in no acute distress. HEENT: PERRLA, TMs pearly gray, turbinates non-edematous without discharge, post-pharynx non erythematous. Neck: Supple without lymphadenopathy. Lungs: Clear to auscultation without wheezing, rhonchi or rales. {no increased work of breathing. CV: Normal S1, S2 without murmurs. Abdomen: Nondistended, nontender. Skin: Warm and dry, without lesions or rashes. Extremities:  No clubbing, cyanosis or edema. Neuro:   Grossly intact.  Diagnositics/Labs:  Spirometry: FEV1: 1.94L 19%, FVC: 2.93L 125% predicted.  Nonobstructive pattern  Assessment and plan: Moderate persistent asthma Continue Advair  115/21 mcg - taking 1-2 puffs twice a day with spacer to help prevent cough and wheeze May use albuterol  2 puffs every 4-6 hours as needed for  cough,wheeze, tightness in chest, or shortness of breath. May also use albuterol  2 puffs 5-15 minutes prior to exercise Asthma control goals:  Full participation in all desired activities (may need albuterol  before activity) Albuterol  use two time or less a week on average (not counting use with activity) Cough interfering with sleep two time or less a month Oral steroids no more than once a year No hospitalizations  Seasonal and perennial allergic rhinitis (grasses, weeds, mold, and dust mite) Continue allergen avoidance measures as listed below May use saline nasal rinses as needed for nasal symptoms. Use this before any medicated nasal sprays for best result Continue azelastine  nasal spray 2 sprays in each nostril twice a day to help with drainage/runny nose May use cetirizine  10 mg (or Allertec) once a day as needed for a runny nose  Allergic conjunctivitis Recommend Pataday  (olopatadine ) eye drops one drop in each eye once a day as needed for red or itchy eyes. If insurance does not cover this you can buy this over the counter.   Follow up in 1 year or sooner  if needed   I appreciate the opportunity to take part in [place] care. Please do not hesitate to contact me with questions.  Sincerely,   Catha Clink, MD Allergy/Immunology [facility]

## 2023-04-28 NOTE — Patient Instructions (Addendum)
 Moderate persistent asthma Continue Advair  115/21 mcg - taking 1-2 puffs twice a day with spacer to help prevent cough and wheeze May use albuterol  2 puffs every 4-6 hours as needed for cough,wheeze, tightness in chest, or shortness of breath. May also use albuterol  2 puffs 5-15 minutes prior to exercise Asthma control goals:  Full participation in all desired activities (may need albuterol  before activity) Albuterol  use two time or less a week on average (not counting use with activity) Cough interfering with sleep two time or less a month Oral steroids no more than once a year No hospitalizations  Seasonal and perennial allergic rhinitis (grasses, weeds, mold, and dust mite) Continue allergen avoidance measures as listed below May use saline nasal rinses as needed for nasal symptoms. Use this before any medicated nasal sprays for best result Continue azelastine  nasal spray 2 sprays in each nostril twice a day to help with drainage/runny nose May use cetirizine  10 mg (or Allertec) once a day as needed for a runny nose  Allergic conjunctivitis Recommend Pataday  (olopatadine ) eye drops one drop in each eye once a day as needed for red or itchy eyes. If insurance does not cover this you can buy this over the counter.   Follow up in 1 year or sooner if needed   Reducing Pollen Exposure The American Academy of Allergy, Asthma and Immunology suggests the following steps to reduce your exposure to pollen during allergy seasons. Do not hang sheets or clothing out to dry; pollen may collect on these items. Do not mow lawns or spend time around freshly cut grass; mowing stirs up pollen. Keep windows closed at night.  Keep car windows closed while driving. Minimize morning activities outdoors, a time when pollen counts are usually at their highest. Stay indoors as much as possible when pollen counts or humidity is high and on windy days when pollen tends to remain in the air longer. Use air  conditioning when possible.  Many air conditioners have filters that trap the pollen spores. Use a HEPA room air filter to remove pollen form the indoor air you breathe.  Control of Mold Allergen Mold and fungi can grow on a variety of surfaces provided certain temperature and moisture conditions exist.  Outdoor molds grow on plants, decaying vegetation and soil.  The major outdoor mold, Alternaria and Cladosporium, are found in very high numbers during hot and dry conditions.  Generally, a late Summer - Fall peak is seen for common outdoor fungal spores.  Rain will temporarily lower outdoor mold spore count, but counts rise rapidly when the rainy period ends.  The most important indoor molds are Aspergillus and Penicillium.  Dark, humid and poorly ventilated basements are ideal sites for mold growth.  The next most common sites of mold growth are the bathroom and the kitchen.  Outdoor [employer] Use air conditioning and keep windows closed Avoid exposure to decaying vegetation. Avoid leaf raking. Avoid grain handling. Consider wearing a face mask if working in moldy areas.  Indoor Mold Control Maintain humidity below 50%. Clean washable surfaces with 5% bleach solution. Remove sources e.g. Contaminated carpets.   Control of Dust Mite Allergen Dust mites play a major role in allergic asthma and rhinitis. They occur in environments with high humidity wherever human skin is found. Dust mites absorb humidity from the atmosphere (ie, they do not drink) and feed on organic matter (including shed human and animal skin). Dust mites are a microscopic type of insect that you cannot see  with the naked eye. High levels of dust mites have been detected from mattresses, pillows, carpets, upholstered furniture, bed covers, clothes, soft toys and any woven material. The principal allergen of the dust mite is found in its feces. A gram of dust may contain 1,000 mites and 250,000 fecal particles. Mite antigen  is easily measured in the air during house cleaning activities. Dust mites do not bite and do not cause harm to humans, other than by triggering allergies/asthma.  Ways to decrease your exposure to dust mites in your home:  1. Encase mattresses, box springs and pillows with a mite-impermeable barrier or cover  2. Wash sheets, blankets and drapes weekly in hot water (130 F) with detergent and dry them in a dryer on the hot setting.  3. Have the room cleaned frequently with a vacuum cleaner and a damp dust-mop. For carpeting or rugs, vacuuming with a vacuum cleaner equipped with a high-efficiency particulate air (HEPA) filter. The dust mite allergic individual should not be in a room which is being cleaned and should wait 1 hour after cleaning before going into the room.  4. Do not sleep on upholstered furniture (eg, couches). Use more  CBC abnormality needing option for follow-up with them on her trip and that she okay [employer] was sure that match.  So that there is something but does not really try to the trick okay the azelastine  spray is still  5. If possible removing carpeting, upholstered furniture and drapery from the home is ideal. Horizontal blinds should be eliminated in the rooms where the person spends the most time (bedroom, study, television room). Washable vinyl, roller-type shades are optimal.  6. Remove all non-washable stuffed toys from the bedroom. Wash stuffed toys weekly like sheets and blankets above.  7. Reduce indoor humidity to less than 50%. Inexpensive humidity monitors can be purchased at most hardware stores. Do not use a humidifier as can make the problem worse and are not recommended.

## 2023-05-11 DIAGNOSIS — H6121 Impacted cerumen, right ear: Secondary | ICD-10-CM | POA: Diagnosis not present

## 2023-05-12 ENCOUNTER — Other Ambulatory Visit (HOSPITAL_BASED_OUTPATIENT_CLINIC_OR_DEPARTMENT_OTHER): Payer: Self-pay

## 2023-05-16 NOTE — Assessment & Plan Note (Deleted)
 No recent exacerbation

## 2023-05-16 NOTE — Assessment & Plan Note (Deleted)
 hgba1c acceptable, minimize simple carbs. Increase exercise as tolerated. Continue current meds

## 2023-05-16 NOTE — Assessment & Plan Note (Deleted)
 Encourage heart healthy diet such as MIND or DASH diet, increase exercise, avoid trans fats, simple carbohydrates and processed foods, consider a krill or fish or flaxseed oil cap daily. Tolerating Rosuvastatin

## 2023-05-16 NOTE — Assessment & Plan Note (Deleted)
Has moved down to overweight category, continue to stay active and maintain a heart healthy diet

## 2023-05-17 DIAGNOSIS — Z950 Presence of cardiac pacemaker: Secondary | ICD-10-CM | POA: Diagnosis not present

## 2023-05-17 DIAGNOSIS — I442 Atrioventricular block, complete: Secondary | ICD-10-CM | POA: Diagnosis not present

## 2023-05-20 ENCOUNTER — Ambulatory Visit: Payer: Medicare PPO | Admitting: Family Medicine

## 2023-05-20 DIAGNOSIS — E1169 Type 2 diabetes mellitus with other specified complication: Secondary | ICD-10-CM

## 2023-05-20 DIAGNOSIS — E663 Overweight: Secondary | ICD-10-CM

## 2023-05-20 DIAGNOSIS — E782 Mixed hyperlipidemia: Secondary | ICD-10-CM

## 2023-05-20 DIAGNOSIS — J4520 Mild intermittent asthma, uncomplicated: Secondary | ICD-10-CM

## 2023-05-21 ENCOUNTER — Other Ambulatory Visit (HOSPITAL_BASED_OUTPATIENT_CLINIC_OR_DEPARTMENT_OTHER): Payer: Self-pay

## 2023-06-06 NOTE — Assessment & Plan Note (Signed)
 Encourage heart healthy diet such as MIND or DASH diet, increase exercise, avoid trans fats, simple carbohydrates and processed foods, consider a krill or fish or flaxseed oil cap daily. Tolerating Rosuvastatin

## 2023-06-06 NOTE — Assessment & Plan Note (Signed)
Avoid offending foods, start probiotics. Do not eat large meals in late evening and consider raising head of bed.  

## 2023-06-06 NOTE — Assessment & Plan Note (Signed)
 hgba1c acceptable, minimize simple carbs. Increase exercise as tolerated. Continue current meds

## 2023-06-06 NOTE — Assessment & Plan Note (Signed)
 No recent exacerbation

## 2023-06-08 ENCOUNTER — Telehealth: Payer: Medicare PPO | Admitting: Family Medicine

## 2023-06-10 ENCOUNTER — Other Ambulatory Visit: Payer: Self-pay

## 2023-06-10 ENCOUNTER — Telehealth: Payer: Self-pay | Admitting: Family Medicine

## 2023-06-10 ENCOUNTER — Encounter: Payer: Self-pay | Admitting: Family Medicine

## 2023-06-10 ENCOUNTER — Ambulatory Visit: Payer: Medicare PPO | Admitting: Family Medicine

## 2023-06-10 ENCOUNTER — Other Ambulatory Visit (HOSPITAL_BASED_OUTPATIENT_CLINIC_OR_DEPARTMENT_OTHER): Payer: Self-pay

## 2023-06-10 VITALS — BP 120/78 | HR 65 | Temp 98.4°F | Resp 16 | Wt 186.6 lb

## 2023-06-10 DIAGNOSIS — E669 Obesity, unspecified: Secondary | ICD-10-CM | POA: Diagnosis not present

## 2023-06-10 DIAGNOSIS — K219 Gastro-esophageal reflux disease without esophagitis: Secondary | ICD-10-CM

## 2023-06-10 DIAGNOSIS — E1169 Type 2 diabetes mellitus with other specified complication: Secondary | ICD-10-CM

## 2023-06-10 DIAGNOSIS — J4520 Mild intermittent asthma, uncomplicated: Secondary | ICD-10-CM

## 2023-06-10 DIAGNOSIS — E782 Mixed hyperlipidemia: Secondary | ICD-10-CM

## 2023-06-10 MED ORDER — CLINDAMYCIN HCL 300 MG PO CAPS
300.0000 mg | ORAL_CAPSULE | Freq: Three times a day (TID) | ORAL | 0 refills | Status: DC
  Filled 2023-06-10: qty 21, 7d supply, fill #0

## 2023-06-10 MED ORDER — ENALAPRIL MALEATE 5 MG PO TABS
5.0000 mg | ORAL_TABLET | Freq: Every day | ORAL | 1 refills | Status: DC
Start: 2023-06-10 — End: 2023-10-22
  Filled 2023-06-10: qty 90, 90d supply, fill #0

## 2023-06-10 NOTE — Patient Instructions (Signed)

## 2023-06-10 NOTE — Telephone Encounter (Signed)
 Copied from CRM [id number]. Topic: Appointments - Scheduling Inquiry for Clinic >> Jun 10, 2023  4:03 PM Deaijah H wrote: Reason for CRM: Patient would like to let Dr. Abner Greenspan know she scheduled her lab appointment and would like to like to know when Dr. Abner Greenspan would like to see her again. If needed, please call [phone]

## 2023-06-13 NOTE — Progress Notes (Signed)
 Subjective:    Patient ID: Vicki Dennis, female    DOB: [birth year], 78 y.o.   MRN: [id number]  Chief Complaint  Patient presents with   Follow-up    HPI Discussed the use of AI scribe software for clinical note transcription with the patient, who gave verbal consent to proceed.  History of Present Illness    Vicki Dennis is an 78 year old female with hypertension who presents with elevated blood pressure and a possible infection.  She has recently started on enalapril 2.5 mg for hypertension. Her blood pressure upon arrival ranged from 130 to 150 mmHg, which she can reduce by resting and using a mantra. She monitors her blood pressure at home and notes a decrease of at least ten points with rest.  She is experiencing symptoms of an infection, including discomfort and swelling in a specific area, which began two days ago. She recalls a similar infection in the past that required clindamycin treatment. Allergies to amoxicillin and sulfa drugs limit her antibiotic options.  She maintains a balanced diet and recognizes the need to eat when feeling weak, possibly due to low blood sugar. She ensures adequate hydration.  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 She is concerned about funding cuts affecting her ability to assist these children. She also shares personal stressors, including her work schedule and the impact of current events on her well-being.    Past Medical History:  Diagnosis Date   Allergy    Anemia    h/o   Asthma    Asthma, mild intermittent, well-controlled 02/10/2014   Blood transfusion without reported diagnosis    Chicken pox as a child   Diabetes mellitus    type-II, per Dr. Parke Simmers, no meds. , pt. reports weight loss has contributed  to control of diabetes.  She reports last HgbA1c- wnl.   Diabetes mellitus 11/05/2010   Diabetes mellitus type 2 in obese 11/05/2010   Diabetes  mellitus type 2, uncontrolled 11/05/2010   Esophageal reflux 02/10/2014   GERD (gastroesophageal reflux disease)    rare use of OTC aid   Hyperglycemia 09/02/2015   Hyperlipidemia    Hypertension    Measles as a child   Measles    Medicare annual wellness visit, subsequent 08/06/2014   Mumps as a child   Obesity 02/10/2014   Pneumonia    as a child   Portal vein thrombosis 03/09/2011   coumadin ended summer of 2013   Preventative health care 08/12/2014   Rash and nonspecific skin eruption 01/07/2015   Seborrheic keratosis 02/10/2014   Follows with dermatology   Sinusitis, acute 03/23/2016    Past Surgical History:  Procedure Laterality Date   BREAST BIOPSY Bilateral    INSERTION OF MESH  03/16/2012   Procedure: INSERTION OF MESH;  Surgeon: Shelly Rubenstein, MD;  Location: [facility] OR;  Service: General;  Laterality: N/A;   KNEE ARTHROSCOPY W/ MENISCAL REPAIR     both knees   Pace maker     TONSILLECTOMY     UMBILICAL HERNIA REPAIR  03/16/2012   Procedure: HERNIA REPAIR UMBILICAL ADULT;  Surgeon: Shelly Rubenstein, MD;  Location: [facility] OR;  Service: General;  Laterality: N/A;    Family History  Problem Relation Age of Onset   Cancer Father        prostate   Aneurysm Mother 4       brain   Cancer  Maternal Aunt     Social History   Socioeconomic History   Marital status: Divorced    Spouse name: Not on file   Number of children: Not on file   Years of education: Not on file   Highest education level: Not on file  Occupational History   Not on file  Tobacco Use   Smoking status: Never   Smokeless tobacco: Never   Tobacco comments:    never used tobacco  Vaping Use   Vaping status: Never Used  Substance and Sexual Activity   Alcohol use: Yes    Alcohol/week: 0.0 standard drinks of alcohol    Comment: for holidays   Drug use: No   Sexual activity: Not Currently    Comment: lives alone   Other Topics Concern   Not on file  Social History Narrative   Retired  Runner, broadcasting/film/video   Social Drivers of Corporate investment banker Strain: Low Risk  (02/16/2023)   Received from [facility]   Overall Financial Resource Strain (CARDIA)    Difficulty of Paying Living Expenses: Not very hard  Food Insecurity: No Food Insecurity (02/16/2023)   Received from [facility]   Hunger Vital Sign    Worried About Running Out of Food in the Last Year: Never true    Ran Out of Food in the Last Year: Never true  Transportation Needs: No Transportation Needs (02/16/2023)   Received from [facility] - [facility]    Lack of Transportation (Medical): No    Lack of Transportation (Non-Medical): No  Physical Activity: Insufficiently Active (02/09/2023)   Exercise Vital Sign    Days of Exercise per Week: 2 days    Minutes of Exercise per Session: 60 min  Stress: No Stress Concern Present (02/09/2023)   [employer] of Occupational Health - Occupational Stress Questionnaire    Feeling of Stress : Only a little  Social Connections: Moderately Integrated (02/09/2023)   Social Connection and Isolation Panel [NHANES]    Frequency of Communication with Friends and Family: Three times a week    Frequency of Social Gatherings with Friends and Family: More than three times a week    Attends Religious Services: More than 4 times per year    Active Member of [employer] or Organizations: Yes    Attends Engineer, structural: More than 4 times per year    Marital Status: Divorced  Intimate Partner Violence: Not At Risk (02/09/2023)   Humiliation, Afraid, Rape, and Kick questionnaire    Fear of Current or Ex-Partner: No    Emotionally Abused: No    Physically Abused: No    Sexually Abused: No    Outpatient Medications Prior to Visit  Medication Sig Dispense Refill   ACCU-CHEK AVIVA PLUS test strip use to check blood sugar once daily as directed 100 strip 0   albuterol (PROVENTIL HFA) 108 (90 Base) MCG/ACT inhaler Inhale 2 puffs into the lungs every 4 (four)  hours as needed for wheezing or shortness of breath. 18 g 1   azelastine (ASTELIN) 0.1 % nasal spray Place 2 sprays into both nostrils 2 (two) times daily. 30 mL 5   blood glucose meter kit and supplies KIT Dispense based on patient and insurance preference. Use up to four times daily as directed. (FOR ICD-9 250.00, 250.01). 1 each 0   cetirizine (ZYRTEC) 10 MG tablet Take 1 tablet (10 mg total) by mouth daily. 100 tablet 1   desoximetasone (TOPICORT) 0.25 % cream  Apply 1 Application topically 2 (two) times daily. 30 g 2   diphenoxylate-atropine (LOMOTIL) 2.5-0.025 MG tablet Take 1 tablet by mouth 4 (four) times daily as needed for diarrhea or loose stools. 30 tablet 0   EPINEPHrine 0.3 MG/0.3ML SOSY Use as directed for severe allergic reactions 2 each 1   fluticasone (FLONASE) 50 MCG/ACT nasal spray Place 2 sprays in each nostril once a day as needed for stuffy nose 16 g 5   fluticasone-salmeterol (ADVAIR HFA) 115-21 MCG/ACT inhaler Inhale 2 puffs into the lungs 2 (two) times daily. 12 g 5   Lancets 30G MISC Use as directed once daily to check blood sugar.  Diagnosis code E11.9 100 each 5   meloxicam (MOBIC) 7.5 MG tablet Take 1-2 tablets (7.5-15 mg total) by mouth daily as needed for pain. 60 tablet 3   montelukast (SINGULAIR) 10 MG tablet Take 1 tablet (10 mg total) by mouth at bedtime. 90 tablet 5   Multiple Vitamins-Minerals (MULTIVITAMIN WITH MINERALS) tablet Take 1 tablet by mouth daily.     Olopatadine HCl 0.2 % SOLN Place 1 drop in each eye once a day as needed for itchy watery eyes 2.5 mL 5   Omega-3 Fatty Acids (FISH OIL CONCENTRATE) 1000 MG CAPS Take by mouth 3 (three) times daily.     rosuvastatin (CRESTOR) 10 MG tablet Take 1 tablet (10 mg total) by mouth daily. 90 tablet 0   tacrolimus (PROTOPIC) 0.1 % ointment Apply 1 Application topically 2 (two) times daily. 100 g 2   enalapril (VASOTEC) 2.5 MG tablet Take 1 tablet (2.5 mg total) by mouth daily. 90 tablet 1   No  facility-administered medications prior to visit.    Allergies  Allergen Reactions   Codeine Other (See Comments)    "makes me crazy"   Hornet Venom Anaphylaxis   Sulfa Antibiotics Anaphylaxis    "died 3 times".   Doxycycline Other (See Comments)    "Yeast infection"   Oxycodone    Wound Dressing Adhesive    Amoxicillin Rash    Review of Systems  Constitutional:  Positive for malaise/fatigue. Negative for fever.  HENT:  Negative for congestion.   Eyes:  Negative for blurred vision.  Respiratory:  Negative for shortness of breath.   Cardiovascular:  Negative for chest pain, palpitations and leg swelling.  Gastrointestinal:  Negative for abdominal pain, blood in stool and nausea.  Genitourinary:  Negative for dysuria and frequency.  Musculoskeletal:  Positive for joint pain. Negative for falls.  Skin:  Negative for rash.  Neurological:  Negative for dizziness, loss of consciousness and headaches.  Endo/Heme/Allergies:  Negative for environmental allergies.  Psychiatric/Behavioral:  Negative for depression. The patient is not nervous/anxious.        Objective:    Physical Exam Constitutional:      General: She is not in acute distress.    Appearance: Normal appearance. She is well-developed. She is not toxic-appearing.  HENT:     Head: Normocephalic and atraumatic.     Right Ear: External ear normal.     Left Ear: External ear normal.     Nose: Nose normal.  Eyes:     General:        Right eye: No discharge.        Left eye: No discharge.     Conjunctiva/sclera: Conjunctivae normal.  Neck:     Thyroid: No thyromegaly.  Cardiovascular:     Rate and Rhythm: Normal rate and regular rhythm.  Heart sounds: Normal heart sounds. No murmur heard. Pulmonary:     Effort: Pulmonary effort is normal. No respiratory distress.     Breath sounds: Normal breath sounds.  Abdominal:     General: Bowel sounds are normal.     Palpations: Abdomen is soft.     Tenderness: There  is no abdominal tenderness. There is no guarding.  Musculoskeletal:        General: Normal range of motion.     Cervical back: Neck supple.  Lymphadenopathy:     Cervical: No cervical adenopathy.  Skin:    General: Skin is warm and dry.  Neurological:     Mental Status: She is alert and oriented to person, place, and time.  Psychiatric:        Mood and Affect: Mood normal.        Behavior: Behavior normal.        Thought Content: Thought content normal.        Judgment: Judgment normal.     BP (!) 150/90 (BP Location: Left Arm, Patient Position: Sitting, Cuff Size: Normal)   Pulse 65   Temp 98.4 F (36.9 C) (Oral)   Resp 16   Wt 186 lb 9.6 oz (84.6 kg)   SpO2 98%   BMI 29.23 kg/m  Wt Readings from Last 3 Encounters:  06/10/23 186 lb 9.6 oz (84.6 kg)  02/09/23 182 lb (82.6 kg)  11/17/22 184 lb (83.5 kg)    Diabetic Foot Exam - Simple   No data filed    Lab Results  Component Value Date   WBC 6.8 03/16/2023   HGB 13.3 03/16/2023   HCT 41.4 03/16/2023   PLT 207.0 03/16/2023   GLUCOSE 121 (H) 03/24/2023   CHOL 155 03/24/2023   TRIG 64.0 03/24/2023   HDL 67.50 03/24/2023   LDLDIRECT 111.2 02/06/2014   LDLCALC 74 03/24/2023   ALT 12 03/24/2023   AST 14 03/24/2023   NA 140 03/24/2023   K 4.3 03/24/2023   CL 106 03/24/2023   CREATININE 0.71 03/24/2023   BUN 19 03/24/2023   CO2 27 03/24/2023   TSH 1.06 03/24/2023   INR 2.2 09/21/2011   HGBA1C 7.1 (H) 03/16/2023   MICROALBUR 2.6 (H) 03/24/2023    Lab Results  Component Value Date   TSH 1.06 03/24/2023   Lab Results  Component Value Date   WBC 6.8 03/16/2023   HGB 13.3 03/16/2023   HCT 41.4 03/16/2023   MCV 89.5 03/16/2023   PLT 207.0 03/16/2023   Lab Results  Component Value Date   NA 140 03/24/2023   K 4.3 03/24/2023   CHLORIDE 110 (H) 10/10/2015   CO2 27 03/24/2023   GLUCOSE 121 (H) 03/24/2023   BUN 19 03/24/2023   CREATININE 0.71 03/24/2023   BILITOT 0.6 03/24/2023   ALKPHOS 76 03/24/2023    AST 14 03/24/2023   ALT 12 03/24/2023   PROT 6.2 03/24/2023   ALBUMIN 4.0 03/24/2023   CALCIUM 9.1 03/24/2023   ANIONGAP 8 10/10/2015   EGFR 86 (L) 10/10/2015   GFR 80.64 03/24/2023   Lab Results  Component Value Date   CHOL 155 03/24/2023   Lab Results  Component Value Date   HDL 67.50 03/24/2023   Lab Results  Component Value Date   LDLCALC 74 03/24/2023   Lab Results  Component Value Date   TRIG 64.0 03/24/2023   Lab Results  Component Value Date   CHOLHDL 2 03/24/2023   Lab Results  Component Value  Date   HGBA1C 7.1 (H) 03/16/2023       Assessment & Plan:  Asthma, mild intermittent, well-controlled Assessment & Plan: No recent exacerbation   Gastroesophageal reflux disease without esophagitis Assessment & Plan: Avoid offending foods, start probiotics. Do not eat large meals in late evening and consider raising head of bed.   Orders: -     CBC with Differential/Platelet; Future -     TSH; Future  Mixed hyperlipidemia Assessment & Plan: Encourage heart healthy diet such as MIND or DASH diet, increase exercise, avoid trans fats, simple carbohydrates and processed foods, consider a krill or fish or flaxseed oil cap daily. Tolerating Rosuvastatin  Orders: -     Lipid panel; Future -     CBC with Differential/Platelet; Future -     TSH; Future  Type 2 diabetes mellitus with obesity (HCC) Assessment & Plan: hgba1c acceptable, minimize simple carbs. Increase exercise as tolerated. Continue current meds   Orders: -     Comprehensive metabolic panel; Future -     Hemoglobin A1c; Future -     TSH; Future -     Microalbumin / creatinine urine ratio; Future  Other orders -     Clindamycin HCl; Take 1 capsule (300 mg total) by mouth 3 (three) times daily.  Dispense: 21 capsule; Refill: 0 -     Enalapril Maleate; Take 1 tablet (5 mg total) by mouth daily.  Dispense: 90 tablet; Refill: 1    Assessment and Plan Assessment & Plan Hypertension Elevated  blood pressure readings, possibly related to stress. Patient reports some improvement with rest and meditation. -Increase Enalapril from 2.5mg  to 5mg  daily. -Advise patient to continue stress management techniques. -Check blood pressure at home and report readings in a couple of weeks.  Dental Infection Patient reports a dental infection that started two days ago. -Prescribe Clindamycin due to patient's allergy to Amoxicillin. -Advise patient to take a probiotic and minimize carbohydrates while on Clindamycin to prevent secondary yeast infection.  General Health Maintenance / Followup Plans -Plan for lab work in about ten days. -Schedule follow-up appointments for four months from now and for annual physical. -Encourage patient to continue healthy eating habits and hydration.     Danise Edge, MD

## 2023-06-14 ENCOUNTER — Encounter: Payer: Self-pay | Admitting: Emergency Medicine

## 2023-06-14 NOTE — Telephone Encounter (Signed)
 Copied from CRM [id number]. Topic: Clinical - Medical Advice >> Jun 14, 2023  4:13 PM Tiffany H wrote: Reason for CRM: Patient called to schedule 6 month follow up. Patient is scheduled for 01/10/24 per providers instruction. Patient would like to know why visits every 3 months are no longer necessary. Please assist.

## 2023-06-14 NOTE — Telephone Encounter (Signed)
 Called patient. No answer. LVM Will also send a message through [employer]

## 2023-06-16 NOTE — Telephone Encounter (Signed)
 Called and spoke with patient. She thought provider was trying to get rid of her. Explained to patient that wasn't the case

## 2023-06-21 ENCOUNTER — Encounter: Payer: Self-pay | Admitting: Family Medicine

## 2023-06-21 ENCOUNTER — Other Ambulatory Visit (INDEPENDENT_AMBULATORY_CARE_PROVIDER_SITE_OTHER): Payer: Medicare PPO

## 2023-06-21 ENCOUNTER — Other Ambulatory Visit (HOSPITAL_BASED_OUTPATIENT_CLINIC_OR_DEPARTMENT_OTHER): Payer: Self-pay

## 2023-06-21 DIAGNOSIS — E669 Obesity, unspecified: Secondary | ICD-10-CM | POA: Diagnosis not present

## 2023-06-21 DIAGNOSIS — E782 Mixed hyperlipidemia: Secondary | ICD-10-CM | POA: Diagnosis not present

## 2023-06-21 DIAGNOSIS — K219 Gastro-esophageal reflux disease without esophagitis: Secondary | ICD-10-CM

## 2023-06-21 DIAGNOSIS — E1169 Type 2 diabetes mellitus with other specified complication: Secondary | ICD-10-CM | POA: Diagnosis not present

## 2023-06-21 LAB — LIPID PANEL
Cholesterol: 149 mg/dL (ref 0–200)
HDL: 71.1 mg/dL (ref >39.00)
LDL Cholesterol: 62 mg/dL (ref 0–99)
NonHDL: 77.56
Total CHOL/HDL Ratio: 2
Triglycerides: 78 mg/dL (ref 0.0–149.0)
VLDL: 15.6 mg/dL (ref 0.0–40.0)

## 2023-06-21 LAB — CBC WITH DIFFERENTIAL/PLATELET
Basophils Absolute: 0.1 10*3/uL (ref 0.0–0.1)
Basophils Relative: 1.2 % (ref 0.0–3.0)
Eosinophils Absolute: 0.3 10*3/uL (ref 0.0–0.7)
Eosinophils Relative: 3.8 % (ref 0.0–5.0)
HCT: 41.8 % (ref 36.0–46.0)
Hemoglobin: 13.7 g/dL (ref 12.0–15.0)
Lymphocytes Relative: 13.8 % (ref 12.0–46.0)
Lymphs Abs: 0.9 10*3/uL (ref 0.7–4.0)
MCHC: 32.7 g/dL (ref 30.0–36.0)
MCV: 90.5 fl (ref 78.0–100.0)
Monocytes Absolute: 0.7 10*3/uL (ref 0.1–1.0)
Monocytes Relative: 10.3 % (ref 3.0–12.0)
Neutro Abs: 4.8 10*3/uL (ref 1.4–7.7)
Neutrophils Relative %: 70.9 % (ref 43.0–77.0)
Platelets: 184 10*3/uL (ref 150.0–400.0)
RBC: 4.62 Mil/uL (ref 3.87–5.11)
RDW: 14.2 % (ref 11.5–15.5)
WBC: 6.7 10*3/uL (ref 4.0–10.5)

## 2023-06-21 LAB — COMPREHENSIVE METABOLIC PANEL
ALT: 11 U/L (ref 0–35)
AST: 14 U/L (ref 0–37)
Albumin: 4.1 g/dL (ref 3.5–5.2)
Alkaline Phosphatase: 77 U/L (ref 39–117)
BUN: 18 mg/dL (ref 6–23)
CO2: 25 meq/L (ref 19–32)
Calcium: 9.3 mg/dL (ref 8.4–10.5)
Chloride: 105 meq/L (ref 96–112)
Creatinine, Ser: 0.76 mg/dL (ref 0.40–1.20)
GFR: 74.19 mL/min (ref >60.00)
Glucose, Bld: 109 mg/dL — ABNORMAL HIGH (ref 70–99)
Potassium: 4.3 meq/L (ref 3.5–5.1)
Sodium: 141 meq/L (ref 135–145)
Total Bilirubin: 0.6 mg/dL (ref 0.2–1.2)
Total Protein: 6.1 g/dL (ref 6.0–8.3)

## 2023-06-21 LAB — HEMOGLOBIN A1C: Hgb A1c MFr Bld: 7.1 % — ABNORMAL HIGH (ref 4.6–6.5)

## 2023-06-21 LAB — TSH: TSH: 0.91 u[IU]/mL (ref 0.35–5.50)

## 2023-06-23 ENCOUNTER — Other Ambulatory Visit (INDEPENDENT_AMBULATORY_CARE_PROVIDER_SITE_OTHER)

## 2023-06-23 ENCOUNTER — Encounter: Payer: Self-pay | Admitting: Family Medicine

## 2023-06-23 DIAGNOSIS — E669 Obesity, unspecified: Secondary | ICD-10-CM | POA: Diagnosis not present

## 2023-06-23 DIAGNOSIS — E1169 Type 2 diabetes mellitus with other specified complication: Secondary | ICD-10-CM

## 2023-06-23 LAB — MICROALBUMIN / CREATININE URINE RATIO
Creatinine,U: 60.1 mg/dL
Microalb Creat Ratio: 12.6 mg/g (ref 0.0–30.0)
Microalb, Ur: 0.8 mg/dL (ref 0.0–1.9)

## 2023-06-23 NOTE — Addendum Note (Signed)
 Addended by: Mervin Kung A on: 06/23/2023 01:45 PM   Modules accepted: Orders

## 2023-07-09 ENCOUNTER — Telehealth: Payer: Self-pay

## 2023-07-09 NOTE — Telephone Encounter (Signed)
 Patient needs to know if hernia needs to be checked by xray

## 2023-07-09 NOTE — Telephone Encounter (Signed)
 Copied from CRM [id number]. Topic: Clinical - Medical Advice >> Jul 09, 2023 11:13 AM Denese Killings wrote: Reason for CRM: Patient cant access chart at work. Patient has a questions for Dr. Abner Greenspan regarding her hernia. She wants to know if it needs to be check/xray. Patient is requesting a callback.

## 2023-07-12 ENCOUNTER — Other Ambulatory Visit: Payer: Self-pay | Admitting: Family Medicine

## 2023-07-12 DIAGNOSIS — K429 Umbilical hernia without obstruction or gangrene: Secondary | ICD-10-CM

## 2023-07-12 DIAGNOSIS — R109 Unspecified abdominal pain: Secondary | ICD-10-CM

## 2023-07-12 DIAGNOSIS — D1803 Hemangioma of intra-abdominal structures: Secondary | ICD-10-CM

## 2023-07-12 NOTE — Telephone Encounter (Signed)
 Called and spoke with patient. She just wants it looked at to make sure everything is in tact. She said it's for psychological purposes. If she travels she wants to make sure she will be alright. She does not want surgery.

## 2023-07-13 ENCOUNTER — Encounter: Payer: Self-pay | Admitting: Emergency Medicine

## 2023-07-14 ENCOUNTER — Encounter: Payer: Self-pay | Admitting: Emergency Medicine

## 2023-07-14 ENCOUNTER — Telehealth: Payer: Self-pay | Admitting: Emergency Medicine

## 2023-07-14 NOTE — Telephone Encounter (Signed)
 Copied from CRM [id number]. Topic: Appointments - Scheduling Inquiry for Clinic >> Jul 14, 2023  1:17 PM [place] J wrote: Reason for CRM: Patient needs to schedule imaging appointment.

## 2023-07-18 NOTE — Assessment & Plan Note (Signed)
 Awaiting ultrasound.

## 2023-07-18 NOTE — Assessment & Plan Note (Signed)
 Encourage heart healthy diet such as MIND or DASH diet, increase exercise, avoid trans fats, simple carbohydrates and processed foods, consider a krill or fish or flaxseed oil cap daily. Tolerating Rosuvastatin

## 2023-07-18 NOTE — Assessment & Plan Note (Signed)
 hgba1c acceptable, minimize simple carbs. Increase exercise as tolerated. Continue current meds

## 2023-07-20 ENCOUNTER — Telehealth (INDEPENDENT_AMBULATORY_CARE_PROVIDER_SITE_OTHER): Admitting: Family Medicine

## 2023-07-20 ENCOUNTER — Encounter: Payer: Self-pay | Admitting: Family Medicine

## 2023-07-20 DIAGNOSIS — E1169 Type 2 diabetes mellitus with other specified complication: Secondary | ICD-10-CM

## 2023-07-20 DIAGNOSIS — E782 Mixed hyperlipidemia: Secondary | ICD-10-CM | POA: Diagnosis not present

## 2023-07-20 DIAGNOSIS — K429 Umbilical hernia without obstruction or gangrene: Secondary | ICD-10-CM | POA: Diagnosis not present

## 2023-07-20 DIAGNOSIS — K219 Gastro-esophageal reflux disease without esophagitis: Secondary | ICD-10-CM

## 2023-07-20 DIAGNOSIS — E669 Obesity, unspecified: Secondary | ICD-10-CM

## 2023-07-21 ENCOUNTER — Other Ambulatory Visit: Payer: Self-pay | Admitting: Family Medicine

## 2023-07-21 DIAGNOSIS — K429 Umbilical hernia without obstruction or gangrene: Secondary | ICD-10-CM

## 2023-07-21 NOTE — Progress Notes (Signed)
 MyChart Video Visit    Virtual Visit via Video Note   This patient is at least at moderate risk for complications without adequate follow up. This format is felt to be most appropriate for this patient at this time. Physical exam was limited by quality of the video and audio technology used for the visit. Juanetta, CMA was able to get the patient set up on a video visit.  Patient location: home Patient and provider in visit Provider location: Office  I discussed the limitations of evaluation and management by telemedicine and the availability of in person appointments. The patient expressed understanding and agreed to proceed.  Visit Date: 07/20/2023  Today's healthcare provider: Danise Edge, MD  Subjective:    Patient ID: Vicki Dennis, female    DOB: [birth year], 78 y.o.   MRN: [id number]  Chief Complaint  Patient presents with  . Hernia    HPI Discussed the use of AI scribe software for clinical note transcription with the patient, who gave verbal consent to proceed.  History of Present Illness The patient is an 78 year old who presents for medication management and follow-up.  She is currently taking medication for hyperlipidemia and is considering adding CoQ10 to her regimen to address the metabolic byproducts of her cholesterol medication.  She experiences significant allergic rhinitis, particularly due to pollen, which is severe enough to prevent her from going out over the weekend. Her current allergy medications include Astelin, Flonase, Zyrtec, Singulair, and eye drops. She is considering increasing Zyrtec to twice a day during severe allergy periods.  She has a history of diabetes and is curious about the relationship between viral infections and the onset of diabetes. She is managing her diabetes with current medications and is interested in understanding more about potential viral triggers.  She has a hernia that she describes as 'embedded in fat' and is awaiting an  upcoming appointment to assess its status. She is concerned about the hernia's impact on her ability to engage in activities like kayaking.  She is scheduled for lab work in June to monitor her health status, with a follow-up virtual visit planned to discuss the results.    Past Medical History:  Diagnosis Date  . Allergy   . Anemia    h/o  . Asthma   . Asthma, mild intermittent, well-controlled 02/10/2014  . Blood transfusion without reported diagnosis   . Chicken pox as a child  . Diabetes mellitus    type-II, per Dr. Parke Simmers, no meds. , pt. reports weight loss has contributed  to control of diabetes.  She reports last HgbA1c- wnl.  . Diabetes mellitus 11/05/2010  . Diabetes mellitus type 2 in obese 11/05/2010  . Diabetes mellitus type 2, uncontrolled 11/05/2010  . Esophageal reflux 02/10/2014  . GERD (gastroesophageal reflux disease)    rare use of OTC aid  . Hyperglycemia 09/02/2015  . Hyperlipidemia   . Hypertension   . Measles as a child  . Measles   . Medicare annual wellness visit, subsequent 08/06/2014  . Mumps as a child  . Obesity 02/10/2014  . Pneumonia    as a child  . Portal vein thrombosis 03/09/2011   coumadin ended summer of 2013  . Preventative health care 08/12/2014  . Rash and nonspecific skin eruption 01/07/2015  . Seborrheic keratosis 02/10/2014   Follows with dermatology  . Sinusitis, acute 03/23/2016    Past Surgical History:  Procedure Laterality Date  . BREAST BIOPSY Bilateral   . INSERTION OF MESH  03/16/2012   Procedure: INSERTION OF MESH;  Surgeon: Shelly Rubenstein, MD;  Location: [facility] OR;  Service: General;  Laterality: N/A;  . KNEE ARTHROSCOPY W/ MENISCAL REPAIR     both knees  . Visual merchandiser    . TONSILLECTOMY    . UMBILICAL HERNIA REPAIR  03/16/2012   Procedure: HERNIA REPAIR UMBILICAL ADULT;  Surgeon: Shelly Rubenstein, MD;  Location: [facility] OR;  Service: General;  Laterality: N/A;    Family History  Problem Relation Age of Onset  . Cancer  Father        prostate  . Aneurysm Mother 53       brain  . Cancer Maternal Aunt     Social History   Socioeconomic History  . Marital status: Divorced    Spouse name: Not on file  . Number of children: Not on file  . Years of education: Not on file  . Highest education level: Master's degree (e.g., MA, MS, MEng, MEd, MSW, MBA)  Occupational History  . Not on file  Tobacco Use  . Smoking status: Never  . Smokeless tobacco: Never  . Tobacco comments:    never used tobacco  Vaping Use  . Vaping status: Never Used  Substance and Sexual Activity  . Alcohol use: Yes    Alcohol/week: 0.0 standard drinks of alcohol    Comment: for holidays  . Drug use: No  . Sexual activity: Not Currently    Comment: lives alone   Other Topics Concern  . Not on file  Social History Narrative   Retired Runner, broadcasting/film/video   Social Drivers of Health   Financial Resource Strain: Low Risk  (07/19/2023)   Overall Financial Resource Strain (CARDIA)   . Difficulty of Paying Living Expenses: Not very hard  Food Insecurity: Food Insecurity Present (07/19/2023)   Hunger Vital Sign   . Worried About Programme researcher, broadcasting/film/video in the Last Year: Sometimes true   . Ran Out of Food in the Last Year: Never true  Transportation Needs: No Transportation Needs (07/19/2023)   PRAPARE - Transportation   . Lack of Transportation (Medical): No   . Lack of Transportation (Non-Medical): No  Physical Activity: Sufficiently Active (07/19/2023)   Exercise Vital Sign   . Days of Exercise per Week: 5 days   . Minutes of Exercise per Session: 60 min  Stress: No Stress Concern Present (07/19/2023)   [employer] of Occupational Health - Occupational Stress Questionnaire   . Feeling of Stress : Only a little  Social Connections: Moderately Integrated (07/19/2023)   Social Connection and Isolation Panel [NHANES]   . Frequency of Communication with Friends and Family: More than three times a week   . Frequency of Social Gatherings with  Friends and Family: Patient declined   . Attends Religious Services: More than 4 times per year   . Active Member of Clubs or Organizations: Yes   . Attends Banker Meetings: Patient declined   . Marital Status: Divorced  Catering manager Violence: Not At Risk (02/09/2023)   Humiliation, Afraid, Rape, and Kick questionnaire   . Fear of Current or Ex-Partner: No   . Emotionally Abused: No   . Physically Abused: No   . Sexually Abused: No    Outpatient Medications Prior to Visit  Medication Sig Dispense Refill  . ACCU-CHEK AVIVA PLUS test strip use to check blood sugar once daily as directed 100 strip 0  . albuterol (PROVENTIL HFA) 108 (90 Base) MCG/ACT inhaler Inhale 2  puffs into the lungs every 4 (four) hours as needed for wheezing or shortness of breath. 18 g 1  . azelastine (ASTELIN) 0.1 % nasal spray Place 2 sprays into both nostrils 2 (two) times daily. 30 mL 5  . blood glucose meter kit and supplies KIT Dispense based on patient and insurance preference. Use up to four times daily as directed. (FOR ICD-9 250.00, 250.01). 1 each 0  . cetirizine (ZYRTEC) 10 MG tablet Take 1 tablet (10 mg total) by mouth daily. 100 tablet 1  . clindamycin (CLEOCIN) 300 MG capsule Take 1 capsule (300 mg total) by mouth 3 (three) times daily. 21 capsule 0  . desoximetasone (TOPICORT) 0.25 % cream Apply 1 Application topically 2 (two) times daily. 30 g 2  . diphenoxylate-atropine (LOMOTIL) 2.5-0.025 MG tablet Take 1 tablet by mouth 4 (four) times daily as needed for diarrhea or loose stools. 30 tablet 0  . enalapril (VASOTEC) 5 MG tablet Take 1 tablet (5 mg total) by mouth daily. 90 tablet 1  . EPINEPHrine 0.3 MG/0.3ML SOSY Use as directed for severe allergic reactions 2 each 1  . fluticasone (FLONASE) 50 MCG/ACT nasal spray Place 2 sprays in each nostril once a day as needed for stuffy nose 16 g 5  . fluticasone-salmeterol (ADVAIR HFA) 115-21 MCG/ACT inhaler Inhale 2 puffs into the lungs 2  (two) times daily. 12 g 5  . Lancets 30G MISC Use as directed once daily to check blood sugar.  Diagnosis code E11.9 100 each 5  . meloxicam (MOBIC) 7.5 MG tablet Take 1-2 tablets (7.5-15 mg total) by mouth daily as needed for pain. 60 tablet 3  . montelukast (SINGULAIR) 10 MG tablet Take 1 tablet (10 mg total) by mouth at bedtime. 90 tablet 5  . Multiple Vitamins-Minerals (MULTIVITAMIN WITH MINERALS) tablet Take 1 tablet by mouth daily.    . Olopatadine HCl 0.2 % SOLN Place 1 drop in each eye once a day as needed for itchy watery eyes 2.5 mL 5  . Omega-3 Fatty Acids (FISH OIL CONCENTRATE) 1000 MG CAPS Take by mouth 3 (three) times daily.    . rosuvastatin (CRESTOR) 10 MG tablet Take 1 tablet (10 mg total) by mouth daily. 90 tablet 0  . tacrolimus (PROTOPIC) 0.1 % ointment Apply 1 Application topically 2 (two) times daily. 100 g 2   No facility-administered medications prior to visit.    Allergies  Allergen Reactions  . Codeine Other (See Comments)    "makes me crazy"  . Hornet Venom Anaphylaxis  . Sulfa Antibiotics Anaphylaxis    "died 3 times".  . Doxycycline Other (See Comments)    "Yeast infection"  . Oxycodone   . Wound Dressing Adhesive   . Amoxicillin Rash    Review of Systems  Constitutional:  Positive for malaise/fatigue. Negative for fever.  HENT:  Positive for congestion.   Eyes:  Negative for blurred vision.  Respiratory:  Negative for shortness of breath.   Cardiovascular:  Negative for chest pain, palpitations and leg swelling.  Gastrointestinal:  Negative for abdominal pain, blood in stool and nausea.  Genitourinary:  Negative for dysuria and frequency.  Musculoskeletal:  Negative for falls.  Skin:  Negative for rash.  Neurological:  Negative for dizziness, loss of consciousness and headaches.  Endo/Heme/Allergies:  Negative for environmental allergies.  Psychiatric/Behavioral:  Negative for depression. The patient is not nervous/anxious.        Objective:     Physical Exam Constitutional:      General: She  is not in acute distress.    Appearance: Normal appearance. She is not ill-appearing or toxic-appearing.  HENT:     Head: Normocephalic and atraumatic.     Right Ear: External ear normal.     Left Ear: External ear normal.     Nose: Nose normal.  Eyes:     General:        Right eye: No discharge.        Left eye: No discharge.  Pulmonary:     Effort: Pulmonary effort is normal.  Skin:    Findings: No rash.  Neurological:     Mental Status: She is alert and oriented to person, place, and time.  Psychiatric:        Behavior: Behavior normal.    There were no vitals taken for this visit. Wt Readings from Last 3 Encounters:  06/10/23 186 lb 9.6 oz (84.6 kg)  02/09/23 182 lb (82.6 kg)  11/17/22 184 lb (83.5 kg)    Diabetic Foot Exam - Simple   No data filed    Lab Results  Component Value Date   WBC 6.7 06/21/2023   HGB 13.7 06/21/2023   HCT 41.8 06/21/2023   PLT 184.0 06/21/2023   GLUCOSE 109 (H) 06/21/2023   CHOL 149 06/21/2023   TRIG 78.0 06/21/2023   HDL 71.10 06/21/2023   LDLDIRECT 111.2 02/06/2014   LDLCALC 62 06/21/2023   ALT 11 06/21/2023   AST 14 06/21/2023   NA 141 06/21/2023   K 4.3 06/21/2023   CL 105 06/21/2023   CREATININE 0.76 06/21/2023   BUN 18 06/21/2023   CO2 25 06/21/2023   TSH 0.91 06/21/2023   INR 2.2 09/21/2011   HGBA1C 7.1 (H) 06/21/2023   MICROALBUR 0.8 06/23/2023    Lab Results  Component Value Date   TSH 0.91 06/21/2023   Lab Results  Component Value Date   WBC 6.7 06/21/2023   HGB 13.7 06/21/2023   HCT 41.8 06/21/2023   MCV 90.5 06/21/2023   PLT 184.0 06/21/2023   Lab Results  Component Value Date   NA 141 06/21/2023   K 4.3 06/21/2023   CHLORIDE 110 (H) 10/10/2015   CO2 25 06/21/2023   GLUCOSE 109 (H) 06/21/2023   BUN 18 06/21/2023   CREATININE 0.76 06/21/2023   BILITOT 0.6 06/21/2023   ALKPHOS 77 06/21/2023   AST 14 06/21/2023   ALT 11 06/21/2023   PROT 6.1  06/21/2023   ALBUMIN 4.1 06/21/2023   CALCIUM 9.3 06/21/2023   ANIONGAP 8 10/10/2015   EGFR 86 (L) 10/10/2015   GFR 74.19 06/21/2023   Lab Results  Component Value Date   CHOL 149 06/21/2023   Lab Results  Component Value Date   HDL 71.10 06/21/2023   Lab Results  Component Value Date   LDLCALC 62 06/21/2023   Lab Results  Component Value Date   TRIG 78.0 06/21/2023   Lab Results  Component Value Date   CHOLHDL 2 06/21/2023   Lab Results  Component Value Date   HGBA1C 7.1 (H) 06/21/2023       Assessment & Plan:  Mixed hyperlipidemia Assessment & Plan: Encourage heart healthy diet such as MIND or DASH diet, increase exercise, avoid trans fats, simple carbohydrates and processed foods, consider a krill or fish or flaxseed oil cap daily. Tolerating Rosuvastatin  Orders: -     CBC with Differential/Platelet; Future -     TSH; Future -     Lipid panel; Future  Type 2  diabetes mellitus with obesity (HCC) Assessment & Plan: hgba1c acceptable, minimize simple carbs. Increase exercise as tolerated. Continue current meds   Orders: -     Comprehensive metabolic panel with GFR; Future -     Hemoglobin A1c; Future  Umbilical hernia without obstruction and without gangrene Assessment & Plan: Awaiting ultrasound   Gastroesophageal reflux disease without esophagitis -     CBC with Differential/Platelet; Future -     TSH; Future    Assessment and Plan Assessment & Plan Allergic Rhinitis Severe exacerbation due to high pollen. Current regimen includes Astelin, Flonase, Zyrtec, Singulair, and eye drops. Zyrtec may be increased to twice daily if symptoms worsen. - Continue Astelin, Flonase, Zyrtec, Singulair, and eye drops. - Consider Zyrtec twice daily during severe symptoms.  Hernia Hernia embedded in fat, asymptomatic. Low risk of complications. Imaging planned. - Perform imaging study on Thursday.  Diabetes Mellitus Managing diabetes. Interest in viral  triggers, including COVID-19, affecting the pancreas. - Continue current diabetes management plan.  Hyperlipidemia On cholesterol medication with CoQ10 to mitigate side effects. - Continue current cholesterol medication. - Continue CoQ10 supplementation.  General Health Maintenance Due for lab work in June. Virtual visit planned for review. - Schedule lab work for June. - Schedule virtual visit post-lab work.     Danise Edge, MD

## 2023-07-22 ENCOUNTER — Ambulatory Visit (HOSPITAL_BASED_OUTPATIENT_CLINIC_OR_DEPARTMENT_OTHER)
Admission: RE | Admit: 2023-07-22 | Discharge: 2023-07-22 | Disposition: A | Discharge status: Home / Self Care | Source: Ambulatory Visit | Attending: Family Medicine | Admitting: Family Medicine

## 2023-07-22 ENCOUNTER — Other Ambulatory Visit: Payer: Self-pay | Admitting: Family Medicine

## 2023-07-22 ENCOUNTER — Other Ambulatory Visit (HOSPITAL_BASED_OUTPATIENT_CLINIC_OR_DEPARTMENT_OTHER): Payer: Self-pay

## 2023-07-22 DIAGNOSIS — K429 Umbilical hernia without obstruction or gangrene: Secondary | ICD-10-CM | POA: Diagnosis not present

## 2023-07-22 MED ORDER — ROSUVASTATIN CALCIUM 10 MG PO TABS
10.0000 mg | ORAL_TABLET | Freq: Every day | ORAL | 1 refills | Status: AC
  Filled 2023-07-22: qty 90, 90d supply, fill #0
  Filled 2024-01-05 (×2): qty 90, 90d supply, fill #1

## 2023-07-26 ENCOUNTER — Telehealth: Payer: Self-pay

## 2023-07-26 ENCOUNTER — Encounter: Payer: Self-pay | Admitting: Family Medicine

## 2023-07-26 DIAGNOSIS — K429 Umbilical hernia without obstruction or gangrene: Secondary | ICD-10-CM

## 2023-07-26 NOTE — Telephone Encounter (Signed)
 Spoke with reading room regarding a stat report for US  results on pt.   IMPRESSION: Large fat containing supraumbilical hernia. Fluid also noted within the hernia sac which can be a sign of incarceration. Please correlate with physical exam.

## 2023-07-27 NOTE — Telephone Encounter (Signed)
 Referral put in by Juanetta

## 2023-07-27 NOTE — Addendum Note (Signed)
 Addended by: Jullie Oiler D on: 07/27/2023 03:32 PM   Modules accepted: Orders

## 2023-07-27 NOTE — Telephone Encounter (Signed)
 Called and spoke with patient. She agrees to the referral. She said she will check her MyChart when she gets home this evening.

## 2023-08-10 ENCOUNTER — Other Ambulatory Visit (HOSPITAL_BASED_OUTPATIENT_CLINIC_OR_DEPARTMENT_OTHER): Payer: Self-pay

## 2023-08-16 DIAGNOSIS — Z950 Presence of cardiac pacemaker: Secondary | ICD-10-CM | POA: Diagnosis not present

## 2023-08-16 DIAGNOSIS — I442 Atrioventricular block, complete: Secondary | ICD-10-CM | POA: Diagnosis not present

## 2023-08-25 ENCOUNTER — Ambulatory Visit: Payer: Self-pay | Admitting: General Surgery

## 2023-08-25 ENCOUNTER — Other Ambulatory Visit: Payer: Self-pay | Admitting: General Surgery

## 2023-08-25 DIAGNOSIS — K439 Ventral hernia without obstruction or gangrene: Secondary | ICD-10-CM | POA: Diagnosis not present

## 2023-08-25 NOTE — H&P (Signed)
 History of Present Illness: Vicki Dennis is a 78 y.o. female who is seen today as an office consultation at the request of Dr. Rodrick Clapper for evaluation of New Consultation (Umbilical hernia) .   History of Present Illness Vicki Dennis is an 78 year old female who presents with increased pressure at the hernia site.   She experiences increased pressure at the site of a previous hernia repair, initially performed over twelve years ago. The hernia recurred within two months post-surgery. She is unsure if mesh was used in the initial repair but recalls painful stitches. She underwent an ultrasound in [facility] within the last two to three weeks. The hernia causes pressure but not pain.   She takes enalapril  for blood pressure management, which has been elevated due to recent emotional stressors.         Review of Systems: A complete review of systems was obtained from the patient.  I have reviewed this information and discussed as appropriate with the patient.  See HPI as well for other ROS.   Review of Systems  Constitutional:  Negative for fever.  HENT:  Negative for congestion.   Eyes:  Negative for blurred vision.  Respiratory:  Negative for cough, shortness of breath and wheezing.   Cardiovascular:  Negative for chest pain and palpitations.  Gastrointestinal:  Negative for heartburn.  Genitourinary:  Negative for dysuria.  Musculoskeletal:  Negative for myalgias.  Skin:  Negative for rash.  Neurological:  Negative for dizziness and headaches.  Psychiatric/Behavioral:  Negative for depression and suicidal ideas.   All other systems reviewed and are negative.       Medical History: Past Medical History Past Medical History: Diagnosis Date  Anemia    Arthritis    Asthma, unspecified asthma severity, unspecified whether complicated, unspecified whether persistent (HHS-HCC)    CHF (congestive heart failure) (CMS/HHS-HCC)    Diabetes mellitus without complication  (CMS/HHS-HCC)    Hyperlipidemia    Hypertension         Problem List There is no problem list on file for this patient.     Past Surgical History Past Surgical History: Procedure Laterality Date  HERNIA REPAIR      Pacemaker Placement          Allergies Allergies Allergen Reactions  Codeine Dizziness and Other (See Comments)     "makes me crazy"  Hornet Venom Anaphylaxis  Sulfa (Sulfonamide Antibiotics) Anaphylaxis, Other (See Comments) and Unknown     Pt unsure of actual reaction., Pt said it "dried her up."   Pt unsure of actual reaction.  Pt said it "dried her up."   "died 3 times".  Amoxicillin  Rash  Doxycycline  Other (See Comments)     "Yeast infection"      Medications Ordered Prior to Encounter Current Outpatient Medications on File Prior to Visit Medication Sig Dispense Refill  albuterol  MDI, PROVENTIL , VENTOLIN , PROAIR , HFA 90 mcg/actuation inhaler Inhale 2 Inhalations into the lungs every 4 (four) hours as needed      azelastine  (ASTELIN ) 137 mcg nasal spray Place 2 sprays into one nostril 2 (two) times daily      cetirizine  (ZYRTEC ) 10 MG tablet Take 1 tablet by mouth once daily      enalapril  (VASOTEC ) 5 MG tablet Take 5 mg by mouth once daily      EPINEPHrine  (EPIPEN  JR) 0.15 mg/0.3 mL auto-injector EPI EZ PEN      fluticasone  propion-salmeteroL (ADVAIR  HFA) 115-21 mcg/actuation inhaler Inhale 2 inhalations  into the lungs 2 (two) times daily      montelukast  (SINGULAIR ) 10 mg tablet Montelukast  Sodium      multivitamin with minerals tablet Take 1 tablet by mouth once daily      rosuvastatin  (CRESTOR ) 10 MG tablet Take 1 tablet by mouth once daily        No current facility-administered medications on file prior to visit.      Family History Family History Problem Relation Age of Onset  Skin cancer Mother    High blood pressure (Hypertension) Mother    Hyperlipidemia (Elevated cholesterol) Mother    Coronary Artery Disease (Blocked arteries  around heart) Brother        Tobacco Use History Social History    Tobacco Use Smoking Status Never Smokeless Tobacco Never      Social History Social History    Socioeconomic History  Marital status: Divorced Tobacco Use  Smoking status: Never  Smokeless tobacco: Never Vaping Use  Vaping status: Never Used Substance and Sexual Activity  Alcohol use: Yes  Drug use: Never    Social Drivers of Manufacturing engineer Strain: Low Risk  (07/19/2023)   Received from [facility]   Overall Financial Resource Strain (CARDIA)    Difficulty of Paying Living Expenses: Not very hard Food Insecurity: Food Insecurity Present (07/19/2023)   Received from [facility]   Hunger Vital Sign    Worried About Running Out of Food in the Last Year: Sometimes true    Ran Out of Food in the Last Year: Never true Transportation Needs: No Transportation Needs (07/19/2023)   Received from [facility] - Transportation    Lack of Transportation (Medical): No    Lack of Transportation (Non-Medical): No Physical Activity: Sufficiently Active (07/19/2023)   Received from [facility]   Exercise Vital Sign    Days of Exercise per Week: 5 days    Minutes of Exercise per Session: 60 min Stress: No Stress Concern Present (07/19/2023)   Received from [facility] of Occupational Health - Occupational Stress Questionnaire    Feeling of Stress : Only a little Social Connections: Moderately Integrated (07/19/2023)   Received from [facility]   Social Connection and Isolation Panel [NHANES]    Frequency of Communication with Friends and Family: More than three times a week    Frequency of Social Gatherings with Friends and Family: Patient declined    Attends Religious Services: More than 4 times per year    Active Member of Clubs or Organizations: Yes    Attends Banker Meetings: Patient declined    Marital Status: Divorced Housing Stability: Unknown  (08/25/2023)   Housing Stability Vital Sign    Homeless in the Last Year: No      Objective:     Vitals:   08/25/23 0841 BP: (!) 178/94 Pulse: 79 Temp: 36.7 C (98.1 F) SpO2: 97% Weight: 85.3 kg (188 lb) Height: 170.2 cm (5\' 7" ) PainSc: 0-No pain PainLoc: Abdomen   Body mass index is 29.44 kg/m.   Physical Exam Constitutional:      Appearance: Normal appearance.  HENT:     Head: Normocephalic and atraumatic.     Mouth/Throat:     Mouth: Mucous membranes are moist.     Pharynx: Oropharynx is clear.  Eyes:     General: No scleral icterus.    Pupils: Pupils are equal, round, and reactive to light.  Cardiovascular:  Rate and Rhythm: Normal rate and regular rhythm.     Pulses: Normal pulses.     Heart sounds: No murmur heard.    No friction rub. No gallop.  Pulmonary:     Effort: Pulmonary effort is normal. No respiratory distress.     Breath sounds: Normal breath sounds. No stridor.  Abdominal:     General: Abdomen is flat.     Hernia: A hernia is present.     Musculoskeletal:        General: No swelling.  Skin:    General: Skin is warm.  Neurological:     General: No focal deficit present.     Mental Status: She is alert and oriented to person, place, and time. Mental status is at baseline.  Psychiatric:        Mood and Affect: Mood normal.        Thought Content: Thought content normal.        Judgment: Judgment normal.        Hernia Size:3cm Incarcerated: yes Initial Hernia     Assessment and Plan: Diagnoses and all orders for this visit:   Ventral hernia without obstruction or gangrene     Vicki Dennis is a 78 y.o. female    Will obtain cardiac clearance Will plan on CT to eval size and contents of hernia   1.  We will proceed to the OR for a lap ventral hernia repair with mesh. 2. All risks and benefits were discussed with the patient, to generally include infection, bleeding, damage to surrounding structures, acute and chronic nerve  pain, and recurrence. Alternatives were offered and described.  All questions were answered and the patient voiced understanding of the procedure and wishes to proceed at this point.             No follow-ups on file.   Shela Derby, MD, [facility], [place] [facility]

## 2023-08-26 IMAGING — MG MM DIGITAL SCREENING BILAT W/ TOMO AND CAD
8 series · 8 of 24 positions shown · non-contrast
Comparison: Previous exam(s).

CLINICAL DATA: Screening.

EXAM:
DIGITAL SCREENING BILATERAL MAMMOGRAM WITH TOMOSYNTHESIS AND CAD
TECHNIQUE: Bilateral screening digital craniocaudal and mediolateral oblique
mammograms were obtained. Bilateral screening digital breast
tomosynthesis was performed. The images were evaluated with
computer-aided detection.

[L CC synth-2D]
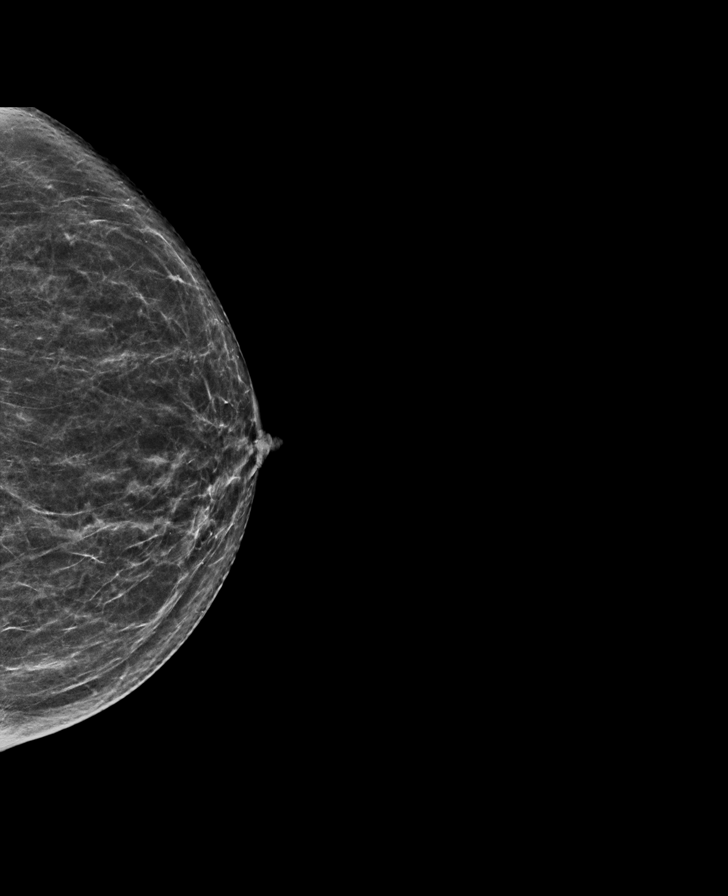

[R CC synth-2D]
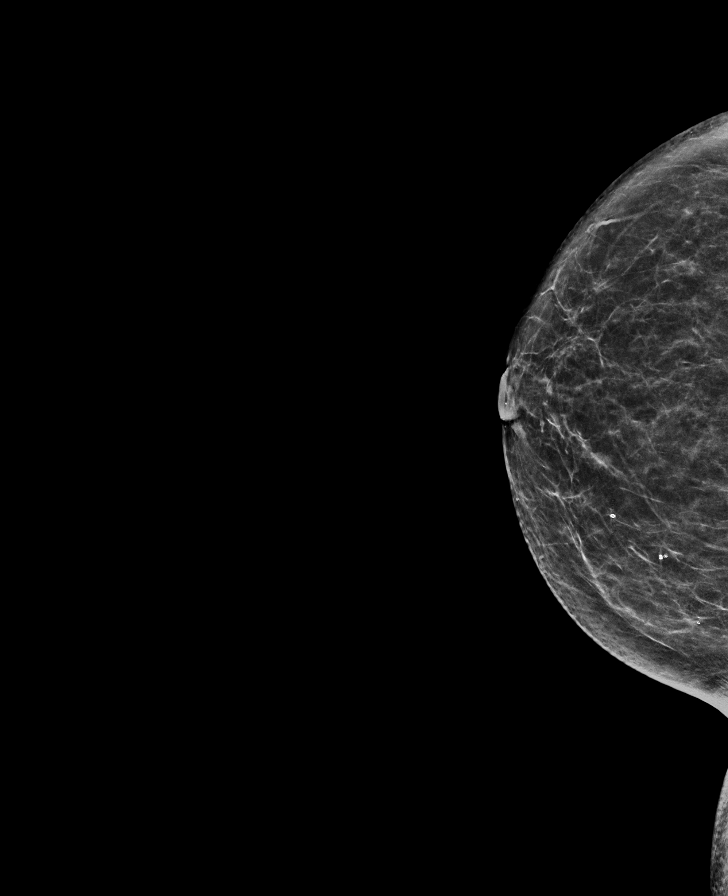

[R MLO synth-2D]
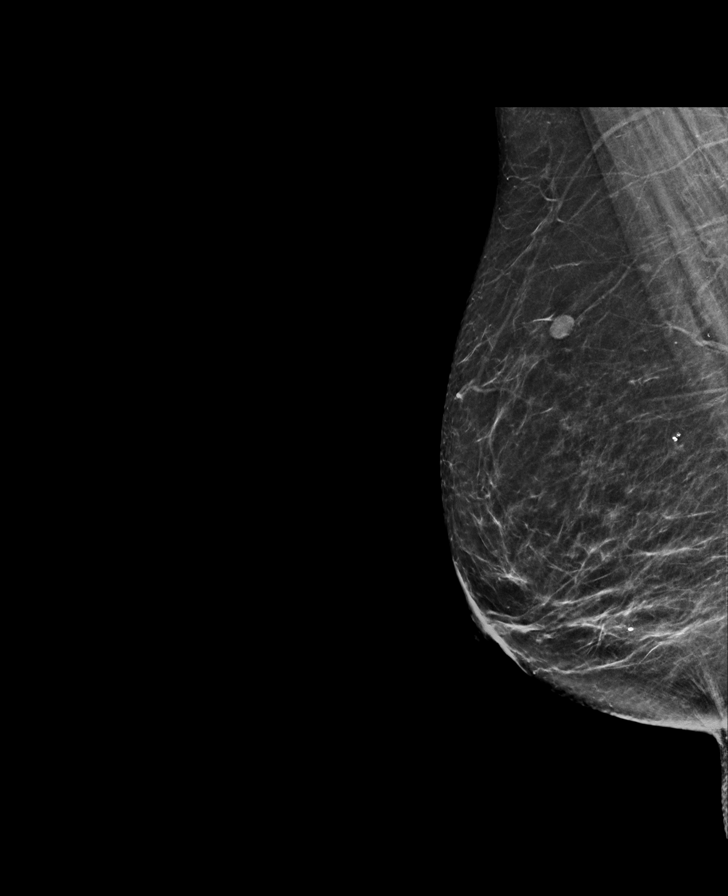

[L MLO synth-2D]
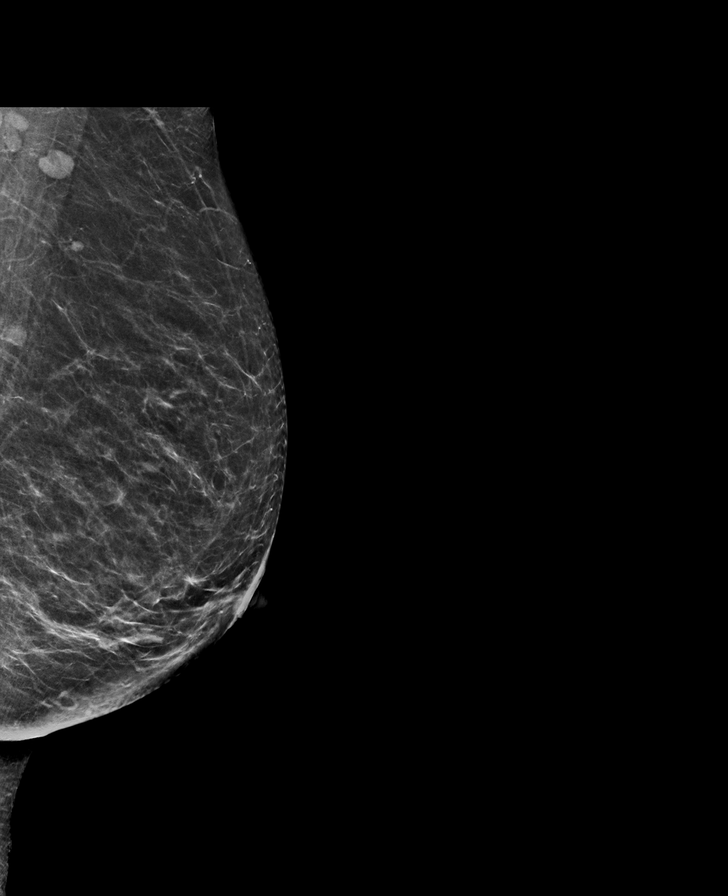

[R CC tomo · tomo slice 29/58.0]
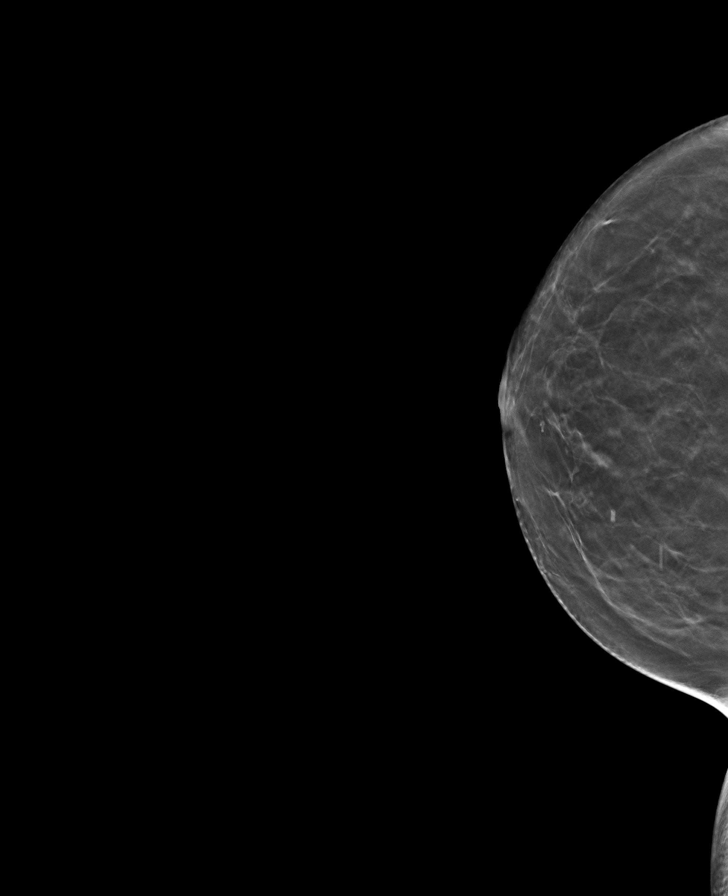

[R MLO tomo · tomo slice 33/64.0]
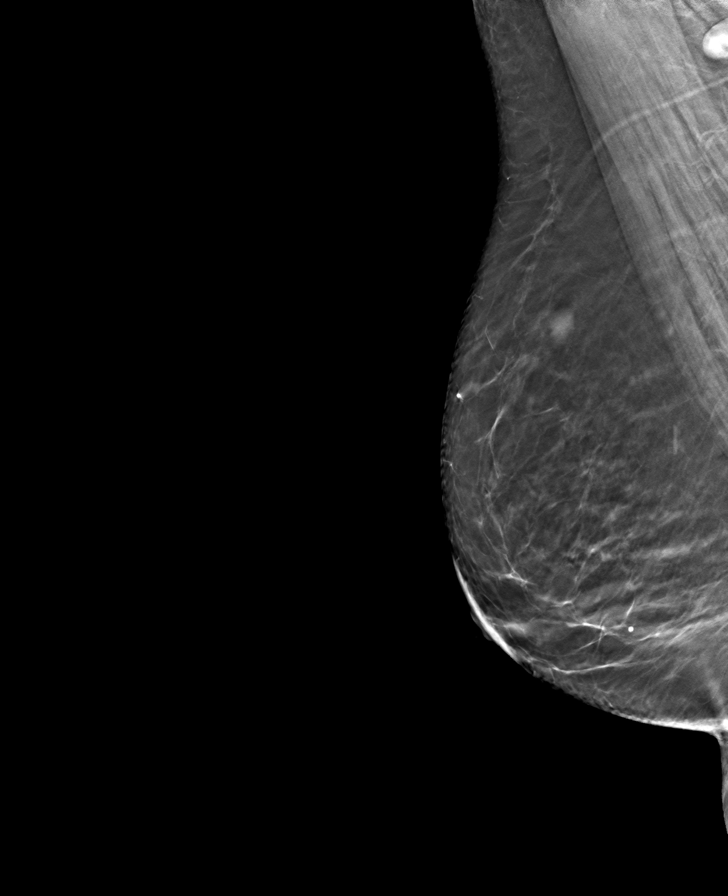

[L MLO tomo · tomo slice 33/64.0]
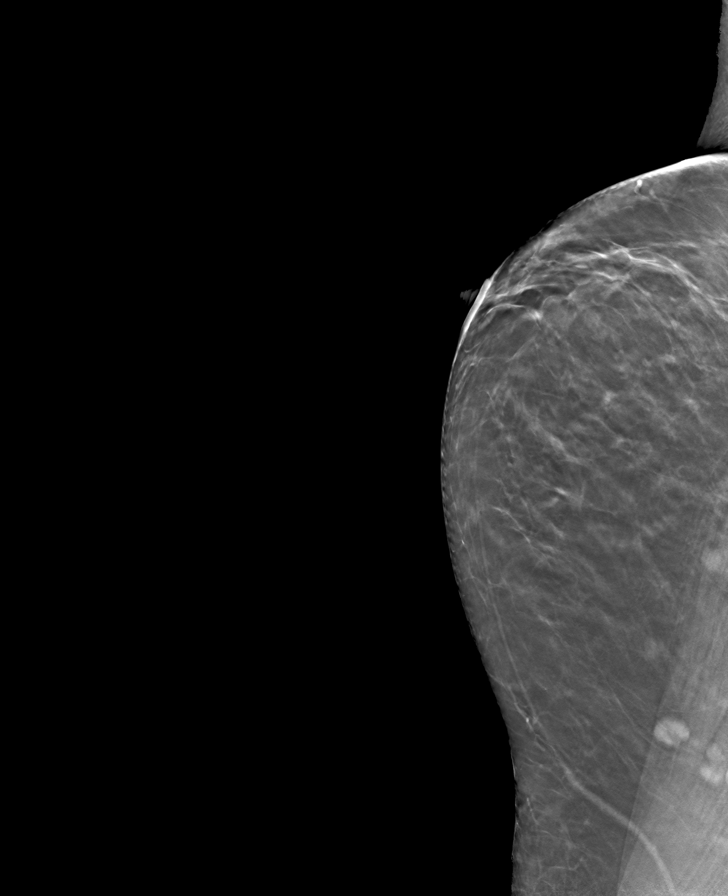

[L CC tomo · tomo slice 30/59.0]
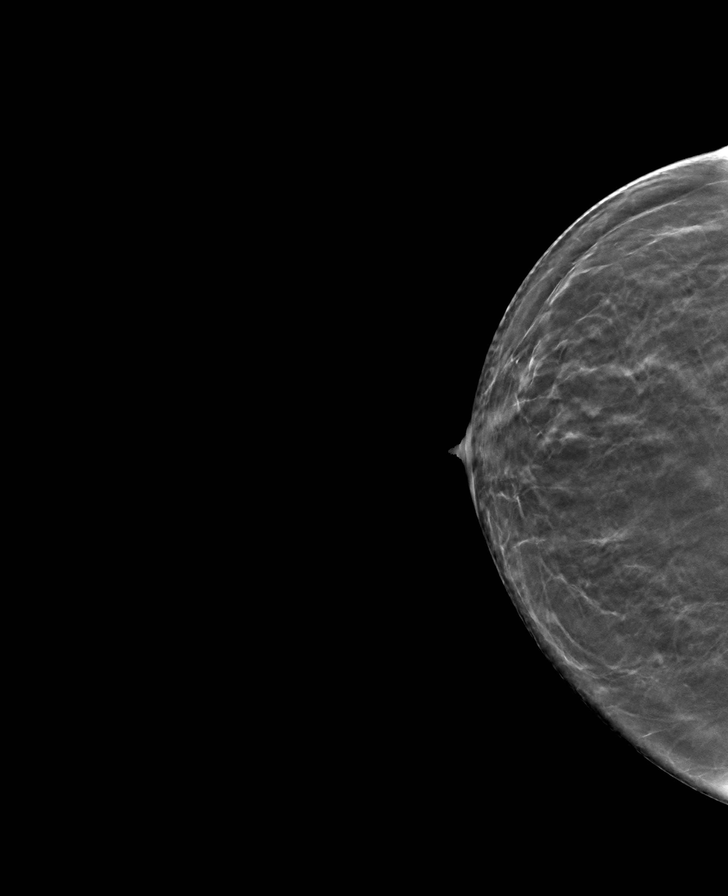

[8 of 24 positions shown; findings below may reference images not displayed]

ACR Breast Density Category b: There are scattered areas of
fibroglandular density.
FINDINGS: There are no findings suspicious for malignancy.
IMPRESSION: No mammographic evidence of malignancy. A result letter of this
screening mammogram will be mailed directly to the patient.

RECOMMENDATION:
Screening mammogram in one year. (Code:51-O-LD2)

BI-RADS CATEGORY  1: Negative.

## 2023-08-27 ENCOUNTER — Other Ambulatory Visit: Payer: Self-pay

## 2023-08-27 MED ORDER — ADVAIR HFA 115-21 MCG/ACT IN AERO: INHALATION_SPRAY | RESPIRATORY_TRACT | 5 refills | Status: AC

## 2023-09-01 DIAGNOSIS — I10 Essential (primary) hypertension: Secondary | ICD-10-CM | POA: Diagnosis not present

## 2023-09-01 DIAGNOSIS — I350 Nonrheumatic aortic (valve) stenosis: Secondary | ICD-10-CM | POA: Diagnosis not present

## 2023-09-01 DIAGNOSIS — I442 Atrioventricular block, complete: Secondary | ICD-10-CM | POA: Diagnosis not present

## 2023-09-08 ENCOUNTER — Ambulatory Visit: Payer: Medicare PPO | Admitting: Allergy

## 2023-09-13 ENCOUNTER — Other Ambulatory Visit (HOSPITAL_BASED_OUTPATIENT_CLINIC_OR_DEPARTMENT_OTHER): Payer: Self-pay

## 2023-09-13 ENCOUNTER — Telehealth: Payer: Self-pay | Admitting: Orthopedic Surgery

## 2023-09-13 NOTE — Telephone Encounter (Signed)
 Submitted online [employer], no auth needed, bil knees, must be 10/02/23 or later.

## 2023-09-13 NOTE — Telephone Encounter (Signed)
 Pt request auth for gel injection

## 2023-09-14 ENCOUNTER — Other Ambulatory Visit (INDEPENDENT_AMBULATORY_CARE_PROVIDER_SITE_OTHER)

## 2023-09-14 ENCOUNTER — Other Ambulatory Visit: Payer: Self-pay | Admitting: Family Medicine

## 2023-09-14 ENCOUNTER — Ambulatory Visit: Payer: Self-pay | Admitting: Family Medicine

## 2023-09-14 ENCOUNTER — Other Ambulatory Visit: Payer: Self-pay

## 2023-09-14 ENCOUNTER — Other Ambulatory Visit (HOSPITAL_BASED_OUTPATIENT_CLINIC_OR_DEPARTMENT_OTHER): Payer: Self-pay

## 2023-09-14 DIAGNOSIS — K219 Gastro-esophageal reflux disease without esophagitis: Secondary | ICD-10-CM

## 2023-09-14 DIAGNOSIS — E669 Obesity, unspecified: Secondary | ICD-10-CM

## 2023-09-14 DIAGNOSIS — E1169 Type 2 diabetes mellitus with other specified complication: Secondary | ICD-10-CM

## 2023-09-14 DIAGNOSIS — E782 Mixed hyperlipidemia: Secondary | ICD-10-CM | POA: Diagnosis not present

## 2023-09-14 DIAGNOSIS — M1712 Unilateral primary osteoarthritis, left knee: Secondary | ICD-10-CM

## 2023-09-14 DIAGNOSIS — M1711 Unilateral primary osteoarthritis, right knee: Secondary | ICD-10-CM

## 2023-09-14 LAB — CBC WITH DIFFERENTIAL/PLATELET
Basophils Absolute: 0.1 10*3/uL (ref 0.0–0.1)
Basophils Relative: 1.1 % (ref 0.0–3.0)
Eosinophils Absolute: 0.2 10*3/uL (ref 0.0–0.7)
Eosinophils Relative: 3.8 % (ref 0.0–5.0)
HCT: 40.7 % (ref 36.0–46.0)
Hemoglobin: 13.4 g/dL (ref 12.0–15.0)
Lymphocytes Relative: 22.9 % (ref 12.0–46.0)
Lymphs Abs: 1.5 10*3/uL (ref 0.7–4.0)
MCHC: 32.9 g/dL (ref 30.0–36.0)
MCV: 89 fl (ref 78.0–100.0)
Monocytes Absolute: 0.6 10*3/uL (ref 0.1–1.0)
Monocytes Relative: 10 % (ref 3.0–12.0)
Neutro Abs: 4 10*3/uL (ref 1.4–7.7)
Neutrophils Relative %: 62.2 % (ref 43.0–77.0)
Platelets: 180 10*3/uL (ref 150.0–400.0)
RBC: 4.57 Mil/uL (ref 3.87–5.11)
RDW: 14.4 % (ref 11.5–15.5)
WBC: 6.5 10*3/uL (ref 4.0–10.5)

## 2023-09-14 LAB — LIPID PANEL
Cholesterol: 173 mg/dL (ref 0–200)
HDL: 82.4 mg/dL (ref >39.00)
LDL Cholesterol: 77 mg/dL (ref 0–99)
NonHDL: 90.6
Total CHOL/HDL Ratio: 2
Triglycerides: 66 mg/dL (ref 0.0–149.0)
VLDL: 13.2 mg/dL (ref 0.0–40.0)

## 2023-09-14 LAB — COMPREHENSIVE METABOLIC PANEL WITH GFR
ALT: 17 U/L (ref 0–35)
AST: 19 U/L (ref 0–37)
Albumin: 4.1 g/dL (ref 3.5–5.2)
Alkaline Phosphatase: 74 U/L (ref 39–117)
BUN: 17 mg/dL (ref 6–23)
CO2: 28 meq/L (ref 19–32)
Calcium: 9.3 mg/dL (ref 8.4–10.5)
Chloride: 103 meq/L (ref 96–112)
Creatinine, Ser: 0.7 mg/dL (ref 0.40–1.20)
GFR: 81.76 mL/min (ref >60.00)
Glucose, Bld: 108 mg/dL — ABNORMAL HIGH (ref 70–99)
Potassium: 5 meq/L (ref 3.5–5.1)
Sodium: 138 meq/L (ref 135–145)
Total Bilirubin: 0.7 mg/dL (ref 0.2–1.2)
Total Protein: 6.3 g/dL (ref 6.0–8.3)

## 2023-09-14 LAB — HEMOGLOBIN A1C: Hgb A1c MFr Bld: 7.1 % — ABNORMAL HIGH (ref 4.6–6.5)

## 2023-09-14 LAB — TSH: TSH: 0.96 u[IU]/mL (ref 0.35–5.50)

## 2023-09-14 NOTE — Telephone Encounter (Signed)
 Tried calling patient to schedule for gel injection with Dr. Rozelle Corning or [place] after 10/01/2023, but no answer and not able to leave a message due to VM being full.  See referrals tab

## 2023-09-18 NOTE — Assessment & Plan Note (Signed)
 hgba1c acceptable, minimize simple carbs. Increase exercise as tolerated. Continue current meds

## 2023-09-18 NOTE — Assessment & Plan Note (Signed)
 Encourage heart healthy diet such as MIND or DASH diet, increase exercise, avoid trans fats, simple carbohydrates and processed foods, consider a krill or fish or flaxseed oil cap daily. Tolerating Rosuvastatin

## 2023-09-18 NOTE — Assessment & Plan Note (Signed)
 Has done well since the placement of her pacemaker

## 2023-09-21 ENCOUNTER — Encounter: Payer: Self-pay | Admitting: Family Medicine

## 2023-09-21 ENCOUNTER — Telehealth: Admitting: Family Medicine

## 2023-09-21 DIAGNOSIS — I459 Conduction disorder, unspecified: Secondary | ICD-10-CM

## 2023-09-21 DIAGNOSIS — E669 Obesity, unspecified: Secondary | ICD-10-CM

## 2023-09-21 DIAGNOSIS — E782 Mixed hyperlipidemia: Secondary | ICD-10-CM | POA: Diagnosis not present

## 2023-09-21 DIAGNOSIS — E1169 Type 2 diabetes mellitus with other specified complication: Secondary | ICD-10-CM | POA: Diagnosis not present

## 2023-09-21 NOTE — Progress Notes (Signed)
 MyChart Video Visit    Virtual Visit via Video Note   This patient is at least at moderate risk for complications without adequate follow up. This format is felt to be most appropriate for this patient at this time. Physical exam was limited by quality of the video and audio technology used for the visit. Porsha, CMA was able to get the patient set up on a video visit.  Patient location: home Patient and provider in visit Provider location: Office  I discussed the limitations of evaluation and management by telemedicine and the availability of in person appointments. The patient expressed understanding and agreed to proceed.  Visit Date: 09/21/2023  Today's healthcare provider: Randie Bustle, MD     Subjective:    Patient ID: Vicki Dennis, female    DOB: 1945-06-05, 78 y.o.   MRN: [id number]  No chief complaint on file.   HPI Discussed the use of AI scribe software for clinical note transcription with the patient, who gave verbal consent to proceed.  History of Present Illness Vicki Dennis is an 78 year old female who presents for a follow-up visit to discuss her recent lab work and ongoing health concerns.  Her recent lab work was stable, and she is scheduled for a follow-up appointment in September, planning to have lab work done prior to that visit.  She has a history of pneumonia with some residual fluid. A previous consultation for potential surgery did not result in a follow-up call. No recent exacerbation of pneumonia symptoms.  She receives hyaluronic acid injections in her knee every six months and is currently coordinating for her next injection. No current knee pain reported.  She is scheduled for an eye examination to update her glasses prescription. She emphasizes the importance of regular eye exams due to her history of diabetes.  She recently had a pacemaker surgery and recalls being told that she had healed from the procedure. No signs of infection or  complications from pacemaker surgery.  She discusses her general health maintenance, including her diet and staying active, which she believes contributes to her good health. She mentions reading about AIDS testing but has never been tested, as she does not believe she is at risk.  She is considering the timing of her next COVID-19 vaccination, planning to take it in the fall along with her flu shot.    Past Medical History:  Diagnosis Date   Allergy    Anemia    h/o   Asthma    Asthma, mild intermittent, well-controlled 02/10/2014   Blood transfusion without reported diagnosis    Chicken pox as a child   Diabetes mellitus    type-II, per Dr. Nida Barrow, no meds. , pt. reports weight loss has contributed  to control of diabetes.  She reports last HgbA1c- wnl.   Diabetes mellitus 11/05/2010   Diabetes mellitus type 2 in obese 11/05/2010   Diabetes mellitus type 2, uncontrolled 11/05/2010   Esophageal reflux 02/10/2014   GERD (gastroesophageal reflux disease)    rare use of OTC aid   Hyperglycemia 09/02/2015   Hyperlipidemia    Hypertension    Measles as a child   Measles    Medicare annual wellness visit, subsequent 08/06/2014   Mumps as a child   Obesity 02/10/2014   Pneumonia    as a child   Portal vein thrombosis 03/09/2011   coumadin ended summer of 2013   Preventative health care 08/12/2014   Rash and nonspecific skin eruption  01/07/2015   Seborrheic keratosis 02/10/2014   Follows with dermatology   Sinusitis, acute 03/23/2016    Past Surgical History:  Procedure Laterality Date   BREAST BIOPSY Bilateral    INSERTION OF MESH  03/16/2012   Procedure: INSERTION OF MESH;  Surgeon: Rogena Class, MD;  Location: [facility] OR;  Service: General;  Laterality: N/A;   KNEE ARTHROSCOPY W/ MENISCAL REPAIR     both knees   Pace maker     TONSILLECTOMY     UMBILICAL HERNIA REPAIR  03/16/2012   Procedure: HERNIA REPAIR UMBILICAL ADULT;  Surgeon: Rogena Class, MD;  Location: [facility] OR;   Service: General;  Laterality: N/A;    Family History  Problem Relation Age of Onset   Cancer Father        prostate   Aneurysm Mother 77       brain   Cancer Maternal Aunt     Social History   Socioeconomic History   Marital status: Divorced    Spouse name: Not on file   Number of children: Not on file   Years of education: Not on file   Highest education level: Master's degree (e.g., MA, MS, MEng, MEd, MSW, MBA)  Occupational History   Not on file  Tobacco Use   Smoking status: Never   Smokeless tobacco: Never   Tobacco comments:    never used tobacco  Vaping Use   Vaping status: Never Used  Substance and Sexual Activity   Alcohol use: Yes    Alcohol/week: 0.0 standard drinks of alcohol    Comment: for holidays   Drug use: No   Sexual activity: Not Currently    Comment: lives alone   Other Topics Concern   Not on file  Social History Narrative   Retired Runner, broadcasting/film/video   Social Drivers of Corporate investment banker Strain: Low Risk  (07/19/2023)   Overall Financial Resource Strain (CARDIA)    Difficulty of Paying Living Expenses: Not very hard  Food Insecurity: Food Insecurity Present (07/19/2023)   Hunger Vital Sign    Worried About Running Out of Food in the Last Year: Sometimes true    Ran Out of Food in the Last Year: Never true  Transportation Needs: No Transportation Needs (07/19/2023)   PRAPARE - Administrator, Civil Service (Medical): No    Lack of Transportation (Non-Medical): No  Physical Activity: Sufficiently Active (07/19/2023)   Exercise Vital Sign    Days of Exercise per Week: 5 days    Minutes of Exercise per Session: 60 min  Stress: No Stress Concern Present (07/19/2023)   [employer] of Occupational Health - Occupational Stress Questionnaire    Feeling of Stress : Only a little  Social Connections: Moderately Integrated (07/19/2023)   Social Connection and Isolation Panel [NHANES]    Frequency of Communication with Friends and Family:  More than three times a week    Frequency of Social Gatherings with Friends and Family: Patient declined    Attends Religious Services: More than 4 times per year    Active Member of [employer] or Organizations: Yes    Attends Banker Meetings: Patient declined    Marital Status: Divorced  Catering manager Violence: Not At Risk (02/09/2023)   Humiliation, Afraid, Rape, and Kick questionnaire    Fear of Current or Ex-Partner: No    Emotionally Abused: No    Physically Abused: No    Sexually Abused: No    Outpatient Medications Prior  to Visit  Medication Sig Dispense Refill   ACCU-CHEK AVIVA PLUS test strip use to check blood sugar once daily as directed 100 strip 0   ADVAIR  HFA 115-21 MCG/ACT inhaler 1-2 puffs 2(TWO) times a day with a spacer. 12 g 5   albuterol  (PROVENTIL  HFA) 108 (90 Base) MCG/ACT inhaler Inhale 2 puffs into the lungs every 4 (four) hours as needed for wheezing or shortness of breath. 18 g 1   Azelastine  HCl 137 MCG/SPRAY SOLN PLACE TWO SPRAYS INTO BOTH NOSTRILS TWO TIMES A DAY 30 mL 0   blood glucose meter kit and supplies KIT Dispense based on patient and insurance preference. Use up to four times daily as directed. (FOR ICD-9 250.00, 250.01). 1 each 0   cetirizine  (ZYRTEC ) 10 MG tablet Take 1 tablet (10 mg total) by mouth daily. 100 tablet 1   clindamycin  (CLEOCIN ) 300 MG capsule Take 1 capsule (300 mg total) by mouth 3 (three) times daily. 21 capsule 0   desoximetasone  (TOPICORT ) 0.25 % cream Apply 1 Application topically 2 (two) times daily. 30 g 2   diphenoxylate -atropine  (LOMOTIL ) 2.5-0.025 MG tablet Take 1 tablet by mouth 4 (four) times daily as needed for diarrhea or loose stools. 30 tablet 0   enalapril  (VASOTEC ) 5 MG tablet Take 1 tablet (5 mg total) by mouth daily. 90 tablet 1   EPINEPHrine  0.3 MG/0.3ML SOSY Use as directed for severe allergic reactions 2 each 1   fluticasone  (FLONASE ) 50 MCG/ACT nasal spray Place 2 sprays in each nostril once a day as  needed for stuffy nose 16 g 5   Lancets 30G MISC Use as directed once daily to check blood sugar.  Diagnosis code E11.9 100 each 5   meloxicam  (MOBIC ) 7.5 MG tablet Take 1-2 tablets (7.5-15 mg total) by mouth daily as needed for pain. 60 tablet 3   montelukast  (SINGULAIR ) 10 MG tablet Take 1 tablet (10 mg total) by mouth at bedtime. 90 tablet 5   Multiple Vitamins-Minerals (MULTIVITAMIN WITH MINERALS) tablet Take 1 tablet by mouth daily.     Olopatadine  HCl 0.2 % SOLN Place 1 drop into both eyes daily as needed for itchy watery eyes. 2.5 mL 5   Omega-3 Fatty Acids (FISH OIL CONCENTRATE) 1000 MG CAPS Take by mouth 3 (three) times daily.     rosuvastatin  (CRESTOR ) 10 MG tablet Take 1 tablet (10 mg total) by mouth daily. 90 tablet 1   tacrolimus  (PROTOPIC ) 0.1 % ointment Apply 1 Application topically 2 (two) times daily. 100 g 2   No facility-administered medications prior to visit.    Allergies  Allergen Reactions   Codeine Other (See Comments)    "makes me crazy"   Hornet Venom Anaphylaxis   Sulfa Antibiotics Anaphylaxis    "died 3 times".   Doxycycline  Other (See Comments)    "Yeast infection"   Oxycodone     Wound Dressing Adhesive    Amoxicillin  Rash    Review of Systems  Constitutional:  Negative for fever and malaise/fatigue.  HENT:  Negative for congestion.   Eyes:  Negative for blurred vision.  Respiratory:  Negative for shortness of breath.   Cardiovascular:  Negative for chest pain, palpitations and leg swelling.  Gastrointestinal:  Negative for abdominal pain, blood in stool and nausea.  Genitourinary:  Negative for dysuria and frequency.  Musculoskeletal:  Positive for joint pain. Negative for falls.  Skin:  Negative for rash.  Neurological:  Negative for dizziness, loss of consciousness and headaches.  Endo/Heme/Allergies:  Negative  for environmental allergies.  Psychiatric/Behavioral:  Negative for depression. The patient is not nervous/anxious.        Objective:      Physical Exam Constitutional:      General: She is not in acute distress.    Appearance: Normal appearance. She is not ill-appearing or toxic-appearing.  HENT:     Head: Normocephalic and atraumatic.     Right Ear: External ear normal.     Left Ear: External ear normal.     Nose: Nose normal.  Eyes:     General:        Right eye: No discharge.        Left eye: No discharge.  Pulmonary:     Effort: Pulmonary effort is normal.  Skin:    Findings: No rash.  Neurological:     Mental Status: She is alert and oriented to person, place, and time.  Psychiatric:        Behavior: Behavior normal.   There were no vitals taken for this visit. Wt Readings from Last 3 Encounters:  06/10/23 186 lb 9.6 oz (84.6 kg)  02/09/23 182 lb (82.6 kg)  11/17/22 184 lb (83.5 kg)       Assessment & Plan:  Type 2 diabetes mellitus with obesity (HCC) Assessment & Plan: hgba1c acceptable, minimize simple carbs. Increase exercise as tolerated. Continue current meds    Heart block Assessment & Plan: Has done well since the placement of her pacemaker   Mixed hyperlipidemia Assessment & Plan: Encourage heart healthy diet such as MIND or DASH diet, increase exercise, avoid trans fats, simple carbohydrates and processed foods, consider a krill or fish or flaxseed oil cap daily. Tolerating Rosuvastatin       Assessment and Plan Assessment & Plan Pneumonia Mild pneumonia with fluid present. Symptoms well-managed, no immediate intervention needed. - Monitor for symptom escalation and adjust care as needed.  Knee Osteoarthritis Managed with hyaluronic acid injections. Delay in latest injection due to referral issues. Discussed treatment with Dr. Rozelle Corning and alternatives if response is inadequate. - Confirm referral and schedule hyaluronic acid injection with Dr. Rozelle Corning. - Consider alternative treatments if injection response is inadequate.  Vision Care Routine eye examination scheduled to update  prescription glasses, important for managing vision with diabetes. - Attend eye examination with Dr. Ammon Bales at [facility]. - Update prescription glasses as needed.  General Health Maintenance Routine health maintenance current. Blood work stable. Discussed HIV testing as a routine precaution, no current indication. Emphasized regular monitoring due to diabetes. - Continue routine blood work monitoring. - Offer HIV testing as a routine precaution.  COVID-19 Vaccination Discussed timing of COVID-19 vaccination. Recommended to receive in the fall with flu shot based on strain coverage and flu season. - Schedule COVID-19 vaccination and flu shot in the fall.  Follow-up Next appointment scheduled for September. Plan to conduct lab work prior for discussion during visit. - Schedule lab work prior to September appointment. - Attend follow-up appointment in September.     I discussed the assessment and treatment plan with the patient. The patient was provided an opportunity to ask questions and all were answered. The patient agreed with the plan and demonstrated an understanding of the instructions.   The patient was advised to call back or seek an in-person evaluation if the symptoms worsen or if the condition fails to improve as anticipated.  Randie Bustle, MD [facility] at [facility] [phone] (phone) [phone] (fax)  [facility]

## 2023-09-22 LAB — HM DIABETES EYE EXAM

## 2023-10-18 ENCOUNTER — Ambulatory Visit: Payer: Medicare PPO | Admitting: Family Medicine

## 2023-10-22 ENCOUNTER — Encounter: Payer: Self-pay | Admitting: Orthopedic Surgery

## 2023-10-22 ENCOUNTER — Ambulatory Visit: Admitting: Orthopedic Surgery

## 2023-10-22 ENCOUNTER — Telehealth: Payer: Self-pay

## 2023-10-22 ENCOUNTER — Other Ambulatory Visit: Payer: Self-pay

## 2023-10-22 ENCOUNTER — Other Ambulatory Visit (HOSPITAL_BASED_OUTPATIENT_CLINIC_OR_DEPARTMENT_OTHER): Payer: Self-pay

## 2023-10-22 ENCOUNTER — Other Ambulatory Visit: Payer: Self-pay | Admitting: Allergy

## 2023-10-22 ENCOUNTER — Other Ambulatory Visit: Payer: Self-pay | Admitting: Family Medicine

## 2023-10-22 DIAGNOSIS — M1711 Unilateral primary osteoarthritis, right knee: Secondary | ICD-10-CM

## 2023-10-22 DIAGNOSIS — M1712 Unilateral primary osteoarthritis, left knee: Secondary | ICD-10-CM

## 2023-10-22 MED ORDER — AZELASTINE HCL 0.1 % NA SOLN
2.0000 | Freq: Two times a day (BID) | NASAL | 5 refills | Status: AC
  Filled 2023-10-22: qty 30, 50d supply, fill #0
  Filled 2024-01-05 (×2): qty 30, 50d supply, fill #1

## 2023-10-22 MED ORDER — LIDOCAINE HCL 1 % IJ SOLN
5.0000 mL | INTRAMUSCULAR | Status: AC | PRN
  Administered 2023-10-22: 5 mL

## 2023-10-22 MED ORDER — BUPIVACAINE HCL 0.25 % IJ SOLN
4.0000 mL | INTRAMUSCULAR | Status: AC | PRN
  Administered 2023-10-22: 4 mL via INTRA_ARTICULAR

## 2023-10-22 MED ORDER — ENALAPRIL MALEATE 5 MG PO TABS
5.0000 mg | ORAL_TABLET | Freq: Every day | ORAL | 1 refills | Status: AC
  Filled 2023-10-22: qty 90, 90d supply, fill #0
  Filled 2024-02-02 – 2024-04-24 (×2): qty 90, 90d supply, fill #1

## 2023-10-22 MED ORDER — TRIAMCINOLONE ACETONIDE 40 MG/ML IJ SUSP
40.0000 mg | INTRAMUSCULAR | Status: AC | PRN
  Administered 2023-10-22: 40 mg via INTRA_ARTICULAR

## 2023-10-22 NOTE — Addendum Note (Signed)
 Addended by: ADDIE HACKER on: 10/22/2023 04:39 PM   Modules accepted: Level of Service

## 2023-10-22 NOTE — Progress Notes (Signed)
 Office Visit Note   Patient: Vicki Dennis           Date of Birth: 1946/01/16           MRN: [id number] Visit Date: 10/22/2023 Requested by: Domenica Harlene LABOR, MD [address] [place],  [place] [address] PCP: Domenica Harlene LABOR, MD  Subjective: Chief Complaint  Patient presents with   Other    Wants bilateral knee cortisone injections    HPI: Vicki Dennis is a 78 y.o. female who presents to the office reporting bilateral knee pain.  Patient is going to retire in 2 years.  Reports severe bilateral knee pain worse with walking.  Would like to have cortisone injections which have helped her in the past..                ROS: All systems reviewed are negative as they relate to the chief complaint within the history of present illness.  Patient denies fevers or chills.  Assessment & Plan: Visit Diagnoses:  1. Unilateral primary osteoarthritis, left knee   2. Unilateral primary osteoarthritis, right knee     Plan: Impression is bilateral knee arthritis.  Bilateral knee injections performed today.  We will preapproved for for bilateral knee gel injections to be performed once these cortisone shots wear off.  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  Follow-Up Instructions: No follow-ups on file.   Orders:  No orders of the defined types were placed in this encounter.  No orders of the defined types were placed in this encounter.     Procedures: Large Joint Inj: bilateral knee on 10/22/2023 4:37 PM Indications: diagnostic evaluation, joint swelling and pain Details: 18 G 1.5 in needle, superolateral approach  Arthrogram: No  Medications (Right): 5 mL lidocaine  1 %; 4 mL bupivacaine  0.25 %; 40 mg triamcinolone  acetonide 40 MG/ML Medications (Left): 5 mL lidocaine  1 %; 4 mL bupivacaine  0.25 %; 40 mg triamcinolone  acetonide 40 MG/ML Outcome: tolerated well, no immediate complications Procedure, treatment alternatives, risks and benefits  explained, specific risks discussed. Consent was given by the patient. Immediately prior to procedure a time out was called to verify the correct patient, procedure, equipment, support staff and site/side marked as required. Patient was prepped and draped in the usual sterile fashion.       Clinical Data: No additional findings.  Objective: Vital Signs: There were no vitals taken for this visit.  Physical Exam:  Constitutional: Patient appears well-developed HEENT:  Head: Normocephalic Eyes:EOM are normal Neck: Normal range of motion Cardiovascular: Normal rate Pulmonary/chest: Effort normal Neurologic: Patient is alert Skin: Skin is warm Psychiatric: Patient has normal mood and affect  Ortho Exam: Ortho exam demonstrates bilateral knee range of motion of both 5 to 7 degrees to 105 of flexion.  No effusion in either knee.  Pedal pulses palpable.  No groin pain with internal or external rotation of either leg.  Extensor mechanism intact and nontender.  Specialty Comments:  No specialty comments available.  Imaging: No results found.   PMFS History: Patient Active Problem List   Diagnosis Date Noted   Heart block 11/19/2022   Allergic dermatitis 11/17/2022   Chronic pain of left knee 06/01/2022   Seasonal and perennial allergic rhinoconjunctivitis 10/17/2020   Hiatal hernia 01/31/2018   Hemangioma of liver 01/31/2018   Renal cyst 01/31/2018   Seasonal and perennial allergic rhinitis 06/17/2017   Abdominal mass 04/09/2017   Preventative health care 08/06/2014  Overweight (BMI 25.0-29.9) 02/10/2014   History of colonic polyps 02/10/2014   Gastroesophageal reflux disease 02/10/2014   Seborrheic keratosis 02/10/2014   Asthma, mild intermittent, well-controlled 02/10/2014   History of chicken pox    Mumps    Allergy    Umbilical hernia 02/29/2012   Portal vein thrombosis 03/09/2011   Type 2 diabetes mellitus with obesity (HCC) 11/05/2010   Hyperlipidemia 11/05/2010    Past Medical History:  Diagnosis Date   Allergy    Anemia    h/o   Asthma    Asthma, mild intermittent, well-controlled 02/10/2014   Blood transfusion without reported diagnosis    Chicken pox as a child   Diabetes mellitus    type-II, per Dr. Benjamine, no meds. , pt. reports weight loss has contributed  to control of diabetes.  She reports last HgbA1c- wnl.   Diabetes mellitus 11/05/2010   Diabetes mellitus type 2 in obese 11/05/2010   Diabetes mellitus type 2, uncontrolled 11/05/2010   Esophageal reflux 02/10/2014   GERD (gastroesophageal reflux disease)    rare use of OTC aid   Hyperglycemia 09/02/2015   Hyperlipidemia    Hypertension    Measles as a child   Measles    Medicare annual wellness visit, subsequent 08/06/2014   Mumps as a child   Obesity 02/10/2014   Pneumonia    as a child   Portal vein thrombosis 03/09/2011   coumadin ended summer of 2013   Preventative health care 08/12/2014   Rash and nonspecific skin eruption 01/07/2015   Seborrheic keratosis 02/10/2014   Follows with dermatology   Sinusitis, acute 03/23/2016    Family History  Problem Relation Age of Onset   Cancer Father        prostate   Aneurysm Mother 24       brain   Cancer Maternal Aunt     Past Surgical History:  Procedure Laterality Date   BREAST BIOPSY Bilateral    INSERTION OF MESH  03/16/2012   Procedure: INSERTION OF MESH;  Surgeon: Vicenta DELENA Poli, MD;  Location: [facility] OR;  Service: General;  Laterality: N/A;   KNEE ARTHROSCOPY W/ MENISCAL REPAIR     both knees   Pace maker     TONSILLECTOMY     UMBILICAL HERNIA REPAIR  03/16/2012   Procedure: HERNIA REPAIR UMBILICAL ADULT;  Surgeon: Vicenta DELENA Poli, MD;  Location: [facility] OR;  Service: General;  Laterality: N/A;   Social History   Occupational History   Not on file  Tobacco Use   Smoking status: Never   Smokeless tobacco: Never   Tobacco comments:    never used tobacco  Vaping Use   Vaping status: Never Used  Substance and  Sexual Activity   Alcohol use: Yes    Alcohol/week: 0.0 standard drinks of alcohol    Comment: for holidays   Drug use: No   Sexual activity: Not Currently    Comment: lives alone

## 2023-10-22 NOTE — Telephone Encounter (Signed)
 Auth needed bil gel

## 2023-10-25 NOTE — Telephone Encounter (Signed)
 Called and left a message for patient to CB to schedule for gel injection with Dr. Addie

## 2023-10-25 NOTE — Telephone Encounter (Signed)
 Patient was previously approved for Monovisc, bilateral knee.

## 2023-10-26 ENCOUNTER — Other Ambulatory Visit (HOSPITAL_BASED_OUTPATIENT_CLINIC_OR_DEPARTMENT_OTHER): Payer: Self-pay

## 2023-11-02 ENCOUNTER — Other Ambulatory Visit (HOSPITAL_BASED_OUTPATIENT_CLINIC_OR_DEPARTMENT_OTHER): Payer: Self-pay

## 2023-11-15 DIAGNOSIS — I442 Atrioventricular block, complete: Secondary | ICD-10-CM | POA: Diagnosis not present

## 2023-11-15 DIAGNOSIS — Z950 Presence of cardiac pacemaker: Secondary | ICD-10-CM | POA: Diagnosis not present

## 2024-01-04 DIAGNOSIS — I10 Essential (primary) hypertension: Secondary | ICD-10-CM | POA: Diagnosis not present

## 2024-01-04 DIAGNOSIS — I350 Nonrheumatic aortic (valve) stenosis: Secondary | ICD-10-CM | POA: Diagnosis not present

## 2024-01-05 ENCOUNTER — Ambulatory Visit (INDEPENDENT_AMBULATORY_CARE_PROVIDER_SITE_OTHER): Admitting: *Deleted

## 2024-01-05 ENCOUNTER — Other Ambulatory Visit (INDEPENDENT_AMBULATORY_CARE_PROVIDER_SITE_OTHER)

## 2024-01-05 ENCOUNTER — Ambulatory Visit: Payer: Self-pay | Admitting: Family Medicine

## 2024-01-05 ENCOUNTER — Other Ambulatory Visit (HOSPITAL_BASED_OUTPATIENT_CLINIC_OR_DEPARTMENT_OTHER): Payer: Self-pay

## 2024-01-05 ENCOUNTER — Telehealth: Payer: Self-pay | Admitting: Family Medicine

## 2024-01-05 DIAGNOSIS — Z23 Encounter for immunization: Secondary | ICD-10-CM

## 2024-01-05 DIAGNOSIS — E1169 Type 2 diabetes mellitus with other specified complication: Secondary | ICD-10-CM

## 2024-01-05 DIAGNOSIS — E669 Obesity, unspecified: Secondary | ICD-10-CM

## 2024-01-05 DIAGNOSIS — E782 Mixed hyperlipidemia: Secondary | ICD-10-CM | POA: Diagnosis not present

## 2024-01-05 LAB — CBC WITH DIFFERENTIAL/PLATELET
Basophils Absolute: 0.1 K/uL (ref 0.0–0.1)
Basophils Relative: 1.2 % (ref 0.0–3.0)
Eosinophils Absolute: 0.3 K/uL (ref 0.0–0.7)
Eosinophils Relative: 5.6 % — ABNORMAL HIGH (ref 0.0–5.0)
HCT: 41 % (ref 36.0–46.0)
Hemoglobin: 13.3 g/dL (ref 12.0–15.0)
Lymphocytes Relative: 25.3 % (ref 12.0–46.0)
Lymphs Abs: 1.2 K/uL (ref 0.7–4.0)
MCHC: 32.5 g/dL (ref 30.0–36.0)
MCV: 89.2 fl (ref 78.0–100.0)
Monocytes Absolute: 0.5 K/uL (ref 0.1–1.0)
Monocytes Relative: 10 % (ref 3.0–12.0)
Neutro Abs: 2.8 K/uL (ref 1.4–7.7)
Neutrophils Relative %: 57.9 % (ref 43.0–77.0)
Platelets: 178 K/uL (ref 150.0–400.0)
RBC: 4.59 Mil/uL (ref 3.87–5.11)
RDW: 14.4 % (ref 11.5–15.5)
WBC: 4.8 K/uL (ref 4.0–10.5)

## 2024-01-05 LAB — COMPREHENSIVE METABOLIC PANEL WITH GFR
ALT: 13 U/L (ref 0–35)
AST: 18 U/L (ref 0–37)
Albumin: 4.1 g/dL (ref 3.5–5.2)
Alkaline Phosphatase: 71 U/L (ref 39–117)
BUN: 14 mg/dL (ref 6–23)
CO2: 28 meq/L (ref 19–32)
Calcium: 9.2 mg/dL (ref 8.4–10.5)
Chloride: 103 meq/L (ref 96–112)
Creatinine, Ser: 0.7 mg/dL (ref 0.40–1.20)
GFR: 81.58 mL/min (ref >60.00)
Glucose, Bld: 123 mg/dL — ABNORMAL HIGH (ref 70–99)
Potassium: 4.5 meq/L (ref 3.5–5.1)
Sodium: 139 meq/L (ref 135–145)
Total Bilirubin: 0.5 mg/dL (ref 0.2–1.2)
Total Protein: 6.1 g/dL (ref 6.0–8.3)

## 2024-01-05 LAB — HEMOGLOBIN A1C: Hgb A1c MFr Bld: 7.6 % — ABNORMAL HIGH (ref 4.6–6.5)

## 2024-01-05 LAB — LIPID PANEL
Cholesterol: 159 mg/dL (ref 0–200)
HDL: 66.3 mg/dL (ref >39.00)
LDL Cholesterol: 81 mg/dL (ref 0–99)
NonHDL: 92.52
Total CHOL/HDL Ratio: 2
Triglycerides: 58 mg/dL (ref 0.0–149.0)
VLDL: 11.6 mg/dL (ref 0.0–40.0)

## 2024-01-05 LAB — TSH: TSH: 1.26 u[IU]/mL (ref 0.35–5.50)

## 2024-01-05 NOTE — Telephone Encounter (Signed)
 Pt states she got an echocardiogram done at [facility] yesterday and she wants to know if Dr Domenica can get the results for those before her appt on Monday so she can look over them with her.

## 2024-01-05 NOTE — Addendum Note (Signed)
 Addended by: TRUDY CURVIN RAMAN on: 01/05/2024 08:16 AM   Modules accepted: Orders

## 2024-01-05 NOTE — Progress Notes (Signed)
 Pt in for flu vaccine.    Flu vaccine given in left arm and patient tolerated well.

## 2024-01-05 NOTE — Addendum Note (Signed)
 Addended by: TRUDY CURVIN RAMAN on: 01/05/2024 08:13 AM   Modules accepted: Orders

## 2024-01-06 ENCOUNTER — Other Ambulatory Visit

## 2024-01-07 NOTE — Telephone Encounter (Signed)
 Look under care everywhere to print the echo and only resulted echo in the chart was from 11/13/22.

## 2024-01-09 NOTE — Assessment & Plan Note (Signed)
Avoid offending foods, start probiotics. Do not eat large meals in late evening and consider raising head of bed.  

## 2024-01-09 NOTE — Assessment & Plan Note (Signed)
hgba1c acceptable, minimize simple carbs. Increase exercise as tolerated. Continue current meds. Encouraged DASH or MIND diet, decrease po intake and increase exercise as tolerated. Needs 7-8 hours of sleep nightly. Avoid trans fats, eat small, frequent meals every 4-5 hours with lean proteins, complex carbs and healthy fats. Minimize simple carbs, high fat foods and processed foods

## 2024-01-09 NOTE — Assessment & Plan Note (Signed)
 Encourage heart healthy diet such as MIND or DASH diet, increase exercise, avoid trans fats, simple carbohydrates and processed foods, consider a krill or fish or flaxseed oil cap daily. Tolerating Rosuvastatin

## 2024-01-09 NOTE — Assessment & Plan Note (Signed)
 Echo at [facility]

## 2024-01-10 ENCOUNTER — Other Ambulatory Visit (HOSPITAL_BASED_OUTPATIENT_CLINIC_OR_DEPARTMENT_OTHER): Payer: Self-pay

## 2024-01-10 ENCOUNTER — Ambulatory Visit: Admitting: Family Medicine

## 2024-01-10 ENCOUNTER — Encounter: Payer: Self-pay | Admitting: Family Medicine

## 2024-01-10 VITALS — BP 136/80 | HR 76 | Temp 97.9°F | Resp 16 | Ht 67.0 in | Wt 191.8 lb

## 2024-01-10 DIAGNOSIS — K219 Gastro-esophageal reflux disease without esophagitis: Secondary | ICD-10-CM

## 2024-01-10 DIAGNOSIS — E1169 Type 2 diabetes mellitus with other specified complication: Secondary | ICD-10-CM

## 2024-01-10 DIAGNOSIS — I459 Conduction disorder, unspecified: Secondary | ICD-10-CM | POA: Diagnosis not present

## 2024-01-10 DIAGNOSIS — Z6830 Body mass index (BMI) 30.0-30.9, adult: Secondary | ICD-10-CM

## 2024-01-10 DIAGNOSIS — E782 Mixed hyperlipidemia: Secondary | ICD-10-CM

## 2024-01-10 DIAGNOSIS — E669 Obesity, unspecified: Secondary | ICD-10-CM | POA: Diagnosis not present
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
## 2024-01-10 NOTE — Telephone Encounter (Signed)
 Echo printed off.

## 2024-01-10 NOTE — Patient Instructions (Signed)
 Prevnar 20 vaccine once at pharmacy

## 2024-01-10 NOTE — Telephone Encounter (Signed)
 New echo results has not came in yet.

## 2024-01-11 LAB — MICROALBUMIN / CREATININE URINE RATIO
Creatinine,U: 86.3 mg/dL
Microalb Creat Ratio: UNDETERMINED mg/g (ref 0.0–30.0)
Microalb, Ur: <0.7 mg/dL

## 2024-01-13 ENCOUNTER — Encounter: Payer: Self-pay | Admitting: Family Medicine

## 2024-01-19 ENCOUNTER — Encounter: Payer: Self-pay | Admitting: Family Medicine

## 2024-02-02 ENCOUNTER — Other Ambulatory Visit (HOSPITAL_BASED_OUTPATIENT_CLINIC_OR_DEPARTMENT_OTHER): Payer: Self-pay

## 2024-02-02 ENCOUNTER — Other Ambulatory Visit: Payer: Self-pay

## 2024-02-02 NOTE — Progress Notes (Signed)
 Pharmacy Quality Measure Review  This patient is appearing on a report for being at risk of failing the adherence measure for hypertension (ACEi/ARB) medications this calendar year.   Medication: enalapril  5 mg Last fill date: 11/02/23 for 90 day supply  Spoke to patient and offered to refill enalapril  for her. She states that she has supply on hand, but would appreciate a refill request. Will collaborate with provider to facilitate refill needs.   Jenkins Graces, PharmD PGY1 Pharmacy Resident [phone]

## 2024-02-07 ENCOUNTER — Telehealth: Payer: Self-pay | Admitting: Family Medicine

## 2024-02-07 NOTE — Telephone Encounter (Signed)
 Copied from CRM #[id number]. Topic: Medicare AWV >> Feb 07, 2024 11:57 AM Nathanel DEL wrote: Reason for CRM: Called LVM 02/07/2024 to schedule AWV. Please schedule office or virtual visits  Nathanel Paschal; Care Guide Ambulatory Clinical Support [place] [facility] Direct Dial: [phone]

## 2024-02-09 ENCOUNTER — Encounter: Payer: Self-pay | Admitting: Family Medicine

## 2024-02-14 ENCOUNTER — Other Ambulatory Visit (HOSPITAL_BASED_OUTPATIENT_CLINIC_OR_DEPARTMENT_OTHER): Payer: Self-pay

## 2024-02-14 ENCOUNTER — Encounter: Payer: Self-pay | Admitting: Radiology

## 2024-02-14 DIAGNOSIS — Z950 Presence of cardiac pacemaker: Secondary | ICD-10-CM | POA: Diagnosis not present

## 2024-02-14 DIAGNOSIS — I442 Atrioventricular block, complete: Secondary | ICD-10-CM | POA: Diagnosis not present

## 2024-02-23 ENCOUNTER — Telehealth: Payer: Self-pay | Admitting: *Deleted

## 2024-02-23 ENCOUNTER — Ambulatory Visit

## 2024-02-23 NOTE — Telephone Encounter (Signed)
 Pt scheduled for AWV video visit at 8:20 today. No connection via video with pt and no answer on phone x 2 attempts. Left detailed message to call the office and r/s at earliest convenience. OK for E2C2 to schedule on wellness visit 1 schedule with [facility].

## 2024-03-07 ENCOUNTER — Ambulatory Visit

## 2024-03-15 ENCOUNTER — Ambulatory Visit: Admitting: Orthopedic Surgery

## 2024-03-15 DIAGNOSIS — M1712 Unilateral primary osteoarthritis, left knee: Secondary | ICD-10-CM

## 2024-03-15 DIAGNOSIS — M170 Bilateral primary osteoarthritis of knee: Secondary | ICD-10-CM

## 2024-03-15 DIAGNOSIS — M1711 Unilateral primary osteoarthritis, right knee: Secondary | ICD-10-CM

## 2024-03-17 ENCOUNTER — Encounter: Payer: Self-pay | Admitting: Orthopedic Surgery

## 2024-03-17 NOTE — Progress Notes (Signed)
 Office Visit Note   Patient: Vicki Dennis           Date of Birth: Jul 28, 1945           MRN: [id number] Visit Date: 03/15/2024 Requested by: Domenica Harlene LABOR, MD [id number] [place],  [place] [address] PCP: Domenica Harlene LABOR, MD  Subjective: Chief Complaint  Patient presents with   Left Knee - Pain   Right Knee - Pain    HPI: Vicki Dennis is a 78 y.o. female who presents to the office reporting bilateral knee pain.  Patient had prior injections with relief 10/22/2023.  Has had more pain since.  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  Patient does report swelling with good and bad days.  She is retired runner, broadcasting/film/video.  Takes a lot of vitamins.  She is very active and does her own yard work.  She has had a pacemaker for 2 years.  She lives alone and does care for her own home..                ROS: All systems reviewed are negative as they relate to the chief complaint within the history of present illness.  Patient denies fevers or chills.  Assessment & Plan: Visit Diagnoses: No diagnosis found.  Plan: Impression is progressive bilateral right knee arthritis surprisingly less symptomatic than would be expected based on radiographs and physical exam.  Plan bilateral knee injections today.  Did repeat those 2 times a year and we may also consider gel injections in the future.  Follow-up as needed.  Follow-Up Instructions: No follow-ups on file.   Orders:  No orders of the defined types were placed in this encounter.  No orders of the defined types were placed in this encounter.     Procedures: Large Joint Inj: bilateral knee on 03/15/2024 10:27 PM Indications: diagnostic evaluation, joint swelling and pain Details: 18 G 1.5 in needle, superolateral approach  Arthrogram: No  Medications (Right): 5 mL lidocaine  1 %; 4 mL bupivacaine  0.25 %; 40 mg triamcinolone  acetonide 40 MG/ML Medications (Left): 5 mL lidocaine  1 %; 4 mL  bupivacaine  0.25 %; 40 mg triamcinolone  acetonide 40 MG/ML Outcome: tolerated well, no immediate complications Procedure, treatment alternatives, risks and benefits explained, specific risks discussed. Consent was given by the patient. Immediately prior to procedure a time out was called to verify the correct patient, procedure, equipment, support staff and site/side marked as required. Patient was prepped and draped in the usual sterile fashion.       Clinical Data: No additional findings.  Objective: Vital Signs: There were no vitals taken for this visit.  Physical Exam:  Constitutional: Patient appears well-developed HEENT:  Head: Normocephalic Eyes:EOM are normal Neck: Normal range of motion Cardiovascular: Normal rate Pulmonary/chest: Effort normal Neurologic: Patient is alert Skin: Skin is warm Psychiatric: Patient has normal mood and affect  Ortho Exam: Ortho exam demonstrates range of motion on the right of 7-1 05 left 10-1 05.  Not too much effusion in either knee.  Extensor mechanism intact.  Collateral and cruciate ligaments are stable.  Pedal pulses palpable.  No groin pain with internal or external rotation of either leg.  Specialty Comments:  No specialty comments available.  Imaging: No results found.   PMFS History: Patient Active Problem List   Diagnosis Date Noted   Heart block 11/19/2022   Allergic dermatitis 11/17/2022   Chronic pain of left knee 06/01/2022  Seasonal and perennial allergic rhinoconjunctivitis 10/17/2020   Hiatal hernia 01/31/2018   Hemangioma of liver 01/31/2018   Renal cyst 01/31/2018   Seasonal and perennial allergic rhinitis 06/17/2017   Abdominal mass 04/09/2017   Preventative health care 08/06/2014   Overweight (BMI 25.0-29.9) 02/10/2014   History of colonic polyps 02/10/2014   Gastroesophageal reflux disease 02/10/2014   Seborrheic keratosis 02/10/2014   Asthma, mild intermittent, well-controlled 02/10/2014   History of  chicken pox    Mumps    Allergy    Umbilical hernia 02/29/2012   Portal vein thrombosis 03/09/2011   Type 2 diabetes mellitus with obesity 11/05/2010   Hyperlipidemia 11/05/2010   Past Medical History:  Diagnosis Date   Allergy    Anemia    h/o   Asthma    Asthma, mild intermittent, well-controlled 02/10/2014   Blood transfusion without reported diagnosis    Chicken pox as a child   Diabetes mellitus    type-II, per Dr. Benjamine, no meds. , pt. reports weight loss has contributed  to control of diabetes.  She reports last HgbA1c- wnl.   Diabetes mellitus 11/05/2010   Diabetes mellitus type 2 in obese 11/05/2010   Diabetes mellitus type 2, uncontrolled 11/05/2010   Esophageal reflux 02/10/2014   GERD (gastroesophageal reflux disease)    rare use of OTC aid   Hyperglycemia 09/02/2015   Hyperlipidemia    Hypertension    Measles as a child   Measles    Medicare annual wellness visit, subsequent 08/06/2014   Mumps as a child   Obesity 02/10/2014   Pneumonia    as a child   Portal vein thrombosis 03/09/2011   coumadin ended summer of 2013   Preventative health care 08/12/2014   Rash and nonspecific skin eruption 01/07/2015   Seborrheic keratosis 02/10/2014   Follows with dermatology   Sinusitis, acute 03/23/2016    Family History  Problem Relation Age of Onset   Cancer Father        prostate   Aneurysm Mother 40       brain   Cancer Maternal Aunt     Past Surgical History:  Procedure Laterality Date   BREAST BIOPSY Bilateral    INSERTION OF MESH  03/16/2012   Procedure: INSERTION OF MESH;  Surgeon: Vicenta DELENA Poli, MD;  Location: [facility] OR;  Service: General;  Laterality: N/A;   KNEE ARTHROSCOPY W/ MENISCAL REPAIR     both knees   Pace maker     TONSILLECTOMY     UMBILICAL HERNIA REPAIR  03/16/2012   Procedure: HERNIA REPAIR UMBILICAL ADULT;  Surgeon: Vicenta DELENA Poli, MD;  Location: [facility] OR;  Service: General;  Laterality: N/A;   Social History   Occupational History    Not on file  Tobacco Use   Smoking status: Never   Smokeless tobacco: Never   Tobacco comments:    never used tobacco  Vaping Use   Vaping status: Never Used  Substance and Sexual Activity   Alcohol use: Yes    Alcohol/week: 0.0 standard drinks of alcohol    Comment: for holidays   Drug use: No   Sexual activity: Not Currently    Comment: lives alone

## 2024-03-18 MED ORDER — BUPIVACAINE HCL 0.25 % IJ SOLN
4.0000 mL | INTRAMUSCULAR | Status: AC | PRN
  Administered 2024-03-15: 4 mL via INTRA_ARTICULAR

## 2024-03-18 MED ORDER — LIDOCAINE HCL 1 % IJ SOLN
5.0000 mL | INTRAMUSCULAR | Status: AC | PRN
  Administered 2024-03-15: 5 mL

## 2024-03-18 MED ORDER — TRIAMCINOLONE ACETONIDE 40 MG/ML IJ SUSP
40.0000 mg | INTRAMUSCULAR | Status: AC | PRN
  Administered 2024-03-15: 40 mg via INTRA_ARTICULAR

## 2024-04-14 ENCOUNTER — Telehealth: Payer: Self-pay | Admitting: Family Medicine

## 2024-04-14 NOTE — Telephone Encounter (Signed)
 Copied from CRM [id number]. Topic: Medicare AWV >> Apr 14, 2024 10:14 AM Nathanel DEL wrote: Called LVM 04/14/2024 to sched AWVS. Please schedule AWVS in office.  Nathanel Paschal; Care Guide Ambulatory Clinical Support [place] [facility] Direct Dial: [phone]

## 2024-04-24 ENCOUNTER — Ambulatory Visit (HOSPITAL_BASED_OUTPATIENT_CLINIC_OR_DEPARTMENT_OTHER)
Admission: RE | Admit: 2024-04-24 | Discharge: 2024-04-24 | Disposition: A | Discharge status: Home / Self Care | Source: Ambulatory Visit | Attending: Family Medicine | Admitting: Family Medicine

## 2024-04-24 ENCOUNTER — Other Ambulatory Visit (HOSPITAL_BASED_OUTPATIENT_CLINIC_OR_DEPARTMENT_OTHER): Payer: Self-pay | Admitting: Family Medicine

## 2024-04-24 ENCOUNTER — Ambulatory Visit: Payer: Self-pay | Admitting: Family Medicine

## 2024-04-24 ENCOUNTER — Other Ambulatory Visit (HOSPITAL_BASED_OUTPATIENT_CLINIC_OR_DEPARTMENT_OTHER): Payer: Self-pay

## 2024-04-24 ENCOUNTER — Encounter (HOSPITAL_BASED_OUTPATIENT_CLINIC_OR_DEPARTMENT_OTHER): Payer: Self-pay

## 2024-04-24 ENCOUNTER — Other Ambulatory Visit

## 2024-04-24 DIAGNOSIS — Z1231 Encounter for screening mammogram for malignant neoplasm of breast: Secondary | ICD-10-CM | POA: Diagnosis not present

## 2024-04-24 DIAGNOSIS — K219 Gastro-esophageal reflux disease without esophagitis: Secondary | ICD-10-CM

## 2024-04-24 DIAGNOSIS — E782 Mixed hyperlipidemia: Secondary | ICD-10-CM

## 2024-04-24 DIAGNOSIS — E119 Type 2 diabetes mellitus without complications: Secondary | ICD-10-CM | POA: Diagnosis not present

## 2024-04-24 DIAGNOSIS — E669 Obesity, unspecified: Secondary | ICD-10-CM | POA: Diagnosis not present

## 2024-04-24 LAB — CBC WITH DIFFERENTIAL/PLATELET
Basophils Absolute: 0.1 K/uL (ref 0.0–0.1)
Basophils Relative: 1 % (ref 0.0–3.0)
Eosinophils Absolute: 0.2 K/uL (ref 0.0–0.7)
Eosinophils Relative: 3.2 % (ref 0.0–5.0)
HCT: 40.3 % (ref 36.0–46.0)
Hemoglobin: 13.3 g/dL (ref 12.0–15.0)
Lymphocytes Relative: 22.3 % (ref 12.0–46.0)
Lymphs Abs: 1.6 K/uL (ref 0.7–4.0)
MCHC: 33.1 g/dL (ref 30.0–36.0)
MCV: 88.8 fl (ref 78.0–100.0)
Monocytes Absolute: 0.6 K/uL (ref 0.1–1.0)
Monocytes Relative: 9 % (ref 3.0–12.0)
Neutro Abs: 4.6 K/uL (ref 1.4–7.7)
Neutrophils Relative %: 64.5 % (ref 43.0–77.0)
Platelets: 169 K/uL (ref 150.0–400.0)
RBC: 4.54 Mil/uL (ref 3.87–5.11)
RDW: 15 % (ref 11.5–15.5)
WBC: 7.1 K/uL (ref 4.0–10.5)

## 2024-04-24 LAB — COMPREHENSIVE METABOLIC PANEL WITH GFR
ALT: 11 U/L (ref 3–35)
AST: 17 U/L (ref 5–37)
Albumin: 4.1 g/dL (ref 3.5–5.2)
Alkaline Phosphatase: 71 U/L (ref 39–117)
BUN: 16 mg/dL (ref 6–23)
CO2: 27 meq/L (ref 19–32)
Calcium: 9.2 mg/dL (ref 8.4–10.5)
Chloride: 103 meq/L (ref 96–112)
Creatinine, Ser: 0.7 mg/dL (ref 0.40–1.20)
GFR: 81.4 mL/min
Glucose, Bld: 103 mg/dL — ABNORMAL HIGH (ref 70–99)
Potassium: 4 meq/L (ref 3.5–5.1)
Sodium: 138 meq/L (ref 135–145)
Total Bilirubin: 0.6 mg/dL (ref 0.2–1.2)
Total Protein: 6.2 g/dL (ref 6.0–8.3)

## 2024-04-24 LAB — HEMOGLOBIN A1C: Hgb A1c MFr Bld: 7.2 % — ABNORMAL HIGH (ref 4.6–6.5)

## 2024-04-24 LAB — LIPID PANEL
Cholesterol: 132 mg/dL (ref 28–200)
HDL: 59.7 mg/dL
LDL Cholesterol: 53 mg/dL (ref 10–99)
NonHDL: 72.29
Total CHOL/HDL Ratio: 2
Triglycerides: 96 mg/dL (ref 10.0–149.0)
VLDL: 19.2 mg/dL (ref 0.0–40.0)

## 2024-04-24 LAB — TSH: TSH: 1.59 u[IU]/mL (ref 0.35–5.50)

## 2024-05-12 ENCOUNTER — Encounter: Payer: Self-pay | Admitting: Family Medicine

## 2024-05-15 ENCOUNTER — Ambulatory Visit: Payer: Self-pay

## 2024-05-15 ENCOUNTER — Telehealth: Admitting: Family Medicine

## 2024-05-15 DIAGNOSIS — J0110 Acute frontal sinusitis, unspecified: Secondary | ICD-10-CM

## 2024-05-15 MED ORDER — CLINDAMYCIN HCL 300 MG PO CAPS: 300.0000 mg | ORAL_CAPSULE | Freq: Three times a day (TID) | ORAL | 0 refills | Status: AC

## 2024-05-15 NOTE — Telephone Encounter (Signed)
 FYI Only or Action Required?: FYI only for provider: appointment scheduled on 05/15/24.  Patient was last seen in primary care on 01/10/2024 by Domenica Harlene LABOR, MD.  Called Nurse Triage reporting Chest Pain, Cough, Nasal Congestion, Eye Drainage, and Sinusitis.  Symptoms began several days ago.  Interventions attempted: OTC medications: Tylenol , Mucinex, nasal spray (RN attempted multiple times to get specific answer).  Symptoms are: gradually worsening.  Triage Disposition: See Physician Within 24 Hours (overriding Home Care)  Patient/caregiver understands and will follow disposition?: Yes                  Message from Alfonso ORN sent at 05/15/2024 10:20 AM EST  Reason for Triage: symptoms: stuffy nose, throwing up, eyes stucked together due to mucus , sore throat , chest pain past 2 days   Reason for Disposition  Common cold with no complications  Answer Assessment - Initial Assessment Questions Patient very tangential and mentions her mother speaks to her from the dead. Difficulty getting patient to answer questions directly.  1. ONSET: When did the nasal discharge start?      Few days ago.  2. AMOUNT: How much discharge is there?      Not getting much discharge, mostly stays stopped up  3. COUGH: Do you have a cough? If Yes, ask: Describe the color of your mucus. (e.g., clear, white, yellow, green)     Yes. Yellow.  4. RESPIRATORY DISTRESS: Describe your breathing.      No SOB.  5. FEVER: Do you have a fever? If Yes, ask: What is your temperature, how was it measured, and when did it start?     No fever, reports highest was 99.  6. SEVERITY: Overall, how bad are you feeling right now? (e.g., doesn't interfere with normal activities, staying home from school/work, staying in bed)      Patient unable to answer directly, states she was able to leave the home yesterday.  7. OTHER SYMPTOMS: Do you have any other symptoms? (e.g., earache, mouth  sores, sore throat, wheezing)     Loss of appetite, foul odor to stool, sneezing, watery eyes with gummy discharge, sore throat 3/10, Typical chest pain I get when I come down with a cold/Tightness after coughing or vomiting (cough induces the vomiting and she states she is vomiting up mucous).  Protocols used: Common Cold-A-AH

## 2024-05-18 ENCOUNTER — Ambulatory Visit: Admitting: Family Medicine

## 2024-07-19 ENCOUNTER — Ambulatory Visit: Admitting: Orthopedic Surgery

## 2025-01-16 ENCOUNTER — Encounter: Admitting: Family Medicine
